# Patient Record
Sex: Female | Born: 1955 | Race: Black or African American | Hispanic: No | Smoking: Current every day smoker
Health system: Southern US, Community
[De-identification: ages and names within clinical notes are randomized; demographics above are authoritative.]

## PROBLEM LIST (undated history)

## (undated) DIAGNOSIS — I252 Old myocardial infarction: Secondary | ICD-10-CM

## (undated) DIAGNOSIS — F32A Depression, unspecified: Secondary | ICD-10-CM

## (undated) DIAGNOSIS — I255 Ischemic cardiomyopathy: Secondary | ICD-10-CM

## (undated) DIAGNOSIS — F1491 Cocaine use, unspecified, in remission: Secondary | ICD-10-CM

## (undated) DIAGNOSIS — B009 Herpesviral infection, unspecified: Secondary | ICD-10-CM

## (undated) DIAGNOSIS — F329 Major depressive disorder, single episode, unspecified: Secondary | ICD-10-CM

## (undated) DIAGNOSIS — Z87898 Personal history of other specified conditions: Secondary | ICD-10-CM

## (undated) DIAGNOSIS — Z9861 Coronary angioplasty status: Secondary | ICD-10-CM

## (undated) DIAGNOSIS — F1010 Alcohol abuse, uncomplicated: Secondary | ICD-10-CM

## (undated) DIAGNOSIS — I2510 Atherosclerotic heart disease of native coronary artery without angina pectoris: Secondary | ICD-10-CM

## (undated) DIAGNOSIS — I10 Essential (primary) hypertension: Secondary | ICD-10-CM

## (undated) DIAGNOSIS — K219 Gastro-esophageal reflux disease without esophagitis: Secondary | ICD-10-CM

## (undated) DIAGNOSIS — E785 Hyperlipidemia, unspecified: Secondary | ICD-10-CM

## (undated) DIAGNOSIS — J189 Pneumonia, unspecified organism: Secondary | ICD-10-CM

## (undated) HISTORY — DX: Alcohol abuse, uncomplicated: F10.10

## (undated) HISTORY — DX: Herpesviral infection, unspecified: B00.9

## (undated) HISTORY — DX: Essential (primary) hypertension: I10

## (undated) HISTORY — DX: Personal history of other specified conditions: Z87.898

## (undated) HISTORY — DX: Cocaine use, unspecified, in remission: F14.91

---

## 1981-04-16 HISTORY — PX: TUBAL LIGATION: SHX77

## 2010-03-14 ENCOUNTER — Emergency Department (HOSPITAL_COMMUNITY)
Admission: EM | Admit: 2010-03-14 | Discharge: 2010-03-14 | Payer: Self-pay | Source: Home / Self Care | Admitting: Emergency Medicine

## 2010-10-26 ENCOUNTER — Encounter: Payer: Self-pay | Admitting: *Deleted

## 2010-10-26 ENCOUNTER — Emergency Department (HOSPITAL_COMMUNITY)
Admission: EM | Admit: 2010-10-26 | Discharge: 2010-10-26 | Disposition: A | Discharge status: Home / Self Care | Payer: Self-pay | Attending: Emergency Medicine | Admitting: Emergency Medicine

## 2010-10-26 DIAGNOSIS — F17200 Nicotine dependence, unspecified, uncomplicated: Secondary | ICD-10-CM | POA: Insufficient documentation

## 2010-10-26 DIAGNOSIS — N949 Unspecified condition associated with female genital organs and menstrual cycle: Secondary | ICD-10-CM

## 2010-10-26 DIAGNOSIS — N898 Other specified noninflammatory disorders of vagina: Secondary | ICD-10-CM | POA: Insufficient documentation

## 2010-10-26 DIAGNOSIS — I10 Essential (primary) hypertension: Secondary | ICD-10-CM | POA: Insufficient documentation

## 2010-10-26 LAB — URINALYSIS, ROUTINE W REFLEX MICROSCOPIC
Glucose, UA: NEGATIVE mg/dL
Leukocytes, UA: NEGATIVE
Protein, ur: NEGATIVE mg/dL
Specific Gravity, Urine: 1.02 (ref 1.005–1.030)
pH: 5.5 (ref 5.0–8.0)

## 2010-10-26 LAB — BASIC METABOLIC PANEL
CO2: 27 mEq/L (ref 19–32)
Calcium: 9.5 mg/dL (ref 8.4–10.5)
Chloride: 102 mEq/L (ref 96–112)
Glucose, Bld: 85 mg/dL (ref 70–99)
Potassium: 3.8 mEq/L (ref 3.5–5.1)
Sodium: 139 mEq/L (ref 135–145)

## 2010-10-26 LAB — CBC
Hemoglobin: 13.9 g/dL (ref 12.0–15.0)
Platelets: 193 10*3/uL (ref 150–400)
RBC: 4.3 MIL/uL (ref 3.87–5.11)
WBC: 4.8 10*3/uL (ref 4.0–10.5)

## 2010-10-26 LAB — URINE MICROSCOPIC-ADD ON

## 2010-10-26 LAB — POCT PREGNANCY, URINE: Preg Test, Ur: NEGATIVE

## 2010-10-26 MED ORDER — SODIUM CHLORIDE 0.9 % IJ SOLN
INTRAMUSCULAR | Status: AC
  Administered 2010-10-26: 10:00:00
  Filled 2010-10-26: qty 10

## 2010-10-26 MED ORDER — HYDROCHLOROTHIAZIDE 25 MG PO TABS: 25.0000 mg | ORAL_TABLET | Freq: Every day | ORAL | Status: DC

## 2010-10-26 NOTE — ED Notes (Signed)
Vaginal bright red bleeding x 2 days.  Denies bleeding at this time.  States bleeding is abnormal.  Also, states had friend check bp x 2 days ago and was elevated.  Has hx of htn but does not take meds.

## 2010-10-26 NOTE — ED Notes (Signed)
An in and out was performed. Straight quick cath, clear clean specimen. Performed by Stephens Shire NT assisted by Waynetta Sandy NT.

## 2010-10-26 NOTE — ED Provider Notes (Signed)
History     Chief Complaint  Patient presents with   Hypertension   Vaginal Bleeding   HPI  Past Medical History  Diagnosis Date   Hypertension     Past Surgical History  Procedure Date   Tubal ligation     History reviewed. No pertinent family history.  History  Substance Use Topics   Smoking status: Current Everyday Smoker -- 0.5 packs/day    Types: Cigarettes   Smokeless tobacco: Not on file   Alcohol Use: Yes     occasional    OB History    Grav Para Term Preterm Abortions TAB SAB Ect Mult Living                  Review of Systems  Physical Exam  BP 181/82   Pulse 60   Temp(Src) 98.2 F (36.8 C) (Oral)   Resp 18   Ht 4\' 11"  (1.499 m)   Wt 105 lb (47.628 kg)   BMI 21.21 kg/m2   SpO2 99%  Physical Exam  ED Course  Procedures  MDM Pt seen by Dr. Adriana Simas; c/o BP problem because ran out of meds, reports high BP measured 2 days ago, unsure of exact numbers. Also c/o small amount vaginal bleeding for a few days which is unusual for pt. No abd pain or back pain.

## 2010-10-26 NOTE — ED Provider Notes (Signed)
History     Chief Complaint  Patient presents with  . Hypertension  . Vaginal Bleeding   Patient is a 55 y.o. female presenting with hypertension and vaginal bleeding. The history is provided by the patient.  Hypertension This is a chronic problem. The problem occurs daily. The problem has been gradually worsening. Associated symptoms include headaches, nausea and vertigo. Pertinent negatives include no abdominal pain, arthralgias, chest pain, coughing or neck pain. She has tried nothing for the symptoms.  Vaginal Bleeding Associated symptoms include headaches, nausea and vertigo. Pertinent negatives include no abdominal pain, arthralgias, chest pain, coughing or neck pain.  Hypertension This is a chronic problem. The problem occurs daily. The problem has been gradually worsening. Associated symptoms include headaches. Pertinent negatives include no chest pain, no abdominal pain and no shortness of breath. She has tried nothing for the symptoms.  Vaginal Bleeding Associated symptoms include headaches. Pertinent negatives include no chest pain, no abdominal pain and no shortness of breath.    Past Medical History  Diagnosis Date  . Hypertension     Past Surgical History  Procedure Date  . Tubal ligation     History reviewed. No pertinent family history.  History  Substance Use Topics  . Smoking status: Current Everyday Smoker -- 0.5 packs/day    Types: Cigarettes  . Smokeless tobacco: Not on file  . Alcohol Use: Yes     occasional    OB History    Grav Para Term Preterm Abortions TAB SAB Ect Mult Living                  Review of Systems  Constitutional: Negative for activity change.       All ROS Neg except as noted in HPI  HENT: Negative for nosebleeds and neck pain.   Eyes: Negative for photophobia and discharge.  Respiratory: Negative for cough, shortness of breath and wheezing.   Cardiovascular: Negative for chest pain and palpitations.  Gastrointestinal:  Positive for nausea. Negative for abdominal pain and blood in stool.  Genitourinary: Positive for vaginal bleeding. Negative for dysuria, frequency and hematuria.  Musculoskeletal: Negative for back pain and arthralgias.  Skin: Negative.   Neurological: Positive for dizziness, vertigo and headaches. Negative for seizures and speech difficulty.  Hematological: Does not bruise/bleed easily.  Psychiatric/Behavioral: Negative for hallucinations and confusion.    Physical Exam  BP 191/114  Pulse 70  Temp(Src) 98.2 F (36.8 C) (Oral)  Resp 18  Ht 4\' 11"  (1.499 m)  Wt 105 lb (47.628 kg)  BMI 21.21 kg/m2  SpO2 100%  Physical Exam  Nursing note and vitals reviewed. Constitutional: She is oriented to person, place, and time. She appears well-developed and well-nourished.  Non-toxic appearance.  HENT:  Head: Normocephalic.  Right Ear: Tympanic membrane and external ear normal.  Left Ear: Tympanic membrane and external ear normal.  Eyes: EOM and lids are normal. Pupils are equal, round, and reactive to light.  Neck: Normal range of motion. Neck supple. Carotid bruit is not present.  Cardiovascular: Normal rate, regular rhythm, normal heart sounds, intact distal pulses and normal pulses.   Pulmonary/Chest: Breath sounds normal. No respiratory distress.  Abdominal: Soft. Bowel sounds are normal. There is no tenderness. There is no guarding.  Genitourinary:       No vaginal bleeding at this time  Musculoskeletal: Normal range of motion.  Lymphadenopathy:       Head (right side): No submandibular adenopathy present.       Head (left  side): No submandibular adenopathy present.    She has no cervical adenopathy.  Neurological: She is alert and oriented to person, place, and time. She has normal strength. No cranial nerve deficit or sensory deficit. Coordination normal.       Speech clear. Grip symmetrical.   Skin: Skin is warm and dry.  Psychiatric: She has a normal mood and affect. Her  speech is normal.    ED Course  Procedures  I have reviewed nursing notes, vital signs, and all appropriate lab and imaging results for this patient.      Kathie Dike, [place] 10/27/10 2136  Donnetta Hutching, MD 11/24/10 1149

## 2010-10-26 NOTE — ED Provider Notes (Signed)
History     Chief Complaint  Patient presents with  . Hypertension  . Vaginal Bleeding   HPI  Past Medical History  Diagnosis Date  . Hypertension     Past Surgical History  Procedure Date  . Tubal ligation     History reviewed. No pertinent family history.  History  Substance Use Topics  . Smoking status: Current Everyday Smoker -- 0.5 packs/day    Types: Cigarettes  . Smokeless tobacco: Not on file  . Alcohol Use: Yes     occasional    OB History    Grav Para Term Preterm Abortions TAB SAB Ect Mult Living                  Review of Systems  Physical Exam  BP 181/82  Pulse 60  Temp(Src) 98.2 F (36.8 C) (Oral)  Resp 18  Ht 4\' 11"  (1.499 m)  Wt 105 lb (47.628 kg)  BMI 21.21 kg/m2  SpO2 99%  Physical Exam  ED Course  Procedures  MDM See below     I performed a history and physical examination of Joanna Farmer and discussed her management with Dr. .  I agree with the history, physical, assessment, and plan of care, with the following exceptions: None  I was present for the following procedures: None Time Spent in Critical Care of the patient: None Time spent in discussions with the patient and family: 5 min  Sherman Donaldson Seen by me:  bp noted to be elevated;  Also c/o min vag bleeding.  Hemodynamically elevated;  No neuro def;  No acute abd;  D/c info per PA  Donnetta Hutching, MD 11/24/10 1425

## 2011-07-10 ENCOUNTER — Encounter (HOSPITAL_COMMUNITY): Payer: Self-pay | Admitting: *Deleted

## 2011-07-10 ENCOUNTER — Emergency Department (HOSPITAL_COMMUNITY)
Admission: EM | Admit: 2011-07-10 | Discharge: 2011-07-10 | Disposition: A | Discharge status: Home / Self Care | Payer: Self-pay | Attending: Emergency Medicine | Admitting: Emergency Medicine

## 2011-07-10 DIAGNOSIS — R51 Headache: Secondary | ICD-10-CM | POA: Insufficient documentation

## 2011-07-10 DIAGNOSIS — M545 Low back pain, unspecified: Secondary | ICD-10-CM | POA: Insufficient documentation

## 2011-07-10 DIAGNOSIS — G8929 Other chronic pain: Secondary | ICD-10-CM | POA: Insufficient documentation

## 2011-07-10 DIAGNOSIS — I10 Essential (primary) hypertension: Secondary | ICD-10-CM | POA: Insufficient documentation

## 2011-07-10 DIAGNOSIS — F17200 Nicotine dependence, unspecified, uncomplicated: Secondary | ICD-10-CM | POA: Insufficient documentation

## 2011-07-10 MED ORDER — HYDROCODONE-ACETAMINOPHEN 5-325 MG PO TABS
2.0000 | ORAL_TABLET | Freq: Once | ORAL | Status: AC
  Administered 2011-07-10: 2 via ORAL
  Filled 2011-07-10: qty 2

## 2011-07-10 MED ORDER — HYDROCODONE-ACETAMINOPHEN 5-325 MG PO TABS: 1.0000 | ORAL_TABLET | ORAL | Status: AC | PRN

## 2011-07-10 MED ORDER — ONDANSETRON HCL 4 MG PO TABS
4.0000 mg | ORAL_TABLET | Freq: Once | ORAL | Status: AC
  Administered 2011-07-10: 4 mg via ORAL
  Filled 2011-07-10: qty 1

## 2011-07-10 MED ORDER — HYDROCHLOROTHIAZIDE 12.5 MG PO TABS: 25.0000 mg | ORAL_TABLET | Freq: Every day | ORAL | Status: DC

## 2011-07-10 NOTE — ED Provider Notes (Signed)
History     CSN: [id number]  Arrival date & time 07/10/11  1116   None     Chief Complaint  Patient presents with  . Back Pain  . Headache    (Consider location/radiation/quality/duration/timing/severity/associated sxs/prior treatment) Patient is a 56 y.o. female presenting with back pain and headaches. The history is provided by the patient.  Back Pain  This is a chronic problem. The problem occurs daily. The problem has been gradually worsening. The pain is associated with no known injury. The pain is present in the lumbar spine. The quality of the pain is described as aching. The pain radiates to the right thigh. The pain is severe. The symptoms are aggravated by certain positions. The pain is the same all the time. Associated symptoms include headaches. Pertinent negatives include no chest pain, no numbness, no abdominal pain, no bowel incontinence, no perianal numbness, no bladder incontinence and no dysuria. She has tried nothing for the symptoms.  Headache  Pertinent negatives include no palpitations and no shortness of breath.    Past Medical History  Diagnosis Date  . Hypertension     Past Surgical History  Procedure Date  . Tubal ligation     No family history on file.  History  Substance Use Topics  . Smoking status: Current Everyday Smoker -- 0.5 packs/day    Types: Cigarettes  . Smokeless tobacco: Not on file  . Alcohol Use: Yes     occasional    OB History    Grav Para Term Preterm Abortions TAB SAB Ect Mult Living                  Review of Systems  Constitutional: Negative for activity change.       All ROS Neg except as noted in HPI  HENT: Negative for nosebleeds and neck pain.   Eyes: Negative for photophobia and discharge.  Respiratory: Negative for cough, shortness of breath and wheezing.   Cardiovascular: Negative for chest pain and palpitations.  Gastrointestinal: Negative for abdominal pain, blood in stool and bowel incontinence.    Genitourinary: Negative for bladder incontinence, dysuria, frequency and hematuria.  Musculoskeletal: Positive for back pain. Negative for arthralgias.  Skin: Negative.   Neurological: Positive for headaches. Negative for dizziness, seizures, speech difficulty and numbness.  Psychiatric/Behavioral: Negative for hallucinations and confusion.    Allergies  Review of patient's allergies indicates no known allergies.  Home Medications  No current outpatient prescriptions on file.  BP 189/105  Pulse 98  Temp(Src) 98.2 F (36.8 C) (Oral)  Resp 16  Ht 4\' 11"  (1.499 m)  Wt 102 lb (46.267 kg)  BMI 20.60 kg/m2  SpO2 98%  Physical Exam  Nursing note and vitals reviewed. Constitutional: She is oriented to person, place, and time. She appears well-developed and well-nourished.  Non-toxic appearance.  HENT:  Head: Normocephalic.  Right Ear: Tympanic membrane and external ear normal.  Left Ear: Tympanic membrane and external ear normal.  Eyes: EOM and lids are normal. Pupils are equal, round, and reactive to light.  Neck: Normal range of motion. Neck supple. Carotid bruit is not present.  Cardiovascular: Normal rate, regular rhythm, normal heart sounds, intact distal pulses and normal pulses.   Pulmonary/Chest: Breath sounds normal. No respiratory distress.  Abdominal: Soft. Bowel sounds are normal. There is no tenderness. There is no guarding.  Musculoskeletal: Normal range of motion.       There is pain to palpation from the mid back to the lower lumbar  area. A right area of pain is greater than left. There is pain also to attempted range of motion of the lower back.  Lymphadenopathy:       Head (right side): No submandibular adenopathy present.       Head (left side): No submandibular adenopathy present.    She has no cervical adenopathy.  Neurological: She is alert and oriented to person, place, and time. She has normal strength. No cranial nerve deficit or sensory deficit. She  exhibits normal muscle tone. Coordination normal.  Skin: Skin is warm and dry.  Psychiatric: She has a normal mood and affect. Her speech is normal.    ED Course  Procedures (including critical care time)  Labs Reviewed - No data to display No results found.   No diagnosis found.    MDM  I have reviewed nursing notes, vital signs, and all appropriate lab and imaging results for this patient. Patient has a long-term history of lower back pain. Over the last 2 weeks the pain has gotten worse. Patient also expressed complaining of a headache that is" off and on during the day. The patient has a history of hypertension but states she cannot afford her medication and has not taken this medication" and while". No gross neurologic changes on examination today. Patient treated with Norco 5 mg, one or 2 every 4 hours as needed for pain #15.      Kathie Dike, [place] 07/10/11 1300

## 2011-07-10 NOTE — ED Notes (Signed)
Patient c/o intermittent headache, lower back pain x 2 weeks and left leg pain x 2 weeks. Describes leg pain as a numbness. Denies injury to back. HTN noted, patient not taking BP meds.

## 2011-07-10 NOTE — ED Provider Notes (Signed)
Medical screening examination/treatment/procedure(s) were performed by non-physician practitioner and as supervising physician I was immediately available for consultation/collaboration. Angelys Yetman, MD, FACEP   Monta Maiorana L Kien Mirsky, MD 07/10/11 2011 

## 2011-07-10 NOTE — ED Notes (Signed)
Patient with no complaints at this time. Respirations even and unlabored. Skin warm/dry. Discharge instructions reviewed with patient at this time. Patient given opportunity to voice concerns/ask questions. Patient discharged at this time and left Emergency Department with steady gait.   

## 2011-07-10 NOTE — Discharge Instructions (Signed)
Chronic Back Pain When back pain lasts longer than 3 months, it is called chronic back pain.This pain can be frustrating, but the cause of the pain is rarely dangerous.People with chronic back pain often go through certain periods that are more intense (flare-ups). CAUSES Chronic back pain can be caused by wear and tear (degeneration) on different structures in your back. These structures may include bones, ligaments, or discs. This degeneration may result in more pressure being placed on the nerves that travel to your legs and feet. This can lead to pain traveling from the low back down the back of the legs. When pain lasts longer than 3 months, it is not unusual for people to experience anxiety or depression. Anxiety and depression can also contribute to low back pain. TREATMENT  Establish a regular exercise plan. This is critical to improving your functional level.   Have a self-management plan for when you flare-up. Flare-ups rarely require a medical visit. Regular exercise will help reduce the intensity and frequency of your flare-ups.   Manage how you feel about your back pain and the rest of your life. Anxiety, depression, and feeling that you cannot alter your back pain have been shown to make back pain more intense and debilitating.   Medicines should never be your only treatment. They should be used along with other treatments to help you return to a more active lifestyle.   Procedures such as injections or surgery may be helpful but are rarely necessary. You may be able to get the same results with physical therapy or chiropractic care.  HOME CARE INSTRUCTIONS  Avoid bending, heavy lifting, prolonged sitting, and activities which make the problem worse.   Continue normal activity as much as possible.   Take brief periods of rest throughout the day to reduce your pain during flare-ups.   Follow your back exercise rehabilitation program. This can help reduce symptoms and prevent  more pain.   Only take over-the-counter or prescription medicines as directed by your caregiver. Muscle relaxants are sometimes prescribed. Narcotic pain medicine is discouraged for long-term pain, since addiction is a possible outcome.   If you smoke, quit.   Eat healthy foods and maintain a recommended body weight.  SEEK IMMEDIATE MEDICAL CARE IF:   You have weakness or numbness in one of your legs or feet.   You have trouble controlling your bladder or bowels.   You develop nausea, vomiting, abdominal pain, shortness of breath, or fainting.  Document Released: 05/10/2004 Document Revised: 03/22/2011 Document Reviewed: 03/17/2011 [facility] Patient Information 2012 [place], [place].Headache Headaches are caused by many different problems. Most commonly, headache is caused by muscle tension from an injury, fatigue, or emotional upset. Excessive muscle contractions in the scalp and neck result in a headache that often feels like a tight band around the head. Tension headaches often have areas of tenderness over the scalp and the back of the neck. These headaches may last for hours, days, or longer, and some may contribute to migraines in those who have migraine problems. Migraines usually cause a throbbing headache, which is made worse by activity. Sometimes only one side of the head hurts. Nausea, vomiting, eye pain, and avoidance of food are common with migraines. Visual symptoms such as light sensitivity, blind spots, or flashing lights may also occur. Loud noises may worsen migraine headaches. Many factors may cause migraine headaches:  Emotional stress, lack of sleep, and menstrual periods.   Alcohol and some drugs (such as birth control pills).  Diet factors (fasting, caffeine, food preservatives, chocolate).   Environmental factors (weather changes, bright lights, odors, smoke).  Other causes of headaches include minor injuries to the head. Arthritis in the neck; problems with the jaw,  eyes, ears, or nose are also causes of headaches. Allergies, drugs, alcohol, and exposure to smoke can also cause moderate headaches. Rebound headaches can occur if someone uses pain medications for a long period of time and then stops. Less commonly, blood vessel problems in the neck and brain (including stroke) can cause various types of headache. Treatment of headaches includes medicines for pain and relaxation. Ice packs or heat applied to the back of the head and neck help some people. Massaging the shoulders, neck and scalp are often very useful. Relaxation techniques and stretching can help prevent these headaches. Avoid alcohol and cigarette smoking as these tend to make headaches worse. Please see your caregiver if your headache is not better in 2 days.  SEEK IMMEDIATE MEDICAL CARE IF:   You develop a high fever, chills, or repeated vomiting.   You faint or have difficulty with vision.   You develop unusual numbness or weakness of your arms or legs.   Relief of pain is inadequate with medication, or you develop severe pain.   You develop confusion, or neck stiffness.   You have a worsening of a headache or do not obtain relief.  Document Released: 04/02/2005 Document Revised: 03/22/2011 Document Reviewed: 09/26/2006 [facility] Patient Information 2012 [place], [place].Hypertension Hypertension is another name for high blood pressure. High blood pressure may mean that your heart needs to work harder to pump blood. Blood pressure consists of two numbers, which includes a higher number over a lower number (example: 110/72). HOME CARE   Make lifestyle changes as told by your doctor. This may include weight loss and exercise.   Take your blood pressure medicine every day.   Limit how much salt you use.   Stop smoking if you smoke.   Do not use drugs.   Talk to your doctor if you are using decongestants or birth control pills. These medicines might make blood pressure higher.    Females should not drink more than 1 alcoholic drink per day. Males should not drink more than 2 alcoholic drinks per day.   See your doctor as told.  GET HELP RIGHT AWAY IF:   You have a blood pressure reading with a top number of 180 or higher.   You get a very bad headache.   You get blurred or changing vision.   You feel confused.   You feel weak, numb, or faint.   You get chest or belly (abdominal) pain.   You throw up (vomit).   You cannot breathe very well.  MAKE SURE YOU:   Understand these instructions.   Will watch your condition.   Will get help right away if you are not doing well or get worse.  Document Released: 09/19/2007 Document Revised: 03/22/2011 Document Reviewed: 09/19/2007 [facility] Patient Information 2012 [place], [place].

## 2011-07-10 NOTE — ED Notes (Signed)
Pt presents with lower right back pain that radiates into leg x 2 weeks. Also c/o headache "off and on for days". Denies fall/injury, vision changes and weakness. Pt states has hypertension and cannot afford medication.

## 2013-04-16 HISTORY — PX: CORONARY ANGIOPLASTY WITH STENT PLACEMENT: SHX49

## 2013-12-30 ENCOUNTER — Emergency Department (HOSPITAL_COMMUNITY)
Admission: EM | Admit: 2013-12-30 | Discharge: 2013-12-30 | Disposition: A | Discharge status: Other Acute Inpatient Hospital | Payer: Self-pay | Attending: Emergency Medicine | Admitting: Emergency Medicine

## 2013-12-30 ENCOUNTER — Encounter (HOSPITAL_COMMUNITY): Payer: Self-pay | Admitting: Emergency Medicine

## 2013-12-30 ENCOUNTER — Emergency Department (HOSPITAL_COMMUNITY): Payer: Self-pay

## 2013-12-30 DIAGNOSIS — Z8659 Personal history of other mental and behavioral disorders: Secondary | ICD-10-CM | POA: Insufficient documentation

## 2013-12-30 DIAGNOSIS — Z008 Encounter for other general examination: Secondary | ICD-10-CM | POA: Insufficient documentation

## 2013-12-30 DIAGNOSIS — F1010 Alcohol abuse, uncomplicated: Secondary | ICD-10-CM | POA: Insufficient documentation

## 2013-12-30 DIAGNOSIS — I10 Essential (primary) hypertension: Secondary | ICD-10-CM | POA: Insufficient documentation

## 2013-12-30 DIAGNOSIS — F17200 Nicotine dependence, unspecified, uncomplicated: Secondary | ICD-10-CM | POA: Insufficient documentation

## 2013-12-30 DIAGNOSIS — F1410 Cocaine abuse, uncomplicated: Secondary | ICD-10-CM | POA: Insufficient documentation

## 2013-12-30 DIAGNOSIS — F39 Unspecified mood [affective] disorder: Secondary | ICD-10-CM | POA: Insufficient documentation

## 2013-12-30 HISTORY — DX: Depression, unspecified: F32.A

## 2013-12-30 HISTORY — DX: Major depressive disorder, single episode, unspecified: F32.9

## 2013-12-30 LAB — CBC WITH DIFFERENTIAL/PLATELET
BASOS ABS: 0 10*3/uL (ref 0.0–0.1)
Basophils Relative: 0 % (ref 0–1)
Eosinophils Absolute: 0 10*3/uL (ref 0.0–0.7)
Eosinophils Relative: 1 % (ref 0–5)
HCT: 38.5 % (ref 36.0–46.0)
Hemoglobin: 12.9 g/dL (ref 12.0–15.0)
LYMPHS ABS: 2 10*3/uL (ref 0.7–4.0)
LYMPHS PCT: 45 % (ref 12–46)
MCH: 31.5 pg (ref 26.0–34.0)
MCHC: 33.5 g/dL (ref 30.0–36.0)
MCV: 93.9 fL (ref 78.0–100.0)
Monocytes Absolute: 0.3 10*3/uL (ref 0.1–1.0)
Monocytes Relative: 8 % (ref 3–12)
NEUTROS ABS: 2.1 10*3/uL (ref 1.7–7.7)
Neutrophils Relative %: 46 % (ref 43–77)
PLATELETS: 218 10*3/uL (ref 150–400)
RBC: 4.1 MIL/uL (ref 3.87–5.11)
RDW: 13.9 % (ref 11.5–15.5)
WBC: 4.4 10*3/uL (ref 4.0–10.5)

## 2013-12-30 LAB — URINE MICROSCOPIC-ADD ON

## 2013-12-30 LAB — BASIC METABOLIC PANEL
ANION GAP: 16 — AB (ref 5–15)
BUN: 16 mg/dL (ref 6–23)
CHLORIDE: 106 meq/L (ref 96–112)
CO2: 24 meq/L (ref 19–32)
Calcium: 9.3 mg/dL (ref 8.4–10.5)
Creatinine, Ser: 0.98 mg/dL (ref 0.50–1.10)
GFR calc Af Amer: 72 mL/min — ABNORMAL LOW (ref >90)
GFR calc non Af Amer: 62 mL/min — ABNORMAL LOW (ref >90)
Glucose, Bld: 150 mg/dL — ABNORMAL HIGH (ref 70–99)
POTASSIUM: 3.6 meq/L — AB (ref 3.7–5.3)
SODIUM: 146 meq/L (ref 137–147)

## 2013-12-30 LAB — RAPID URINE DRUG SCREEN, HOSP PERFORMED
AMPHETAMINES: NOT DETECTED
BARBITURATES: NOT DETECTED
BENZODIAZEPINES: NOT DETECTED
Cocaine: POSITIVE — AB
Opiates: NOT DETECTED
Tetrahydrocannabinol: NOT DETECTED

## 2013-12-30 LAB — URINALYSIS, ROUTINE W REFLEX MICROSCOPIC
BILIRUBIN URINE: NEGATIVE
GLUCOSE, UA: NEGATIVE mg/dL
Hgb urine dipstick: NEGATIVE
KETONES UR: NEGATIVE mg/dL
NITRITE: NEGATIVE
PH: 6 (ref 5.0–8.0)
PROTEIN: NEGATIVE mg/dL
Specific Gravity, Urine: <1.005 — ABNORMAL LOW (ref 1.005–1.030)
Urobilinogen, UA: 0.2 mg/dL (ref 0.0–1.0)

## 2013-12-30 LAB — ETHANOL: ALCOHOL ETHYL (B): 147 mg/dL — AB (ref 0–11)

## 2013-12-30 LAB — TROPONIN I

## 2013-12-30 MED ORDER — CLONIDINE HCL 0.1 MG PO TABS
0.1000 mg | ORAL_TABLET | Freq: Once | ORAL | Status: AC
  Administered 2013-12-30: 0.1 mg via ORAL
  Filled 2013-12-30: qty 1

## 2013-12-30 NOTE — ED Notes (Signed)
TTS in progress 

## 2013-12-30 NOTE — ED Notes (Signed)
[facility] notified of need to transport pt.

## 2013-12-30 NOTE — BH Assessment (Signed)
Tele Assessment Note   Joanna Farmer is a 58 y.o. female who voluntarily presents to [facility] for alcohol/cocaine/thc detox.  Pt denies SI/HI/AVH, however she admits past SI attempt by cutting her wrists(10 yrs ago).  Pt reports she is consuming 15+ 16-24oz beers, daily,  Her last drink(1-16oz) was today.  Pt uses $40 worth of cocaine at least 2x's a week and last use was on 12/27/13 and she occasionally uses 1 marijuana joint. She last used marijuana was 12/27/13.  Pt denies any seizure/blackout activity, no legal issues.       Axis I: Alcohol Use D/O, Severe; Cocaine Use D/O; Cannabis Use D/O  Axis II: Deferred Axis III:  Past Medical History  Diagnosis Date  . Hypertension   . Depression    Axis IV: other psychosocial or environmental problems, problems related to social environment and problems with primary support group Axis V: 51-60 moderate symptoms  Past Medical History:  Past Medical History  Diagnosis Date  . Hypertension   . Depression     Past Surgical History  Procedure Laterality Date  . Tubal ligation      Family History: No family history on file.  Social History:  reports that she has been smoking Cigarettes.  She has been smoking about 0.50 packs per day. She does not have any smokeless tobacco history on file. She reports that she drinks alcohol. She reports that she uses illicit drugs (Marijuana and Cocaine).  Additional Social History:  Alcohol / Drug Use Pain Medications: None  Prescriptions: None  Over the Counter: None  History of alcohol / drug use?: Yes Longest period of sobriety (when/how long): No previous detox  Negative Consequences of Use: Work / School;Personal relationships;Financial Withdrawal Symptoms: Other (Comment) (No current w/d sxs ) Substance #1 Name of Substance 1: Alcohol  1 - Age of First Use: Teens  1 - Amount (size/oz): 15+ 16-24oz Beers  1 - Frequency: Daily  1 - Duration: On-going  1 - Last Use / Amount: 12/30/13 Substance  #2 Name of Substance 2: Cocaine  2 - Age of First Use: 20's  2 - Amount (size/oz): $40 2 - Frequency: 2x's Wkly  2 - Duration: On-going  2 - Last Use / Amount: 12/27/13 Substance #3 Name of Substance 3: THC  3 - Age of First Use: Teens  3 - Amount (size/oz): 1 Joint  3 - Frequency: Occasional  3 - Duration: On-going  3 - Last Use / Amount: 12/27/13  CIWA: CIWA-Ar BP: 154/95 mmHg Pulse Rate: 85 Nausea and Vomiting: no nausea and no vomiting Tactile Disturbances: none Tremor: no tremor Auditory Disturbances: not present Paroxysmal Sweats: no sweat visible Visual Disturbances: not present Anxiety: no anxiety, at ease Headache, Fullness in Head: none present Agitation: normal activity Orientation and Clouding of Sensorium: oriented and can do serial additions CIWA-Ar Total: 0 COWS:    PATIENT STRENGTHS: (choose at least two) Communication skills  Allergies: No Known Allergies  Home Medications:  (Not in a hospital admission)  OB/GYN Status:  No LMP recorded. Patient is postmenopausal.  General Assessment Data Location of Assessment: [facility] Is this a Tele or Face-to-Face Assessment?: Tele Assessment Is this an Initial Assessment or a Re-assessment for this encounter?: Initial Assessment Living Arrangements: Alone Can pt return to current living arrangement?: Yes Admission Status: Voluntary Is patient capable of signing voluntary admission?: Yes Transfer from: Home Referral Source: Self/Family/Friend  Medical Screening Exam ([place]) Medical Exam completed: No Reason for MSE not completed:  Other: (None )  [place] Living Arrangements: Alone Name of Psychiatrist: None  Name of Therapist: None   Education Status Is patient currently in school?: No Current Grade: None  Highest grade of school patient has completed: None  Name of school: None  Contact person: None   Risk to self with the past 6 months Suicidal Ideation: No Suicidal  Intent: No Is patient at risk for suicide?: No Suicidal Plan?: No Access to Means: No What has been your use of drugs/alcohol within the last 12 months?: Abusing: alcohol, cocaine, thc  Previous Attempts/Gestures: Yes How many times?: 1 Other Self Harm Risks: None  Triggers for Past Attempts: None known Intentional Self Injurious Behavior: None Family Suicide History: No Recent stressful life event(s): Other (Comment) (Chronic SA ) Persecutory voices/beliefs?: No Depression: Yes Depression Symptoms: Loss of interest in usual pleasures;Feeling worthless/self pity Substance abuse history and/or treatment for substance abuse?: Yes Suicide prevention information given to non-admitted patients: Not applicable  Risk to Others within the past 6 months Homicidal Ideation: No Thoughts of Harm to Others: No Current Homicidal Intent: No Current Homicidal Plan: No Access to Homicidal Means: No Identified Victim: None  History of harm to others?: No Assessment of Violence: None Noted Violent Behavior Description: None  Does patient have access to weapons?: No Criminal Charges Pending?: No Does patient have a court date: No  Psychosis Hallucinations: None noted Delusions: None noted  Mental Status Report Appear/Hygiene: Disheveled Eye Contact: Good Motor Activity: Unremarkable Speech: Logical/coherent;Soft Level of Consciousness: Alert Mood: Depressed Affect: Depressed Anxiety Level: None Thought Processes: Coherent;Relevant Judgement: Unimpaired Orientation: Person;Place;Time;Situation Obsessive Compulsive Thoughts/Behaviors: None  Cognitive Functioning Concentration: Normal Memory: Recent Intact;Remote Intact IQ: Average Insight: Fair Impulse Control: Fair Appetite: Good Weight Loss: 0 Weight Gain: 0 Sleep: No Change Total Hours of Sleep: 6 Vegetative Symptoms: None  ADLScreening [facility] Assessment Services) Patient's cognitive ability adequate to safely complete  daily activities?: Yes Patient able to express need for assistance with ADLs?: Yes Independently performs ADLs?: Yes (appropriate for developmental age)  Prior Inpatient Therapy Prior Inpatient Therapy: No Prior Therapy Dates: None  Prior Therapy Facilty/Provider(s): None  Reason for Treatment: None  Prior Outpatient Therapy Prior Outpatient Therapy: No Prior Therapy Dates: None  Prior Therapy Facilty/Provider(s): None  Reason for Treatment: None   ADL Screening (condition at time of admission) Patient's cognitive ability adequate to safely complete daily activities?: Yes Is the patient deaf or have difficulty hearing?: No Does the patient have difficulty seeing, even when wearing glasses/contacts?: No Does the patient have difficulty concentrating, remembering, or making decisions?: No Patient able to express need for assistance with ADLs?: Yes Does the patient have difficulty dressing or bathing?: No Independently performs ADLs?: Yes (appropriate for developmental age) Does the patient have difficulty walking or climbing stairs?: No Weakness of Legs: None Weakness of Arms/Hands: None  Home Assistive Devices/Equipment Home Assistive Devices/Equipment: None  Therapy Consults (therapy consults require a physician order) PT Evaluation Needed: No OT Evalulation Needed: No SLP Evaluation Needed: No Abuse/Neglect Assessment (Assessment to be complete while patient is alone) Physical Abuse: Denies Verbal Abuse: Denies Sexual Abuse: Denies Exploitation of patient/patient's resources: Denies Self-Neglect: Denies Values / Beliefs Cultural Requests During Hospitalization: None Spiritual Requests During Hospitalization: None Consults Spiritual Care Consult Needed: No Social Work Consult Needed: No Regulatory affairs officer (For Healthcare) Does patient have an advance directive?: No Would patient like information on creating an advanced directive?: No - patient declined  information Nutrition Screen- [facility] Patient's  home diet: Regular  Additional Information 1:1 In Past 12 Months?: No CIRT Risk: No Elopement Risk: No Does patient have medical clearance?: Yes     Disposition:  Disposition Initial Assessment Completed for this Encounter: Yes Disposition of Patient: Inpatient treatment program Waylan Boga, NP , accepted [id number]) Type of inpatient treatment program: Adult  G[place]a Rm 12/30/2013 9:13 PM

## 2013-12-30 NOTE — ED Provider Notes (Signed)
CSN: [id number]     Arrival date & time 12/30/13  1304 History   First MD Initiated Contact with Patient 12/30/13 1339     Chief Complaint  Patient presents with  . detox      (Consider location/radiation/quality/duration/timing/severity/associated sxs/prior Treatment) Patient is a 58 y.o. female presenting with altered mental status. The history is provided by the patient (the pt wants detox from etoh and cocaine).  Altered Mental Status Presenting symptoms: no behavior changes   Severity:  Moderate Most recent episode:  More than 2 days ago Episode history:  Multiple Timing:  Constant Progression:  Worsening Chronicity:  Chronic Context: alcohol use   Associated symptoms: no abdominal pain, no hallucinations, no headaches, no rash and no seizures     Past Medical History  Diagnosis Date  . Hypertension   . Depression    Past Surgical History  Procedure Laterality Date  . Tubal ligation     No family history on file. History  Substance Use Topics  . Smoking status: Current Every Day Smoker -- 0.50 packs/day    Types: Cigarettes  . Smokeless tobacco: Not on file  . Alcohol Use: Yes     Comment: everyday   OB History   Grav Para Term Preterm Abortions TAB SAB Ect Mult Living                 Review of Systems  Constitutional: Negative for appetite change and fatigue.  HENT: Negative for congestion, ear discharge and sinus pressure.   Eyes: Negative for discharge.  Respiratory: Negative for cough.   Cardiovascular: Negative for chest pain.  Gastrointestinal: Negative for abdominal pain and diarrhea.  Genitourinary: Negative for frequency and hematuria.  Musculoskeletal: Negative for back pain.  Skin: Negative for rash.  Neurological: Negative for seizures and headaches.  Psychiatric/Behavioral: Positive for dysphoric mood. Negative for hallucinations.      Allergies  Review of patient's allergies indicates no known allergies.  Home Medications   Prior  to Admission medications   Not on File   BP 154/95  Pulse 85  Temp(Src) 97.9 F (36.6 C) (Oral)  Resp 20  Ht 4\' 11"  (1.499 m)  Wt 105 lb (47.628 kg)  BMI 21.20 kg/m2  SpO2 98% Physical Exam  Constitutional: She is oriented to person, place, and time. She appears well-developed.  HENT:  Head: Normocephalic.  Eyes: Conjunctivae and EOM are normal. No scleral icterus.  Neck: Neck supple. No thyromegaly present.  Cardiovascular: Normal rate and regular rhythm.  Exam reveals no gallop and no friction rub.   No murmur heard. Pulmonary/Chest: No stridor. She has no wheezes. She has no rales. She exhibits no tenderness.  Abdominal: She exhibits no distension. There is no tenderness. There is no rebound.  Musculoskeletal: Normal range of motion. She exhibits no edema.  Lymphadenopathy:    She has no cervical adenopathy.  Neurological: She is oriented to person, place, and time. She exhibits normal muscle tone. Coordination normal.  Skin: No rash noted. No erythema.  Psychiatric:  The pt is depressed not suicidal or homicidal    ED Course  Procedures (including critical care time) Labs Review Labs Reviewed  URINALYSIS, ROUTINE W REFLEX MICROSCOPIC - Abnormal; Notable for the following:    Specific Gravity, Urine <1.005 (*)    Leukocytes, UA SMALL (*)    All other components within normal limits  URINE RAPID DRUG SCREEN (HOSP PERFORMED) - Abnormal; Notable for the following:    Cocaine POSITIVE (*)  All other components within normal limits  BASIC METABOLIC PANEL - Abnormal; Notable for the following:    Potassium 3.6 (*)    Glucose, Bld 150 (*)    GFR calc non Af Amer 62 (*)    GFR calc Af Amer 72 (*)    Anion gap 16 (*)    All other components within normal limits  ETHANOL - Abnormal; Notable for the following:    Alcohol, Ethyl (B) 147 (*)    All other components within normal limits  URINE MICROSCOPIC-ADD ON - Abnormal; Notable for the following:    Squamous Epithelial  / LPF MANY (*)    Bacteria, UA MANY (*)    All other components within normal limits  CBC WITH DIFFERENTIAL  TROPONIN I    Imaging Review Dg Chest 2 View  12/30/2013   CLINICAL DATA:  Chest pain  EXAM: CHEST  2 VIEW  COMPARISON:  None.  FINDINGS: The cardiac shadow is mildly enlarged. No significant vascular congestion is seen. No focal infiltrate or sizable effusion is noted. No bony abnormality is noted.  IMPRESSION: No active cardiopulmonary disease.   Electronically Signed   By: Inez Catalina M.D.   On: 12/30/2013 14:36     EKG Interpretation None      MDM   Final diagnoses:  None        Maudry Diego, MD 12/30/13 2105

## 2013-12-30 NOTE — ED Notes (Addendum)
Pt states she drank 1 can of beer this morning. Pt alert and oriented. NAD noted. She also c/o of pain in her breasts and legs.

## 2013-12-30 NOTE — ED Notes (Addendum)
Patient states she wants to detox from alcohol and crack/cocaine use.  Patient states last alcohol consumption was yesterday; last cocaine/ marijuana use 2 days go.

## 2013-12-30 NOTE — ED Notes (Signed)
Spoke w/ pt's daughter per pt request to provide pt's daughter with plan of care for pt - pt's daughter requesting her contact information be provided to the next provider.   Daughter - Ethlyn Daniels [phone]  Please call daughter if needed, pt's daughter lives in [place] and unable to come see pt at this time.

## 2013-12-30 NOTE — ED Notes (Signed)
Pt denies being suicidal or homicidal. EDP aware and states pt does not need a sitter

## 2013-12-31 ENCOUNTER — Inpatient Hospital Stay (HOSPITAL_COMMUNITY)
Admission: EM | Admit: 2013-12-31 | Discharge: 2014-01-06 | DRG: 897 | Disposition: A | Discharge status: Home / Self Care | Payer: 59 | Source: Intra-hospital | Attending: Psychiatry | Admitting: Psychiatry

## 2013-12-31 ENCOUNTER — Encounter (HOSPITAL_COMMUNITY): Payer: Self-pay

## 2013-12-31 DIAGNOSIS — F17200 Nicotine dependence, unspecified, uncomplicated: Secondary | ICD-10-CM | POA: Diagnosis present

## 2013-12-31 DIAGNOSIS — F411 Generalized anxiety disorder: Secondary | ICD-10-CM | POA: Diagnosis present

## 2013-12-31 DIAGNOSIS — F1220 Cannabis dependence, uncomplicated: Secondary | ICD-10-CM | POA: Diagnosis present

## 2013-12-31 DIAGNOSIS — I10 Essential (primary) hypertension: Secondary | ICD-10-CM | POA: Diagnosis present

## 2013-12-31 DIAGNOSIS — F329 Major depressive disorder, single episode, unspecified: Secondary | ICD-10-CM | POA: Diagnosis present

## 2013-12-31 DIAGNOSIS — F3289 Other specified depressive episodes: Secondary | ICD-10-CM | POA: Diagnosis present

## 2013-12-31 DIAGNOSIS — F1994 Other psychoactive substance use, unspecified with psychoactive substance-induced mood disorder: Secondary | ICD-10-CM | POA: Diagnosis present

## 2013-12-31 DIAGNOSIS — I951 Orthostatic hypotension: Secondary | ICD-10-CM | POA: Diagnosis present

## 2013-12-31 DIAGNOSIS — F1020 Alcohol dependence, uncomplicated: Secondary | ICD-10-CM | POA: Diagnosis present

## 2013-12-31 DIAGNOSIS — F1420 Cocaine dependence, uncomplicated: Secondary | ICD-10-CM | POA: Diagnosis present

## 2013-12-31 DIAGNOSIS — F1410 Cocaine abuse, uncomplicated: Secondary | ICD-10-CM

## 2013-12-31 DIAGNOSIS — F10230 Alcohol dependence with withdrawal, uncomplicated: Secondary | ICD-10-CM

## 2013-12-31 DIAGNOSIS — R259 Unspecified abnormal involuntary movements: Secondary | ICD-10-CM | POA: Diagnosis present

## 2013-12-31 MED ORDER — ACETAMINOPHEN 325 MG PO TABS: 650.0000 mg | ORAL_TABLET | Freq: Four times a day (QID) | ORAL | Status: DC | PRN

## 2013-12-31 MED ORDER — CHLORDIAZEPOXIDE HCL 25 MG PO CAPS
25.0000 mg | ORAL_CAPSULE | Freq: Three times a day (TID) | ORAL | Status: AC
  Administered 2014-01-01 – 2014-01-02 (×2): 25 mg via ORAL
  Filled 2013-12-31 (×2): qty 1

## 2013-12-31 MED ORDER — CLONIDINE HCL 0.1 MG PO TABS
0.1000 mg | ORAL_TABLET | ORAL | Status: DC | PRN
  Administered 2013-12-31: 0.1 mg via ORAL
  Filled 2013-12-31: qty 1

## 2013-12-31 MED ORDER — CHLORDIAZEPOXIDE HCL 25 MG PO CAPS: 25.0000 mg | ORAL_CAPSULE | Freq: Four times a day (QID) | ORAL | Status: AC | PRN

## 2013-12-31 MED ORDER — VITAMIN B-1 100 MG PO TABS
100.0000 mg | ORAL_TABLET | Freq: Every day | ORAL | Status: DC
  Administered 2014-01-01 – 2014-01-06 (×5): 100 mg via ORAL
  Filled 2013-12-31 (×8): qty 1

## 2013-12-31 MED ORDER — CHLORDIAZEPOXIDE HCL 25 MG PO CAPS
25.0000 mg | ORAL_CAPSULE | ORAL | Status: AC
  Administered 2014-01-02: 25 mg via ORAL
  Filled 2013-12-31: qty 1

## 2013-12-31 MED ORDER — LOPERAMIDE HCL 2 MG PO CAPS: 2.0000 mg | ORAL_CAPSULE | ORAL | Status: AC | PRN

## 2013-12-31 MED ORDER — ONDANSETRON 4 MG PO TBDP: 4.0000 mg | ORAL_TABLET | Freq: Four times a day (QID) | ORAL | Status: AC | PRN

## 2013-12-31 MED ORDER — TRAZODONE HCL 100 MG PO TABS
100.0000 mg | ORAL_TABLET | Freq: Every day | ORAL | Status: DC
  Administered 2013-12-31 – 2014-01-02 (×4): 100 mg via ORAL
  Filled 2013-12-31 (×6): qty 1

## 2013-12-31 MED ORDER — CHLORDIAZEPOXIDE HCL 25 MG PO CAPS
25.0000 mg | ORAL_CAPSULE | Freq: Four times a day (QID) | ORAL | Status: AC
  Administered 2013-12-31 – 2014-01-01 (×6): 25 mg via ORAL
  Filled 2013-12-31 (×6): qty 1

## 2013-12-31 MED ORDER — ALUM & MAG HYDROXIDE-SIMETH 200-200-20 MG/5ML PO SUSP
30.0000 mL | ORAL | Status: DC | PRN
  Administered 2014-01-06: 30 mL via ORAL

## 2013-12-31 MED ORDER — CHLORDIAZEPOXIDE HCL 25 MG PO CAPS
25.0000 mg | ORAL_CAPSULE | Freq: Every day | ORAL | Status: AC
Start: 2014-01-04 — End: 2014-01-04
  Administered 2014-01-04: 25 mg via ORAL
  Filled 2013-12-31: qty 1

## 2013-12-31 MED ORDER — MAGNESIUM HYDROXIDE 400 MG/5ML PO SUSP: 30.0000 mL | Freq: Every day | ORAL | Status: DC | PRN

## 2013-12-31 MED ORDER — ADULT MULTIVITAMIN W/MINERALS CH
1.0000 | ORAL_TABLET | Freq: Every day | ORAL | Status: DC
  Administered 2013-12-31 – 2014-01-06 (×6): 1 via ORAL
  Filled 2013-12-31 (×9): qty 1

## 2013-12-31 MED ORDER — THIAMINE HCL 100 MG/ML IJ SOLN: 100.0000 mg | Freq: Once | INTRAMUSCULAR | Status: DC

## 2013-12-31 MED ORDER — HYDROXYZINE HCL 25 MG PO TABS: 25.0000 mg | ORAL_TABLET | Freq: Four times a day (QID) | ORAL | Status: AC | PRN

## 2013-12-31 NOTE — Tx Team (Signed)
Initial Interdisciplinary Treatment Plan   PATIENT STRESSORS: Financial difficulties Health problems Substance abuse   PROBLEM LIST: Problem List/Patient Goals Date to be addressed Date deferred Reason deferred Estimated date of resolution  ETOH detox 12/31/13                                                      DISCHARGE CRITERIA:  Medical problems require only outpatient monitoring Motivation to continue treatment in a less acute level of care Verbal commitment to aftercare and medication compliance Withdrawal symptoms are absent or subacute and managed without 24-hour nursing intervention  PRELIMINARY DISCHARGE PLAN: Attend 12-step recovery group  PATIENT/FAMIILY INVOLVEMENT: This treatment plan has been presented to and reviewed with the patient, Joanna Farmer.  The patient and family have been given the opportunity to ask questions and make suggestions.  Joanna Farmer A 12/31/2013, 3:25 AM

## 2013-12-31 NOTE — [facility] Group Notes (Signed)
[place] LCSW Group Therapy 12/31/2013  1:15 pm   Type of Therapy: Group Therapy Participation Level: Active  Participation Quality: Attentive, Sharing and Supportive  Affect: Depressed and Flat  Cognitive: Alert and Oriented  Insight: Developing/Improving and Engaged  Engagement in Therapy: Developing/Improving and Engaged  Modes of Intervention: Clarification, Confrontation, Discussion, Education, Exploration, Limit-setting, Orientation, Problem-solving, Rapport Building, Art therapist, Socialization and Support  Summary of Progress/Problems: The topic for group was balance in life. Today's group focused on defining balance in one's own words, identifying things that can knock one off balance, and exploring healthy ways to maintain balance in life. Group members were asked to provide an example of a time when they felt off balance, describe how they handled that situation,and process healthier ways to regain balance in the future. Group members were asked to share the most important tool for maintaining balance that they learned while at [facility] and how they plan to apply this method after discharge. Patient actively listened during group but stated that she did not want to share on topic today.   Tilden Fossa, MSW, [place] [facility] [phone]

## 2013-12-31 NOTE — H&P (Signed)
Psychiatric Admission Assessment Adult  Patient Identification:  Joanna Farmer Date of Evaluation:  12/31/2013 Chief Complaint:  Alcohol Use Disorder  History of Present Illness: Joanna Farmer is 58 years old, African-American female, she reports, "My niece took me to the [facility] yesterday. I was drinking a lot of alcohol and smoking up some cocaine. Then, I got tired of doing this, got frustrated, then said to myself, I have got to straighten up now. I have been drinking a lot of alcohol and smoking crack for months now. When I drink and smoke me some crack, I feel happy, the effects make me feel good. I don't think that I'm an addict. I'm not really depressed. My life is pretty good. I just moved from [place] to [place] to get help with my excessive alcohol use. I'm interested in a long term rehab hospital".  Elements:  Location:  Alcohol/cocaine dependencce. Quality:  Guilt, frustration and feeling of helplessness with my substance use.. Severity:  Severe, I drink & smoke too much alcohol/crack. Timing:  "My use worsened over the last few months.. Duration:  Chronic alcoholism. Context:  "Been drinking a lot, smoking crack, got tired of it, seek help".  Associated Signs/Synptoms:  Depression Symptoms:  Denies any signs or symptoms of depression.  (Hypo) Manic Symptoms:  Denies  Anxiety Symptoms:  Excessive Worry,  Psychotic Symptoms:  Denies  PTSD Symptoms: NA  Total Time spent with patient: 1 hour  Psychiatric Specialty Exam: Physical Exam  Constitutional: She is oriented to person, place, and time. She appears well-developed and well-nourished.  HENT:  Head: Normocephalic.  Eyes: Pupils are equal, round, and reactive to light.  Cardiovascular: Normal rate.   Respiratory: Effort normal.  GI: Soft.  Genitourinary:  Denies any issues in this areas   Musculoskeletal: Normal range of motion.  Neurological: She is alert and oriented to person, place, and time.     Review of Systems  Constitutional: Negative.   HENT: Negative.   Eyes: Negative.   Respiratory: Negative.   Cardiovascular: Negative.   Gastrointestinal: Negative.   Genitourinary: Negative.   Musculoskeletal: Negative.   Skin: Negative.   Neurological: Negative.   Endo/Heme/Allergies: Negative.   Psychiatric/Behavioral: Positive for substance abuse (Alcohol/cocaine dependence). Negative for depression, suicidal ideas, hallucinations and memory loss. The patient has insomnia. The patient is not nervous/anxious.     Blood pressure 135/74, pulse 72, temperature 98.7 F (37.1 C), temperature source Oral, resp. rate 16, height _0  (1.499 m), weight 46.834 kg (103 lb 4 oz).Body mass index is 20.84 kg/(m^2).  General Appearance: Casual  Eye Contact::  Good  Speech:  Clear and Coherent  Volume:  Normal  Mood:  Denies symptoms of depression/anxiety  Affect:  Appropriate  Thought Process:  Coherent, Goal Directed and Intact  Orientation:  Full (Time, Place, and Person)  Thought Content:  Denies any psychotic symptoms  Suicidal Thoughts:  No  Homicidal Thoughts:  No  Memory:  Immediate;   Good Recent;   Good Remote;   Good  Judgement:  Fair  Insight:  Present  Psychomotor Activity:  Normal  Concentration:  Good  Recall:  Good  Fund of Knowledge:Fair  Language: Fair  Akathisia:  No  Handed:  Right  AIMS (if indicated):     Assets:  Communication Skills Desire for Improvement  Sleep:  Number of Hours: 3.25   Musculoskeletal: Strength & Muscle Tone: within normal limits Gait & Station: normal Patient leans: N/A  Past Psychiatric History: Diagnosis: Alcohol  dependence, Cannabis dependence  Hospitalizations: [facility] adult unit  Outpatient Care: None reported  Substance Abuse Care: None reported  Self-Mutilation: Denies  Suicidal Attempts: Denies  Violent Behaviors: Denies   Past Medical History:   Past Medical History  Diagnosis Date  . Hypertension   . Depression     Cardiac History:  HTN  Allergies:  No Known Allergies  PTA Medications: Prescriptions prior to admission  Medication Sig Dispense Refill  . ibuprofen (ADVIL,MOTRIN) 200 MG tablet Take 600 mg by mouth every 6 (six) hours as needed for moderate pain.       Previous Psychotropic Medications: Medication/Dose  See medication lists               Substance Abuse History in the last 12 months:  Yes.    Consequences of Substance Abuse: Medical Consequences:  Liver damage, Possible death by overdose Legal Consequences:  Arrests, jail time, Loss of driving privilege. Family Consequences:  Family discord, divorce and or separation.  Social History:  reports that she has been smoking Cigarettes.  She has been smoking about 0.50 packs per day. She does not have any smokeless tobacco history on file. She reports that she drinks alcohol. She reports that she uses illicit drugs (Marijuana and Cocaine). Additional Social History: Current Place of Residence: [place], [place]    Place of Birth: [place], [place]  Family Members: "My daughter"  Marital Status:  Separated  Children: 2  Sons: 1  Daughters: 1  Relationships: Separated  Education:  No high school diploma  Educational Problems/Performance: Did complete high school  Religious Beliefs/Practices: NA  History of Abuse (Emotional/Phsycial/Sexual): Denies  Occupational Experiences: Medical laboratory scientific officer History:  None.  Legal History: Denies any pending legal charges  Hobbies/Interests: NA  Family History:  History reviewed. No pertinent family history.  Results for orders placed during the hospital encounter of 12/30/13 (from the past 72 hour(s))  URINE RAPID DRUG SCREEN (HOSP PERFORMED)     Status: Abnormal   Collection Time    12/30/13  1:26 PM      Result Value Ref Range   Opiates NONE DETECTED  NONE DETECTED   Cocaine POSITIVE (*) NONE DETECTED   Benzodiazepines NONE DETECTED  NONE DETECTED   Amphetamines  NONE DETECTED  NONE DETECTED   Tetrahydrocannabinol NONE DETECTED  NONE DETECTED   Barbiturates NONE DETECTED  NONE DETECTED   Comment:            DRUG SCREEN FOR MEDICAL PURPOSES     ONLY.  IF CONFIRMATION IS NEEDED     FOR ANY PURPOSE, NOTIFY LAB     WITHIN 5 DAYS.                LOWEST DETECTABLE LIMITS     FOR URINE DRUG SCREEN     Drug Class       Cutoff (ng/mL)     Amphetamine      1000     Barbiturate      200     Benzodiazepine   262     Tricyclics       035     Opiates          300     Cocaine          300     THC              50  URINALYSIS, ROUTINE W REFLEX MICROSCOPIC     Status: Abnormal   Collection Time  12/30/13  1:38 PM      Result Value Ref Range   Color, Urine YELLOW  YELLOW   APPearance CLEAR  CLEAR   Specific Gravity, Urine <1.005 (*) 1.005 - 1.030   pH 6.0  5.0 - 8.0   Glucose, UA NEGATIVE  NEGATIVE mg/dL   Hgb urine dipstick NEGATIVE  NEGATIVE   Bilirubin Urine NEGATIVE  NEGATIVE   Ketones, ur NEGATIVE  NEGATIVE mg/dL   Protein, ur NEGATIVE  NEGATIVE mg/dL   Urobilinogen, UA 0.2  0.0 - 1.0 mg/dL   Nitrite NEGATIVE  NEGATIVE   Leukocytes, UA SMALL (*) NEGATIVE  CBC WITH DIFFERENTIAL     Status: None   Collection Time    12/30/13  1:38 PM      Result Value Ref Range   WBC 4.4  4.0 - 10.5 K/uL   RBC 4.10  3.87 - 5.11 MIL/uL   Hemoglobin 12.9  12.0 - 15.0 g/dL   HCT 38.5  36.0 - 46.0 %   MCV 93.9  78.0 - 100.0 fL   MCH 31.5  26.0 - 34.0 pg   MCHC 33.5  30.0 - 36.0 g/dL   RDW 13.9  11.5 - 15.5 %   Platelets 218  150 - 400 K/uL   Neutrophils Relative % 46  43 - 77 %   Neutro Abs 2.1  1.7 - 7.7 K/uL   Lymphocytes Relative 45  12 - 46 %   Lymphs Abs 2.0  0.7 - 4.0 K/uL   Monocytes Relative 8  3 - 12 %   Monocytes Absolute 0.3  0.1 - 1.0 K/uL   Eosinophils Relative 1  0 - 5 %   Eosinophils Absolute 0.0  0.0 - 0.7 K/uL   Basophils Relative 0  0 - 1 %   Basophils Absolute 0.0  0.0 - 0.1 K/uL  BASIC METABOLIC PANEL     Status: Abnormal    Collection Time    12/30/13  1:38 PM      Result Value Ref Range   Sodium 146  137 - 147 mEq/L   Potassium 3.6 (*) 3.7 - 5.3 mEq/L   Chloride 106  96 - 112 mEq/L   CO2 24  19 - 32 mEq/L   Glucose, Bld 150 (*) 70 - 99 mg/dL   BUN 16  6 - 23 mg/dL   Creatinine, Ser 0.98  0.50 - 1.10 mg/dL   Calcium 9.3  8.4 - 10.5 mg/dL   GFR calc non Af Amer 62 (*) >90 mL/min   GFR calc Af Amer 72 (*) >90 mL/min   Comment: (NOTE)     The eGFR has been calculated using the CKD EPI equation.     This calculation has not been validated in all clinical situations.     eGFR's persistently <90 mL/min signify possible Chronic Kidney     Disease.   Anion gap 16 (*) 5 - 15  ETHANOL     Status: Abnormal   Collection Time    12/30/13  1:38 PM      Result Value Ref Range   Alcohol, Ethyl (B) 147 (*) 0 - 11 mg/dL   Comment:            LOWEST DETECTABLE LIMIT FOR     SERUM ALCOHOL IS 11 mg/dL     FOR MEDICAL PURPOSES ONLY  TROPONIN I     Status: None   Collection Time    12/30/13  1:38 PM  Result Value Ref Range   Troponin I <0.30  <0.30 ng/mL   Comment:            Due to the release kinetics of cTnI,     a negative result within the first hours     of the onset of symptoms does not rule out     myocardial infarction with certainty.     If myocardial infarction is still suspected,     repeat the test at appropriate intervals.  URINE MICROSCOPIC-ADD ON     Status: Abnormal   Collection Time    12/30/13  1:38 PM      Result Value Ref Range   Squamous Epithelial / LPF MANY (*) RARE   WBC, UA 21-50  <3 WBC/hpf   RBC / HPF 3-6  <3 RBC/hpf   Bacteria, UA MANY (*) RARE   Psychological Evaluations:  Assessment:   DSM5: Schizophrenia Disorders:  NA Obsessive-Compulsive Disorders:  NA Trauma-Stressor Disorders:  NA Substance/Addictive Disorders:  Alcohol Related Disorder - Severe (303.90) Depressive Disorders:  NA  AXIS I:  Alcohol dependence, Cocaine dependence AXIS II:  Deferred AXIS III:    Past Medical History  Diagnosis Date  . Hypertension   . Depression    AXIS IV:  other psychosocial or environmental problems and Polysubstance dependence AXIS V:  1-10 persistent dangerousness to self and others present  Treatment Plan/Recommendations: 1. Admit for crisis management and stabilization, estimated length of stay 3-5 days.  2. Medication management to reduce current symptoms to base line and improve the patient's overall level of functioning; continue with current treatment plan. 3. Treat health problems as indicated.  4. Develop treatment plan to decrease risk of relapse upon discharge and the need for readmission.  5. Psycho-social education regarding relapse prevention and self care.  6. Health care follow up as needed for medical problems.  7. Review, reconcile, and reinstate any pertinent home medications for other health issues where appropriate. 8. Call for consults with hospitalist for any additional specialty patient care services as needed.  Treatment Plan Summary: Daily contact with patient to assess and evaluate symptoms and progress in treatment Medication management  Current Medications:  Current Facility-Administered Medications  Medication Dose Route Frequency Provider Last Rate Last Dose  . acetaminophen (TYLENOL) tablet 650 mg  650 mg Oral Q6H PRN Waylan Boga, NP      . alum & mag hydroxide-simeth (MAALOX/MYLANTA) 200-200-20 MG/5ML suspension 30 mL  30 mL Oral Q4H PRN Waylan Boga, NP      . chlordiazePOXIDE (LIBRIUM) capsule 25 mg  25 mg Oral Q6H PRN Waylan Boga, NP      . chlordiazePOXIDE (LIBRIUM) capsule 25 mg  25 mg Oral QID Waylan Boga, NP   25 mg at 12/31/13 0841   Followed by  . [START ON 01/01/2014] chlordiazePOXIDE (LIBRIUM) capsule 25 mg  25 mg Oral TID Waylan Boga, NP       Followed by  . [START ON 01/02/2014] chlordiazePOXIDE (LIBRIUM) capsule 25 mg  25 mg Oral BH-qamhs Waylan Boga, NP       Followed by  . [START ON 01/04/2014]  chlordiazePOXIDE (LIBRIUM) capsule 25 mg  25 mg Oral Daily Waylan Boga, NP      . cloNIDine (CATAPRES) tablet 0.1 mg  0.1 mg Oral PRN Waylan Boga, NP   0.1 mg at 12/31/13 0212  . hydrOXYzine (ATARAX/VISTARIL) tablet 25 mg  25 mg Oral Q6H PRN Waylan Boga, NP      . loperamide (IMODIUM)  capsule 2-4 mg  2-4 mg Oral PRN Waylan Boga, NP      . magnesium hydroxide (MILK OF MAGNESIA) suspension 30 mL  30 mL Oral Daily PRN Waylan Boga, NP      . multivitamin with minerals tablet 1 tablet  1 tablet Oral Daily Waylan Boga, NP   1 tablet at 12/31/13 0837  . ondansetron (ZOFRAN-ODT) disintegrating tablet 4 mg  4 mg Oral Q6H PRN Waylan Boga, NP      . thiamine (B-1) injection 100 mg  100 mg Intramuscular Once Waylan Boga, NP      . Derrill Memo ON 01/01/2014] thiamine (VITAMIN B-1) tablet 100 mg  100 mg Oral Daily Waylan Boga, NP      . traZODone (DESYREL) tablet 100 mg  100 mg Oral QHS Waylan Boga, NP   100 mg at 12/31/13 0212    Observation Level/Precautions:  15 minute checks  Laboratory:  Per ED  Psychotherapy: Group counseling sessions, AA/NA meetings  Medications:  See medication lists,  Consultations:  As needed  Discharge Concerns:  Maintaining sobriety  Estimated LOS: 2-4 days  Other:     I certify that inpatient services furnished can reasonably be expected to improve the patient's condition.   Encarnacion Slates, [place] 9/17/201510:53 AM I personally assessed the patient, reviewed the physical exam and labs and formulated the treatment plan Geralyn Flash A. Sabra Heck, M.D.
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
## 2013-12-31 NOTE — Progress Notes (Signed)
D: Pt is a 58 yr old female vol admitted for ETOH detox. Pt presents with mild tremors this evening. Pt verbalizes no physical complaints at this time. Pt denies any SI/HI/AVH. Pt reports that she drinks at least 12 beers daily. She smokes about a "$20 rock" of cocaine on a typical day that she has the funds. Pt is denying any psychosocial symptoms. She reports being here for strictly detox. Pt was calm and cooperative on admission. Pt was orientated to the unit's policies and procedures.

## 2013-12-31 NOTE — BHH Suicide Risk Assessment (Signed)
Suicide Risk Assessment  Admission Assessment     Nursing information obtained from:  Patient Demographic factors:  Low socioeconomic status;Unemployed Current Mental Status:  NA Loss Factors:  Decline in physical health;Financial problems / change in socioeconomic status Historical Factors:  NA Risk Reduction Factors:  Sense of responsibility to family;Living with another person, especially a relative Total Time spent with patient: 45 minutes  CLINICAL FACTORS:   Alcohol/Substance Abuse/Dependencies  Psychiatric Specialty Exam:     Blood pressure 133/70, pulse 71, temperature 98.7 F (37.1 C), temperature source Oral, resp. rate 16, height 4\' 11"  (1.499 m), weight 46.834 kg (103 lb 4 oz).Body mass index is 20.84 kg/(m^2).  General Appearance: Fairly Groomed  Engineer, water::  Fair  Speech:  Clear and Coherent  Volume:  Normal  Mood:  Anxious and worried  Affect:  anxious, worried  Thought Process:  Coherent and Goal Directed  Orientation:  Full (Time, Place, and Person)  Thought Content:  symptoms events worries concerns  Suicidal Thoughts:  No  Homicidal Thoughts:  No  Memory:  Immediate;   Fair Recent;   Fair Remote;   Fair  Judgement:  Fair  Insight:  Present  Psychomotor Activity:  Restlessness  Concentration:  Fair  Recall:  [employer] of Knowledge:NA  Language: Fair  Akathisia:  No  Handed:    AIMS (if indicated):     Assets:  Desire for Improvement  Sleep:  Number of Hours: 3.25   Musculoskeletal: Strength & Muscle Tone: within normal limits Gait & Station: normal Patient leans: N/A  COGNITIVE FEATURES THAT CONTRIBUTE TO RISK:  Closed-mindedness Polarized thinking Thought constriction (tunnel vision)    SUICIDE RISK:   Mild:  Suicidal ideation of limited frequency, intensity, duration, and specificity.  There are no identifiable plans, no associated intent, mild dysphoria and related symptoms, good self-control (both objective and subjective assessment),  few other risk factors, and identifiable protective factors, including available and accessible social support.  PLAN OF CARE: Supportive approach/copign skills/relapse prevention                               Librium detox protocol                               Reassess and address the co morbidities  I certify that inpatient services furnished can reasonably be expected to improve the patient's condition.  Daevon Holdren A 12/31/2013, 4:45 PM

## 2013-12-31 NOTE — Progress Notes (Signed)
D: Pt presents anxious in affect and pleasant in mood. Pt is currently denying any withdrawal symptoms. Pt was active within the milieu. Pt is currently denying any SI/HI/AVH.  A: Writer administered scheduled medications to pt, per MD orders. Continued support and availability as needed was extended to this pt. Staff continue to monitor pt with q90min checks.  R: No adverse drug reactions noted. Pt receptive to treatment. Pt remains safe at this time.

## 2013-12-31 NOTE — Progress Notes (Signed)
Pt attended karaoke group this evening.  

## 2013-12-31 NOTE — Progress Notes (Signed)
Patient ID: Joanna Farmer, female   DOB: 20-Apr-1955, 58 y.o.   MRN: [id number]  D: Patient pleasant on approach this am. Reports she doesn't feel too bad right at this moment. Patient on librium protocol. Minimal withdrawal symptoms to report. Contracts for safety on the unit. BP elevated on admission but 134/74 a couple hours ago.  A: Staff will monitor on q 15 minute checks, follow treatment plan, and give meds as ordered. BP will be checked several times today. R: Went back to bed due to very little sleep since admission this early morning.

## 2013-12-31 NOTE — BHH Suicide Risk Assessment (Signed)
[facility] INPATIENT:  Family/Significant Other Suicide Prevention Education  Suicide Prevention Education:  Education Completed; Joanna Farmer, Daughter, [phone]; has been identified by the patient as the family member/significant other with whom the patient will be residing, and identified as the person(s) who will aid the patient in the event of a mental health crisis (suicidal ideations/suicide attempt).  With written consent from the patient, the family member/significant other has been provided the following suicide prevention education, prior to the and/or following the discharge of the patient.  The suicide prevention education provided includes the following:  Suicide risk factors  Suicide prevention and interventions  National Suicide Hotline telephone number  [facility] assessment telephone number  [facility] Emergency Assistance [place] and/or Residential Mobile Crisis Unit telephone number  Request made of family/significant other to:  Remove weapons (e.g., guns, rifles, knives), all items previously/currently identified as safety concern.   Daughter advised patient does not have access to weapons.    Remove drugs/medications (over-the-counter, prescriptions, illicit drugs), all items previously/currently identified as a safety concern.  The family member/significant other verbalizes understanding of the suicide prevention education information provided.  The family member/significant other agrees to remove the items of safety concern listed above.  Joanna Farmer 12/31/2013, 12:22 PM

## 2013-12-31 NOTE — BHH Group Notes (Signed)
[place] [facility] Group Notes:  (Nursing/MHT/Case Management/Adjunct)  Date:  12/31/2013  Time:  0930  Type of Therapy:  Nurse Education  Participation Level:  Active  Participation Quality:  Appropriate  Affect:  Appropriate  Cognitive:  Appropriate  Insight:  Appropriate  Engagement in Group:  Engaged  Modes of Intervention:  Clarification and Support  Summary of Progress/Problems: Morning wellness  Joanna Farmer 12/31/2013, 3:18 PM

## 2014-01-01 LAB — COMPREHENSIVE METABOLIC PANEL
ALT: 12 U/L (ref 0–35)
AST: 20 U/L (ref 0–37)
Albumin: 3.3 g/dL — ABNORMAL LOW (ref 3.5–5.2)
Alkaline Phosphatase: 67 U/L (ref 39–117)
Anion gap: 12 (ref 5–15)
BUN: 23 mg/dL (ref 6–23)
CO2: 29 mEq/L (ref 19–32)
CREATININE: 1.23 mg/dL — AB (ref 0.50–1.10)
Calcium: 9.8 mg/dL (ref 8.4–10.5)
Chloride: 101 mEq/L (ref 96–112)
GFR calc Af Amer: 55 mL/min — ABNORMAL LOW (ref >90)
GFR, EST NON AFRICAN AMERICAN: 47 mL/min — AB (ref >90)
Glucose, Bld: 86 mg/dL (ref 70–99)
Potassium: 4 mEq/L (ref 3.7–5.3)
SODIUM: 142 meq/L (ref 137–147)
TOTAL PROTEIN: 7.9 g/dL (ref 6.0–8.3)
Total Bilirubin: 0.2 mg/dL — ABNORMAL LOW (ref 0.3–1.2)

## 2014-01-01 MED ORDER — CLONIDINE HCL 0.1 MG PO TABS
0.1000 mg | ORAL_TABLET | Freq: Two times a day (BID) | ORAL | Status: DC
  Administered 2014-01-01 – 2014-01-06 (×10): 0.1 mg via ORAL
  Filled 2014-01-01 (×12): qty 1
  Filled 2014-01-01: qty 28
  Filled 2014-01-01 (×2): qty 1
  Filled 2014-01-01: qty 28
  Filled 2014-01-01: qty 1

## 2014-01-01 MED ORDER — CLONIDINE HCL 0.1 MG PO TABS
0.1000 mg | ORAL_TABLET | Freq: Every day | ORAL | Status: DC
  Administered 2014-01-01: 0.1 mg via ORAL
  Filled 2014-01-01 (×3): qty 1

## 2014-01-01 NOTE — Progress Notes (Signed)
D) Pt states that she is feeling a little better today. Rates her depression at a 1 and her hopelessness and anxiety both at a 0. Pt states that she wants to be happy and not to drink anymore. States, "I do will do whatever it takes to be happy". Has attended the groups and is interacting with her peers appropriately. A) Given support, reassurance and praise. Encouragement given and provided with a 1:1. R) Denies SI and HI.

## 2014-01-01 NOTE — [facility] Group Notes (Signed)
Adult Psychoeducational Group Note  Date:  01/01/2014 Time:  9:51 PM  Group Topic/Focus:  AA Meeting  Participation Level:  Did Not Attend  Participation Quality:  None  Affect:  None  Cognitive:  None  Insight: None  Engagement in Group:  None  Modes of Intervention:  Discussion and Education  Additional Comments:  K[place]adi did not attend group.  Victorino Sparrow A 01/01/2014, 9:51 PM

## 2014-01-01 NOTE — Tx Team (Signed)
Interdisciplinary Treatment Plan Update   Date Reviewed:  01/01/2014  Time Reviewed:  8:44 AM  Progress in Treatment:   Attending groups: Yes Participating in groups: Yes Taking medication as prescribed: Yes  Tolerating medication: Yes Family/Significant other contact made: Yes, collateral contact with daughter. Patient understands diagnosis: Yes, patient understand diagnosis and able to state goals for treatment  Discussing patient identified problems/goals with staff: Yes Medical problems stabilized or resolved: Yes Denies suicidal/homicidal ideation: Yes Patient has not harmed self or others: Yes  For review of initial/current patient goals, please see plan of care.  Estimated Length of Stay:  3-4 days  Reasons for Continued Hospitalization:  Anxiety Depression Medication stabilization   New Problems/Goals identified:    Discharge Plan or Barriers:   Referral made to [facility] for residential treatment  Additional Comments:  Joanna Farmer is a 58 y.o. female who voluntarily presents to [facility] for alcohol/cocaine/thc detox. Pt denies SI/HI/AVH, however she admits past SI attempt by cutting her wrists(10 yrs ago). Pt reports she is consuming 15+ 16-24oz beers, daily, Her last drink(1-16oz) was today. Pt uses $40 worth of cocaine at least 2x's a week and last use was on 12/27/13 and she occasionally uses 1 marijuana joint. She last used marijuana was 12/27/13. Pt denies any seizure/blackout activity, no legal issues.    Patient and CSW reviewed patient's identified goals and treatment plan.  Patient verbalized understanding and agreed to treatment plan.   Attendees:  Patient:  01/01/2014 8:44 AM   Signature:  Gabriel Earing, MD 01/01/2014 8:44 AM  Signature: Carlton Adam, MD 01/01/2014 8:44 AM  Signature:  Talbert Cage, RN 01/01/2014 8:44 AM  Signature: Drake Leach RN 01/01/2014 8:44 AM  Signature:   01/01/2014 8:44 AM  Signature:  Joette Catching, LCSW 01/01/2014 8:44 AM  Signature:  Erasmo Downer  Drinkard, LCSW-A 01/01/2014 8:44 AM  Signature:  Lucinda Dell, Care Coordinator [facility] 01/01/2014 8:44 AM  Signature:     01/01/2014 8:44 AM  Signature:    01/01/2014  8:44 AM  Signature:   Lars Pinks, RN [facility] 01/01/2014  8:44 AM  Signature:  Vallejo Worker LCSW 01/01/2014  8:44 AM    Scribe for Treatment Team:   Joette Catching,  01/01/2014 8:44 AM

## 2014-01-01 NOTE — Progress Notes (Signed)
D. Pt has been visible in milieu, however minimal interaction or participation in various group activities. Pt appears depressed and withdrawn in the milieu this evening. Pt reports that she feels the medications we are giving her are making her tired. BP remains elevated this evening and did receive BP meds as ordered. Pt has denied SI/HI. A. Support and encouragement provided. R. Safety maintained, will continue to monitor.

## 2014-01-01 NOTE — Progress Notes (Signed)
[facility] MD Progress Note  01/01/2014 5:43 PM Joanna Farmer  MRN:  [id number] Subjective:  Joanna Farmer continues to detox. Her BP is still fluctuating in the high end. She states she really needs to go to rehab. She is committed but states she does not know if she is going to be able to make it otherwise.  Diagnosis:   DSM5: Substance/Addictive Disorders:  Alcohol Related Disorder - Severe (303.90) Total Time spent with patient: 30 minutes  Axis I: Substance Induced Mood Disorder  ADL's:  Intact  Sleep: Fair  Appetite:  Fair  Psychiatric Specialty Exam: Physical Exam  Review of Systems  Constitutional: Positive for malaise/fatigue.  HENT: Negative.   Eyes: Negative.   Respiratory: Negative.   Cardiovascular: Negative.   Gastrointestinal: Negative.   Genitourinary: Negative.   Musculoskeletal: Negative.   Skin: Negative.   Neurological: Positive for weakness.  Endo/Heme/Allergies: Negative.   Psychiatric/Behavioral: Positive for substance abuse. The patient is nervous/anxious.     Blood pressure 161/86, pulse 84, temperature 98.1 F (36.7 C), temperature source Oral, resp. rate 16, height 4\' 11"  (1.499 m), weight 46.834 kg (103 lb 4 oz).Body mass index is 20.84 kg/(m^2).  General Appearance: Fairly Groomed  Engineer, water::  Fair  Speech:  Clear and Coherent  Volume:  Decreased  Mood:  sad anxious worried  Affect:  Tearful  Thought Process:  Coherent and Goal Directed  Orientation:  Full (Time, Place, and Person)  Thought Content:  worries and concerns about her ability to abstain without further residential treatment  Suicidal Thoughts:  No  Homicidal Thoughts:  No  Memory:  Immediate;   Fair Recent;   Fair Remote;   Fair  Judgement:  Fair  Insight:  Present  Psychomotor Activity:  Restlessness  Concentration:  Fair  Recall:  [employer] of Knowledge:NA  Language: Fair  Akathisia:  No  Handed:    AIMS (if indicated):     Assets:  Desire for Improvement  Sleep:  Number of  Hours: 6.5   Musculoskeletal: Strength & Muscle Tone: within normal limits Gait & Station: normal Patient leans: N/A  Current Medications: Current Facility-Administered Medications  Medication Dose Route Frequency Provider Last Rate Last Dose  . acetaminophen (TYLENOL) tablet 650 mg  650 mg Oral Q6H PRN Waylan Boga, NP      . alum & mag hydroxide-simeth (MAALOX/MYLANTA) 200-200-20 MG/5ML suspension 30 mL  30 mL Oral Q4H PRN Waylan Boga, NP      . chlordiazePOXIDE (LIBRIUM) capsule 25 mg  25 mg Oral Q6H PRN Waylan Boga, NP      . chlordiazePOXIDE (LIBRIUM) capsule 25 mg  25 mg Oral TID Waylan Boga, NP   25 mg at 01/01/14 1658   Followed by  . [START ON 01/02/2014] chlordiazePOXIDE (LIBRIUM) capsule 25 mg  25 mg Oral BH-qamhs Waylan Boga, NP       Followed by  . [START ON 01/04/2014] chlordiazePOXIDE (LIBRIUM) capsule 25 mg  25 mg Oral Daily Waylan Boga, NP      . cloNIDine (CATAPRES) tablet 0.1 mg  0.1 mg Oral Daily Encarnacion Slates, NP   0.1 mg at 01/01/14 1213  . hydrOXYzine (ATARAX/VISTARIL) tablet 25 mg  25 mg Oral Q6H PRN Waylan Boga, NP      . loperamide (IMODIUM) capsule 2-4 mg  2-4 mg Oral PRN Waylan Boga, NP      . magnesium hydroxide (MILK OF MAGNESIA) suspension 30 mL  30 mL Oral Daily PRN Waylan Boga, NP      .  multivitamin with minerals tablet 1 tablet  1 tablet Oral Daily Waylan Boga, NP   1 tablet at 01/01/14 0824  . ondansetron (ZOFRAN-ODT) disintegrating tablet 4 mg  4 mg Oral Q6H PRN Waylan Boga, NP      . thiamine (B-1) injection 100 mg  100 mg Intramuscular Once Waylan Boga, NP      . thiamine (VITAMIN B-1) tablet 100 mg  100 mg Oral Daily Waylan Boga, NP   100 mg at 01/01/14 0824  . traZODone (DESYREL) tablet 100 mg  100 mg Oral QHS Waylan Boga, NP   100 mg at 12/31/13 2149    Lab Results: No results found for this or any previous visit (from the past 48 hour(s)).  Physical Findings: AIMS: Facial and Oral Movements Muscles of Facial Expression: None,  normal Lips and Perioral Area: None, normal Jaw: None, normal Tongue: None, normal,Extremity Movements Upper (arms, wrists, hands, fingers): None, normal Lower (legs, knees, ankles, toes): None, normal, Trunk Movements Neck, shoulders, hips: None, normal, Overall Severity Severity of abnormal movements (highest score from questions above): None, normal Incapacitation due to abnormal movements: None, normal Patient's awareness of abnormal movements (rate only patient's report): No Awareness, Dental Status Current problems with teeth and/or dentures?: No Does patient usually wear dentures?: No  CIWA:  CIWA-Ar Total: 2 COWS:     Treatment Plan Summary: Daily contact with patient to assess and evaluate symptoms and progress in treatment Medication management  Plan: Supportive approach/coping skills/relapse prevention           Continue the Detox           Address the high BP           Get a CMET  Medical Decision Making Problem Points:  Review of psycho-social stressors (1) Data Points:  Review of medication regiment & side effects (2) Review of new medications or change in dosage (2)  I certify that inpatient services furnished can reasonably be expected to improve the patient's condition.   Joanna Farmer A 01/01/2014, 5:43 PM

## 2014-01-01 NOTE — BHH Group Notes (Signed)
[facility] LCSW Aftercare Discharge Planning Group Note   01/01/2014 8:43 AM    Participation Quality:  Appropraite  Mood/Affect:  Appropriate  Depression Rating:  0  Anxiety Rating:  0  Thoughts of Suicide:  No  Will you contract for safety?   NA  Current AVH:  No  Plan for Discharge/Comments:  Patient attended discharge planning group and actively participated in group.  She advised of relocating to [facility] recently and living with her brother.  She is requesting residential treatment and agreeable to referral being made to [facility].  CSW provided all participants with daily workbook.   Transportation Means: Patient has transportation.   Supports:  Patient has a support system.   Brenee Gajda, Eulas Post

## 2014-01-01 NOTE — BHH Group Notes (Addendum)
[place] LCSW Group Therapy  Feelings Around Relapse 1:15 -2:30        01/01/2014   Type of Therapy:  Group Therapy  Participation Level: Patient did not attend group due to not feeling well.   Concha Pyo 01/01/2014

## 2014-01-02 DIAGNOSIS — F1020 Alcohol dependence, uncomplicated: Principal | ICD-10-CM

## 2014-01-02 DIAGNOSIS — F10988 Alcohol use, unspecified with other alcohol-induced disorder: Secondary | ICD-10-CM

## 2014-01-02 NOTE — BHH Group Notes (Signed)
[facility] Group Notes:healthy Coping Skills  Date:  01/02/2014  Time:  2:48 PM  Type of Therapy:  Nurse Education  Participation Level:  Did Not Attend  Participation Quality:  Inattentive  Affect:  Flat  Cognitive:  Lacking  Insight:  None  Engagement in Group:  None  Modes of Intervention:  Discussion  Summary of Progress/Problems:pt did not attend group  Marcello Moores [facility] 01/02/2014, 2:48 PM

## 2014-01-02 NOTE — BHH Group Notes (Signed)
[place] Group Notes:  Goals group  Date:  01/02/2014  Time:  9:56 AM  Type of Therapy:  Nurse Education  Participation Level:  Did Not Attend  Participation Quality:  Inattentive  Affect:  Flat  Cognitive:  Lacking  Insight:  None  Engagement in Group:  None  Modes of Intervention:  Discussion  Summary of Progress/Problems:Pt did not attend  Marcello Moores [facility] 01/02/2014, 9:56 AM

## 2014-01-02 NOTE — Progress Notes (Signed)
D) Pt. Has been in her bed all morning and was able to get up for lunch. Affect is flat and mood depressed. Pt is unsteady on her feel and presently is in a wheelchair to prevent her from falling. Pt is pleasant on approach, but feels extremely tired and is unsteady. Pt placed in a wheelchair to be able to get around the unit. Pt rates her depression, hopelessness and anxiety all at a 0. Denies SI and HI. A) Given support, reassurance and praise. Encouragement given to rest today if she feels she needs to. Provided with a brief 1:1. Pt's noon Librium held due to Pt's unsteadiness and sleepiness R) Pt continues tired and has asked to rest for the day.

## 2014-01-02 NOTE — BHH Group Notes (Signed)
[place] Group Notes:  (Clinical Social Work)  01/02/2014     10-11AM  Summary of Progress/Problems:   The main focus of today's process group was to learn how to use a decisional balance exercise to move forward in the Stages of Change, which were described and discussed.  Motivational Interviewing and a worksheet were utilized to help patients explore in depth the perceived benefits and costs of a self-sabotaging behavior, as well as the  benefits and costs of replacing that with a healthy coping mechanism.   The patient expressed when she first arrived in group about 15 minutes after the beginning that she was only present to get ice in her cup.  CSW asked her to sit through group, as ice could not be retrieved until after group.  She did so, and had a pleasant but disengaged smile on her face throughout the remainder of group.  She was completely silent, but did seem to track the discussion with her eyes.  At the end of group, she immediately asked for ice.  Type of Therapy:  Group Therapy - Process   Participation Level:  Minimal  Participation Quality:  Attentive  Affect:  Not Congruent  Cognitive:  Alert  Insight:  Limited  Engagement in Therapy:  Limited  Modes of Intervention:  Education, Motivational Interviewing  Selmer Dominion, LCSW 01/02/2014, 1:08 PM

## 2014-01-02 NOTE — Progress Notes (Signed)
Did not attended group 

## 2014-01-02 NOTE — Progress Notes (Signed)
Writer observed patient lying in bed resting. Writer inquired as to how her day had been and she reports that her day has been okay but c/o weakness in her legs. She reports that she has attended at least 2 groups earlier today but did not feel up to going to AA this evening. Writer gave her a pitcher of gatorade and encouraged her to drink plenty of fluids other than the pepsi she has in her room. She was receptive to advise given. She reports that she plans to follow up with a treatment facility in [facility] but can't remember the name of it. Support and encouragement given, safety maintained on unit with 15 min checks, will continue to monitor.

## 2014-01-02 NOTE — Progress Notes (Signed)
[facility] MD Progress Note  01/02/2014 12:41 PM Joanna Farmer  MRN:  [id number]  Subjective:  I feel sometimes maybe dizzy.  I still have tremors and shakes.  Objective Patient seen chart reviewed.  Patient continues to detox and she still have tremors and shakes.  She feels tired and she has difficulty walking because she gets dizzy.  Her partner she is still fluctuating .  She is taking clonidine and Librium.  She's trying to go to groups however sometime she has difficulty participating .  She denies any chest pain, shortness of breath or any sweating.  She denies any hallucinations or any paranoia.  She denies any suicidal thoughts or homicidal thoughts.  She likes to go for long-term rehabilitation however sometime she has anxiety about it.  Diagnosis:   DSM5: Substance/Addictive Disorders:  Alcohol Related Disorder - Severe (303.90) Total Time spent with patient: 20 minutes  Axis I: Substance Induced Mood Disorder  ADL's:  Intact  Sleep: Fair  Appetite:  Fair  Psychiatric Specialty Exam: Physical Exam  Review of Systems  Constitutional: Positive for malaise/fatigue.  HENT: Negative.   Eyes: Negative.   Respiratory: Negative.   Cardiovascular: Negative.   Gastrointestinal: Negative.   Genitourinary: Negative.   Musculoskeletal: Negative.   Skin: Negative.   Neurological: Positive for weakness.  Endo/Heme/Allergies: Negative.   Psychiatric/Behavioral: Positive for substance abuse. The patient is nervous/anxious.     Blood pressure 157/77, pulse 97, temperature 97.7 F (36.5 C), temperature source Oral, resp. rate 16, height 4\' 11"  (1.499 m), weight 103 lb 4 oz (46.834 kg).Body mass index is 20.84 kg/(m^2).  General Appearance: Fairly Groomed, appears tired   Engineer, water::  Fair  Speech:  Clear and Coherent  Volume:  Decreased  Mood:  sad anxious worried  Affect:  Tearful  Thought Process:  Coherent and Goal Directed  Orientation:  Full (Time, Place, and Person)   Thought Content:  Rumination  Suicidal Thoughts:  No  Homicidal Thoughts:  No  Memory:  Immediate;   Fair Recent;   Fair Remote;   Fair  Judgement:  Fair  Insight:  Present  Psychomotor Activity:  Restlessness  Concentration:  Fair  Recall:  [employer] of Knowledge:NA  Language: Fair  Akathisia:  No  Handed:    AIMS (if indicated):     Assets:  Desire for Improvement  Sleep:  Number of Hours: 6.75   Musculoskeletal: Strength & Muscle Tone: within normal limits Gait & Station: normal Patient leans: N/A  Current Medications: Current Facility-Administered Medications  Medication Dose Route Frequency Provider Last Rate Last Dose  . acetaminophen (TYLENOL) tablet 650 mg  650 mg Oral Q6H PRN Waylan Boga, NP      . alum & mag hydroxide-simeth (MAALOX/MYLANTA) 200-200-20 MG/5ML suspension 30 mL  30 mL Oral Q4H PRN Waylan Boga, NP      . chlordiazePOXIDE (LIBRIUM) capsule 25 mg  25 mg Oral Q6H PRN Waylan Boga, NP      . chlordiazePOXIDE (LIBRIUM) capsule 25 mg  25 mg Oral TID Waylan Boga, NP   25 mg at 01/02/14 0753   Followed by  . chlordiazePOXIDE (LIBRIUM) capsule 25 mg  25 mg Oral BH-qamhs Waylan Boga, NP       Followed by  . [START ON 01/04/2014] chlordiazePOXIDE (LIBRIUM) capsule 25 mg  25 mg Oral Daily Waylan Boga, NP      . cloNIDine (CATAPRES) tablet 0.1 mg  0.1 mg Oral BID Nicholaus Bloom, MD   0.1  mg at 01/02/14 0753  . hydrOXYzine (ATARAX/VISTARIL) tablet 25 mg  25 mg Oral Q6H PRN Waylan Boga, NP      . loperamide (IMODIUM) capsule 2-4 mg  2-4 mg Oral PRN Waylan Boga, NP      . magnesium hydroxide (MILK OF MAGNESIA) suspension 30 mL  30 mL Oral Daily PRN Waylan Boga, NP      . multivitamin with minerals tablet 1 tablet  1 tablet Oral Daily Waylan Boga, NP   1 tablet at 01/02/14 3532  . ondansetron (ZOFRAN-ODT) disintegrating tablet 4 mg  4 mg Oral Q6H PRN Waylan Boga, NP      . thiamine (B-1) injection 100 mg  100 mg Intramuscular Once Waylan Boga, NP      .  thiamine (VITAMIN B-1) tablet 100 mg  100 mg Oral Daily Waylan Boga, NP   100 mg at 01/02/14 0753  . traZODone (DESYREL) tablet 100 mg  100 mg Oral QHS Waylan Boga, NP   100 mg at 01/01/14 2134    Lab Results:  Results for orders placed during the hospital encounter of 12/31/13 (from the past 48 hour(s))  COMPREHENSIVE METABOLIC PANEL     Status: Abnormal   Collection Time    01/01/14  7:30 PM      Result Value Ref Range   Sodium 142  137 - 147 mEq/L   Potassium 4.0  3.7 - 5.3 mEq/L   Chloride 101  96 - 112 mEq/L   CO2 29  19 - 32 mEq/L   Glucose, Bld 86  70 - 99 mg/dL   BUN 23  6 - 23 mg/dL   Creatinine, Ser 1.23 (*) 0.50 - 1.10 mg/dL   Calcium 9.8  8.4 - 10.5 mg/dL   Total Protein 7.9  6.0 - 8.3 g/dL   Albumin 3.3 (*) 3.5 - 5.2 g/dL   AST 20  0 - 37 U/L   ALT 12  0 - 35 U/L   Alkaline Phosphatase 67  39 - 117 U/L   Total Bilirubin 0.2 (*) 0.3 - 1.2 mg/dL   GFR calc non Af Amer 47 (*) >90 mL/min   GFR calc Af Amer 55 (*) >90 mL/min   Comment: (NOTE)     The eGFR has been calculated using the CKD EPI equation.     This calculation has not been validated in all clinical situations.     eGFR's persistently <90 mL/min signify possible Chronic Kidney     Disease.   Anion gap 12  5 - 15   Comment: Performed at [facility]    Physical Findings: AIMS: Facial and Oral Movements Muscles of Facial Expression: None, normal Lips and Perioral Area: None, normal Jaw: None, normal Tongue: None, normal,Extremity Movements Upper (arms, wrists, hands, fingers): None, normal Lower (legs, knees, ankles, toes): None, normal, Trunk Movements Neck, shoulders, hips: None, normal, Overall Severity Severity of abnormal movements (highest score from questions above): None, normal Incapacitation due to abnormal movements: None, normal Patient's awareness of abnormal movements (rate only patient's report): No Awareness, Dental Status Current problems with teeth and/or  dentures?: No Does patient usually wear dentures?: No  CIWA:  CIWA-Ar Total: 2 COWS:     Treatment Plan Summary: Daily contact with patient to assess and evaluate symptoms and progress in treatment Medication management  Plan:  Patient is complaining of dizziness.  Her blood pressure remains fluctuating.  We will provide wheelchair to prevent any fall and continue to watch her vitals  closely.  Continue Supportive therapy and coping skills/relapse prevention.  Continue detox treatment and to address stability of her blood pressure.  Medical Decision Making Problem Points:  Review of psycho-social stressors (1) Data Points:  Review of medication regiment & side effects (2) Review of new medications or change in dosage (2)  I certify that inpatient services furnished can reasonably be expected to improve the patient's condition.   Jennipher Weatherholtz T. 01/02/2014, 12:41 PM

## 2014-01-03 NOTE — Progress Notes (Signed)
Psychoeducational Group Note  Date:  01/03/2014 Time:  1015  Group Topic/Focus:  The focus of this group is directed at helping patients identify 3 pillars of strength  In their lives as well as what they have learned in their lives thus far.     Participation Level: Did Not Attend  Participation Quality:  Not Applicable  Affect:  Not Applicable  Cognitive:  Not Applicable  Insight:  Not Applicable  Engagement in Group: Not Applicable  Additional Comments:    Lauralyn Primes 01/03/2014, 10:55 AM

## 2014-01-03 NOTE — Progress Notes (Signed)
Writer has observed the patient up at the nursing station using the telephone. After her call writer spoke with her 1:1 and she reports that she has spent most of her day sleeping. Writer informed her that her sleep aid was discontinued due to excessive sleeping and grogginess. Patient was fine with this and feels that she wont have any trouble sleeping tonight. Writer refilled her pitcher with gatorade and encouraged her to drink plenty of fluids. She still has trouble with weakness in her legs and has been using the aid of a wheelchair. Patient sat in the dayroom briefly and watched tv but returned to her room to rest. Support and encouragement given, safety maintained with 15 min checks in place.

## 2014-01-03 NOTE — Plan of Care (Signed)
Problem: Alteration in mood & ability to function due to Goal: STG-Patient will attend groups Outcome: Not Progressing Patient did not attend AA group this evening, reported that she did not feel up to it.

## 2014-01-03 NOTE — BHH Group Notes (Signed)
[facility] Group Notes: (Clinical Social Work)   01/03/2014      Type of Therapy:  Group Therapy   Participation Level:  Did Not Attend - refused to attend group   Selmer Dominion, LCSW 01/03/2014, 1:00 PM

## 2014-01-03 NOTE — Progress Notes (Signed)
Psychoeducational Group Note  Date:  01/03/2014 Time:  1315  Group Topic/Focus:  Making Healthy Choices:   The focus of this group is to help patients identify negative/unhealthy choices they were using prior to admission and identify positive/healthier coping strategies to replace them upon discharge.  Participation Level:  Did not attend  Paulino Rily 01/03/2014

## 2014-01-03 NOTE — Progress Notes (Signed)
[facility] MD Progress Note  01/03/2014 10:34 AM Joanna Farmer  MRN:  [id number]  Subjective:  I missed groups because I'm feeling really groggy.    Objective Patient seen chart reviewed.  She remains very sedated and sleepy in the morning.  She is using the wheelchair because of dizziness.  Her Librium were hold this morning.  She feels tired.  She denies any chest pain, shortness of breath or any sweating sometimes she feel dizziness.  She denies any hallucination or any paranoia.  Overall she is feeling better from her depression.  She missed groups because of excessive sedation.  She continues to have mixed emotions about her recovery and long-term rehabilitation.  Diagnosis:   DSM5: Substance/Addictive Disorders:  Alcohol Related Disorder - Severe (303.90) Total Time spent with patient: 20 minutes  Axis I: Substance Induced Mood Disorder  ADL's:  Intact  Sleep: Fair  Appetite:  Fair  Psychiatric Specialty Exam: Physical Exam  Review of Systems  Constitutional: Positive for malaise/fatigue.  HENT: Negative.   Eyes: Negative.   Respiratory: Negative.   Cardiovascular: Negative.   Gastrointestinal: Negative.   Genitourinary: Negative.   Musculoskeletal: Negative.   Skin: Negative.   Neurological: Positive for weakness.  Endo/Heme/Allergies: Negative.   Psychiatric/Behavioral: Positive for substance abuse. The patient is nervous/anxious.     Blood pressure 159/95, pulse 88, temperature 98 F (36.7 C), temperature source Oral, resp. rate 20, height _0  (1.499 m), weight 103 lb 4 oz (46.834 kg).Body mass index is 20.84 kg/(m^2).  General Appearance: Fairly Groomed, appears tired   Engineer, water::  Fair  Speech:  Clear and Coherent  Volume:  Decreased  Mood:  sad anxious worried  Affect:  Tearful  Thought Process:  Coherent and Goal Directed  Orientation:  Full (Time, Place, and Person)  Thought Content:  Rumination  Suicidal Thoughts:  No  Homicidal Thoughts:  No   Memory:  Immediate;   Fair Recent;   Fair Remote;   Fair  Judgement:  Fair  Insight:  Present  Psychomotor Activity:  Decreased  Concentration:  Fair  Recall:  [employer] of Knowledge:NA  Language: Fair  Akathisia:  No  Handed:    AIMS (if indicated):     Assets:  Desire for Improvement  Sleep:  Number of Hours: 6.75   Musculoskeletal: Strength & Muscle Tone: within normal limits Gait & Station: normal Patient leans: N/A  Current Medications: Current Facility-Administered Medications  Medication Dose Route Frequency Provider Last Rate Last Dose  . acetaminophen (TYLENOL) tablet 650 mg  650 mg Oral Q6H PRN Waylan Boga, NP      . alum & mag hydroxide-simeth (MAALOX/MYLANTA) 200-200-20 MG/5ML suspension 30 mL  30 mL Oral Q4H PRN Waylan Boga, NP      . chlordiazePOXIDE (LIBRIUM) capsule 25 mg  25 mg Oral BH-qamhs Waylan Boga, NP   25 mg at 01/02/14 2132   Followed by  . [START ON 01/04/2014] chlordiazePOXIDE (LIBRIUM) capsule 25 mg  25 mg Oral Daily Waylan Boga, NP      . cloNIDine (CATAPRES) tablet 0.1 mg  0.1 mg Oral BID Nicholaus Bloom, MD   0.1 mg at 01/03/14 4782  . magnesium hydroxide (MILK OF MAGNESIA) suspension 30 mL  30 mL Oral Daily PRN Waylan Boga, NP      . multivitamin with minerals tablet 1 tablet  1 tablet Oral Daily Waylan Boga, NP   1 tablet at 01/02/14 9562  . thiamine (B-1) injection 100 mg  100  mg Intramuscular Once Waylan Boga, NP      . thiamine (VITAMIN B-1) tablet 100 mg  100 mg Oral Daily Waylan Boga, NP   100 mg at 01/02/14 0753  . traZODone (DESYREL) tablet 100 mg  100 mg Oral QHS Waylan Boga, NP   100 mg at 01/02/14 2131    Lab Results:  Results for orders placed during the hospital encounter of 12/31/13 (from the past 48 hour(s))  COMPREHENSIVE METABOLIC PANEL     Status: Abnormal   Collection Time    01/01/14  7:30 PM      Result Value Ref Range   Sodium 142  137 - 147 mEq/L   Potassium 4.0  3.7 - 5.3 mEq/L   Chloride 101  96 - 112 mEq/L    CO2 29  19 - 32 mEq/L   Glucose, Bld 86  70 - 99 mg/dL   BUN 23  6 - 23 mg/dL   Creatinine, Ser 1.23 (*) 0.50 - 1.10 mg/dL   Calcium 9.8  8.4 - 10.5 mg/dL   Total Protein 7.9  6.0 - 8.3 g/dL   Albumin 3.3 (*) 3.5 - 5.2 g/dL   AST 20  0 - 37 U/L   ALT 12  0 - 35 U/L   Alkaline Phosphatase 67  39 - 117 U/L   Total Bilirubin 0.2 (*) 0.3 - 1.2 mg/dL   GFR calc non Af Amer 47 (*) >90 mL/min   GFR calc Af Amer 55 (*) >90 mL/min   Comment: (NOTE)     The eGFR has been calculated using the CKD EPI equation.     This calculation has not been validated in all clinical situations.     eGFR's persistently <90 mL/min signify possible Chronic Kidney     Disease.   Anion gap 12  5 - 15   Comment: Performed at [facility]    Physical Findings: AIMS: Facial and Oral Movements Muscles of Facial Expression: None, normal Lips and Perioral Area: None, normal Jaw: None, normal Tongue: None, normal,Extremity Movements Upper (arms, wrists, hands, fingers): None, normal Lower (legs, knees, ankles, toes): None, normal, Trunk Movements Neck, shoulders, hips: None, normal, Overall Severity Severity of abnormal movements (highest score from questions above): None, normal Incapacitation due to abnormal movements: None, normal Patient's awareness of abnormal movements (rate only patient's report): No Awareness, Dental Status Current problems with teeth and/or dentures?: No Does patient usually wear dentures?: No  CIWA:  CIWA-Ar Total: 2 COWS:     Treatment Plan Summary: Daily contact with patient to assess and evaluate symptoms and progress in treatment Medication management  Plan:  Discontinue trazodone because of excessive sedation in the morning.  Her blood pressure remains fluctuating.  We will continue to monitor her blood pressure regularly since her blood pressure fluctuates.  Encouraged to participate in groups.  Continue supportive therapy and detox treatment.  Medical  Decision Making Problem Points:  Review of psycho-social stressors (1) Data Points:  Review of medication regiment & side effects (2) Review of new medications or change in dosage (2)  I certify that inpatient services furnished can reasonably be expected to improve the patient's condition.   ARFEEN,SYED T. 01/03/2014, 10:34 AM

## 2014-01-03 NOTE — Progress Notes (Signed)
D) Pt has been in her room asleep most of the day. Took 100 mg of Trazodone last night and has been unsteady and unable to wake up today. Breathing is even and unlabored. Does arouse but will start to fall asleep while talking.  Denies SI and HI. Unable to do her paperwork today but presently denies everything. States "I am just too tired and sleepy to do anything" A) Given support, and allowed to sleep off the medications.

## 2014-01-03 NOTE — Progress Notes (Signed)
Did not attended group 

## 2014-01-04 DIAGNOSIS — F10939 Alcohol use, unspecified with withdrawal, unspecified: Secondary | ICD-10-CM

## 2014-01-04 DIAGNOSIS — F1910 Other psychoactive substance abuse, uncomplicated: Secondary | ICD-10-CM

## 2014-01-04 DIAGNOSIS — F1410 Cocaine abuse, uncomplicated: Secondary | ICD-10-CM

## 2014-01-04 DIAGNOSIS — I10 Essential (primary) hypertension: Secondary | ICD-10-CM | POA: Diagnosis present

## 2014-01-04 DIAGNOSIS — F10239 Alcohol dependence with withdrawal, unspecified: Secondary | ICD-10-CM

## 2014-01-04 DIAGNOSIS — F1994 Other psychoactive substance use, unspecified with psychoactive substance-induced mood disorder: Secondary | ICD-10-CM

## 2014-01-04 DIAGNOSIS — I951 Orthostatic hypotension: Secondary | ICD-10-CM

## 2014-01-04 MED ORDER — LISINOPRIL 5 MG PO TABS
5.0000 mg | ORAL_TABLET | Freq: Every day | ORAL | Status: DC
  Administered 2014-01-04 – 2014-01-06 (×3): 5 mg via ORAL
  Filled 2014-01-04 (×4): qty 1
  Filled 2014-01-04: qty 14
  Filled 2014-01-04: qty 1

## 2014-01-04 MED ORDER — HYDRALAZINE HCL 25 MG PO TABS
25.0000 mg | ORAL_TABLET | Freq: Three times a day (TID) | ORAL | Status: DC
  Administered 2014-01-04: 25 mg via ORAL
  Filled 2014-01-04 (×4): qty 1

## 2014-01-04 NOTE — Consult Note (Signed)
[facility] History and Physical  Joanna Farmer:[id number] DOB: 11-09-1955 DOA: 12/31/2013  Referring physician:  PCP: No PCP Per Patient   Chief Complaint: HTN  HPI: MARLISSA Farmer is a 58 y.o. female with basilar history of hypertension, alcohol and polysubstance abuse. Patient admitted to the behavioral [place] for alcohol withdrawal and management of polysubstance abuse. Patient has hypertension which is been suboptimally controlled, [facility] called for hypertension. The patient mentioned that she did not see a doctor for about 5-7 years, not been on any medication since then. She abuses cocaine and alcohol. She mentions also some dizziness when she stands up.  Review of Systems:  Constitutional: negative for anorexia, fevers and sweats Eyes: negative for irritation, redness and visual disturbance Ears, nose, mouth, throat, and face: negative for earaches, epistaxis, nasal congestion and sore throat Respiratory: negative for cough, dyspnea on exertion, sputum and wheezing Cardiovascular: negative for chest pain, dyspnea, lower extremity edema, orthopnea, palpitations and syncope Gastrointestinal: negative for abdominal pain, constipation, diarrhea, melena, nausea and vomiting Genitourinary:negative for dysuria, frequency and hematuria Hematologic/lymphatic: negative for bleeding, easy bruising and lymphadenopathy Musculoskeletal:negative for arthralgias, muscle weakness and stiff joints Neurological: Dizziness especially when she stands up. Endocrine: negative for diabetic symptoms including polydipsia, polyuria and weight loss Allergic/Immunologic: negative for anaphylaxis, hay fever and urticaria  Past Medical History  Diagnosis Date  . Hypertension   . Depression    Past Surgical History  Procedure Laterality Date  . Tubal ligation     Social History:   reports that she has been smoking Cigarettes.  She has been smoking about 0.50 packs per day.  She does not have any smokeless tobacco history on file. She reports that she drinks alcohol. She reports that she uses illicit drugs (Marijuana and Cocaine).  No Known Allergies  History reviewed. No pertinent family history.   Prior to Admission medications   Medication Sig Start Date End Date Taking? Authorizing Provider  ibuprofen (ADVIL,MOTRIN) 200 MG tablet Take 600 mg by mouth every 6 (six) hours as needed for moderate pain.   Yes Historical Provider, MD   Physical Exam: Filed Vitals:   01/04/14 1123  BP: 173/82  Pulse: 77  Temp:   Resp: 16   Constitutional: Oriented to person, place, and time. Well-developed and well-nourished. Cooperative.  Head: Normocephalic and atraumatic.  Nose: Nose normal.  Mouth/Throat: Uvula is midline, oropharynx is clear and moist and mucous membranes are normal.  Eyes: Conjunctivae and EOM are normal. Pupils are equal, round, and reactive to light.  Neck: Trachea normal and normal range of motion. Neck supple.  Cardiovascular: Normal rate, regular rhythm, S1 normal, S2 normal, normal heart sounds and intact distal pulses.   Pulmonary/Chest: Effort normal and breath sounds normal.  Abdominal: Soft. Bowel sounds are normal. There is no hepatosplenomegaly. There is no tenderness.  Musculoskeletal: Normal range of motion.  Neurological: Alert and oriented to person, place, and time. Has normal strength. No cranial nerve deficit or sensory deficit.  Skin: Skin is warm, dry and intact.  Psychiatric: Has a normal mood and affect. Speech is normal and behavior is normal.   Labs on Admission:  Basic Metabolic Panel:  Recent Labs Lab 12/30/13 1338 01/01/14 1930  NA 146 142  K 3.6* 4.0  CL 106 101  CO2 24 29  GLUCOSE 150* 86  BUN 16 23  CREATININE 0.98 1.23*  CALCIUM 9.3 9.8   Liver Function Tests:  Recent Labs Lab 01/01/14 1930  AST 20  ALT 12  ALKPHOS 67  BILITOT 0.2*  PROT 7.9  ALBUMIN 3.3*   No results found for this basename:  LIPASE, AMYLASE,  in the last 168 hours No results found for this basename: AMMONIA,  in the last 168 hours CBC:  Recent Labs Lab 12/30/13 1338  WBC 4.4  NEUTROABS 2.1  HGB 12.9  HCT 38.5  MCV 93.9  PLT 218   Cardiac Enzymes:  Recent Labs Lab 12/30/13 1338  TROPONINI <0.30    BNP (last 3 results) No results found for this basename: PROBNP,  in the last 8760 hours CBG: No results found for this basename: GLUCAP,  in the last 168 hours  Radiological Exams on Admission: No results found.  EKG: Independently reviewed.   Assessment/Plan Principal Problem:   Alcohol dependence Active Problems:   Cocaine abuse   HTN (hypertension)   Orthostatic hypotension    Hypertension -Patient hasn't been on any medications recently, alcohol withdrawal probably is causing some of the high blood pressure. -Avoid beta blockers because of history of cocaine abuse. -Patient is on clonidine and hydralazine, hydralazine has to be given 3 times, has less chance of compliance. -Add lisinopril at lower dose and continue to follow and adjust dosage. -Creatinine was in the high side at the time of admission, followup on BMP in a.m.  Orthostatic hypotension -Keep patient hydrated, avoid heavy sedation with psychotropic medications, trazodone discontinued. -I explained to her majority of blood pressure medications will cause some orthostatic hypotension.  Alcohol withdrawal and polysubstance abuse -Per psych improving, management per primary.  ? UTI -Urinalysis showed pyuria and bacteriuria she consistent with UTI, patient denies any symptoms. -Denies any fever or leukocytosis. Will monitor and defer starting antibiotics.  Code Status: Full code Family Communication: Plan Discussed with the patient Disposition Plan: Per primary  Time spent: 50 minutes  [facility] Pager [phone]

## 2014-01-04 NOTE — BHH Group Notes (Signed)
[facility] LCSW Group Therapy          Overcoming Obstacles       1:15 -2:30        01/04/2014       Type of Therapy:  Group Therapy  Participation Level:  Appropriate  Participation Quality:  Appropriate  Affect:  Appropriate, Alert  Cognitive:  Attentive Appropriate  Insight: Developing/Improving Engaged  Engagement in Therapy: Developing/Imprvoing Engaged  Modes of Intervention:  Discussion Exploration  Education Rapport BuildingProblem-Solving Support  Summary of Progress/Problems:  The main focus of today's group was overcoming obstacles. Patient shared she has to stay away from alcohol.  Patient able to identify appropriate coping skills including going around family members/friends who may be drinking.   Concha Pyo 01/04/2014

## 2014-01-04 NOTE — Plan of Care (Signed)
Problem: Diagnosis: Increased Risk For Suicide Attempt Goal: STG-Patient Will Attend All Groups On The Unit Outcome: Not Progressing Patient did not attend group this evening.     

## 2014-01-04 NOTE — Progress Notes (Signed)
D:  Per pt self inventory pt reports sleeping fair, appetite good, energy level normal, ability to pay attention good, rates depression at a 1 out of 10 and hopelessness at a 1 out of 10, anxiety at a 1 out of 10, set goal for self of "together ask that I can get out", pt was looking forward to the possibility of being discharged, however is ok with staying another day and is hoping for discharge tomorrow, pt denies SI/HI/AVH, pt's BP has been elevated today--MD notified.       A:  Emotional support provided, Encouraged pt to continue with treatment plan and attend all group activities, q15 min checks maintained for safety.  R:  Pt is cooperative, going to groups, pleasant with staff and other patient, no complaints at this time.

## 2014-01-04 NOTE — Progress Notes (Signed)
[facility] MD Progress Note  01/04/2014 3:57 PM Joanna Farmer  MRN:  [id number] Subjective:  Kala's BP is still high. He kidney function is markedly decreased. She has some vague physical complains. States her legs feel "heavy" she is walking very slowly and being careful not to fall. She will still like to go to [facility] as she states she is going to need more help once she gets out of here. She would be staying with her brother and he drinks " not like me," but he keeps it in the house. States the cocaine she was getting while staying in [place]. State she does not have the means of getting cocaine around here Diagnosis:   DSM5: Substance/Addictive Disorders:  Alcohol Related Disorder - Severe (303.90), Cocaine related disorder Depressive Disorders:  Major Depressive Disorder - Moderate (296.22) Total Time spent with patient: 30 minutes  Diagnosis Axis I: Substance Induced Mood Disorder  ADL's:  Intact  Sleep: Fair  Appetite:  Fair  Psychiatric Specialty Exam: Physical Exam  Review of Systems  Constitutional: Positive for malaise/fatigue.  HENT: Negative.   Eyes: Negative.   Respiratory: Negative.   Cardiovascular: Negative.   Gastrointestinal: Negative.   Genitourinary: Negative.   Musculoskeletal: Positive for myalgias.       Legs "heavy"  Skin: Negative.   Neurological: Positive for weakness.  Endo/Heme/Allergies: Negative.   Psychiatric/Behavioral: Positive for substance abuse. The patient is nervous/anxious.     Blood pressure 173/82, pulse 77, temperature 98.5 F (36.9 C), temperature source Oral, resp. rate 16, height 4\' 11"  (1.499 m), weight 46.834 kg (103 lb 4 oz).Body mass index is 20.84 kg/(m^2).  General Appearance: Fairly Groomed  Engineer, water::  Fair  Speech:  Clear and Coherent, Slow and not sponataneous  Volume:  Decreased  Mood:  Anxious and worried  Affect:  Restricted  Thought Process:  Coherent and Goal Directed  Orientation:  Full (Time, Place, and Person)   Thought Content:  symptoms worries concerns  Suicidal Thoughts:  No  Homicidal Thoughts:  No  Memory:  Immediate;   Fair Recent;   Fair Remote;   Fair  Judgement:  Fair  Insight:  Present  Psychomotor Activity:  Decreased  Concentration:  Poor  Recall:  [employer] of Knowledge:NA  Language: Fair  Akathisia:  No  Handed:    AIMS (if indicated):     Assets:  Desire for Improvement  Sleep:  Number of Hours: 6.25   Musculoskeletal: Strength & Muscle Tone: within normal limits Gait & Station: normal Patient leans: N/A  Current Medications: Current Facility-Administered Medications  Medication Dose Route Frequency Provider Last Rate Last Dose  . acetaminophen (TYLENOL) tablet 650 mg  650 mg Oral Q6H PRN Waylan Boga, NP      . alum & mag hydroxide-simeth (MAALOX/MYLANTA) 200-200-20 MG/5ML suspension 30 mL  30 mL Oral Q4H PRN Waylan Boga, NP      . cloNIDine (CATAPRES) tablet 0.1 mg  0.1 mg Oral BID Nicholaus Bloom, MD   0.1 mg at 01/04/14 0653  . lisinopril (PRINIVIL,ZESTRIL) tablet 5 mg  5 mg Oral Daily Verlee Monte, MD   5 mg at 01/04/14 1456  . magnesium hydroxide (MILK OF MAGNESIA) suspension 30 mL  30 mL Oral Daily PRN Waylan Boga, NP      . multivitamin with minerals tablet 1 tablet  1 tablet Oral Daily Waylan Boga, NP   1 tablet at 01/04/14 0826  . thiamine (B-1) injection 100 mg  100 mg Intramuscular Once  Waylan Boga, NP      . thiamine (VITAMIN B-1) tablet 100 mg  100 mg Oral Daily Waylan Boga, NP   100 mg at 01/04/14 1194    Lab Results: No results found for this or any previous visit (from the past 48 hour(s)).  Physical Findings: AIMS: Facial and Oral Movements Muscles of Facial Expression: None, normal Lips and Perioral Area: None, normal Jaw: None, normal Tongue: None, normal,Extremity Movements Upper (arms, wrists, hands, fingers): None, normal Lower (legs, knees, ankles, toes): None, normal, Trunk Movements Neck, shoulders, hips: None, normal, Overall  Severity Severity of abnormal movements (highest score from questions above): None, normal Incapacitation due to abnormal movements: None, normal Patient's awareness of abnormal movements (rate only patient's report): No Awareness, Dental Status Current problems with teeth and/or dentures?: No Does patient usually wear dentures?: No  CIWA:  CIWA-Ar Total: 2 COWS:     Treatment Plan Summary: Daily contact with patient to assess and evaluate symptoms and progress in treatment Medication management  Plan: Supportive approach/coping skills/relapse prevention           Will ask internal medicine to assess and recommend regarding her BP and her kidney function  Medical Decision Making Problem Points:  Review of psycho-social stressors (1) Data Points:  Review of medication regiment & side effects (2) Review of new medications or change in dosage (2)  I certify that inpatient services furnished can reasonably be expected to improve the patient's condition.   Kobee Medlen A 01/04/2014, 3:57 PM

## 2014-01-04 NOTE — BHH Group Notes (Signed)
[facility] LCSW Aftercare Discharge Planning Group Note   01/04/2014 8:36 AM    Participation Quality:  Appropraite  Mood/Affect:  Appropriate  Depression Rating:  1  Anxiety Rating:  1  Thoughts of Suicide:  No  Will you contract for safety?   NA  Current AVH:  No  Plan for Discharge/Comments:  Patient attended discharge planning group and actively participated in group.  She reports being better today and hopes to be able to get into [facility] at discharge.  CSW provided all participants with daily workbook.   Transportation Means: Patient has transportation.   Supports:  Patient has a support system.   Gregor Dershem, Eulas Post

## 2014-01-04 NOTE — Progress Notes (Signed)
Pt observed sitting in the dayroom watching TV.  Writer asked pt to speak privately, and when pt came out of the room, Probation officer noticed that pt was moving slowly and unsteady.  Pt reports she is doing better and is looking forward to discharge.  Pt denies withdrawal symptoms at this time.  She believes she is being discharged tomorrow.  She wants to go for rehab.  Pt denies SI/HI/AV.  Pt makes her needs known to staff.  Support and encouragement offered.  Safety maintained with q15 minute checks.

## 2014-01-04 NOTE — Plan of Care (Signed)
Problem: Diagnosis: Increased Risk For Suicide Attempt Goal: LTG-Patient Will Show Positive Response to Medication LTG (by discharge) : Patient will show positive response to medication and will participate in the development of the discharge plan.  Outcome: Not Progressing Patient has not shown positive response to sleep medication, it was discontinued b/c of  excessive drowsiness.

## 2014-01-05 LAB — COMPREHENSIVE METABOLIC PANEL
ALBUMIN: 3.5 g/dL (ref 3.5–5.2)
ALT: 17 U/L (ref 0–35)
ANION GAP: 11 (ref 5–15)
AST: 26 U/L (ref 0–37)
Alkaline Phosphatase: 60 U/L (ref 39–117)
BILIRUBIN TOTAL: 0.5 mg/dL (ref 0.3–1.2)
BUN: 23 mg/dL (ref 6–23)
CHLORIDE: 105 meq/L (ref 96–112)
CO2: 26 mEq/L (ref 19–32)
Calcium: 9.6 mg/dL (ref 8.4–10.5)
Creatinine, Ser: 1.02 mg/dL (ref 0.50–1.10)
GFR calc Af Amer: 69 mL/min — ABNORMAL LOW (ref >90)
GFR calc non Af Amer: 59 mL/min — ABNORMAL LOW (ref >90)
GLUCOSE: 82 mg/dL (ref 70–99)
Potassium: 4 mEq/L (ref 3.7–5.3)
Sodium: 142 mEq/L (ref 137–147)
TOTAL PROTEIN: 8.6 g/dL — AB (ref 6.0–8.3)

## 2014-01-05 NOTE — Progress Notes (Signed)
D:  Per pt self inventory pt reports sleeping good, appetite good, energy level normal, ability to pay attention good, rates depression at a 0 out of 10  and hopelessness at a 1 out of 10, anxiety at a 0 out of 10, pt pleasant during interaction, brightens on approach, denies SI/HI/AVH, goal for today is to "get out of here".     A:  Emotional support provided, Encouraged pt to continue with treatment plan and attend all group activities, q15 min checks maintained for safety.  R:  Pt is receptive, calm and cooperative with staff and other patients, going to groups.

## 2014-01-05 NOTE — BHH Group Notes (Signed)
[facility] LCSW Group Therapy      Feelings About Diagnosis 1:15 - 2:30 PM         01/05/2014    Type of Therapy:  Group Therapy  Participation Level:  Active  Participation Quality:  Appropriate  Affect:  Appropriate  Cognitive:  Alert and Appropriate  Insight:  Developing/Improving and Engaged  Engagement in Therapy:  Developing/Improving and Engaged  Modes of Intervention:  Discussion, Education, Exploration, Problem-Solving, Rapport Building, Support  Summary of Progress/Problems:  Patient actively participated in group. Patient discussed past and present diagnosis and the effects it has had on  life.  Patient talked about family and society being judgmental and the stigma associated with having a mental health diagnosis.  Patient shared alcoholism runs in her family.  She stated she is hopeful to get into a treatment program.  Concha Pyo 01/05/2014

## 2014-01-05 NOTE — Clinical Social Work Note (Signed)
CSW spoke with patient's daughter, Ethlyn Daniels.  Ms. Nicki Reaper was concerned that patient may not be going into a residential treatment program at discharge. She was informed we have made a referral to [facility] but they do not have beds available at this time.  She was informed if patient discharges before they have an opening, either she or the patient would need to call [facility] daily to see if there is an opening.  CSW advised daughter we are unable to keep patient's in the hospital if the only problem is placement.

## 2014-01-05 NOTE — Progress Notes (Addendum)
Blood pressure better controlled, BMP stable and WNL this AM. Pt currently on Clonidine 0.1 mg BID PO, Lisinopril 5 mg PO QD. Please continue current regimen and monitor BP. If BP persistently higher > 140/90, consider increasing dose of Lisinopril or even dose of Clonidine. Hydralazine was stopped as it is TID type of frequency and reduces the chance of non compliance. Will sign off and please call us with other questions.   Faye Ramsay, MD  [facility] Pager [phone] Cell [phone]  If 7PM-7AM, please contact night-coverage [email or web address] Password [email or web address]

## 2014-01-05 NOTE — Progress Notes (Signed)
[facility] MD Progress Note  01/05/2014 4:46 PM Joanna Farmer  MRN:  [id number] Subjective:  [name] continues to get better. Her BP is coming down her renal function seems to be improving. She is still awaiting a bed at [facility]. Admits that once she gets out and stays with her brother she will probably not be able to go there due to among other things lack of transportation Diagnosis:   DSM5: Substance/Addictive Disorders:  Alcohol Related Disorder - Severe (303.90) Depressive Disorders:  Major Depressive Disorder - Mild (296.21) Total Time spent with patient: 30 minutes  Axis I: Substance Induced Mood Disorder  ADL's:  Intact  Sleep: Fair  Appetite:  Fair   Psychiatric Specialty Exam: Physical Exam  Review of Systems  Constitutional: Negative.   HENT: Negative.   Eyes: Negative.   Respiratory: Negative.   Cardiovascular: Negative.   Gastrointestinal: Negative.   Genitourinary: Negative.   Musculoskeletal: Negative.   Skin: Negative.   Neurological: Positive for dizziness.  Endo/Heme/Allergies: Negative.   Psychiatric/Behavioral: Positive for substance abuse.    Blood pressure 148/68, pulse 70, temperature 98.2 F (36.8 C), temperature source Oral, resp. rate 16, height 4\' 11"  (1.499 m), weight 46.834 kg (103 lb 4 oz).Body mass index is 20.84 kg/(m^2).  General Appearance: Fairly Groomed  Engineer, water::  Fair  Speech:  Clear and Coherent, Slow and not spontaneous  Volume:  Decreased  Mood:  Anxious and Depressed  Affect:  Restricted  Thought Process:  Coherent and Goal Directed  Orientation:  Full (Time, Place, and Person)  Thought Content:  symptoms worries concerns  Suicidal Thoughts:  No  Homicidal Thoughts:  No  Memory:  Immediate;   Fair Recent;   Fair Remote;   Fair  Judgement:  Fair  Insight:  Present and Shallow  Psychomotor Activity:  Decreased  Concentration:  Fair  Recall:  [employer] of Knowledge:NA  Language: Fair  Akathisia:  No  Handed:    AIMS (if  indicated):     Assets:  Desire for Improvement Housing Social Support  Sleep:  Number of Hours: 6.25   Musculoskeletal: Strength & Muscle Tone: within normal limits Gait & Station: normal Patient leans: N/A  Current Medications: Current Facility-Administered Medications  Medication Dose Route Frequency Provider Last Rate Last Dose  . acetaminophen (TYLENOL) tablet 650 mg  650 mg Oral Q6H PRN Waylan Boga, NP      . alum & mag hydroxide-simeth (MAALOX/MYLANTA) 200-200-20 MG/5ML suspension 30 mL  30 mL Oral Q4H PRN Waylan Boga, NP      . cloNIDine (CATAPRES) tablet 0.1 mg  0.1 mg Oral BID Nicholaus Bloom, MD   0.1 mg at 01/05/14 0827  . lisinopril (PRINIVIL,ZESTRIL) tablet 5 mg  5 mg Oral Daily Verlee Monte, MD   5 mg at 01/05/14 0826  . magnesium hydroxide (MILK OF MAGNESIA) suspension 30 mL  30 mL Oral Daily PRN Waylan Boga, NP      . multivitamin with minerals tablet 1 tablet  1 tablet Oral Daily Waylan Boga, NP   1 tablet at 01/05/14 0826  . thiamine (B-1) injection 100 mg  100 mg Intramuscular Once Waylan Boga, NP      . thiamine (VITAMIN B-1) tablet 100 mg  100 mg Oral Daily Waylan Boga, NP   100 mg at 01/05/14 9476    Lab Results:  Results for orders placed during the hospital encounter of 12/31/13 (from the past 48 hour(s))  COMPREHENSIVE METABOLIC PANEL     Status:  Abnormal   Collection Time    01/05/14  6:29 AM      Result Value Ref Range   Sodium 142  137 - 147 mEq/L   Potassium 4.0  3.7 - 5.3 mEq/L   Chloride 105  96 - 112 mEq/L   CO2 26  19 - 32 mEq/L   Glucose, Bld 82  70 - 99 mg/dL   BUN 23  6 - 23 mg/dL   Creatinine, Ser 1.02  0.50 - 1.10 mg/dL   Calcium 9.6  8.4 - 10.5 mg/dL   Total Protein 8.6 (*) 6.0 - 8.3 g/dL   Albumin 3.5  3.5 - 5.2 g/dL   AST 26  0 - 37 U/L   ALT 17  0 - 35 U/L   Alkaline Phosphatase 60  39 - 117 U/L   Total Bilirubin 0.5  0.3 - 1.2 mg/dL   GFR calc non Af Amer 59 (*) >90 mL/min   GFR calc Af Amer 69 (*) >90 mL/min   Comment:  (NOTE)     The eGFR has been calculated using the CKD EPI equation.     This calculation has not been validated in all clinical situations.     eGFR's persistently <90 mL/min signify possible Chronic Kidney     Disease.   Anion gap 11  5 - 15   Comment: Performed at [facility]    Physical Findings: AIMS: Facial and Oral Movements Muscles of Facial Expression: None, normal Lips and Perioral Area: None, normal Jaw: None, normal Tongue: None, normal,Extremity Movements Upper (arms, wrists, hands, fingers): None, normal Lower (legs, knees, ankles, toes): None, normal, Trunk Movements Neck, shoulders, hips: None, normal, Overall Severity Severity of abnormal movements (highest score from questions above): None, normal Incapacitation due to abnormal movements: None, normal Patient's awareness of abnormal movements (rate only patient's report): No Awareness, Dental Status Current problems with teeth and/or dentures?: No Does patient usually wear dentures?: No  CIWA:  CIWA-Ar Total: 0 COWS:     Treatment Plan Summary: Daily contact with patient to assess and evaluate symptoms and progress in treatment Medication management  Plan: Supportive approach/coping skills/relapse prevention           Pursue actual medication regime to manage her hypertension           Facilitate admission to [facility]  Medical Decision Making Problem Points:  Review of psycho-social stressors (1) Data Points:  Review of medication regiment & side effects (2) Review of new medications or change in dosage (2)  I certify that inpatient services furnished can reasonably be expected to improve the patient's condition.   Opie Maclaughlin A 01/05/2014, 4:46 PM

## 2014-01-05 NOTE — Tx Team (Signed)
Interdisciplinary Treatment Plan Update   Date Reviewed:  01/05/2014  Time Reviewed:  8:31 AM  Progress in Treatment:   Attending groups: Yes Participating in groups: Yes Taking medication as prescribed: Yes  Tolerating medication: Yes Family/Significant other contact made: Yes, collateral contact with daughter. Patient understands diagnosis: Yes, patient understand diagnosis and hopeful to be admitted to [facility] Discussing patient identified problems/goals with staff: Yes Medical problems stabilized or resolved: Yes Denies suicidal/homicidal ideation: Yes Patient has not harmed self or others: Yes  For review of initial/current patient goals, please see plan of care.  Estimated Length of Stay: 1 day  Reasons for Continued Hospitalization:  Anxiety Depression Medication stabilization   New Problems/Goals identified:    Discharge Plan or Barriers:   Referral made to [facility] for residential treatment  Additional Comments:  Joanna Farmer is a 58 y.o. female who voluntarily presents to [facility] for alcohol/cocaine/thc detox. Pt denies SI/HI/AVH, however she admits past SI attempt by cutting her wrists(10 yrs ago). Pt reports she is consuming 15+ 16-24oz beers, daily, Her last drink(1-16oz) was today. Pt uses $40 worth of cocaine at least 2x's a week and last use was on 12/27/13 and she occasionally uses 1 marijuana joint. She last used marijuana was 12/27/13. Pt denies any seizure/blackout activity, no legal issues.    Patient and CSW reviewed patient's identified goals and treatment plan.  Patient verbalized understanding and agreed to treatment plan.   Attendees:  Patient:  01/05/2014 8:31 AM   Signature:  Gabriel Earing, MD 01/05/2014 8:31 AM  Signature: Carlton Adam, MD 01/05/2014 8:31 AM  Signature:  Satira Sark, RN 01/05/2014 8:31 AM  Signature: Janann August, RN 01/05/2014 8:31 AM  Signature:  Nickolas Madrid, RN 01/05/2014 8:31 AM  Signature:  Joette Catching, LCSW 01/05/2014 8:31 AM   Signature:  Erasmo Downer Drinkard, LCSW-A 01/05/2014 8:31 AM  Signature:  Lucinda Dell, Care Coordinator [facility] 01/05/2014 8:31 AM  Signature:     01/05/2014 8:31 AM  Signature:    01/05/2014  8:31 AM  Signature:   Lars Pinks, RN [facility] 01/05/2014  8:31 AM  Signature:  01/05/2014  8:31 AM    Scribe for Treatment Team:   Joette Catching,  01/05/2014 8:31 AM

## 2014-01-05 NOTE — Progress Notes (Signed)
Recreation Therapy Notes   Animal-Assisted Activity/Therapy (AAA/T) Program Checklist/Progress Notes Patient Eligibility Criteria Checklist & Daily Group note for Rec Tx Intervention  Date: 09.22.2015 Time: 2:45pm Location: [address]    AAA/T Program Assumption of Risk Form signed by Patient/ or Parent Legal Guardian yes  Patient is free of allergies or sever asthma yes  Patient reports no fear of animals yes  Patient reports no history of cruelty to animals yes   Patient understands his/her participation is voluntary yes  Behavioral Response: Did not attend.   Laureen Ochs Saveah Bahar, LRT/CTRS  Sihaam Chrobak L 01/05/2014 3:28 PM

## 2014-01-06 MED ORDER — ALUM & MAG HYDROXIDE-SIMETH 200-200-20 MG/5ML PO SUSP: 30.0000 mL | ORAL | Status: DC | PRN

## 2014-01-06 MED ORDER — MAGNESIUM HYDROXIDE 400 MG/5ML PO SUSP: 30.0000 mL | Freq: Every day | ORAL | Status: DC | PRN

## 2014-01-06 MED ORDER — ACETAMINOPHEN 325 MG PO TABS: 650.0000 mg | ORAL_TABLET | Freq: Four times a day (QID) | ORAL | Status: DC | PRN

## 2014-01-06 MED ORDER — CLONIDINE HCL 0.1 MG PO TABS: 0.1000 mg | ORAL_TABLET | Freq: Two times a day (BID) | ORAL | Status: DC

## 2014-01-06 MED ORDER — LISINOPRIL 5 MG PO TABS: 5.0000 mg | ORAL_TABLET | Freq: Every day | ORAL | Status: DC

## 2014-01-06 NOTE — Progress Notes (Signed)
Pt was discharged home today. She denied any S/I H/I or A/V hallucinations.  She was given f/u appointment, rx, sample medications, and hotline info booklet. She voiced understanding to all instructions provided.  She declined the need for smoking cessation materials. She did not have a nicotine patch.

## 2014-01-06 NOTE — Clinical Social Work Note (Signed)
CSW received a message from [place] at [facility] advising they do not have a female bed today.  Patient advised.  Patient stated she can return to her brother's home at discharge.

## 2014-01-06 NOTE — Progress Notes (Signed)
Pt complained of some chest discomfort this morning  She said it was in her upper chest and appeared to be more related to reflux versus cardiac   She received some maalox  Discussed heartburn and acid reflux with patient also discussed her high blood pressure and use of cocaine and how it effects the heart   Encouraged pt to talk with her doctor about the discomfort   pt also received her clonidine early due to her high blood pressure and her request for same  Pt seemed limited in her understanding but was expressing gratitude for all that was done for her

## 2014-01-06 NOTE — Progress Notes (Signed)
[facility] Adult Case Management Discharge Plan :  Will you be returning to the same living situation after discharge: Yes,  Patient is returning to brother's home. At discharge, do you have transportation home?:Yes,  Patient to arrange transportation home. Do you have the ability to pay for your medications:No.  Patient needs assistance with indigent medications   Release of information consent forms completed and in the chart;  Patient's signature needed at discharge.  Patient to Follow up at: Follow-up Information   Follow up with [facility]. (Please call [facility] [phone]) daily by 8AM to see if they have abed available.  If you do not call, your name will be removed from the wait list)    Contact information:   [address] [place], [place]   [address]  [phone]      Follow up with [facility] . (You are scheduled with [facility] on)    Contact information:   [address], [place]   [address]  [phone]      Patient denies SI/HI:   Patient no longer endorsing SI/HI or other thoughts of self harm.    Safety Planning and Suicide Prevention discussed: .Reviewed with all patients during discharge planning group   Jadea Shiffer, Eulas Post 01/06/2014, 12:33 PM

## 2014-01-06 NOTE — [facility] Suicide Risk Assessment (Addendum)
Suicide Risk Assessment  Discharge Assessment     Demographic Factors:  Unemployed  Total Time spent with patient: 30 minutes  Psychiatric Specialty Exam:     Blood pressure 153/83, pulse 75, temperature 98.2 F (36.8 C), temperature source Oral, resp. rate 16, height 4\' 11"  (1.499 m), weight 46.834 kg (103 lb 4 oz).Body mass index is 20.84 kg/(m^2).  General Appearance: Fairly Groomed  Engineer, water::  Fair  Speech:  Clear and Coherent  Volume:  Normal  Mood:  Euthymic  Affect:  Appropriate  Thought Process:  Coherent and Goal Directed  Orientation:  Full (Time, Place, and Person)  Thought Content:  plans as she moves on, relapse prevention plan  Suicidal Thoughts:  No  Homicidal Thoughts:  No  Memory:  Immediate;   Fair Recent;   Fair Remote;   Fair  Judgement:  Fair  Insight:  Present  Psychomotor Activity:  Normal  Concentration:  Fair  Recall:  [employer] of Knowledge:NA  Language: Fair  Akathisia:  No  Handed:    AIMS (if indicated):     Assets:  Desire for Improvement Housing Social Support  Sleep:  Number of Hours: 6.75    Musculoskeletal: Strength & Muscle Tone: within normal limits Gait & Station: normal Patient leans: N/A   Mental Status Per Nursing Assessment::   On Admission:  NA  Current Mental Status by Physician: In full contact with reality. There are no active S/S of withdrawal. There are no active SI plans or intent. She is going to go to [place] with her daughter while waiting for a residential treatment program bed to be available in that area   Loss Factors: Decline in physical health  Historical Factors: NA  Risk Reduction Factors:   Living with another person, especially a relative and Positive social support  Continued Clinical Symptoms:  Alcohol/Substance Abuse/Dependencies  Cognitive Features That Contribute To Risk:  Polarized thinking Thought constriction (tunnel vision)    Suicide Risk:  Minimal: No identifiable  suicidal ideation.  Patients presenting with no risk factors but with morbid ruminations; may be classified as minimal risk based on the severity of the depressive symptoms  Discharge Diagnoses:   AXIS I:  Alcohol Dependence, S/P alcohol withdrawal, Cocaine Abuse AXIS II:  No diagnosis AXIS III:   Past Medical History  Diagnosis Date  . Hypertension   . Depression    AXIS IV:  other psychosocial or environmental problems AXIS V:  61-70 mild symptoms  Plan Of Care/Follow-up recommendations:  Activity:  as tolerated Diet:  regular Follow up residential treatment center in [place] Is patient on multiple antipsychotic therapies at discharge:  No   Has Patient had three or more failed trials of antipsychotic monotherapy by history:  No  Recommended Plan for Multiple Antipsychotic Therapies: NA    Marianny Goris A 01/06/2014, 2:12 PM

## 2014-01-06 NOTE — Discharge Summary (Signed)
Physician Discharge Summary Note  Patient:  Joanna Farmer is an 58 y.o., female MRN:  [id number] DOB:  Jun 28, 1955 Patient phone:  [phone] (home)  Patient address:   [place] [address],  Total Time spent with patient: Greater than 30 minutes  Date of Admission:  12/31/2013  Date of Discharge: 089/23/15  Reason for Admission:  Alcohol detox  Discharge Diagnoses: Principal Problem:   Alcohol dependence Active Problems:   Cocaine abuse   HTN (hypertension)   Orthostatic hypotension   Psychiatric Specialty Exam: Physical Exam  Psychiatric: Her speech is normal and behavior is normal. Thought content normal. Her mood appears not anxious. Her affect is not angry, not blunt and not labile. Cognition and memory are normal. She does not exhibit a depressed mood.    Review of Systems  Constitutional: Negative.   HENT: Negative.   Eyes: Negative.   Respiratory: Negative.   Cardiovascular: Negative.   Gastrointestinal: Negative.   Genitourinary: Negative.   Musculoskeletal: Negative.   Skin: Negative.   Neurological: Negative.   Endo/Heme/Allergies: Negative.   Psychiatric/Behavioral: Positive for substance abuse (Alcoholism, chronic). Negative for depression, suicidal ideas, hallucinations and memory loss. The patient is not nervous/anxious and does not have insomnia.     Blood pressure 153/83, pulse 75, temperature 98.2 F (36.8 C), temperature source Oral, resp. rate 16, height $RemoveBe'4\' 11"'WAThexMQb$  (1.499 m), weight 46.834 kg (103 lb 4 oz).Body mass index is 20.84 kg/(m^2).   General Appearance: Fairly Groomed   Engineer, water:: Fair   Speech: Clear and Coherent   Volume: Normal   Mood: Euthymic   Affect: Appropriate   Thought Process: Coherent and Goal Directed   Orientation: Full (Time, Place, and Person)   Thought Content: plans as she moves on, relapse prevention plan   Suicidal Thoughts: No   Homicidal Thoughts: No   Memory: Immediate; Fair  Recent;  Fair  Remote; Fair   Judgement: Fair   Insight: Present   Psychomotor Activity: Normal   Concentration: Fair   Recall: [employer] of Knowledge:NA   Language: Fair   Akathisia: No   Handed:   AIMS (if indicated):   Assets: Desire for Improvement  Housing  Social Support   Sleep: Number of Hours: 6.75    Past Psychiatric History: Diagnosis: Alcohol dependence  Hospitalizations: [facility] adult unit  Outpatient Care: [facility] in [place], [place].  Substance Abuse Care: [facility] referral  Self-Mutilation: NA  Suicidal Attempts: NA  Violent Behaviors: NA   Musculoskeletal: Strength & Muscle Tone: within normal limits Gait & Station: normal Patient leans: N/A  DSM5: Schizophrenia Disorders:  NA Obsessive-Compulsive Disorders:  NA Trauma-Stressor Disorders:  NA Substance/Addictive Disorders:  Alcohol Related Disorder - Severe (303.90) Depressive Disorders:  NA  Axis Diagnosis:  AXIS I:  Alcohol dependence AXIS II:  Deferred AXIS III:   Past Medical History  Diagnosis Date  . Hypertension   . Depression    AXIS IV:  other psychosocial or environmental problems and Alcoholism, chronic AXIS V:  63  Level of Care:  [facility]  Hospital Course:  Joanna Farmer is 58 years old, African-American female, she reports, "My niece took me to the [facility] yesterday. I was drinking a lot of alcohol and smoking up some cocaine. Then, I got tired of doing this, got frustrated, then said to myself, I have got to straighten up now. I have been drinking a lot of alcohol and smoking crack for months now. When I drink and smoke  me some crack, I feel happy, the effects make me feel good. I don't think that I'm an addict. I'm not really depressed. My life is pretty good.  Joanna Farmer was admitted to the hospital with her toxicology tests results showing blood alcohol level of 147 and UDS test reports positive for Cocaine. She reported and admitted having been drinking and using a lot of alcohol & cocaine. Joanna Farmer  was in need of alcohol detoxification treatments. However, cocaine as yet does not have any established detoxification treatment protocols. She received medication management to combat any anxiety related withdrawal symptoms of cocaine.  Joanna Farmer's discharge plans included a referral to a long term residential treatment center for further substance abuse treatments after her discharge from this hospital. She enrolled and participated in the group counseling sessions and AA/NA meetings being offered and held on this unit. She learned coping skills.  Joanna Farmer's detoxification treatment was achieved using Librium detox protocols. She did deny feeling and or being depressed, anxious and or having any problems sleeping on admission. She maintained this statement through her discharge date. Joanna Farmer also presented with an uncontrolled high blood pressure issues. She received a primary care consult and was started and discharged on Clonidine 0.1 mg twice daily and Lisinopril 5 mg daily for high blood pressure. She tolerated her treatment regimen without any significant adverse effects and or reactions.  Joanna Farmer has completed detoxification treatments and her mood remains stable. She is currently being discharged with a referral to continue substance abuse treatment at the [facility] treatment center in [place], [place]. And for routine psychiatric care, she will be receiving this service at the [facility] in [place], [place]. Joanna Farmer is provided with all the pertinent information required to make these appointments without problems. Upon discharge, she adamantly denies any SIHI, AVH, delusional thoughts, paranoia and or withdrawal symptoms. She received a 14 days worth, supply samples of her [facility] discharge medications. [facility] with all personal belongings in no apparent distress. Transportation per brother.  Consults:  psychiatry and Primary care  Significant Diagnostic Studies:  labs: CBC with diff, CMP, UDS, toxicology tests, U/A,  reports reviewed, stable  Discharge Vitals:   Blood pressure 153/83, pulse 75, temperature 98.2 F (36.8 C), temperature source Oral, resp. rate 16, height $RemoveBe'4\' 11"'CkhjuUYoG$  (1.499 m), weight 46.834 kg (103 lb 4 oz). Body mass index is 20.84 kg/(m^2). Lab Results:   Results for orders placed during the hospital encounter of 12/31/13 (from the past 72 hour(s))  COMPREHENSIVE METABOLIC PANEL     Status: Abnormal   Collection Time    01/05/14  6:29 AM      Result Value Ref Range   Sodium 142  137 - 147 mEq/L   Potassium 4.0  3.7 - 5.3 mEq/L   Chloride 105  96 - 112 mEq/L   CO2 26  19 - 32 mEq/L   Glucose, Bld 82  70 - 99 mg/dL   BUN 23  6 - 23 mg/dL   Creatinine, Ser 1.02  0.50 - 1.10 mg/dL   Calcium 9.6  8.4 - 10.5 mg/dL   Total Protein 8.6 (*) 6.0 - 8.3 g/dL   Albumin 3.5  3.5 - 5.2 g/dL   AST 26  0 - 37 U/L   ALT 17  0 - 35 U/L   Alkaline Phosphatase 60  39 - 117 U/L   Total Bilirubin 0.5  0.3 - 1.2 mg/dL   GFR calc non Af Amer 59 (*) >90 mL/min   GFR calc  Af Amer 69 (*) >90 mL/min   Comment: (NOTE)     The eGFR has been calculated using the CKD EPI equation.     This calculation has not been validated in all clinical situations.     eGFR's persistently <90 mL/min signify possible Chronic Kidney     Disease.   Anion gap 11  5 - 15   Comment: Performed at [facility]    Physical Findings: AIMS: Facial and Oral Movements Muscles of Facial Expression: None, normal Lips and Perioral Area: None, normal Jaw: None, normal Tongue: None, normal,Extremity Movements Upper (arms, wrists, hands, fingers): None, normal Lower (legs, knees, ankles, toes): None, normal, Trunk Movements Neck, shoulders, hips: None, normal, Overall Severity Severity of abnormal movements (highest score from questions above): None, normal Incapacitation due to abnormal movements: None, normal Patient's awareness of abnormal movements (rate only patient's report): No Awareness, Dental  Status Current problems with teeth and/or dentures?: No Does patient usually wear dentures?: No  CIWA:  CIWA-Ar Total: 1 COWS:     Psychiatric Specialty Exam: See Psychiatric Specialty Exam and Suicide Risk Assessment completed by Attending Physician prior to discharge.  Discharge destination:  [facility]  Is patient on multiple antipsychotic therapies at discharge:  No   Has Patient had three or more failed trials of antipsychotic monotherapy by history:  No  Recommended Plan for Multiple Antipsychotic Therapies: NA    Medication List    STOP taking these medications       ibuprofen 200 MG tablet  Commonly known as:  ADVIL,MOTRIN      TAKE these medications     Indication   cloNIDine 0.1 MG tablet  Commonly known as:  CATAPRES  Take 1 tablet (0.1 mg total) by mouth 2 (two) times daily. For high blood pressure   Indication:  High Blood Pressure     lisinopril 5 MG tablet  Commonly known as:  PRINIVIL,ZESTRIL  Take 1 tablet (5 mg total) by mouth daily. For high blood pressure   Indication:  High Blood Pressure       Follow-up Information   Follow up with [facility]. (Please call [facility] [phone]) daily by 8AM to see if they have abed available.  If you do not call, your name will be removed from the wait list)    Contact information:   [address] [place], [place]   [address]  [phone]      Follow up with [facility] . (You are scheduled with [facility] on)    Contact information:   [place], [place]   [address]  [phone]     Follow-up recommendations:  Activity:  As tolerated Diet: As recommended by your primary care doctor. Keep all scheduled follow-up appointments as recommended.  Comments: Take all your medications as prescribed by your mental healthcare provider. Report any adverse effects and or reactions from your medicines to your outpatient provider promptly. Patient is instructed and cautioned to not engage in alcohol and or illegal drug use  while on prescription medicines. In the event of worsening symptoms, patient is instructed to call the crisis hotline, 911 and or go to the nearest ED for appropriate evaluation and treatment of symptoms. Follow-up with your primary care provider for your other medical issues, concerns and or health care needs.   Total Discharge Time:  Greater than 30 minutes.  Signed: Encarnacion Slates, PMHNP-BC 01/07/2014, 10:48 AM I personally assessed the patient and formulated the plan Geralyn Flash A. Sabra Heck, M.D.

## 2014-01-06 NOTE — BHH Group Notes (Signed)
[facility] LCSW Group Therapy  Emotional Regulation 1:15 - 2: 30 PM        01/06/2014     Type of Therapy:  Group Therapy  Participation Level:  Appropriate  Participation Quality:  Appropriate  Affect:  Appropriate  Cognitive:  Attentive Appropriate  Insight:  Developing/Improving Engaged  Engagement in Therapy:  Developing/Improving Engaged  Modes of Intervention:  Discussion Exploration Problem-Solving Supportive  Summary of Progress/Problems:  Group topic was emotional regulations.  Patient participated in the discussion and was able to identify an emotion that needed to regulated.  Patient talked about the need to control fear.  Patient stated she will be discharging to her brother's home and then going to live with family out of state. Patient was able to identify approprite coping skills.  Concha Pyo 01/06/2014

## 2014-01-06 NOTE — BHH Group Notes (Signed)
[facility] LCSW Aftercare Discharge Planning Group Note   01/06/2014 9:48 AM    Participation Quality:  Patient did not attend group - in bed.    Josselin Gaulin, Eulas Post

## 2014-01-06 NOTE — Progress Notes (Signed)
D   Pt has been in bed asleep since eveningshift started    She did not attend group and did not request any additional medications  A   Verbal support given   Q 15 min checks R   Pt safe at present

## 2014-01-06 NOTE — Progress Notes (Signed)
Adult Psychoeducational Group Note  Date:  01/06/2014 Time:  10:00 am  Group Topic/Focus:  Wellness Toolbox:   The focus of this group is to discuss various aspects of wellness, balancing those aspects and exploring ways to increase the ability to experience wellness.  Patients will create a wellness toolbox for use upon discharge.  Participation Level:  Did Not Attend   Joanna Farmer 01/06/2014, 12:10 PM

## 2014-01-06 NOTE — Clinical Social Work Note (Signed)
CSW spoke with patient's daughter to advise patient is being discharged from the hospital today.  Daughter advised of plans to purchase a bus ticket for patient to come to her home in [place].  Daughter advised of plans to have patient on a bus to her home by tomorrow.

## 2014-01-08 NOTE — Progress Notes (Signed)
Patient Discharge Instructions:  After Visit Summary (AVS):   Faxed to:  01/08/14 Discharge Summary Note:   Faxed to:  01/08/14 Psychiatric Admission Assessment Note:   Faxed to:  01/08/14 Suicide Risk Assessment - Discharge Assessment:   Faxed to:  01/08/14 Faxed/Sent to the Next Level Care provider:  01/08/14 Faxed to [facility] @ [phone] Faxed to [facility] @ [phone]  Patsey Berthold, 01/08/2014, 4:05 PM

## 2014-04-16 DIAGNOSIS — B009 Herpesviral infection, unspecified: Secondary | ICD-10-CM

## 2014-04-16 HISTORY — DX: Herpesviral infection, unspecified: B00.9

## 2014-11-25 ENCOUNTER — Encounter (HOSPITAL_COMMUNITY): Payer: Self-pay | Admitting: Emergency Medicine

## 2014-11-25 ENCOUNTER — Emergency Department (HOSPITAL_COMMUNITY)
Admission: EM | Admit: 2014-11-25 | Discharge: 2014-11-25 | Discharge status: Against Medical Advice | Payer: Medicaid Other | Attending: Emergency Medicine | Admitting: Emergency Medicine

## 2014-11-25 ENCOUNTER — Emergency Department (HOSPITAL_COMMUNITY): Payer: Medicaid Other

## 2014-11-25 DIAGNOSIS — I2510 Atherosclerotic heart disease of native coronary artery without angina pectoris: Secondary | ICD-10-CM | POA: Diagnosis not present

## 2014-11-25 DIAGNOSIS — R05 Cough: Secondary | ICD-10-CM | POA: Diagnosis not present

## 2014-11-25 DIAGNOSIS — Z8659 Personal history of other mental and behavioral disorders: Secondary | ICD-10-CM | POA: Insufficient documentation

## 2014-11-25 DIAGNOSIS — R2243 Localized swelling, mass and lump, lower limb, bilateral: Secondary | ICD-10-CM | POA: Insufficient documentation

## 2014-11-25 DIAGNOSIS — R42 Dizziness and giddiness: Secondary | ICD-10-CM | POA: Insufficient documentation

## 2014-11-25 DIAGNOSIS — R112 Nausea with vomiting, unspecified: Secondary | ICD-10-CM | POA: Insufficient documentation

## 2014-11-25 DIAGNOSIS — Z720 Tobacco use: Secondary | ICD-10-CM | POA: Insufficient documentation

## 2014-11-25 DIAGNOSIS — E780 Pure hypercholesterolemia: Secondary | ICD-10-CM | POA: Insufficient documentation

## 2014-11-25 DIAGNOSIS — R079 Chest pain, unspecified: Secondary | ICD-10-CM | POA: Diagnosis not present

## 2014-11-25 DIAGNOSIS — I10 Essential (primary) hypertension: Secondary | ICD-10-CM | POA: Diagnosis not present

## 2014-11-25 DIAGNOSIS — Z79899 Other long term (current) drug therapy: Secondary | ICD-10-CM | POA: Diagnosis not present

## 2014-11-25 DIAGNOSIS — Z7902 Long term (current) use of antithrombotics/antiplatelets: Secondary | ICD-10-CM | POA: Diagnosis not present

## 2014-11-25 DIAGNOSIS — R61 Generalized hyperhidrosis: Secondary | ICD-10-CM | POA: Insufficient documentation

## 2014-11-25 DIAGNOSIS — R0602 Shortness of breath: Secondary | ICD-10-CM | POA: Insufficient documentation

## 2014-11-25 LAB — COMPREHENSIVE METABOLIC PANEL
ALK PHOS: 69 U/L (ref 38–126)
ALT: 29 U/L (ref 14–54)
AST: 38 U/L (ref 15–41)
Albumin: 4.2 g/dL (ref 3.5–5.0)
Anion gap: 11 (ref 5–15)
BILIRUBIN TOTAL: 0.5 mg/dL (ref 0.3–1.2)
BUN: 8 mg/dL (ref 6–20)
CHLORIDE: 105 mmol/L (ref 101–111)
CO2: 23 mmol/L (ref 22–32)
Calcium: 9 mg/dL (ref 8.9–10.3)
Creatinine, Ser: 0.77 mg/dL (ref 0.44–1.00)
GFR calc non Af Amer: >60 mL/min (ref >60)
Glucose, Bld: 89 mg/dL (ref 65–99)
Potassium: 2.9 mmol/L — ABNORMAL LOW (ref 3.5–5.1)
SODIUM: 139 mmol/L (ref 135–145)
TOTAL PROTEIN: 8.6 g/dL — AB (ref 6.5–8.1)

## 2014-11-25 LAB — CBC
HCT: 38.2 % (ref 36.0–46.0)
HEMOGLOBIN: 12.9 g/dL (ref 12.0–15.0)
MCH: 30.2 pg (ref 26.0–34.0)
MCHC: 33.8 g/dL (ref 30.0–36.0)
MCV: 89.5 fL (ref 78.0–100.0)
PLATELETS: 224 10*3/uL (ref 150–400)
RBC: 4.27 MIL/uL (ref 3.87–5.11)
RDW: 16.1 % — ABNORMAL HIGH (ref 11.5–15.5)
WBC: 4.8 10*3/uL (ref 4.0–10.5)

## 2014-11-25 LAB — TROPONIN I: Troponin I: 0.03 ng/mL (ref <0.031)

## 2014-11-25 LAB — BRAIN NATRIURETIC PEPTIDE: B Natriuretic Peptide: 202 pg/mL — ABNORMAL HIGH (ref 0.0–100.0)

## 2014-11-25 LAB — ETHANOL: Alcohol, Ethyl (B): 155 mg/dL — ABNORMAL HIGH (ref <5)

## 2014-11-25 MED ORDER — CLOPIDOGREL BISULFATE 75 MG PO TABS: 75.0000 mg | ORAL_TABLET | Freq: Every day | ORAL | Status: DC

## 2014-11-25 MED ORDER — AMLODIPINE BESYLATE 10 MG PO TABS: 10.0000 mg | ORAL_TABLET | Freq: Every day | ORAL | Status: DC

## 2014-11-25 MED ORDER — ATORVASTATIN CALCIUM 20 MG PO TABS: 20.0000 mg | ORAL_TABLET | Freq: Every day | ORAL | Status: DC

## 2014-11-25 MED ORDER — HYDROCHLOROTHIAZIDE 12.5 MG PO TABS: 12.5000 mg | ORAL_TABLET | Freq: Every day | ORAL | Status: DC

## 2014-11-25 NOTE — ED Notes (Signed)
Patient complains of chest pain that started yesterday along with vomiting and shortness of breath. Also reports cough throughout the night and swelling to lower extremities bilaterally.

## 2014-11-25 NOTE — Discharge Instructions (Signed)
Chest Pain (Nonspecific) °It is often hard to give a specific diagnosis for the cause of chest pain. There is always a chance that your pain could be related to something serious, such as a heart attack or a blood clot in the lungs. You need to follow up with your health care provider for further evaluation. °CAUSES  °· Heartburn. °· Pneumonia or bronchitis. °· Anxiety or stress. °· Inflammation around your heart (pericarditis) or lung (pleuritis or pleurisy). °· A blood clot in the lung. °· A collapsed lung (pneumothorax). It can develop suddenly on its own (spontaneous pneumothorax) or from trauma to the chest. °· Shingles infection (herpes zoster virus). °The chest wall is composed of bones, muscles, and cartilage. Any of these can be the source of the pain. °· The bones can be bruised by injury. °· The muscles or cartilage can be strained by coughing or overwork. °· The cartilage can be affected by inflammation and become sore (costochondritis). °DIAGNOSIS  °Lab tests or other studies may be needed to find the cause of your pain. Your health care provider may have you take a test called an ambulatory electrocardiogram (ECG). An ECG records your heartbeat patterns over a 24-hour period. You may also have other tests, such as: °· Transthoracic echocardiogram (TTE). During echocardiography, sound waves are used to evaluate how blood flows through your heart. °· Transesophageal echocardiogram (TEE). °· Cardiac monitoring. This allows your health care provider to monitor your heart rate and rhythm in real time. °· Holter monitor. This is a portable device that records your heartbeat and can help diagnose heart arrhythmias. It allows your health care provider to track your heart activity for several days, if needed. °· Stress tests by exercise or by giving medicine that makes the heart beat faster. °TREATMENT  °· Treatment depends on what may be causing your chest pain. Treatment may include: °¨ Acid blockers for  heartburn. °¨ Anti-inflammatory medicine. °¨ Pain medicine for inflammatory conditions. °¨ Antibiotics if an infection is present. °· You may be advised to change lifestyle habits. This includes stopping smoking and avoiding alcohol, caffeine, and chocolate. °· You may be advised to keep your head raised (elevated) when sleeping. This reduces the chance of acid going backward from your stomach into your esophagus. °Most of the time, nonspecific chest pain will improve within 2-3 days with rest and mild pain medicine.  °HOME CARE INSTRUCTIONS  °· If antibiotics were prescribed, take them as directed. Finish them even if you start to feel better. °· For the next few days, avoid physical activities that bring on chest pain. Continue physical activities as directed. °· Do not use any tobacco products, including cigarettes, chewing tobacco, or electronic cigarettes. °· Avoid drinking alcohol. °· Only take medicine as directed by your health care provider. °· Follow your health care provider's suggestions for further testing if your chest pain does not go away. °· Keep any follow-up appointments you made. If you do not go to an appointment, you could develop lasting (chronic) problems with pain. If there is any problem keeping an appointment, call to reschedule. °SEEK MEDICAL CARE IF:  °· Your chest pain does not go away, even after treatment. °· You have a rash with blisters on your chest. °· You have a fever. °SEEK IMMEDIATE MEDICAL CARE IF:  °· You have increased chest pain or pain that spreads to your arm, neck, jaw, back, or abdomen. °· You have shortness of breath. °· You have an increasing cough, or you cough   up blood.  You have severe back or abdominal pain.  You feel nauseous or vomit.  You have severe weakness.  You faint.  You have chills. This is an emergency. Do not wait to see if the pain will go away. Get medical help at once. Call your local emergency services (911 in [place] Do not drive  yourself to the hospital. MAKE SURE YOU:   Understand these instructions.  Will watch your condition.  Will get help right away if you are not doing well or get worse. Document Released: 01/10/2005 Document Revised: 04/07/2013 Document Reviewed: 11/06/2007 [facility] Patient Information 2015 [place], [place]. This information is not intended to replace advice given to you by your health care provider. Make sure you discuss any questions you have with your health care provider.   Discharge Against Medical Advice I am signing this paper to show that I am leaving this hospital or health care center of my own free will. It is done against all medical advice. In doing so, I am releasing this hospital or health care center and the attending physicians from any and all claims that I may want to make. I understand that further care has been recommended. My condition may worsen. This could cause me further bodily injury, illness, or even death. I do know that the medical staff has fully explained to me the risk that I am taking in leaving against medical advice. Document Released: 04/02/2005 Document Revised: 06/25/2011 Document Reviewed: 09/17/2006 [facility] Patient Information 2015 [place], [place]. This information is not intended to replace advice given to you by your health care provider. Make sure you discuss any questions you have with your health care provider.

## 2014-11-25 NOTE — ED Notes (Addendum)
Pt reporting chest pain for past two days.  Pt also reporting some nausea.  Pt states that she hasn't taken her regular medications for past 3 days.  Pt does have some mild slurred speech, and unsteadiness upon standing.  Pt reports drinking "a little bit" today, would not state how much.

## 2014-11-25 NOTE — ED Notes (Signed)
Pt evaluated by physician.  Pt states that she wants to leave.  Physician discussed ramifications of leaving AMA.  Pt states that she wants to leave.

## 2014-11-25 NOTE — ED Provider Notes (Signed)
This chart was scribed for [place], DO by Hansel Feinstein, ED Scribe. This patient was seen in room APA02/APA02 and the patient's care was started at 11:13 PM.    TIME SEEN: 11:13 PM  CHIEF COMPLAINT:  Chief Complaint  Patient presents with  . Chest Pain    HPI: HPI Comments: Joanna Farmer is a 59 y.o. female who presents to the Emergency Department complaining of moderate centralized, intermittent sharp CP onset today. She states associated SOB, nausea, emesis (2 episodes), diaphoresis, dizziness, productive cough with white phlegm. She notes that pain is relieved by rest and worsened with exertion. Pt notes a Hx of 2 cardiac stents after a NSTEMI? In [place] one year ago. Pt states her current pain feels similar to her past episode. She is not followed by a PCP or Cardiology due to her recent move back to [place]. She states she has been off her medication for 3 days including Plavix. Pt reports that she drank one beer tonight. Last cocaine use 2 months ago. She denies pain radiation, fever.  Also reports bilateral lower extremity swelling.  ROS: See HPI Constitutional: no fever, positive for diaphoresis Eyes: no drainage  ENT: no runny nose   Cardiovascular: positive for sharp chest pain  Resp: positive for SOB, productive cough GI: positive for vomiting, nausea GU: no dysuria Integumentary: no rash  Allergy: no hives  Musculoskeletal: no leg swelling  Neurological: no slurred speech, positive for dizziness  ROS otherwise negative  PAST MEDICAL HISTORY/PAST SURGICAL HISTORY:  Past Medical History  Diagnosis Date  . Hypertension   . Depression   . Hypercholesteremia     MEDICATIONS:  Prior to Admission medications   Medication Sig Start Date End Date Taking? Authorizing Provider  amLODipine (NORVASC) 10 MG tablet Take 10 mg by mouth daily.   Yes Historical Provider, MD  atorvastatin (LIPITOR) 20 MG tablet Take 20 mg by mouth daily.   Yes Historical Provider, MD   clopidogrel (PLAVIX) 75 MG tablet Take 75 mg by mouth daily.   Yes Historical Provider, MD  hydrochlorothiazide (HYDRODIURIL) 12.5 MG tablet Take 12.5 mg by mouth daily.   Yes Historical Provider, MD    ALLERGIES:  No Known Allergies  SOCIAL HISTORY:  Social History  Substance Use Topics  . Smoking status: Current Every Day Smoker -- 0.50 packs/day    Types: Cigarettes  . Smokeless tobacco: Not on file  . Alcohol Use: Yes     Comment: everyday 12 pk daily atleast. " 20 rock" of cocaine smoke     FAMILY HISTORY: History reviewed. No pertinent family history.  EXAM: BP 131/72 mmHg  Pulse 72  Temp(Src) 98.5 F (36.9 C) (Oral)  Resp 20  Ht 4\' 11"  (1.499 m)  Wt 111 lb 9.6 oz (50.621 kg)  BMI 22.53 kg/m2  SpO2 99% CONSTITUTIONAL: Alert and oriented and responds appropriately to questions. Well-appearing; well-nourished, smells of alcohol but does not appear clinically intoxicated HEAD: Normocephalic EYES: Conjunctivae clear, PERRL ENT: normal nose; no rhinorrhea; moist mucous membranes; pharynx without lesions noted NECK: Supple, no meningismus, no LAD  CARD: RRR; S1 and S2 appreciated; no murmurs, no clicks, no rubs, no gallops RESP: Normal chest excursion without splinting or tachypnea; breath sounds clear and equal  bilaterally; no wheezes, no rhonchi, no rales, no hypoxia or respiratory distress, speaking full sentences CHEST:  Mildly tender to palpation the patient reports that this does not reproduce her pain, no crepitus or ecchymosis or deformity ABD/GI: Normal bowel  sounds; non-distended; soft, non-tender, no rebound, no guarding, no peritoneal signs BACK:  The back appears normal and is non-tender to palpation, there is no CVA tenderness EXT: Normal ROM in all joints; non-tender to palpation; no edema; normal capillary refill; no cyanosis, no calf tenderness or swelling    SKIN: Normal color for age and race; warm NEURO: Moves all extremities equally, sensation to  light touch intact diffusely, cranial nerves II through XII intact, normal gait PSYCH: The patient's mood and manner are appropriate. Grooming and personal hygiene are appropriate.  MEDICAL DECISION MAKING: Patient here with chest pain that she states feels similar to her prior anginal pain. Has a history of CAD and has been off of her medication for several days. Also has a history of cocaine abuse but reports she has not used cocaine in 2 months. EKG shows left bundle branch block. Previous EKG showed nonspecific intraventricular conduction delay. Her first troponin is negative - pain has been intermittent for 2 days. Chest x-ray is clear. I have recommended admission for chest pain evaluation but patient refuses.  Discussed with patient that leaving [place] could result in worsening symptoms, severe disability or death. Patient is oriented 3 and appears to have the capacity to make this decision. Discussed also with her niece at bedside who I urged the patient to stay but patient continues to refuse. Will provide patient with local primary care physician follow-up information as well as cardiology follow-up information. Will give her a month's supply of her regular medications. Have discussed with her at length return precautions. She verbalized understanding. We'll have patient sign out [place].  She is able to verbalize risks back to me.  ED PROGRESS: Patient's potassium today is 2.9. Patient did not receive her prescription for potassium prior to leaving the emergency department.      EKG Interpretation  Date/Time:  Thursday November 25 2014 21:37:47 EDT Ventricular Rate:  84 PR Interval:  134 QRS Duration: 162 QT Interval:  450 QTC Calculation: 531 R Axis:   88 Text Interpretation:  Normal sinus rhythm Left bundle branch block Abnormal ECG Confirmed by Viet Kemmerer,  DO, Jaquila Santelli ([id number]) on 11/25/2014 11:30:16 PM        I personally performed the services described in  this documentation, which was scribed in my presence. The recorded information has been reviewed and is accurate.   [place], DO 11/26/14 [phone]

## 2014-12-17 ENCOUNTER — Encounter (INDEPENDENT_AMBULATORY_CARE_PROVIDER_SITE_OTHER): Payer: Self-pay | Admitting: *Deleted

## 2014-12-21 ENCOUNTER — Other Ambulatory Visit (HOSPITAL_COMMUNITY): Payer: Self-pay | Admitting: Internal Medicine

## 2014-12-21 DIAGNOSIS — Z1231 Encounter for screening mammogram for malignant neoplasm of breast: Secondary | ICD-10-CM

## 2014-12-30 ENCOUNTER — Ambulatory Visit (HOSPITAL_COMMUNITY)
Admission: RE | Admit: 2014-12-30 | Discharge: 2014-12-30 | Disposition: A | Discharge status: Home / Self Care | Payer: Medicaid Other | Source: Ambulatory Visit | Attending: Internal Medicine | Admitting: Internal Medicine

## 2014-12-30 ENCOUNTER — Other Ambulatory Visit (INDEPENDENT_AMBULATORY_CARE_PROVIDER_SITE_OTHER): Payer: Self-pay | Admitting: *Deleted

## 2014-12-30 ENCOUNTER — Encounter (INDEPENDENT_AMBULATORY_CARE_PROVIDER_SITE_OTHER): Payer: Self-pay | Admitting: *Deleted

## 2014-12-30 DIAGNOSIS — Z1231 Encounter for screening mammogram for malignant neoplasm of breast: Secondary | ICD-10-CM | POA: Insufficient documentation

## 2014-12-30 DIAGNOSIS — Z1211 Encounter for screening for malignant neoplasm of colon: Secondary | ICD-10-CM

## 2015-01-04 ENCOUNTER — Encounter: Payer: Self-pay | Admitting: *Deleted

## 2015-01-11 ENCOUNTER — Ambulatory Visit (INDEPENDENT_AMBULATORY_CARE_PROVIDER_SITE_OTHER): Payer: Medicaid Other | Admitting: Adult Health

## 2015-01-11 ENCOUNTER — Other Ambulatory Visit (HOSPITAL_COMMUNITY)
Admission: RE | Admit: 2015-01-11 | Discharge: 2015-01-11 | Disposition: A | Discharge status: Home / Self Care | Payer: Medicaid Other | Source: Ambulatory Visit | Attending: Adult Health | Admitting: Adult Health

## 2015-01-11 ENCOUNTER — Encounter: Payer: Self-pay | Admitting: Adult Health

## 2015-01-11 VITALS — BP 140/80 | HR 88 | Ht <= 58 in | Wt 107.5 lb

## 2015-01-11 DIAGNOSIS — Z1211 Encounter for screening for malignant neoplasm of colon: Secondary | ICD-10-CM

## 2015-01-11 DIAGNOSIS — Z0000 Encounter for general adult medical examination without abnormal findings: Secondary | ICD-10-CM

## 2015-01-11 DIAGNOSIS — Z01419 Encounter for gynecological examination (general) (routine) without abnormal findings: Secondary | ICD-10-CM

## 2015-01-11 DIAGNOSIS — Z113 Encounter for screening for infections with a predominantly sexual mode of transmission: Secondary | ICD-10-CM

## 2015-01-11 DIAGNOSIS — Z1151 Encounter for screening for human papillomavirus (HPV): Secondary | ICD-10-CM | POA: Insufficient documentation

## 2015-01-11 DIAGNOSIS — K219 Gastro-esophageal reflux disease without esophagitis: Secondary | ICD-10-CM

## 2015-01-11 DIAGNOSIS — IMO0001 Reserved for inherently not codable concepts without codable children: Secondary | ICD-10-CM

## 2015-01-11 LAB — HEMOCCULT GUIAC POC 1CARD (OFFICE): Fecal Occult Blood, POC: NEGATIVE

## 2015-01-11 NOTE — Progress Notes (Signed)
Patient ID: Joanna Farmer, female   DOB: 22-Nov-1955, 59 y.o.   MRN: [id number] History of Present Illness: Joanna Farmer is a 59 year old black female,separated, new to this practice, in for well woman gyn exam and pap.Joanna Farmer has been in rehab for cocaine and alcohol abuse in past, in [place] and had heart attack there and had 2 stents placed.She requests STD testing. PCP is Dr Legrand Rams.  Current Medications, Allergies, Past Medical History, Past Surgical History, Family History and Social History were reviewed in [employer] record.     Review of Systems: Patient denies any headaches, hearing loss, fatigue, blurred vision, shortness of breath, chest pain, abdominal pain, problems with bowel movements, urination, or intercourse. No joint pain or mood swings.She does complain of being gassy lately and has trouble with getting food down at times, ?reflux and has lost about 7 lbs.She says she sees Dr Laural Golden for colonoscopy 11/10, told to tell him about relux.Also has vaginal itch at times.    Physical Exam:BP 140/80 mmHg  Pulse 88  Ht 4' 9.5" (1.461 m)  Wt 107 lb 8 oz (48.762 kg)  BMI 22.84 kg/m2 General:  Well developed, well nourished, no acute distress Skin:  Warm and dry Neck:  Midline trachea, normal thyroid, good ROM, no lymphadenopathy Lungs; Clear to auscultation bilaterally Breast:  No dominant palpable mass, retraction, or nipple discharge Cardiovascular: Regular rate and rhythm Abdomen:  Soft, non tender,except some mild tenderness mid epi gastric area, no hepatosplenomegaly Pelvic:  External genitalia is normal in appearance, no lesions.  The vagina is normal in appearance. Urethra has no lesions or masses. The cervix is smooth, pap with HPV and GC/CHL performed.  Uterus is felt to be normal size, shape, and contour.  No adnexal masses or tenderness noted.Bladder is non tender, no masses felt. Rectal: Good sphincter tone, no polyps, or hemorrhoids felt.  Hemoccult  negative. Extremities/musculoskeletal:  No swelling or varicosities noted, no clubbing or cyanosis Psych:  No mood changes, alert and cooperative,seems happy   Impression: Well woman gyn exam with pap STD screening Gas Reflux    Plan: Physical in 1 year Mammogram yearly Labs with PCP Colonoscopy with Dr Laural Golden Check HIV,RPR,HSV2 and hept B&C

## 2015-01-11 NOTE — Patient Instructions (Signed)
Physical in 1 year Mammogram yearly Colonoscopy with Dr Laural Golden Labs with pCP

## 2015-01-12 ENCOUNTER — Encounter: Payer: Self-pay | Admitting: Cardiology

## 2015-01-12 ENCOUNTER — Ambulatory Visit (INDEPENDENT_AMBULATORY_CARE_PROVIDER_SITE_OTHER): Payer: Medicaid Other | Admitting: Cardiology

## 2015-01-12 VITALS — BP 120/70 | HR 83 | Ht 59.0 in | Wt 109.4 lb

## 2015-01-12 DIAGNOSIS — I10 Essential (primary) hypertension: Secondary | ICD-10-CM

## 2015-01-12 DIAGNOSIS — Z720 Tobacco use: Secondary | ICD-10-CM | POA: Diagnosis not present

## 2015-01-12 DIAGNOSIS — F1910 Other psychoactive substance abuse, uncomplicated: Secondary | ICD-10-CM

## 2015-01-12 DIAGNOSIS — I2510 Atherosclerotic heart disease of native coronary artery without angina pectoris: Secondary | ICD-10-CM | POA: Diagnosis not present

## 2015-01-12 DIAGNOSIS — R1310 Dysphagia, unspecified: Secondary | ICD-10-CM

## 2015-01-12 LAB — HIV ANTIBODY (ROUTINE TESTING W REFLEX): HIV Screen 4th Generation wRfx: NONREACTIVE

## 2015-01-12 LAB — HEPATITIS C ANTIBODY

## 2015-01-12 LAB — HSV 2 ANTIBODY, IGG: HSV 2 Glycoprotein G Ab, IgG: 7.29 index — ABNORMAL HIGH (ref 0.00–0.90)

## 2015-01-12 LAB — RPR: RPR Ser Ql: NONREACTIVE

## 2015-01-12 LAB — HEPATITIS B SURFACE ANTIBODY, QUANTITATIVE: Hepatitis B Surf Ab Quant: <3.1 m[IU]/mL — ABNORMAL LOW (ref >9.9)

## 2015-01-12 NOTE — Patient Instructions (Signed)
Your physician wants you to follow-up in: 6 months with Dr. Domenic Polite .You will receive a reminder letter in the mail two months in advance. If you don't receive a letter, please call our office to schedule the follow-up appointment.  Your physician recommends that you continue on your current medications as directed. Please refer to the Current Medication list given to you today.  Your physician has requested that you have an echocardiogram. Echocardiography is a painless test that uses sound waves to create images of your heart. It provides your doctor with information about the size and shape of your heart and how well your heart's chambers and valves are working. This procedure takes approximately one hour. There are no restrictions for this procedure.  Thanks for choosing [facility]!!!

## 2015-01-12 NOTE — Progress Notes (Signed)
Cardiology Office Note  Date: 01/12/2015   ID: Joanna Farmer, DOB 1956-04-12, MRN [id number]  PCP: Rosita Fire, MD  Consulting Cardiologist: Rozann Lesches, MD   Chief Complaint  Patient presents with  . Coronary Artery Disease    History of Present Illness: Joanna Farmer is a 59 y.o. female referred for cardiology consultation by Dr. Legrand Rams. I have no cardiac records to review. [name] tells me that she was hospitalized at a facility in [place] last year for drug rehabilitation, and during that time underwent a cardiac evaluation that determined she had had previous "heart attacks," and also resulted in placement of 2 stents. She does not recall any other details.  She has since moved to [place], lives with her niece. She states that she is taking her medications regularly, and establishing regular medical follow-up. She walks for exercise, does not report any active angina symptoms or nitroglycerin use.  Recent ECG from August 2016 showed sinus rhythm with left bundle branch block. Most recent lab work obtained by Dr. Legrand Rams is outlined below.  She has a long history of tobacco abuse, has not been able to quit. We did discuss smoking cessation today.   Past Medical History  Diagnosis Date  . Essential hypertension   . Depression   . Hyperlipidemia   . Reflux   . Alcohol abuse   . History of cocaine use   . CAD (coronary artery disease)     Reportedly 2 stents placed [place], [name]    Past Surgical History  Procedure Laterality Date  . Tubal ligation      Current Outpatient Prescriptions  Medication Sig Dispense Refill  . amLODipine (NORVASC) 10 MG tablet Take 1 tablet (10 mg total) by mouth daily. 30 tablet 0  . aspirin 81 MG tablet Take 81 mg by mouth daily.    [name] buPROPion (WELLBUTRIN XL) 150 MG 24 hr tablet Take 150 mg by mouth 2 (two) times daily.    . clopidogrel (PLAVIX) 75 MG tablet Take 1 tablet (75 mg total) by mouth daily. 30 tablet 0   . hydrochlorothiazide (HYDRODIURIL) 12.5 MG tablet Take 1 tablet (12.5 mg total) by mouth daily. 30 tablet 0   No current facility-administered medications for this visit.    Allergies:  Review of patient's allergies indicates no known allergies.   Social History: The patient  reports that she has been smoking Cigarettes.  She has been smoking about 0.00 packs per day for the past 32 years. She has never used smokeless tobacco. She reports that she drinks alcohol. She reports that she does not use illicit drugs.   Family History: The patient's family history includes Breast cancer in her sister; Hypertension in her brother.   ROS:  Please see the history of present illness. Otherwise, complete review of systems is positive for recent weight loss, reflux and dysphagia..  All other systems are reviewed and negative.   Physical Exam: VS:  BP 120/70 mmHg  Pulse 83  Ht 4\' 11"  (1.499 m)  Wt 109 lb 6.4 oz (49.624 kg)  BMI 22.08 kg/m2  SpO2 98%, BMI Body mass index is 22.08 kg/(m^2).  Wt Readings from Last 3 Encounters:  01/12/15 109 lb 6.4 oz (49.624 kg)  01/11/15 107 lb 8 oz (48.762 kg)  11/25/14 111 lb 9.6 oz (50.621 kg)     General: Patient appears comfortable at rest. HEENT: Conjunctiva and lids normal, oropharynx clear with poor dentition. Neck: Supple, no elevated JVP or carotid  bruits, no thyromegaly. Lungs: Clear to auscultation, nonlabored breathing at rest. Cardiac: Regular rate and rhythm, no S3, paradoxically split S2, no significant systolic murmur, no pericardial rub. Abdomen: Soft, nontender, bowel sounds present, no guarding or rebound. Extremities: No pitting edema, distal pulses 2+. Skin: Warm and dry. Musculoskeletal: No kyphosis. Neuropsychiatric: Alert and oriented x3, affect grossly appropriate.   ECG: ECG is ordered today.  Recent Labwork: 11/25/2014: ALT 29; AST 38; B Natriuretic Peptide 202.0*; BUN 8; Creatinine, Ser 0.77; Hemoglobin 12.9; Platelets 224;  Potassium 2.9*; Sodium 139  September 2016: Cholesterol 170, HDL 113, triglycerides 66, LDL 44, BUN 9, creatinine 0.7, potassium 4.8, AST 26, ALT 19, hemoglobin 13.6, platelets 267   ASSESSMENT AND PLAN:  1. Reported history of CAD status post placement of 2 stents at a facility in [place] last year, details are currently not available regarding the specifics. Patient reports that she was told that she had had previous "heart attacks" at that time. ECG shows sinus rhythm with left bundle branch block. We are going to try and ascertain the name of the facility in [place] where she had her cardiac evaluation done, and request records for review. Echocardiogram will also be obtained to assess LVEF. For now we will continue DAPT, it is possible that she had DES placed and the timing is unknown.  2. Possible history of hyperlipidemia. Recent lipid panel however showed LDL of 44, on no specific medical therapy.  3. Essential hypertension, on Norvasc. Blood pressure is normal today.  4. Tobacco abuse. Smoking cessation discussed.  5. History of polysubstance abuse including marijuana, cocaine, and alcohol based on chart review.  6. Symptoms of reflux, dysphagia, and weight loss. I suggested to her that she speak with Dr. Legrand Rams about referral for gastroenterology evaluation.  Current medicines were reviewed at length with the patient today.   Orders Placed This Encounter  Procedures  . Echocardiogram    Disposition: FU with me in 6 months.   Signed, Satira Sark, MD, [facility] 01/12/2015 9:52 AM    [facility] [address], [place], [place] [address] Phone: [phone]; Fax: [phone]

## 2015-01-13 LAB — CYTOLOGY - PAP

## 2015-01-15 DIAGNOSIS — J189 Pneumonia, unspecified organism: Secondary | ICD-10-CM

## 2015-01-15 HISTORY — DX: Pneumonia, unspecified organism: J18.9

## 2015-01-17 ENCOUNTER — Telehealth: Payer: Self-pay | Admitting: Adult Health

## 2015-01-17 NOTE — Telephone Encounter (Signed)
No voice mail.

## 2015-01-18 ENCOUNTER — Telehealth: Payer: Self-pay

## 2015-01-18 ENCOUNTER — Ambulatory Visit (HOSPITAL_COMMUNITY)
Admission: RE | Admit: 2015-01-18 | Discharge: 2015-01-18 | Disposition: A | Discharge status: Home / Self Care | Payer: Medicaid Other | Source: Ambulatory Visit | Attending: Cardiology | Admitting: Cardiology

## 2015-01-18 DIAGNOSIS — F17200 Nicotine dependence, unspecified, uncomplicated: Secondary | ICD-10-CM | POA: Insufficient documentation

## 2015-01-18 DIAGNOSIS — I10 Essential (primary) hypertension: Secondary | ICD-10-CM | POA: Insufficient documentation

## 2015-01-18 DIAGNOSIS — I2510 Atherosclerotic heart disease of native coronary artery without angina pectoris: Secondary | ICD-10-CM | POA: Insufficient documentation

## 2015-01-18 DIAGNOSIS — I255 Ischemic cardiomyopathy: Secondary | ICD-10-CM

## 2015-01-18 MED ORDER — CARVEDILOL 3.125 MG PO TABS: 3.1250 mg | ORAL_TABLET | Freq: Two times a day (BID) | ORAL | Status: DC

## 2015-01-18 MED ORDER — LOSARTAN POTASSIUM 50 MG PO TABS: 50.0000 mg | ORAL_TABLET | Freq: Every day | ORAL | Status: DC

## 2015-01-18 NOTE — Telephone Encounter (Signed)
-----   Message from Joanna Sark, MD sent at 01/18/2015 12:47 PM EDT ----- Reviewed report. Findings are suggestive of an ischemic cardiomyopathy. Have any records been received from her previous cardiac evaluation in [place] (please see my recent office note)? If not, we need to work on getting these records obtained. She was clinically stable at her office visit. I would recommend medication adjustments for more optimal management of her cardiac status. Please initiate Coreg 3.125 mg twice daily, stop Norvasc, and start Cozaar beginning at 50 mg daily. She needs to have a follow-up BMET in 2 weeks of these changes. Schedule an office follow-up visit in 6 weeks instead of 6 months.

## 2015-01-18 NOTE — Telephone Encounter (Signed)
I spoke with patient. She still doesn't recall which hospital she was in [place].Re-faxed request to [facility].I e-scribed her meds to [facility] in .I have mailed her a letter with her instructions, lab slip and f/u apt is on 11/161/16 at 1:20 pm

## 2015-01-19 ENCOUNTER — Telehealth: Payer: Self-pay | Admitting: Adult Health

## 2015-01-19 ENCOUNTER — Encounter: Payer: Self-pay | Admitting: Adult Health

## 2015-01-19 ENCOUNTER — Telehealth: Payer: Self-pay | Admitting: *Deleted

## 2015-01-19 ENCOUNTER — Telehealth: Payer: Self-pay

## 2015-01-19 NOTE — Telephone Encounter (Signed)
No answer @ 2:08 pm. JSY

## 2015-01-19 NOTE — Telephone Encounter (Signed)
No answer, will send copy in mail

## 2015-01-19 NOTE — Telephone Encounter (Signed)
We attempted to contact the hospital that the PT stated that she had some cardiac procedures, and the hospital states they have no records of the [name] being seen there. Joanna Farmer in [place])

## 2015-01-20 ENCOUNTER — Telehealth (INDEPENDENT_AMBULATORY_CARE_PROVIDER_SITE_OTHER): Payer: Self-pay | Admitting: *Deleted

## 2015-01-20 DIAGNOSIS — Z1211 Encounter for screening for malignant neoplasm of colon: Secondary | ICD-10-CM

## 2015-01-20 NOTE — Telephone Encounter (Signed)
Spoke with pt letting her know her pap was normal. Pt voiced understanding. JSY 

## 2015-01-20 NOTE — Telephone Encounter (Signed)
Patient needs trilyte 

## 2015-01-21 MED ORDER — PEG 3350-KCL-NA BICARB-NACL 420 G PO SOLR: 4000.0000 mL | Freq: Once | ORAL | Status: DC

## 2015-01-26 ENCOUNTER — Telehealth (INDEPENDENT_AMBULATORY_CARE_PROVIDER_SITE_OTHER): Payer: Self-pay | Admitting: *Deleted

## 2015-01-26 NOTE — Telephone Encounter (Signed)
Referring MD/PCP: fanta   Procedure: tcs  Reason/Indication:  screening  Has patient had this procedure before?  no  If so, when, by whom and where?    Is there a family history of colon cancer?  no  Who?  What age when diagnosed?    Is patient diabetic?   no      Does patient have prosthetic heart valve?  no  Do you have a pacemaker?  no  Has patient ever had endocarditis? no  Has patient had joint replacement within last 12 months?  no  Does patient tend to be constipated or take laxatives? no  Does patient have a history of alcohol/drug use? no  Is patient on Coumadin, Plavix and/or Aspirin? yes  Medications: asa 81 mg daily, plavix 75 mg daily, atorvastatin 20 mg daily, amlodipine 10 mg daily  Allergies: nkda  Medication Adjustment: asa 2 days, plavix 5 days  Procedure date & time: 02/24/15 at 930

## 2015-01-27 NOTE — Telephone Encounter (Signed)
agree

## 2015-02-01 ENCOUNTER — Emergency Department (HOSPITAL_COMMUNITY): Payer: Medicaid Other

## 2015-02-01 ENCOUNTER — Encounter (HOSPITAL_COMMUNITY): Payer: Self-pay | Admitting: Cardiology

## 2015-02-01 ENCOUNTER — Observation Stay (HOSPITAL_COMMUNITY)
Admission: EM | Admit: 2015-02-01 | Discharge: 2015-02-03 | Disposition: A | Discharge status: Home / Self Care | Payer: Medicaid Other | Attending: Internal Medicine | Admitting: Internal Medicine

## 2015-02-01 DIAGNOSIS — Z79899 Other long term (current) drug therapy: Secondary | ICD-10-CM | POA: Diagnosis not present

## 2015-02-01 DIAGNOSIS — Z7902 Long term (current) use of antithrombotics/antiplatelets: Secondary | ICD-10-CM | POA: Diagnosis not present

## 2015-02-01 DIAGNOSIS — Z720 Tobacco use: Secondary | ICD-10-CM | POA: Diagnosis not present

## 2015-02-01 DIAGNOSIS — F1010 Alcohol abuse, uncomplicated: Secondary | ICD-10-CM | POA: Diagnosis present

## 2015-02-01 DIAGNOSIS — F329 Major depressive disorder, single episode, unspecified: Secondary | ICD-10-CM | POA: Diagnosis not present

## 2015-02-01 DIAGNOSIS — Z7982 Long term (current) use of aspirin: Secondary | ICD-10-CM | POA: Insufficient documentation

## 2015-02-01 DIAGNOSIS — J189 Pneumonia, unspecified organism: Secondary | ICD-10-CM | POA: Diagnosis not present

## 2015-02-01 DIAGNOSIS — K219 Gastro-esophageal reflux disease without esophagitis: Secondary | ICD-10-CM | POA: Insufficient documentation

## 2015-02-01 DIAGNOSIS — F1410 Cocaine abuse, uncomplicated: Secondary | ICD-10-CM | POA: Diagnosis present

## 2015-02-01 DIAGNOSIS — E785 Hyperlipidemia, unspecified: Secondary | ICD-10-CM | POA: Diagnosis not present

## 2015-02-01 DIAGNOSIS — R079 Chest pain, unspecified: Principal | ICD-10-CM | POA: Diagnosis present

## 2015-02-01 DIAGNOSIS — F10120 Alcohol abuse with intoxication, uncomplicated: Secondary | ICD-10-CM | POA: Insufficient documentation

## 2015-02-01 DIAGNOSIS — Z8619 Personal history of other infectious and parasitic diseases: Secondary | ICD-10-CM | POA: Insufficient documentation

## 2015-02-01 DIAGNOSIS — I2510 Atherosclerotic heart disease of native coronary artery without angina pectoris: Secondary | ICD-10-CM | POA: Diagnosis not present

## 2015-02-01 DIAGNOSIS — I10 Essential (primary) hypertension: Secondary | ICD-10-CM | POA: Diagnosis not present

## 2015-02-01 DIAGNOSIS — F10920 Alcohol use, unspecified with intoxication, uncomplicated: Secondary | ICD-10-CM

## 2015-02-01 LAB — COMPREHENSIVE METABOLIC PANEL
ALBUMIN: 3.7 g/dL (ref 3.5–5.0)
ALT: 16 U/L (ref 14–54)
AST: 30 U/L (ref 15–41)
Alkaline Phosphatase: 63 U/L (ref 38–126)
Anion gap: 13 (ref 5–15)
BUN: 15 mg/dL (ref 6–20)
CHLORIDE: 103 mmol/L (ref 101–111)
CO2: 21 mmol/L — AB (ref 22–32)
CREATININE: 1.09 mg/dL — AB (ref 0.44–1.00)
Calcium: 8.7 mg/dL — ABNORMAL LOW (ref 8.9–10.3)
GFR calc Af Amer: >60 mL/min (ref >60)
GFR calc non Af Amer: 54 mL/min — ABNORMAL LOW (ref >60)
GLUCOSE: 110 mg/dL — AB (ref 65–99)
POTASSIUM: 2.8 mmol/L — AB (ref 3.5–5.1)
SODIUM: 137 mmol/L (ref 135–145)
Total Bilirubin: 0.5 mg/dL (ref 0.3–1.2)
Total Protein: 7.8 g/dL (ref 6.5–8.1)

## 2015-02-01 LAB — PROTIME-INR
INR: 1.02 (ref 0.00–1.49)
Prothrombin Time: 13.6 seconds (ref 11.6–15.2)

## 2015-02-01 LAB — ETHANOL: Alcohol, Ethyl (B): 198 mg/dL — ABNORMAL HIGH (ref <5)

## 2015-02-01 LAB — CBC
HCT: 36.2 % (ref 36.0–46.0)
Hemoglobin: 12.5 g/dL (ref 12.0–15.0)
MCH: 31.6 pg (ref 26.0–34.0)
MCHC: 34.5 g/dL (ref 30.0–36.0)
MCV: 91.6 fL (ref 78.0–100.0)
PLATELETS: 220 10*3/uL (ref 150–400)
RBC: 3.95 MIL/uL (ref 3.87–5.11)
RDW: 16 % — ABNORMAL HIGH (ref 11.5–15.5)
WBC: 5.1 10*3/uL (ref 4.0–10.5)

## 2015-02-01 LAB — RAPID URINE DRUG SCREEN, HOSP PERFORMED
AMPHETAMINES: NOT DETECTED
Barbiturates: NOT DETECTED
Benzodiazepines: NOT DETECTED
COCAINE: NOT DETECTED
OPIATES: NOT DETECTED
TETRAHYDROCANNABINOL: NOT DETECTED

## 2015-02-01 LAB — TROPONIN I: TROPONIN I: 0.04 ng/mL — AB (ref <0.031)

## 2015-02-01 LAB — APTT: APTT: 33 s (ref 24–37)

## 2015-02-01 LAB — I-STAT TROPONIN, ED: Troponin i, poc: 0.02 ng/mL (ref 0.00–0.08)

## 2015-02-01 LAB — MAGNESIUM: Magnesium: 1.7 mg/dL (ref 1.7–2.4)

## 2015-02-01 MED ORDER — MORPHINE SULFATE (PF) 2 MG/ML IV SOLN: 2.0000 mg | INTRAVENOUS | Status: DC | PRN

## 2015-02-01 MED ORDER — ADULT MULTIVITAMIN W/MINERALS CH
1.0000 | ORAL_TABLET | Freq: Every day | ORAL | Status: DC
  Administered 2015-02-01 – 2015-02-03 (×3): 1 via ORAL
  Filled 2015-02-01 (×3): qty 1

## 2015-02-01 MED ORDER — AZITHROMYCIN 250 MG PO TABS
500.0000 mg | ORAL_TABLET | Freq: Once | ORAL | Status: AC
  Administered 2015-02-01: 500 mg via ORAL
  Filled 2015-02-01: qty 2

## 2015-02-01 MED ORDER — DEXTROSE 5 % IV SOLN
1.0000 g | Freq: Once | INTRAVENOUS | Status: AC
  Administered 2015-02-01: 1 g via INTRAVENOUS
  Filled 2015-02-01: qty 10

## 2015-02-01 MED ORDER — HEPARIN SODIUM (PORCINE) 5000 UNIT/ML IJ SOLN
5000.0000 [IU] | Freq: Three times a day (TID) | INTRAMUSCULAR | Status: DC
  Administered 2015-02-01 – 2015-02-03 (×5): 5000 [IU] via SUBCUTANEOUS
  Filled 2015-02-01 (×4): qty 1

## 2015-02-01 MED ORDER — CARVEDILOL 3.125 MG PO TABS
3.1250 mg | ORAL_TABLET | Freq: Two times a day (BID) | ORAL | Status: DC
  Administered 2015-02-01 – 2015-02-02 (×2): 3.125 mg via ORAL
  Filled 2015-02-01 (×2): qty 1

## 2015-02-01 MED ORDER — VITAMIN B-1 100 MG PO TABS
100.0000 mg | ORAL_TABLET | Freq: Every day | ORAL | Status: DC
  Administered 2015-02-01 – 2015-02-03 (×3): 100 mg via ORAL
  Filled 2015-02-01 (×3): qty 1

## 2015-02-01 MED ORDER — NITROGLYCERIN 2 % TD OINT
1.0000 [in_us] | TOPICAL_OINTMENT | Freq: Once | TRANSDERMAL | Status: AC
Start: 2015-02-01 — End: 2015-02-01
  Administered 2015-02-01: 1 [in_us] via TOPICAL
  Filled 2015-02-01: qty 1

## 2015-02-01 MED ORDER — SODIUM CHLORIDE 0.9 % IV SOLN
1000.0000 mL | INTRAVENOUS | Status: DC
  Administered 2015-02-01: 1000 mL via INTRAVENOUS

## 2015-02-01 MED ORDER — THIAMINE HCL 100 MG/ML IJ SOLN
100.0000 mg | Freq: Every day | INTRAMUSCULAR | Status: DC
  Filled 2015-02-01: qty 2

## 2015-02-01 MED ORDER — LORAZEPAM 1 MG PO TABS: 1.0000 mg | ORAL_TABLET | Freq: Four times a day (QID) | ORAL | Status: DC | PRN

## 2015-02-01 MED ORDER — FOLIC ACID 1 MG PO TABS
1.0000 mg | ORAL_TABLET | Freq: Every day | ORAL | Status: DC
  Administered 2015-02-01 – 2015-02-03 (×3): 1 mg via ORAL
  Filled 2015-02-01 (×3): qty 1

## 2015-02-01 MED ORDER — ASPIRIN 81 MG PO CHEW
81.0000 mg | CHEWABLE_TABLET | Freq: Every day | ORAL | Status: DC
  Administered 2015-02-02 – 2015-02-03 (×2): 81 mg via ORAL
  Filled 2015-02-01 (×3): qty 1

## 2015-02-01 MED ORDER — NITROGLYCERIN 2 % TD OINT
1.0000 [in_us] | TOPICAL_OINTMENT | Freq: Four times a day (QID) | TRANSDERMAL | Status: DC
  Administered 2015-02-01 – 2015-02-02 (×2): 1 [in_us] via TOPICAL
  Filled 2015-02-01 (×2): qty 1

## 2015-02-01 MED ORDER — SODIUM CHLORIDE 0.9 % IJ SOLN
3.0000 mL | Freq: Two times a day (BID) | INTRAMUSCULAR | Status: DC
  Administered 2015-02-02 (×2): 3 mL via INTRAVENOUS

## 2015-02-01 MED ORDER — POTASSIUM CHLORIDE 10 MEQ/100ML IV SOLN
10.0000 meq | INTRAVENOUS | Status: AC
  Administered 2015-02-01 – 2015-02-02 (×4): 10 meq via INTRAVENOUS
  Filled 2015-02-01 (×2): qty 100

## 2015-02-01 MED ORDER — POTASSIUM CHLORIDE CRYS ER 20 MEQ PO TBCR
40.0000 meq | EXTENDED_RELEASE_TABLET | Freq: Once | ORAL | Status: AC
  Administered 2015-02-01: 40 meq via ORAL
  Filled 2015-02-01: qty 2

## 2015-02-01 MED ORDER — LOSARTAN POTASSIUM 50 MG PO TABS
50.0000 mg | ORAL_TABLET | Freq: Every day | ORAL | Status: DC
  Administered 2015-02-02 – 2015-02-03 (×2): 50 mg via ORAL
  Filled 2015-02-01 (×2): qty 1

## 2015-02-01 MED ORDER — LORAZEPAM 2 MG/ML IJ SOLN: 1.0000 mg | Freq: Four times a day (QID) | INTRAMUSCULAR | Status: DC | PRN

## 2015-02-01 MED ORDER — ASPIRIN 81 MG PO CHEW
324.0000 mg | CHEWABLE_TABLET | Freq: Once | ORAL | Status: AC
  Administered 2015-02-01: 324 mg via ORAL
  Filled 2015-02-01: qty 4

## 2015-02-01 MED ORDER — LEVOFLOXACIN IN D5W 750 MG/150ML IV SOLN
750.0000 mg | INTRAVENOUS | Status: DC
  Administered 2015-02-02: 750 mg via INTRAVENOUS
  Filled 2015-02-01: qty 150

## 2015-02-01 MED ORDER — POTASSIUM CHLORIDE CRYS ER 20 MEQ PO TBCR
40.0000 meq | EXTENDED_RELEASE_TABLET | Freq: Every day | ORAL | Status: DC
  Administered 2015-02-01 – 2015-02-03 (×3): 40 meq via ORAL
  Filled 2015-02-01 (×3): qty 2

## 2015-02-01 MED ORDER — METOPROLOL TARTRATE 25 MG PO TABS
25.0000 mg | ORAL_TABLET | Freq: Two times a day (BID) | ORAL | Status: DC
  Administered 2015-02-01 – 2015-02-02 (×2): 25 mg via ORAL
  Filled 2015-02-01 (×2): qty 1

## 2015-02-01 MED ORDER — NITROGLYCERIN 0.4 MG SL SUBL
0.4000 mg | SUBLINGUAL_TABLET | SUBLINGUAL | Status: AC | PRN
  Administered 2015-02-01 (×3): 0.4 mg via SUBLINGUAL
  Filled 2015-02-01 (×2): qty 1

## 2015-02-01 MED ORDER — CLOPIDOGREL BISULFATE 75 MG PO TABS
75.0000 mg | ORAL_TABLET | Freq: Every day | ORAL | Status: DC
Start: 2015-02-02 — End: 2015-02-03
  Administered 2015-02-02 – 2015-02-03 (×2): 75 mg via ORAL
  Filled 2015-02-01 (×2): qty 1

## 2015-02-01 MED ORDER — ATORVASTATIN CALCIUM 40 MG PO TABS
40.0000 mg | ORAL_TABLET | Freq: Every day | ORAL | Status: DC
  Administered 2015-02-01 – 2015-02-02 (×2): 40 mg via ORAL
  Filled 2015-02-01 (×2): qty 1

## 2015-02-01 NOTE — ED Notes (Addendum)
Chest pain times 2-3 days.   Weakness, sob and feet swollen.

## 2015-02-01 NOTE — ED Notes (Signed)
Called to give report, RN unavailable to take report at this time.

## 2015-02-01 NOTE — H&P (Signed)
[facility] History and Physical  Joanna Farmer IFO:[id number] DOB: Sep 25, 1955    PCP:   Rosita Fire, MD   Chief Complaint: chest tightness with activities.   HPI: Joanna Farmer is an 59 y.o. female with hx of polysubstance abuse (cig, THC, crack cocaine, alcohol), hx of known CAD s/p 2 stent placement in [place], hx of HTN, HLD, GERD, presented to the ER as she noted for several months, having chest tightness and SOB with activities.  She denied nausea, vomiting or diaphoresis.  She has nonproductive coughs for the past few days, but having no CP or SOB at rest.  Evaluation in the ER included a CXR with infiltrate suggestive of PNA, EKG showed LBBB, and troponin was negative. Her K was low at 2.8, but she has normal WBC, Hb, renal and liver Fx tests.  Hospitalist was asked to admit her for [place] and CAP.   Rewiew of Systems:  Constitutional: Negative for malaise, fever and chills. No significant weight loss or weight gain Eyes: Negative for eye pain, redness and discharge, diplopia, visual changes, or flashes of light. ENMT: Negative for ear pain, hoarseness, nasal congestion, sinus pressure and sore throat. No headaches; tinnitus, drooling, or problem swallowing. Cardiovascular: Negative for palpitations, diaphoresis, dyspnea and peripheral edema. ; No orthopnea, PND Respiratory: Negative for cough, hemoptysis, wheezing and stridor. No pleuritic chestpain. Gastrointestinal: Negative for nausea, vomiting, diarrhea, constipation, abdominal pain, melena, blood in stool, hematemesis, jaundice and rectal bleeding.    Genitourinary: Negative for frequency, dysuria, incontinence,flank pain and hematuria; Musculoskeletal: Negative for back pain and neck pain. Negative for swelling and trauma.;  Skin: . Negative for pruritus, rash, abrasions, bruising and skin lesion.; ulcerations Neuro: Negative for headache, lightheadedness and neck stiffness. Negative for weakness, altered level of  consciousness , altered mental status, extremity weakness, burning feet, involuntary movement, seizure and syncope.  Psych: negative for anxiety, depression, insomnia, tearfulness, panic attacks, hallucinations, paranoia, suicidal or homicidal ideation    Past Medical History  Diagnosis Date  . Essential hypertension   . Depression   . Hyperlipidemia   . Reflux   . Alcohol abuse   . History of cocaine use   . CAD (coronary artery disease)     Reportedly 2 stents placed [place], [name]  . Herpes simplex virus (HSV) infection 2016    +antibodies on blood    Past Surgical History  Procedure Laterality Date  . Tubal ligation    . Stents      Medications:  HOME MEDS: Prior to Admission medications   Medication Sig Start Date End Date Taking? Authorizing Provider  aspirin 81 MG tablet Take 81 mg by mouth daily.   Yes Historical Provider, MD  carvedilol (COREG) 3.125 MG tablet Take 1 tablet (3.125 mg total) by mouth 2 (two) times daily. 01/18/15  Yes Satira Sark, MD  clopidogrel (PLAVIX) 75 MG tablet Take 1 tablet (75 mg total) by mouth daily. 11/25/14  Yes Kristen N Ward, DO  hydrochlorothiazide (HYDRODIURIL) 12.5 MG tablet Take 1 tablet (12.5 mg total) by mouth daily. 11/25/14  Yes Kristen N Ward, DO  losartan (COZAAR) 50 MG tablet Take 1 tablet (50 mg total) by mouth daily. 01/18/15  Yes Satira Sark, MD  polyethylene glycol-electrolytes (NULYTELY/GOLYTELY) 420 G solution Take 4,000 mLs by mouth once. 01/21/15   Butch Penny, NP     Allergies:  No Known Allergies  Social History:   reports that she has been smoking Cigarettes.  She has been  smoking about 0.00 packs per day for the past 32 years. She has never used smokeless tobacco. She reports that she drinks alcohol. She reports that she does not use illicit drugs.  Family History: Family History  Problem Relation Age of Onset  . Breast cancer Sister   . Hypertension Brother      Physical Exam: Filed  Vitals:   02/01/15 1820 02/01/15 1830 02/01/15 1836 02/01/15 1902  BP: 122/63 117/59 119/64   Pulse: 91 91 93   Temp:    99 F (37.2 C)  TempSrc:    Oral  Resp: 21 19 20    Height:      Weight:      SpO2: 97% 94% 98%    Blood pressure 119/64, pulse 93, temperature 99 F (37.2 C), temperature source Oral, resp. rate 20, height 4\' 11"  (1.499 m), weight 49.442 kg (109 lb), SpO2 98 %.  GEN:  Pleasant  patient lying in the stretcher in no acute distress; cooperative with exam. PSYCH:  alert and oriented x4; does not appear anxious or depressed; affect is appropriate. HEENT: Mucous membranes pink and anicteric; PERRLA; EOM intact; no cervical lymphadenopathy nor thyromegaly or carotid bruit; no JVD; There were no stridor. Neck is very supple. Breasts:: Not examined CHEST WALL: No tenderness CHEST: Normal respiration, scattered rhonchi, no rales.  HEART: Regular rate and rhythm.  There are no murmur, rub, or gallops.   BACK: No kyphosis or scoliosis; no CVA tenderness ABDOMEN: soft and non-tender; no masses, no organomegaly, normal abdominal bowel sounds; no pannus; no intertriginous candida. There is no rebound and no distention. Rectal Exam: Not done EXTREMITIES: No bone or joint deformity; age-appropriate arthropathy of the hands and knees; no edema; no ulcerations.  There is no calf tenderness. Genitalia: not examined PULSES: 2+ and symmetric SKIN: Normal hydration no rash or ulceration CNS: Cranial nerves 2-12 grossly intact no focal lateralizing neurologic deficit.  Speech is fluent; uvula elevated with phonation, facial symmetry and tongue midline. DTR are normal bilaterally, cerebella exam is intact, barbinski is negative and strengths are equaled bilaterally.  No sensory loss.   Labs on Admission:  Basic Metabolic Panel:  Recent Labs Lab 02/01/15 1730  NA 137  K 2.8*  CL 103  CO2 21*  GLUCOSE 110*  BUN 15  CREATININE 1.09*  CALCIUM 8.7*  MG 1.7   Liver Function  Tests:  Recent Labs Lab 02/01/15 1730  AST 30  ALT 16  ALKPHOS 63  BILITOT 0.5  PROT 7.8  ALBUMIN 3.7   CBC:  Recent Labs Lab 02/01/15 1730  WBC 5.1  HGB 12.5  HCT 36.2  MCV 91.6  PLT 220    Radiological Exams on Admission: Dg Chest Portable 1 View  02/01/2015  CLINICAL DATA:  Central chest pain for 2-3 days EXAM: PORTABLE CHEST - 1 VIEW COMPARISON:  11/25/2014 FINDINGS: Cardiac shadow remains enlarged. Right basilar infiltrate is seen. No sizable effusion is noted. No bony abnormality is noted. IMPRESSION: Right basilar infiltrate. Electronically Signed   By: Inez Catalina M.D.   On: 02/01/2015 17:55    EKG: Independently reviewed. LBBB.   Assessment/Plan Present on Admission:  . CAP (community acquired pneumonia) . Chest pain on exertion . HTN (hypertension) . Cocaine abuse . Alcohol abuse . Chest pain   PLAN:  CAP: seen by Xray, though not certain given normal WBC, no fever, and minimal coughs.  Will give IV Levoquin.  Tobacco cessation is a must.  Will admit to Dr Legrand Rams  service as per prior arrangement.   [place]:  I think she may have unstable angina.  Will add NTP and Lopressor, start statin with Lipitor.  Will hold off on heparin or Lovenox at this time.  Obtain ECHO and cardiology consultation for nuclear stress test vs cath (she has LBBB).  Continue with ASA and Plavix.   Alcohol abuse:  Will place on CIWA protocol.  HTN:  BP is controlled.  Will add low dose Lopressor.  Low K:  Repleting.   Other plans as per orders.  Code Status: FULL CODE>    Orvan Falconer, MD. [facility] Pager [phone] 7pm to 7am.  02/01/2015, 8:15 PM

## 2015-02-01 NOTE — ED Notes (Signed)
Report given to [facility] on 300

## 2015-02-01 NOTE — ED Provider Notes (Signed)
CSN: [id number]     Arrival date & time 02/01/15  1657 History   First MD Initiated Contact with Patient 02/01/15 1701     Chief Complaint  Patient presents with  . Chest Pain   HPI Patient presents to the emergency room with complaints of chest pain that started about 2-3 days ago. Patient states the pain seems to come on with exertion. She also feels short of breath when this occurs. She has noticed some leg swelling. She's had a slight cough but denies any sputum production. No fevers. No diaphoresis. No nausea or vomiting. The patient called her doctor today and was told to come to the emergency room.  Patient does have a history of coronary artery disease. She has had cardiac stents placed in the past. I was able to review the electronic medical records as well. Patient was seen by [facility] earlier this month.  They did not have any records from the hospital in [place] that the patient was treated at.  Past Medical History  Diagnosis Date  . Essential hypertension   . Depression   . Hyperlipidemia   . Reflux   . Alcohol abuse   . History of cocaine use   . CAD (coronary artery disease)     Reportedly 2 stents placed [place], [name]  . Herpes simplex virus (HSV) infection 2016    +antibodies on blood   Past Surgical History  Procedure Laterality Date  . Tubal ligation    . Stents     Family History  Problem Relation Age of Onset  . Breast cancer Sister   . Hypertension Brother    Social History  Substance Use Topics  . Smoking status: Current Every Day Smoker -- 0.00 packs/day for 32 years    Types: Cigarettes  . Smokeless tobacco: Never Used     Comment: smokes 1-2 cig daily  . Alcohol Use: 0.0 oz/week    0 Standard drinks or equivalent per week     Comment: Daily    OB History    Gravida Para Term Preterm AB TAB SAB Ectopic Multiple Living   3 3        3      Review of Systems    Allergies  Review of patient's allergies indicates no known allergies.  Home  Medications   Prior to Admission medications   Medication Sig Start Date End Date Taking? Authorizing Provider  aspirin 81 MG tablet Take 81 mg by mouth daily.   Yes Historical Provider, MD  carvedilol (COREG) 3.125 MG tablet Take 1 tablet (3.125 mg total) by mouth 2 (two) times daily. 01/18/15  Yes Satira Sark, MD  clopidogrel (PLAVIX) 75 MG tablet Take 1 tablet (75 mg total) by mouth daily. 11/25/14  Yes Kristen N Ward, DO  hydrochlorothiazide (HYDRODIURIL) 12.5 MG tablet Take 1 tablet (12.5 mg total) by mouth daily. 11/25/14  Yes Kristen N Ward, DO  losartan (COZAAR) 50 MG tablet Take 1 tablet (50 mg total) by mouth daily. 01/18/15  Yes Satira Sark, MD  polyethylene glycol-electrolytes (NULYTELY/GOLYTELY) 420 G solution Take 4,000 mLs by mouth once. 01/21/15   Rona Ravens Setzer, NP   BP 119/64 mmHg  Pulse 93  Temp(Src) 99 F (37.2 C) (Oral)  Resp 20  Ht 4\' 11"  (1.499 m)  Wt 109 lb (49.442 kg)  BMI 22.00 kg/m2  SpO2 98% Physical Exam  Constitutional: She appears well-developed and well-nourished. No distress.  HENT:  Head: Normocephalic and atraumatic.  Right Ear: External ear normal.  Left Ear: External ear normal.  Eyes: Conjunctivae are normal. Right eye exhibits no discharge. Left eye exhibits no discharge. No scleral icterus.  Neck: Neck supple. No tracheal deviation present.  Cardiovascular: Normal rate, regular rhythm and intact distal pulses.   Pulmonary/Chest: Effort normal and breath sounds normal. No stridor. No respiratory distress. She has no wheezes. She has no rales.  Abdominal: Soft. Bowel sounds are normal. She exhibits no distension. There is no tenderness. There is no rebound and no guarding.  Musculoskeletal: She exhibits edema (mild edema in the feet and ankles). She exhibits no tenderness.  Neurological: She is alert. She has normal strength. No cranial nerve deficit (no facial droop, extraocular movements intact, no slurred speech) or sensory deficit. She  exhibits normal muscle tone. She displays no seizure activity. Coordination normal.  Skin: Skin is warm and dry. No rash noted.  Psychiatric: She has a normal mood and affect.  Nursing note and vitals reviewed.   ED Course  Procedures (including critical care time) Labs Review Labs Reviewed  CBC - Abnormal; Notable for the following:    RDW 16.0 (*)    All other components within normal limits  COMPREHENSIVE METABOLIC PANEL - Abnormal; Notable for the following:    Potassium 2.8 (*)    CO2 21 (*)    Glucose, Bld 110 (*)    Creatinine, Ser 1.09 (*)    Calcium 8.7 (*)    GFR calc non Af Amer 54 (*)    All other components within normal limits  ETHANOL - Abnormal; Notable for the following:    Alcohol, Ethyl (B) 198 (*)    All other components within normal limits  APTT  PROTIME-INR  URINE RAPID DRUG SCREEN, HOSP PERFORMED  I-STAT TROPOININ, ED    Imaging Review Dg Chest Portable 1 View  02/01/2015  CLINICAL DATA:  Central chest pain for 2-3 days EXAM: PORTABLE CHEST - 1 VIEW COMPARISON:  11/25/2014 FINDINGS: Cardiac shadow remains enlarged. Right basilar infiltrate is seen. No sizable effusion is noted. No bony abnormality is noted. IMPRESSION: Right basilar infiltrate. Electronically Signed   By: Inez Catalina M.D.   On: 02/01/2015 17:55   I have personally reviewed and evaluated these images and lab results as part of my medical decision-making.   EKG Interpretation   Date/Time:  Tuesday February 01 2015 17:06:40 EDT Ventricular Rate:  94 PR Interval:  134 QRS Duration: 165 QT Interval:  423 QTC Calculation: 529 R Axis:   102 Text Interpretation:  Sinus rhythm Left bundle branch block Prolonged QT  interval No significant change since last tracing Confirmed by Linda Biehn   MD-J, Okla Qazi ([id number]) on 02/01/2015 5:09:40 PM      MDM   Final diagnoses:  Chest pain, unspecified chest pain type  Alcohol intoxication, uncomplicated (HCC)   Patient's laboratory tests show mild  hypokalemia.  Her first set of cardiac enzymes are normal. Her alcohol level is elevated.  Patient's symptoms are concerning for the possibility of unstable angina. She does have history of coronary artery disease. Chest x-ray shows possibility of a right basilar infiltrate. The patient is coughing. Her symptoms could be related to a community acquired pneumonia.  However, considering her cardiac history and the anginal type symptoms she describes, i will consult the medical service regarding admission for serial cardiac enzymes and further monitoring.     Dorie Rank, MD 02/01/15 [phone]

## 2015-02-02 ENCOUNTER — Observation Stay (HOSPITAL_BASED_OUTPATIENT_CLINIC_OR_DEPARTMENT_OTHER): Payer: Medicaid Other

## 2015-02-02 DIAGNOSIS — F1410 Cocaine abuse, uncomplicated: Secondary | ICD-10-CM

## 2015-02-02 DIAGNOSIS — F1010 Alcohol abuse, uncomplicated: Secondary | ICD-10-CM

## 2015-02-02 DIAGNOSIS — R079 Chest pain, unspecified: Secondary | ICD-10-CM | POA: Diagnosis not present

## 2015-02-02 DIAGNOSIS — I252 Old myocardial infarction: Secondary | ICD-10-CM

## 2015-02-02 DIAGNOSIS — I25118 Atherosclerotic heart disease of native coronary artery with other forms of angina pectoris: Secondary | ICD-10-CM

## 2015-02-02 DIAGNOSIS — J189 Pneumonia, unspecified organism: Secondary | ICD-10-CM

## 2015-02-02 DIAGNOSIS — I10 Essential (primary) hypertension: Secondary | ICD-10-CM

## 2015-02-02 DIAGNOSIS — I519 Heart disease, unspecified: Secondary | ICD-10-CM

## 2015-02-02 LAB — BASIC METABOLIC PANEL
Anion gap: 10 (ref 5–15)
BUN: 10 mg/dL (ref 6–20)
CALCIUM: 8.6 mg/dL — AB (ref 8.9–10.3)
CO2: 22 mmol/L (ref 22–32)
Chloride: 104 mmol/L (ref 101–111)
Creatinine, Ser: 0.78 mg/dL (ref 0.44–1.00)
GFR calc Af Amer: >60 mL/min (ref >60)
Glucose, Bld: 85 mg/dL (ref 65–99)
POTASSIUM: 3.9 mmol/L (ref 3.5–5.1)
SODIUM: 136 mmol/L (ref 135–145)

## 2015-02-02 LAB — TROPONIN I
Troponin I: 0.04 ng/mL — ABNORMAL HIGH (ref <0.031)
Troponin I: 0.03 ng/mL (ref <0.031)

## 2015-02-02 MED ORDER — CARVEDILOL 3.125 MG PO TABS
6.2500 mg | ORAL_TABLET | Freq: Two times a day (BID) | ORAL | Status: DC
  Administered 2015-02-02 – 2015-02-03 (×2): 6.25 mg via ORAL
  Filled 2015-02-02 (×2): qty 2

## 2015-02-02 NOTE — Progress Notes (Signed)
Patient has been NPO pending cardiology. Dr. Legrand Rams notified. New order for heart healthy diet.

## 2015-02-02 NOTE — Care Management Note (Signed)
Case Management Note  Patient Details  Name: AARVI STOTTS MRN: [id number] Date of Birth: 06-09-1955  Expected Discharge Date:  02/04/15               Expected Discharge Plan:  Home/Self Care  In-House Referral:  NA  Discharge planning Services  CM Consult  Post Acute Care Choice:  NA Choice offered to:  NA  DME Arranged:    DME Agency:     HH Arranged:    [place] Agency:     Status of Service:  Completed, signed off  Medicare Important Message Given:    Date Medicare IM Given:    Medicare IM give by:    Date Additional Medicare IM Given:    Additional Medicare Important Message give by:     If discussed at [place] of Stay Meetings, dates discussed:    Additional Comments: Pt is from home, recently moved from [place] and lives with family. Pt is ind with ADL's. Pt has no HH services, DME's, or med needs prior to admission. Pt plans to return home with self care. No CM needs anticipated.  Sherald Barge, RN 02/02/2015, 10:51 AM

## 2015-02-02 NOTE — Progress Notes (Signed)
Subjective: Patient was admitted due to chest pain and chest tightness. Her chest x-ray showed infiltrate. She is being treated for pneumonia. Patient has also CAD, alcohol and substance abuse. She feels better today.  Objective: Vital signs in last 24 hours: Temp:  [98.7 F (37.1 C)-99.3 F (37.4 C)] 98.7 F (37.1 C) (10/19 0619) Pulse Rate:  [75-98] 75 (10/19 0619) Resp:  [16-22] 18 (10/19 0619) BP: (111-148)/(52-85) 136/59 mmHg (10/19 0619) SpO2:  [87 %-100 %] 97 % (10/19 0619) Weight:  [49.261 kg (108 lb 9.6 oz)-49.442 kg (109 lb)] 49.261 kg (108 lb 9.6 oz) (10/18 2200) Weight change:  Last BM Date: 02/01/15  Intake/Output from previous day:    PHYSICAL EXAM General appearance: alert and no distress Resp: diminished breath sounds bilaterally and rhonchi bilaterally Cardio: S1, S2 normal GI: soft, non-tender; bowel sounds normal; no masses,  no organomegaly Extremities: extremities normal, atraumatic, no cyanosis or edema  Lab Results:  Results for orders placed or performed during the hospital encounter of 02/01/15 (from the past 48 hour(s))  APTT     Status: None   Collection Time: 02/01/15  5:30 PM  Result Value Ref Range   aPTT 33 24 - 37 seconds  CBC     Status: Abnormal   Collection Time: 02/01/15  5:30 PM  Result Value Ref Range   WBC 5.1 4.0 - 10.5 K/uL   RBC 3.95 3.87 - 5.11 MIL/uL   Hemoglobin 12.5 12.0 - 15.0 g/dL   HCT 36.2 36.0 - 46.0 %   MCV 91.6 78.0 - 100.0 fL   MCH 31.6 26.0 - 34.0 pg   MCHC 34.5 30.0 - 36.0 g/dL   RDW 16.0 (H) 11.5 - 15.5 %   Platelets 220 150 - 400 K/uL  Comprehensive metabolic panel     Status: Abnormal   Collection Time: 02/01/15  5:30 PM  Result Value Ref Range   Sodium 137 135 - 145 mmol/L   Potassium 2.8 (L) 3.5 - 5.1 mmol/L   Chloride 103 101 - 111 mmol/L   CO2 21 (L) 22 - 32 mmol/L   Glucose, Bld 110 (H) 65 - 99 mg/dL   BUN 15 6 - 20 mg/dL   Creatinine, Ser 1.09 (H) 0.44 - 1.00 mg/dL   Calcium 8.7 (L) 8.9 - 10.3  mg/dL   Total Protein 7.8 6.5 - 8.1 g/dL   Albumin 3.7 3.5 - 5.0 g/dL   AST 30 15 - 41 U/L   ALT 16 14 - 54 U/L   Alkaline Phosphatase 63 38 - 126 U/L   Total Bilirubin 0.5 0.3 - 1.2 mg/dL   GFR calc non Af Amer 54 (L) >60 mL/min   GFR calc Af Amer >60 >60 mL/min    Comment: (NOTE) The eGFR has been calculated using the CKD EPI equation. This calculation has not been validated in all clinical situations. eGFR's persistently <60 mL/min signify possible Chronic Kidney Disease.    Anion gap 13 5 - 15  Protime-INR     Status: None   Collection Time: 02/01/15  5:30 PM  Result Value Ref Range   Prothrombin Time 13.6 11.6 - 15.2 seconds   INR 1.02 0.00 - 1.49  I-stat troponin, ED (0, 3, 6) - do not order at [facility]     Status: None   Collection Time: 02/01/15  5:30 PM  Result Value Ref Range   Troponin i, poc 0.02 0.00 - 0.08 ng/mL   Comment 3  Comment: Due to the release kinetics of cTnI, a negative result within the first hours of the onset of symptoms does not rule out myocardial infarction with certainty. If myocardial infarction is still suspected, repeat the test at appropriate intervals.   Ethanol     Status: Abnormal   Collection Time: 02/01/15  5:30 PM  Result Value Ref Range   Alcohol, Ethyl (B) 198 (H) <5 mg/dL    Comment:        LOWEST DETECTABLE LIMIT FOR SERUM ALCOHOL IS 5 mg/dL FOR MEDICAL PURPOSES ONLY   Magnesium     Status: None   Collection Time: 02/01/15  5:30 PM  Result Value Ref Range   Magnesium 1.7 1.7 - 2.4 mg/dL  Urine rapid drug screen (hosp performed)     Status: None   Collection Time: 02/01/15  6:50 PM  Result Value Ref Range   Opiates NONE DETECTED NONE DETECTED   Cocaine NONE DETECTED NONE DETECTED   Benzodiazepines NONE DETECTED NONE DETECTED   Amphetamines NONE DETECTED NONE DETECTED   Tetrahydrocannabinol NONE DETECTED NONE DETECTED   Barbiturates NONE DETECTED NONE DETECTED    Comment:        DRUG SCREEN FOR MEDICAL  PURPOSES ONLY.  IF CONFIRMATION IS NEEDED FOR ANY PURPOSE, NOTIFY LAB WITHIN 5 DAYS.        LOWEST DETECTABLE LIMITS FOR URINE DRUG SCREEN Drug Class       Cutoff (ng/mL) Amphetamine      1000 Barbiturate      200 Benzodiazepine   716 Tricyclics       967 Opiates          300 Cocaine          300 THC              50   Troponin I     Status: Abnormal   Collection Time: 02/01/15  9:47 PM  Result Value Ref Range   Troponin I 0.04 (H) <0.031 ng/mL    Comment:        PERSISTENTLY INCREASED TROPONIN VALUES IN THE RANGE OF 0.04-0.49 ng/mL CAN BE SEEN IN:       -UNSTABLE ANGINA       -CONGESTIVE HEART FAILURE       -MYOCARDITIS       -CHEST TRAUMA       -ARRYHTHMIAS       -LATE PRESENTING MYOCARDIAL INFARCTION       -COPD   CLINICAL FOLLOW-UP RECOMMENDED.   Troponin I     Status: Abnormal   Collection Time: 02/02/15  3:42 AM  Result Value Ref Range   Troponin I 0.04 (H) <0.031 ng/mL    Comment:        PERSISTENTLY INCREASED TROPONIN VALUES IN THE RANGE OF 0.04-0.49 ng/mL CAN BE SEEN IN:       -UNSTABLE ANGINA       -CONGESTIVE HEART FAILURE       -MYOCARDITIS       -CHEST TRAUMA       -ARRYHTHMIAS       -LATE PRESENTING MYOCARDIAL INFARCTION       -COPD   CLINICAL FOLLOW-UP RECOMMENDED.     ABGS No results for input(s): PHART, PO2ART, TCO2, HCO3 in the last 72 hours.  Invalid input(s): PCO2 CULTURES No results found for this or any previous visit (from the past 240 hour(s)). Studies/Results: Dg Chest Portable 1 View  02/01/2015  CLINICAL DATA:  Central chest pain for 2-3 days  EXAM: PORTABLE CHEST - 1 VIEW COMPARISON:  11/25/2014 FINDINGS: Cardiac shadow remains enlarged. Right basilar infiltrate is seen. No sizable effusion is noted. No bony abnormality is noted. IMPRESSION: Right basilar infiltrate. Electronically Signed   By: Inez Catalina M.D.   On: 02/01/2015 17:55    Medications: I have reviewed the patient's current medications.  Assesment:   Principal  Problem:   Alcohol abuse Active Problems:   Cocaine abuse   HTN (hypertension)   CAP (community acquired pneumonia)   Chest pain on exertion   Chest pain    Plan:  Medications reviewed Continue Iv antibiotics Cardiology consult pending Will monitor CBC/BMP      Ramsey Guadamuz 02/02/2015, 8:26 AM

## 2015-02-02 NOTE — Consult Note (Signed)
Reason for Consult: Chest pain Referring Physician: Dr. Legrand Rams Cardiologist: Dr. Domenic Polite Consulting cardiologist: Joanna Farmer is an 59 y.o. female.  HPI: This is a 59 year old female patient who was recently seen by Dr. Domenic Polite for the first time on 01/12/15. She moved here from [facility] where she apparently had an MI and 2 stents placed. She can't recall which hospital and we've been unable to obtain records. 2-D echo on 01/18/15 was suggestive of an ischemic cardiomyopathy with severe LV dysfunction EF 25-30% and grade 1 diastolic dysfunction. She had severe hypokinesis of the entire anterior myocardium and moderate hypokinesis of the basal mid anterior septal, mid inferior lateral and mid anterior lateral myocardium. She was placed on Coreg and Cozaar. She also has a history of polysubstance abuse including tobacco, cocaine and alcohol. She has history of hypertension.  Patient presents with back pain into chest described as a sharp shooting pain that would come and go and started about 3 days ago. She has chronic exertional chest tightness and shortness of breath with little activity. She also complains of leg and foot pain that limits her ability to walk. She eats a lot of salt and her legs sometime swell. She smokes 4-5 cigarettes/day, drinks 3-4 beers/day, but says she no longer does cocaine or other illicit drugs.She's also had nonproductive cough and chest x-ray is suggestive of pneumonia. EKG shows left bundle branch block which is chronic. She was placed on IV nitroglycerin for unstable angina. Potassium was low at 2.8.Troponin 0.02, 0.04, 0.04.    Past Medical History  Diagnosis Date  . Essential hypertension   . Depression   . Hyperlipidemia   . Reflux   . Alcohol abuse   . History of cocaine use   . CAD (coronary artery disease)     Reportedly 2 stents placed [name]  . Herpes simplex virus (HSV) infection 2016    +antibodies on blood     Past Surgical History  Procedure Laterality Date  . Tubal ligation    . Stents      Family History  Problem Relation Age of Onset  . Breast cancer Sister   . Hypertension Brother     Social History:  reports that she has been smoking Cigarettes.  She has been smoking about 0.00 packs per day for the past 32 years. She has never used smokeless tobacco. She reports that she drinks alcohol. She reports that she does not use illicit drugs.  Allergies: No Known Allergies  Medications: Scheduled Meds: . aspirin  81 mg Oral Daily  . atorvastatin  40 mg Oral q1800  . carvedilol  3.125 mg Oral BID WC  . clopidogrel  75 mg Oral Daily  . folic acid  1 mg Oral Daily  . heparin  5,000 Units Subcutaneous 3 times per day  . levofloxacin  750 mg Intravenous Q24H  . losartan  50 mg Oral Daily  . metoprolol tartrate  25 mg Oral BID  . multivitamin with minerals  1 tablet Oral Daily  . nitroGLYCERIN  1 inch Topical 4 times per day  . potassium chloride  40 mEq Oral Daily  . sodium chloride  3 mL Intravenous Q12H  . thiamine  100 mg Oral Daily   Or  . thiamine  100 mg Intravenous Daily   Continuous Infusions: . sodium chloride 1,000 mL (02/01/15 1739)   PRN Meds:.LORazepam **OR** LORazepam, morphine injection   Results for orders placed or performed during the hospital encounter  of 02/01/15 (from the past 48 hour(s))  APTT     Status: None   Collection Time: 02/01/15  5:30 PM  Result Value Ref Range   aPTT 33 24 - 37 seconds  CBC     Status: Abnormal   Collection Time: 02/01/15  5:30 PM  Result Value Ref Range   WBC 5.1 4.0 - 10.5 K/uL   RBC 3.95 3.87 - 5.11 MIL/uL   Hemoglobin 12.5 12.0 - 15.0 g/dL   HCT 36.2 36.0 - 46.0 %   MCV 91.6 78.0 - 100.0 fL   MCH 31.6 26.0 - 34.0 pg   MCHC 34.5 30.0 - 36.0 g/dL   RDW 16.0 (H) 11.5 - 15.5 %   Platelets 220 150 - 400 K/uL  Comprehensive metabolic panel     Status: Abnormal   Collection Time: 02/01/15  5:30 PM  Result Value Ref  Range   Sodium 137 135 - 145 mmol/L   Potassium 2.8 (L) 3.5 - 5.1 mmol/L   Chloride 103 101 - 111 mmol/L   CO2 21 (L) 22 - 32 mmol/L   Glucose, Bld 110 (H) 65 - 99 mg/dL   BUN 15 6 - 20 mg/dL   Creatinine, Ser 1.09 (H) 0.44 - 1.00 mg/dL   Calcium 8.7 (L) 8.9 - 10.3 mg/dL   Total Protein 7.8 6.5 - 8.1 g/dL   Albumin 3.7 3.5 - 5.0 g/dL   AST 30 15 - 41 U/L   ALT 16 14 - 54 U/L   Alkaline Phosphatase 63 38 - 126 U/L   Total Bilirubin 0.5 0.3 - 1.2 mg/dL   GFR calc non Af Amer 54 (L) >60 mL/min   GFR calc Af Amer >60 >60 mL/min    Comment: (NOTE) The eGFR has been calculated using the CKD EPI equation. This calculation has not been validated in all clinical situations. eGFR's persistently <60 mL/min signify possible Chronic Kidney Disease.    Anion gap 13 5 - 15  Protime-INR     Status: None   Collection Time: 02/01/15  5:30 PM  Result Value Ref Range   Prothrombin Time 13.6 11.6 - 15.2 seconds   INR 1.02 0.00 - 1.49  I-stat troponin, ED (0, 3, 6) - do not order at [facility]     Status: None   Collection Time: 02/01/15  5:30 PM  Result Value Ref Range   Troponin i, poc 0.02 0.00 - 0.08 ng/mL   Comment 3            Comment: Due to the release kinetics of cTnI, a negative result within the first hours of the onset of symptoms does not rule out myocardial infarction with certainty. If myocardial infarction is still suspected, repeat the test at appropriate intervals.   Ethanol     Status: Abnormal   Collection Time: 02/01/15  5:30 PM  Result Value Ref Range   Alcohol, Ethyl (B) 198 (H) <5 mg/dL    Comment:        LOWEST DETECTABLE LIMIT FOR SERUM ALCOHOL IS 5 mg/dL FOR MEDICAL PURPOSES ONLY   Magnesium     Status: None   Collection Time: 02/01/15  5:30 PM  Result Value Ref Range   Magnesium 1.7 1.7 - 2.4 mg/dL  Urine rapid drug screen (hosp performed)     Status: None   Collection Time: 02/01/15  6:50 PM  Result Value Ref Range   Opiates NONE DETECTED NONE DETECTED    Cocaine NONE DETECTED NONE DETECTED  Benzodiazepines NONE DETECTED NONE DETECTED   Amphetamines NONE DETECTED NONE DETECTED   Tetrahydrocannabinol NONE DETECTED NONE DETECTED   Barbiturates NONE DETECTED NONE DETECTED    Comment:        DRUG SCREEN FOR MEDICAL PURPOSES ONLY.  IF CONFIRMATION IS NEEDED FOR ANY PURPOSE, NOTIFY LAB WITHIN 5 DAYS.        LOWEST DETECTABLE LIMITS FOR URINE DRUG SCREEN Drug Class       Cutoff (ng/mL) Amphetamine      1000 Barbiturate      200 Benzodiazepine   016 Tricyclics       010 Opiates          300 Cocaine          300 THC              50   Troponin I     Status: Abnormal   Collection Time: 02/01/15  9:47 PM  Result Value Ref Range   Troponin I 0.04 (H) <0.031 ng/mL    Comment:        PERSISTENTLY INCREASED TROPONIN VALUES IN THE RANGE OF 0.04-0.49 ng/mL CAN BE SEEN IN:       -UNSTABLE ANGINA       -CONGESTIVE HEART FAILURE       -MYOCARDITIS       -CHEST TRAUMA       -ARRYHTHMIAS       -LATE PRESENTING MYOCARDIAL INFARCTION       -COPD   CLINICAL FOLLOW-UP RECOMMENDED.   Troponin I     Status: Abnormal   Collection Time: 02/02/15  3:42 AM  Result Value Ref Range   Troponin I 0.04 (H) <0.031 ng/mL    Comment:        PERSISTENTLY INCREASED TROPONIN VALUES IN THE RANGE OF 0.04-0.49 ng/mL CAN BE SEEN IN:       -UNSTABLE ANGINA       -CONGESTIVE HEART FAILURE       -MYOCARDITIS       -CHEST TRAUMA       -ARRYHTHMIAS       -LATE PRESENTING MYOCARDIAL INFARCTION       -COPD   CLINICAL FOLLOW-UP RECOMMENDED.     Dg Chest Portable 1 View  02/01/2015  CLINICAL DATA:  Central chest pain for 2-3 days EXAM: PORTABLE CHEST - 1 VIEW COMPARISON:  11/25/2014 FINDINGS: Cardiac shadow remains enlarged. Right basilar infiltrate is seen. No sizable effusion is noted. No bony abnormality is noted. IMPRESSION: Right basilar infiltrate. Electronically Signed   By: Inez Catalina M.D.   On: 02/01/2015 17:55    ROS  See HPI Eyes:  Negative Ears:Negative for hearing loss, tinnitus Cardiovascular: Negative for  palpitations,irregular heartbeat, near-syncope, orthopnea, paroxysmal nocturnal dyspnea and syncope,edema, claudication, cyanosis,.  Respiratory:   Negative for  hemoptysis,  sleep disturbances due to breathing, sputum production and wheezing.   Endocrine: Negative for cold intolerance and heat intolerance.  Hematologic/Lymphatic: Negative for adenopathy and bleeding problem. Does not bruise/bleed easily.  Musculoskeletal:weak, leg pain.   Gastrointestinal: Negative for nausea, vomiting, reflux, abdominal pain, diarrhea, constipation.   Genitourinary: Negative for bladder incontinence, dysuria, flank pain, frequency, hematuria, hesitancy, nocturia and urgency.  Neurological: Negative.  Allergic/Immunologic: Negative for environmental allergies.  Blood pressure 136/59, pulse 75, temperature 98.7 F (37.1 C), temperature source Oral, resp. rate 18, height _0  (1.499 m), weight 108 lb 9.6 oz (49.261 kg), SpO2 97 %. Physical Exam PHYSICAL EXAM: Well-nournished, in no acute distress. Neck: No JVD,  HJR, Bruit, or thyroid enlargement Lungs:Decreased breath sounds throughout with crackles right mid lung. Cardiovascular: RRR, positive S4, 2/6 systolic murmur LSB, no bruit, thrill, or heave. Abdomen: BS normal. Soft without organomegaly, masses, lesions or tenderness. Extremities: without cyanosis, clubbing or edema. Good distal pulses bilateral SKin: Warm, no lesions or rashes  Musculoskeletal: No deformities Neuro: no focal signs  2Decho 01/12/15: Study Conclusions  - Left ventricle: The cavity size was mildly to moderately dilated.   Wall thickness was normal. Systolic function was severely   reduced. The estimated ejection fraction was in the range of 25%   to 30%. Doppler parameters are consistent with abnormal left   ventricular relaxation (grade 1 diastolic dysfunction). - Regional wall motion abnormality:  Severe hypokinesis of the   entire anterior myocardium; moderate hypokinesis of the basal-mid   anteroseptal, mid inferolateral, and mid anterolateral   myocardium; mild hypokinesis of the mid inferoseptal and basal   inferolateral myocardium. - Ventricular septum: Septal motion showed abnormal function and   dyssynergy. These changes are consistent with a left bundle   branch block. - Aortic valve: Mildly calcified annulus. Trileaflet. - Aorta: Mild aortic root dilatation. Aortic root dimension: 41 mm   (ED). - Mitral valve: There was mild regurgitation    Assessment/Plan: Chest pain on admission sounds secondary to pneumonia, but she does have chronic exertional angina. With flat troponins, would treat pneumonia and evaluate further as outpatient with lexiscan myoview.  Ischemic Cardiomyopathy: history of MI in [place] with 2 Stents-have been unable to locate records. Will try again. EF 25-30% on recent 2Decho with multiple WMA. See above. Titrate Coreg up, d/c lopressor and NTG paste. Could add Imdur, but will wait.  Community acquired pneumonia  ETOH abuse  Tobacco abuse smoking cessation discussed.  HTN elevated-titrate coreg, stop lopressor  Hyperlipidemia on Lipitor  Hypokalemia: replaced  Ermalinda Barrios 02/02/2015, 8:10 AM   The patient was seen and examined, and I agree with the assessment and plan as documented above. Pt admitted with chest pain in context of CAD, prior PCI, and severe ischemic cardiomyopathy. Troponins demonstrate flat curve 0.04-->0.04-->0.03. Chest xray shows right basilar infiltrate. Currently being treated with IV Levaquin. I agree that patient has stable angina pectoris and with elevated BP, agree with increasing Coreg. However, most of her symptoms appear to be consistent with community acquired pneumonia. Can follow up with Dr. Domenic Polite as outpatient and obtain a nuclear stress test. Still attempting to obtain cardiac records from [place],  [place].

## 2015-02-03 LAB — BASIC METABOLIC PANEL
ANION GAP: 10 (ref 5–15)
BUN: 11 mg/dL (ref 6–20)
CALCIUM: 8.7 mg/dL — AB (ref 8.9–10.3)
CHLORIDE: 103 mmol/L (ref 101–111)
CO2: 23 mmol/L (ref 22–32)
CREATININE: 0.8 mg/dL (ref 0.44–1.00)
GFR calc Af Amer: >60 mL/min (ref >60)
GFR calc non Af Amer: >60 mL/min (ref >60)
Glucose, Bld: 90 mg/dL (ref 65–99)
POTASSIUM: 3.8 mmol/L (ref 3.5–5.1)
SODIUM: 136 mmol/L (ref 135–145)

## 2015-02-03 LAB — CBC
HCT: 41.7 % (ref 36.0–46.0)
HEMOGLOBIN: 14.3 g/dL (ref 12.0–15.0)
MCH: 31.6 pg (ref 26.0–34.0)
MCHC: 34.3 g/dL (ref 30.0–36.0)
MCV: 92.1 fL (ref 78.0–100.0)
PLATELETS: 236 10*3/uL (ref 150–400)
RBC: 4.53 MIL/uL (ref 3.87–5.11)
RDW: 15.9 % — ABNORMAL HIGH (ref 11.5–15.5)
WBC: 4.4 10*3/uL (ref 4.0–10.5)

## 2015-02-03 MED ORDER — LEVOFLOXACIN 500 MG PO TABS: 500.0000 mg | ORAL_TABLET | Freq: Every day | ORAL | Status: DC

## 2015-02-03 NOTE — Progress Notes (Signed)
Patient alert and oriented, independent, VSS, pt. Tolerating diet well. No complaints of pain or nausea. Pt. Had IV removed tip intact. Pt. Had prescriptions given. Pt. Voiced understanding of discharge instructions with no further questions. Pt. Discharged via wheelchair with auxilliary.  

## 2015-02-03 NOTE — Care Management Note (Signed)
Case Management Note  Patient Details  Name: MARCELLINE TEMKIN MRN: [id number] Date of Birth: 10-14-1955  Subjective/Objective:                    Action/Plan:  Pt discharging home today with self care. No CM needs noted   Expected Discharge Date:  02/04/15               Expected Discharge Plan:  Home/Self Care  In-House Referral:  NA  Discharge planning Services  CM Consult  Post Acute Care Choice:  NA Choice offered to:  NA  DME Arranged:    DME Agency:     HH Arranged:    HH Agency:     Status of Service:  Completed, signed off  Medicare Important Message Given:    Date Medicare IM Given:    Medicare IM give by:    Date Additional Medicare IM Given:    Additional Medicare Important Message give by:     If discussed at [place] of Stay Meetings, dates discussed:    Additional Comments:  Sherald Barge, RN 02/03/2015, 9:42 AM

## 2015-02-03 NOTE — Progress Notes (Signed)
Pt's IV catheter removed and intact. Pt's IV site clean dry and intact. Discharge instructions, medications and follow up appointments reviewed and discussed with patient. All questions were answered and no further questions at this time. Pt verbalized understanding of discharge instructions. Pt in stable condition at time of discharge and in no acute distress. Pt will be escorted by nurse tech.

## 2015-02-03 NOTE — Discharge Summary (Signed)
Physician Discharge Summary  Patient ID: Joanna Farmer MRN: [id number] DOB/AGE: [birth year] 59 y.o. Primary Care Physician:Clydie Dillen, MD Admit date: 02/01/2015 Discharge date: 02/03/2015    Discharge Diagnoses:   Principal Problem:   Alcohol abuse Active Problems:   Cocaine abuse   HTN (hypertension)   CAP (community acquired pneumonia)   Chest pain on exertion   Chest pain     Medication List    TAKE these medications        aspirin 81 MG tablet  Take 81 mg by mouth daily.     carvedilol 3.125 MG tablet  Commonly known as:  COREG  Take 1 tablet (3.125 mg total) by mouth 2 (two) times daily.     clopidogrel 75 MG tablet  Commonly known as:  PLAVIX  Take 1 tablet (75 mg total) by mouth daily.     hydrochlorothiazide 12.5 MG tablet  Commonly known as:  HYDRODIURIL  Take 1 tablet (12.5 mg total) by mouth daily.     levofloxacin 500 MG tablet  Commonly known as:  LEVAQUIN  Take 1 tablet (500 mg total) by mouth daily.     losartan 50 MG tablet  Commonly known as:  COZAAR  Take 1 tablet (50 mg total) by mouth daily.     polyethylene glycol-electrolytes 420 G solution  Commonly known as:  NuLYTELY/GoLYTELY  Take 4,000 mLs by mouth once.        Discharged Condition: home    Consults: cardiology  Significant Diagnostic Studies: Dg Chest Portable 1 View  02/01/2015  CLINICAL DATA:  Central chest pain for 2-3 days EXAM: PORTABLE CHEST - 1 VIEW COMPARISON:  11/25/2014 FINDINGS: Cardiac shadow remains enlarged. Right basilar infiltrate is seen. No sizable effusion is noted. No bony abnormality is noted. IMPRESSION: Right basilar infiltrate. Electronically Signed   By: Inez Catalina M.D.   On: 02/01/2015 17:55    Lab Results: Basic Metabolic Panel:  Recent Labs  02/01/15 1730 02/02/15 0951 02/03/15 0450  NA 137 136 136  K 2.8* 3.9 3.8  CL 103 104 103  CO2 21* 22 23  GLUCOSE 110* 85 90  BUN 15 10 11   CREATININE 1.09* 0.78 0.80  CALCIUM 8.7* 8.6*  8.7*  MG 1.7  --   --    Liver Function Tests:  Recent Labs  02/01/15 1730  AST 30  ALT 16  ALKPHOS 63  BILITOT 0.5  PROT 7.8  ALBUMIN 3.7     CBC:  Recent Labs  02/01/15 1730 02/03/15 0450  WBC 5.1 4.4  HGB 12.5 14.3  HCT 36.2 41.7  MCV 91.6 92.1  PLT 220 236    No results found for this or any previous visit (from the past 240 hour(s)).   Hospital Course:   [name] with history of multiple medical illnesses was admitted due to chest tightness and shortness of breath. Her chest x-ray showed infiltrate suggestive of pneumonia. Patient was started on Iv antibiotics and she was also evaluated by cardiology. Patient overall improved. She is being discharged on oral antibiotics.  Discharge Exam: Blood pressure 122/62, pulse 74, temperature 98.6 F (37 C), temperature source Oral, resp. rate 18, height 4\' 11"  (1.499 m), weight 49.261 kg (108 lb 9.6 oz), SpO2 99 %.   Disposition:  home        Follow-up Information    Follow up with [facility], MD In 1 week.   Specialty:  Internal Medicine   Contact information:  [place] [address] [phone]       Signed: Rosita Fire   02/03/2015, 8:18 AM

## 2015-02-05 LAB — BASIC METABOLIC PANEL
BUN: 11 mg/dL (ref 7–25)
CALCIUM: 9.3 mg/dL (ref 8.6–10.4)
CO2: 26 mmol/L (ref 20–31)
Chloride: 103 mmol/L (ref 98–110)
Creat: 0.8 mg/dL (ref 0.50–1.05)
GLUCOSE: 76 mg/dL (ref 65–99)
Potassium: 3.9 mmol/L (ref 3.5–5.3)
Sodium: 138 mmol/L (ref 135–146)

## 2015-03-02 ENCOUNTER — Other Ambulatory Visit: Payer: Self-pay | Admitting: Cardiology

## 2015-03-02 ENCOUNTER — Ambulatory Visit (INDEPENDENT_AMBULATORY_CARE_PROVIDER_SITE_OTHER): Payer: Medicaid Other | Admitting: Cardiology

## 2015-03-02 ENCOUNTER — Encounter: Payer: Self-pay | Admitting: Cardiology

## 2015-03-02 VITALS — BP 116/64 | HR 89 | Ht 59.0 in | Wt 106.6 lb

## 2015-03-02 DIAGNOSIS — Z720 Tobacco use: Secondary | ICD-10-CM

## 2015-03-02 DIAGNOSIS — I10 Essential (primary) hypertension: Secondary | ICD-10-CM

## 2015-03-02 DIAGNOSIS — F1910 Other psychoactive substance abuse, uncomplicated: Secondary | ICD-10-CM

## 2015-03-02 DIAGNOSIS — I255 Ischemic cardiomyopathy: Secondary | ICD-10-CM | POA: Diagnosis not present

## 2015-03-02 DIAGNOSIS — Z01818 Encounter for other preprocedural examination: Secondary | ICD-10-CM

## 2015-03-02 MED ORDER — CARVEDILOL 6.25 MG PO TABS: 6.2500 mg | ORAL_TABLET | Freq: Two times a day (BID) | ORAL | Status: AC

## 2015-03-02 NOTE — Progress Notes (Signed)
Cardiology Office Note  Date: 03/02/2015   ID: Joanna Farmer, DOB 12/01/1955, MRN [id number]  PCP: Rosita Fire, MD  Primary Cardiologist: Rozann Lesches, MD   Chief Complaint  Patient presents with  . Hospitalization Follow-up  . Cardiomyopathy    History of Present Illness: Joanna Farmer is a 59 y.o. female seen by me for the first time in September with no records regarding her prior cardiac history. She was hospitalized recently in October with chest discomfort and shortness of breath. She was diagnosed with pneumonia, troponin I levels were not consistent with ACS. She was evaluated in consultation by Dr. Bronson Ing - I reviewed the notes.  Unfortunately, we have not been a to obtain any records from her prior reported cardiac evaluation in [place]. He has evidence of suspected ischemic cardiomyopathy based on recent echocardiography, and her medications have been adjusted. She recalls having had 2 stents placed in 2015, although the details are not clear.  She does report dyspnea on exertion and intermittent chest tightness with activities such as carrying in groceries or going up steps. This is not new, but she has noticed it fairly regularly over the last several months.  We reviewed her medications, she reports compliance. We also discussed her echocardiographic findings, concern for ischemic cardiomyopathy, and also rationale for a cardiac catheterization to better evaluate her coronary anatomy and understand if any revascularization strategies need to be considered. At this time she is in agreement to proceed, although this will have to be coordinated with a transportation service since she does not drive, and her niece who will be coming with her for the procedure, also does not drive.  Her history does include polysubstance abuse including cocaine, although at this time she states that she has been clean (UDS negative in October). She does drink alcohol, but reportedly  not to excess. Her cardiomyopathy could be due to these issues as well.   Past Medical History  Diagnosis Date  . Essential hypertension   . Depression   . Hyperlipidemia   . Reflux   . Alcohol abuse   . History of cocaine use   . CAD (coronary artery disease)     Reportedly 2 stents placed [name], [name]  . Herpes simplex virus (HSV) infection 2016    Antibodies    Past Surgical History  Procedure Laterality Date  . Tubal ligation      Current Outpatient Prescriptions  Medication Sig Dispense Refill  . aspirin 81 MG tablet Take 81 mg by mouth daily.    . clopidogrel (PLAVIX) 75 MG tablet Take 1 tablet (75 mg total) by mouth daily. 30 tablet 0  . hydrochlorothiazide (HYDRODIURIL) 12.5 MG tablet Take 1 tablet (12.5 mg total) by mouth daily. 30 tablet 0  . losartan (COZAAR) 50 MG tablet Take 1 tablet (50 mg total) by mouth daily. 90 tablet 3  . carvedilol (COREG) 6.25 MG tablet Take 1 tablet (6.25 mg total) by mouth 2 (two) times daily. 180 tablet 3   No current facility-administered medications for this visit.    Allergies:  Review of patient's allergies indicates no known allergies.   Social History: The patient  reports that she has been smoking Cigarettes.  She has been smoking about 0.00 packs per day for the past 32 years. She has never used smokeless tobacco. She reports that she drinks alcohol. She reports that she does not use illicit drugs.   ROS:  Please see the history of present illness. Otherwise, complete  review of systems is positive for weight loss.  All other systems are reviewed and negative.   Physical Exam: VS:  BP 116/64 mmHg  Pulse 89  Ht 4\' 11"  (1.499 m)  Wt 106 lb 9.6 oz (48.353 kg)  BMI 21.52 kg/m2  SpO2 97%, BMI Body mass index is 21.52 kg/(m^2).  Wt Readings from Last 3 Encounters:  03/02/15 106 lb 9.6 oz (48.353 kg)  02/01/15 108 lb 9.6 oz (49.261 kg)  01/12/15 109 lb 6.4 oz (49.624 kg)     General: Appears comfortable at  rest. HEENT: Conjunctiva and lids normal, oropharynx clear. Neck: Supple, no elevated JVP or carotid bruits, no thyromegaly. Lungs: Clear to auscultation, nonlabored breathing at rest. Cardiac: Indistinct PMI, regular rate and rhythm, no S3, soft apical systolic murmur, no pericardial rub. Abdomen: Soft, nontender, bowel sounds present, no guarding or rebound. Extremities: No pitting edema, distal pulses 2+. Skin: Warm and dry. Musculoskeletal: No kyphosis. Neuropsychiatric: Alert and oriented x3, affect grossly appropriate.   ECG: Tracing from 02/01/2015 showed sinus rhythm with left bundle-branch block (old).  Recent Labwork: 11/25/2014: B Natriuretic Peptide 202.0* 02/01/2015: ALT 16; AST 30; Magnesium 1.7 02/03/2015: Hemoglobin 14.3; Platelets 236 02/04/2015: BUN 11; Creat 0.80; Potassium 3.9; Sodium 138   Other Studies Reviewed Today:  Echocardiogram 02/02/2015: Study Conclusions  - Left ventricle: The cavity size was mildly dilated. Wall thickness was normal. Systolic function was severely reduced. The estimated ejection fraction was approximately 20%. Doppler parameters are consistent with abnormal left ventricular relaxation (grade 1 diastolic dysfunction). Severe hypokinesis of the entire anterior myocardium; moderate hypokinesis of the basal-mid anteroseptal, mid inferolateral, and mid anterolateral myocardium; mild hypokinesis of the mid inferoseptal and basal inferolateral myocardium. - Aortic valve: Mildly calcified annulus. Trileaflet. - Mitral valve: Mildly thickened leaflets . There was mild regurgitation. - Left atrium: The atrium was mildly dilated.  ASSESSMENT AND PLAN:  1. Possible ischemic cardiomyopathy with LVEF approximately 20% and wall motion abnormalities as detailed in the recent echocardiogram from October. She reports a history of CAD and previous stent placement in [place], although the details are not at all clear, and we have  not been able to obtain any records as yet. The office is still working on this. She does report symptoms of exertional chest discomfort and shortness of breath. Plan at this time is to increase Coreg to 6.25 mg twice daily, schedule an outpatient left and right heart catheterization to clearly outline her coronary anatomy and hemodynamics, and help guide the next step. Procedure will before performed after Thanksgiving at her request, will also need to be coordinated with a transportation service.  2. History of substance abuse including cocaine, although reportedly clean at this time. Recent UDS was negative. Her cardiomyopathy could also be related to substance abuse.  3. Essential hypertension, blood pressure is normal today.  4. Reported history of hyperlipidemia, although LDL was 44 as of September. She is not on statin therapy at this time.  Current medicines were reviewed at length with the patient today.   Orders Placed This Encounter  Procedures  . CBC with Differential  . Basic Metabolic Panel (BMET)  . INR/PT    Disposition: FU with me after the procedure.   Signed, Satira Sark, MD, [facility] 03/02/2015 1:37 PM    [facility] at [facility] [address], [place], [place] [address] Phone: [phone]; Fax: [phone]

## 2015-03-02 NOTE — Patient Instructions (Signed)
Medication Instructions:  INCREASE COREG TO 6.25 TWO TIMES DAILY  Labwork: GET LAB WORK DONE THE MORNING OF YOUR PROCEDURE  Testing/Procedures: Your physician has requested that you have a cardiac catheterization. Cardiac catheterization is used to diagnose and/or treat various heart conditions. Doctors may recommend this procedure for a number of different reasons. The most common reason is to evaluate chest pain. Chest pain can be a symptom of coronary artery disease (CAD), and cardiac catheterization can show whether plaque is narrowing or blocking your heart's arteries. This procedure is also used to evaluate the valves, as well as measure the blood flow and oxygen levels in different parts of your heart. For further information please visit [email or web address]. Please follow instruction sheet, as given.    Follow-Up: TO BE DETERMINED   Any Other Special Instructions Will Be Listed Below (If Applicable).     If you need a refill on your cardiac medications before your next appointment, please call your pharmacy.  Thanks for choosing [facility]!!!

## 2015-03-16 ENCOUNTER — Other Ambulatory Visit: Payer: Self-pay

## 2015-03-16 ENCOUNTER — Encounter (HOSPITAL_COMMUNITY): Payer: Self-pay | Admitting: Interventional Cardiology

## 2015-03-16 ENCOUNTER — Encounter (HOSPITAL_COMMUNITY)
Admission: RE | Disposition: A | Discharge status: Home / Self Care | Payer: Medicaid Other | Source: Ambulatory Visit | Attending: Interventional Cardiology

## 2015-03-16 ENCOUNTER — Ambulatory Visit (HOSPITAL_COMMUNITY)
Admission: RE | Admit: 2015-03-16 | Discharge: 2015-03-17 | Disposition: A | Discharge status: Home / Self Care | Payer: Medicaid Other | Source: Ambulatory Visit | Attending: Interventional Cardiology | Admitting: Interventional Cardiology

## 2015-03-16 DIAGNOSIS — I25119 Atherosclerotic heart disease of native coronary artery with unspecified angina pectoris: Secondary | ICD-10-CM | POA: Insufficient documentation

## 2015-03-16 DIAGNOSIS — Z79899 Other long term (current) drug therapy: Secondary | ICD-10-CM | POA: Insufficient documentation

## 2015-03-16 DIAGNOSIS — K219 Gastro-esophageal reflux disease without esophagitis: Secondary | ICD-10-CM | POA: Diagnosis not present

## 2015-03-16 DIAGNOSIS — E785 Hyperlipidemia, unspecified: Secondary | ICD-10-CM | POA: Diagnosis not present

## 2015-03-16 DIAGNOSIS — Z7982 Long term (current) use of aspirin: Secondary | ICD-10-CM | POA: Insufficient documentation

## 2015-03-16 DIAGNOSIS — I10 Essential (primary) hypertension: Secondary | ICD-10-CM | POA: Diagnosis not present

## 2015-03-16 DIAGNOSIS — Z9861 Coronary angioplasty status: Secondary | ICD-10-CM

## 2015-03-16 DIAGNOSIS — I2510 Atherosclerotic heart disease of native coronary artery without angina pectoris: Secondary | ICD-10-CM | POA: Diagnosis not present

## 2015-03-16 DIAGNOSIS — I209 Angina pectoris, unspecified: Secondary | ICD-10-CM | POA: Diagnosis present

## 2015-03-16 DIAGNOSIS — F17210 Nicotine dependence, cigarettes, uncomplicated: Secondary | ICD-10-CM | POA: Diagnosis not present

## 2015-03-16 DIAGNOSIS — F329 Major depressive disorder, single episode, unspecified: Secondary | ICD-10-CM | POA: Insufficient documentation

## 2015-03-16 DIAGNOSIS — Z955 Presence of coronary angioplasty implant and graft: Secondary | ICD-10-CM

## 2015-03-16 DIAGNOSIS — I255 Ischemic cardiomyopathy: Secondary | ICD-10-CM | POA: Diagnosis not present

## 2015-03-16 HISTORY — DX: Gastro-esophageal reflux disease without esophagitis: K21.9

## 2015-03-16 HISTORY — DX: Coronary angioplasty status: Z98.61

## 2015-03-16 HISTORY — DX: Pneumonia, unspecified organism: J18.9

## 2015-03-16 HISTORY — DX: Hyperlipidemia, unspecified: E78.5

## 2015-03-16 HISTORY — DX: Atherosclerotic heart disease of native coronary artery without angina pectoris: I25.10

## 2015-03-16 HISTORY — DX: Herpesviral infection, unspecified: B00.9

## 2015-03-16 HISTORY — DX: Ischemic cardiomyopathy: I25.5

## 2015-03-16 HISTORY — DX: Old myocardial infarction: I25.2

## 2015-03-16 HISTORY — PX: CARDIAC CATHETERIZATION: SHX172

## 2015-03-16 LAB — POCT I-STAT 3, VENOUS BLOOD GAS (G3P V)
ACID-BASE DEFICIT: 4 mmol/L — AB (ref 0.0–2.0)
Acid-base deficit: 1 mmol/L (ref 0.0–2.0)
BICARBONATE: 22.1 meq/L (ref 20.0–24.0)
BICARBONATE: 24.3 meq/L — AB (ref 20.0–24.0)
O2 SAT: 72 %
O2 SAT: 77 %
PCO2 VEN: 43.3 mmHg — AB (ref 45.0–50.0)
PO2 VEN: 41 mmHg (ref 30.0–45.0)
PO2 VEN: 44 mmHg (ref 30.0–45.0)
TCO2: 23 mmol/L (ref 0–100)
TCO2: 26 mmol/L (ref 0–100)
pCO2, Ven: 43.6 mmHg — ABNORMAL LOW (ref 45.0–50.0)
pH, Ven: 7.313 — ABNORMAL HIGH (ref 7.250–7.300)
pH, Ven: 7.357 — ABNORMAL HIGH (ref 7.250–7.300)

## 2015-03-16 LAB — CBC
HEMATOCRIT: 38.1 % (ref 36.0–46.0)
HEMOGLOBIN: 13.2 g/dL (ref 12.0–15.0)
MCH: 31.7 pg (ref 26.0–34.0)
MCHC: 34.6 g/dL (ref 30.0–36.0)
MCV: 91.4 fL (ref 78.0–100.0)
Platelets: 191 10*3/uL (ref 150–400)
RBC: 4.17 MIL/uL (ref 3.87–5.11)
RDW: 14.8 % (ref 11.5–15.5)
WBC: 3.2 10*3/uL — ABNORMAL LOW (ref 4.0–10.5)

## 2015-03-16 LAB — POCT I-STAT 3, ART BLOOD GAS (G3+)
Acid-base deficit: 5 mmol/L — ABNORMAL HIGH (ref 0.0–2.0)
BICARBONATE: 21 meq/L (ref 20.0–24.0)
O2 SAT: 97 %
PH ART: 7.33 — AB (ref 7.350–7.450)
TCO2: 22 mmol/L (ref 0–100)
pCO2 arterial: 39.8 mmHg (ref 35.0–45.0)
pO2, Arterial: 98 mmHg (ref 80.0–100.0)

## 2015-03-16 LAB — BASIC METABOLIC PANEL
ANION GAP: 10 (ref 5–15)
BUN: 22 mg/dL — ABNORMAL HIGH (ref 6–20)
CO2: 22 mmol/L (ref 22–32)
Calcium: 9.2 mg/dL (ref 8.9–10.3)
Chloride: 101 mmol/L (ref 101–111)
Creatinine, Ser: 0.91 mg/dL (ref 0.44–1.00)
GFR calc Af Amer: >60 mL/min (ref >60)
GLUCOSE: 91 mg/dL (ref 65–99)
POTASSIUM: 3.5 mmol/L (ref 3.5–5.1)
Sodium: 133 mmol/L — ABNORMAL LOW (ref 135–145)

## 2015-03-16 LAB — POCT ACTIVATED CLOTTING TIME
Activated Clotting Time: 331 seconds
Activated Clotting Time: 411 seconds

## 2015-03-16 LAB — PROTIME-INR
INR: 0.97 (ref 0.00–1.49)
Prothrombin Time: 13.1 seconds (ref 11.6–15.2)

## 2015-03-16 SURGERY — RIGHT/LEFT HEART CATH AND CORONARY ANGIOGRAPHY
Anesthesia: LOCAL | Laterality: Right

## 2015-03-16 MED ORDER — HEPARIN (PORCINE) IN NACL 2-0.9 UNIT/ML-% IJ SOLN
INTRAMUSCULAR | Status: AC
  Filled 2015-03-16: qty 1500

## 2015-03-16 MED ORDER — MIDAZOLAM HCL 2 MG/2ML IJ SOLN
INTRAMUSCULAR | Status: AC
  Filled 2015-03-16: qty 2

## 2015-03-16 MED ORDER — FENTANYL CITRATE (PF) 100 MCG/2ML IJ SOLN
INTRAMUSCULAR | Status: DC | PRN
  Administered 2015-03-16: 25 ug via INTRAVENOUS

## 2015-03-16 MED ORDER — IOHEXOL 350 MG/ML SOLN
INTRAVENOUS | Status: DC | PRN
  Administered 2015-03-16: 150 mL via INTRACARDIAC

## 2015-03-16 MED ORDER — NITROGLYCERIN 1 MG/10 ML FOR IR/CATH LAB
INTRA_ARTERIAL | Status: AC
  Filled 2015-03-16: qty 10

## 2015-03-16 MED ORDER — CARVEDILOL 3.125 MG PO TABS
6.2500 mg | ORAL_TABLET | Freq: Two times a day (BID) | ORAL | Status: DC
  Administered 2015-03-16 – 2015-03-17 (×2): 6.25 mg via ORAL
  Filled 2015-03-16 (×2): qty 2

## 2015-03-16 MED ORDER — HEPARIN SODIUM (PORCINE) 1000 UNIT/ML IJ SOLN
INTRAMUSCULAR | Status: DC | PRN
  Administered 2015-03-16: 3000 [IU] via INTRAVENOUS
  Administered 2015-03-16: 4000 [IU] via INTRAVENOUS

## 2015-03-16 MED ORDER — ASPIRIN 81 MG PO CHEW: 81.0000 mg | CHEWABLE_TABLET | ORAL | Status: DC

## 2015-03-16 MED ORDER — ASPIRIN EC 81 MG PO TBEC: 81.0000 mg | DELAYED_RELEASE_TABLET | Freq: Every day | ORAL | Status: DC

## 2015-03-16 MED ORDER — CLOPIDOGREL BISULFATE 300 MG PO TABS
ORAL_TABLET | ORAL | Status: AC
  Filled 2015-03-16: qty 2

## 2015-03-16 MED ORDER — VERAPAMIL HCL 2.5 MG/ML IV SOLN
INTRAVENOUS | Status: DC | PRN
  Administered 2015-03-16: 10 mL via INTRA_ARTERIAL

## 2015-03-16 MED ORDER — SODIUM CHLORIDE 0.9 % IV SOLN
INTRAVENOUS | Status: DC
  Administered 2015-03-16: 1000 mL via INTRAVENOUS

## 2015-03-16 MED ORDER — CLOPIDOGREL BISULFATE 75 MG PO TABS: 75.0000 mg | ORAL_TABLET | Freq: Every day | ORAL | Status: DC

## 2015-03-16 MED ORDER — VERAPAMIL HCL 2.5 MG/ML IV SOLN
INTRAVENOUS | Status: AC
  Filled 2015-03-16: qty 2

## 2015-03-16 MED ORDER — ACETAMINOPHEN 325 MG PO TABS
650.0000 mg | ORAL_TABLET | ORAL | Status: DC | PRN
  Administered 2015-03-17: 650 mg via ORAL
  Filled 2015-03-16: qty 2

## 2015-03-16 MED ORDER — ONDANSETRON HCL 4 MG/2ML IJ SOLN: 4.0000 mg | Freq: Four times a day (QID) | INTRAMUSCULAR | Status: DC | PRN

## 2015-03-16 MED ORDER — HEPARIN SODIUM (PORCINE) 1000 UNIT/ML IJ SOLN
INTRAMUSCULAR | Status: AC
  Filled 2015-03-16: qty 1

## 2015-03-16 MED ORDER — SODIUM CHLORIDE 0.9 % IV SOLN: 250.0000 mL | INTRAVENOUS | Status: DC | PRN

## 2015-03-16 MED ORDER — FENTANYL CITRATE (PF) 100 MCG/2ML IJ SOLN
INTRAMUSCULAR | Status: AC
  Filled 2015-03-16: qty 2

## 2015-03-16 MED ORDER — CLOPIDOGREL BISULFATE 300 MG PO TABS
ORAL_TABLET | ORAL | Status: DC | PRN
  Administered 2015-03-16: 600 mg via ORAL

## 2015-03-16 MED ORDER — NITROGLYCERIN 1 MG/10 ML FOR IR/CATH LAB
INTRA_ARTERIAL | Status: DC | PRN
  Administered 2015-03-16: 200 ug via INTRA_ARTERIAL
  Administered 2015-03-16 (×2): 200 ug via INTRACORONARY

## 2015-03-16 MED ORDER — MIDAZOLAM HCL 2 MG/2ML IJ SOLN
INTRAMUSCULAR | Status: DC | PRN
  Administered 2015-03-16 (×3): 1 mg via INTRAVENOUS

## 2015-03-16 MED ORDER — SODIUM CHLORIDE 0.9 % IJ SOLN: 3.0000 mL | INTRAMUSCULAR | Status: DC | PRN

## 2015-03-16 MED ORDER — CLOPIDOGREL BISULFATE 75 MG PO TABS
75.0000 mg | ORAL_TABLET | Freq: Every day | ORAL | Status: DC
Start: 2015-03-17 — End: 2015-03-17
  Administered 2015-03-17: 75 mg via ORAL
  Filled 2015-03-16: qty 1

## 2015-03-16 MED ORDER — LIDOCAINE HCL (PF) 1 % IJ SOLN
INTRAMUSCULAR | Status: DC | PRN
  Administered 2015-03-16: 3 mL via INTRADERMAL
  Administered 2015-03-16: 2 mL via INTRADERMAL

## 2015-03-16 MED ORDER — SODIUM CHLORIDE 0.9 % WEIGHT BASED INFUSION: 1.0000 mL/kg/h | INTRAVENOUS | Status: AC

## 2015-03-16 MED ORDER — ASPIRIN EC 81 MG PO TBEC
81.0000 mg | DELAYED_RELEASE_TABLET | Freq: Every day | ORAL | Status: DC
  Administered 2015-03-17: 81 mg via ORAL
  Filled 2015-03-16: qty 1

## 2015-03-16 MED ORDER — SODIUM CHLORIDE 0.9 % IJ SOLN
3.0000 mL | Freq: Two times a day (BID) | INTRAMUSCULAR | Status: DC
  Administered 2015-03-16 (×2): 3 mL via INTRAVENOUS

## 2015-03-16 MED ORDER — SODIUM CHLORIDE 0.9 % IJ SOLN: 3.0000 mL | Freq: Two times a day (BID) | INTRAMUSCULAR | Status: DC

## 2015-03-16 MED ORDER — LOSARTAN POTASSIUM 50 MG PO TABS
50.0000 mg | ORAL_TABLET | Freq: Every day | ORAL | Status: DC
  Administered 2015-03-17: 09:00:00 50 mg via ORAL
  Filled 2015-03-16 (×2): qty 1

## 2015-03-16 MED ORDER — HEPARIN SODIUM (PORCINE) 5000 UNIT/ML IJ SOLN: 5000.0000 [IU] | Freq: Three times a day (TID) | INTRAMUSCULAR | Status: DC

## 2015-03-16 MED ORDER — LIDOCAINE HCL (PF) 1 % IJ SOLN
INTRAMUSCULAR | Status: AC
  Filled 2015-03-16: qty 30

## 2015-03-16 MED ORDER — HYDROCHLOROTHIAZIDE 25 MG PO TABS
12.5000 mg | ORAL_TABLET | Freq: Every day | ORAL | Status: DC
  Administered 2015-03-17: 09:00:00 12.5 mg via ORAL
  Filled 2015-03-16 (×2): qty 1

## 2015-03-16 SURGICAL SUPPLY — 26 items
BALLN EUPHORA RX 2.0X20 (BALLOONS) ×3
BALLN ~~LOC~~ EMERGE MR 3.25X20 (BALLOONS) ×3
BALLOON EUPHORA RX 2.0X20 (BALLOONS) ×2 IMPLANT
BALLOON ~~LOC~~ EMERGE MR 3.25X20 (BALLOONS) ×2 IMPLANT
CATH BALLN WEDGE 5F 110CM (CATHETERS) ×3 IMPLANT
CATH INFINITI 5 FR JL3.5 (CATHETERS) ×3 IMPLANT
CATH INFINITI 5FR ANG PIGTAIL (CATHETERS) ×3 IMPLANT
CATH INFINITI JR4 5F (CATHETERS) ×3 IMPLANT
DEVICE RAD COMP TR BAND LRG (VASCULAR PRODUCTS) ×3 IMPLANT
GLIDESHEATH SLEND SS 6F .021 (SHEATH) ×3 IMPLANT
GUIDE CATH RUNWAY 6FR FR4 (CATHETERS) ×3 IMPLANT
KIT ENCORE 26 ADVANTAGE (KITS) ×3 IMPLANT
KIT HEART LEFT (KITS) ×3 IMPLANT
KIT HEART RIGHT NAMIC (KITS) ×3 IMPLANT
PACK CARDIAC CATHETERIZATION (CUSTOM PROCEDURE TRAY) ×3 IMPLANT
SHEATH FAST CATH BRACH 5F 5CM (SHEATH) ×3 IMPLANT
STENT SYNERGY DES 2.5X38 (Permanent Stent) ×3 IMPLANT
STENT SYNERGY DES 2.75X32 (Permanent Stent) ×3 IMPLANT
SYR MEDRAD MARK V 150ML (SYRINGE) ×3 IMPLANT
TRANSDUCER W/STOPCOCK (MISCELLANEOUS) ×3 IMPLANT
TUBING CIL FLEX 10 FLL-RA (TUBING) ×3 IMPLANT
VALVE GUARDIAN II ~~LOC~~ HEMO (MISCELLANEOUS) ×3 IMPLANT
WIRE ASAHI PROWATER 180CM (WIRE) ×3 IMPLANT
WIRE EMERALD 3MM-J .025X260CM (WIRE) ×3 IMPLANT
WIRE HI TORQ VERSACORE-J 145CM (WIRE) ×3 IMPLANT
WIRE SAFE-T 1.5MM-J .035X260CM (WIRE) ×3 IMPLANT

## 2015-03-16 NOTE — Progress Notes (Signed)
Site area: right brachial( venous sheath)  Site Prior to Removal:  Level 0  Pressure Applied For 10 MINUTES    Minutes Beginning at 1220   Manual:   Yes.    Patient Status During Pull:  AAO x 4  Post Pull brachial  Site:  Level 0  Post Pull Instructions Given:  Yes.    Post Pull Pulses Present:  Yes.    Dressing Applied:  Yes.    Comments:  Tolerated procedure well

## 2015-03-16 NOTE — H&P (View-Only) (Signed)
Cardiology Office Note  Date: 03/02/2015   ID: Joanna Farmer, DOB 1955-07-06, MRN [id number]  PCP: Rosita Fire, MD  Primary Cardiologist: Rozann Lesches, MD   Chief Complaint  Patient presents with  . Hospitalization Follow-up  . Cardiomyopathy    History of Present Illness: Joanna Farmer is a 59 y.o. female seen by me for the first time in September with no records regarding her prior cardiac history. She was hospitalized recently in October with chest discomfort and shortness of breath. She was diagnosed with pneumonia, troponin I levels were not consistent with ACS. She was evaluated in consultation by Dr. Bronson Ing - I reviewed the notes.  Unfortunately, we have not been a to obtain any records from her prior reported cardiac evaluation in [place]. He has evidence of suspected ischemic cardiomyopathy based on recent echocardiography, and her medications have been adjusted. She recalls having had 2 stents placed in 2015, although the details are not clear.  She does report dyspnea on exertion and intermittent chest tightness with activities such as carrying in groceries or going up steps. This is not new, but she has noticed it fairly regularly over the last several months.  We reviewed her medications, she reports compliance. We also discussed her echocardiographic findings, concern for ischemic cardiomyopathy, and also rationale for a cardiac catheterization to better evaluate her coronary anatomy and understand if any revascularization strategies need to be considered. At this time she is in agreement to proceed, although this will have to be coordinated with a transportation service since she does not drive, and her niece who will be coming with her for the procedure, also does not drive.  Her history does include polysubstance abuse including cocaine, although at this time she states that she has been clean (UDS negative in October). She does drink alcohol, but reportedly  not to excess. Her cardiomyopathy could be due to these issues as well.   Past Medical History  Diagnosis Date  . Essential hypertension   . Depression   . Hyperlipidemia   . Reflux   . Alcohol abuse   . History of cocaine use   . CAD (coronary artery disease)     Reportedly 2 stents placed [place], [name]  . Herpes simplex virus (HSV) infection 2016    Antibodies    Past Surgical History  Procedure Laterality Date  . Tubal ligation      Current Outpatient Prescriptions  Medication Sig Dispense Refill  . aspirin 81 MG tablet Take 81 mg by mouth daily.    . clopidogrel (PLAVIX) 75 MG tablet Take 1 tablet (75 mg total) by mouth daily. 30 tablet 0  . hydrochlorothiazide (HYDRODIURIL) 12.5 MG tablet Take 1 tablet (12.5 mg total) by mouth daily. 30 tablet 0  . losartan (COZAAR) 50 MG tablet Take 1 tablet (50 mg total) by mouth daily. 90 tablet 3  . carvedilol (COREG) 6.25 MG tablet Take 1 tablet (6.25 mg total) by mouth 2 (two) times daily. 180 tablet 3   No current facility-administered medications for this visit.    Allergies:  Review of patient's allergies indicates no known allergies.   Social History: The patient  reports that she has been smoking Cigarettes.  She has been smoking about 0.00 packs per day for the past 32 years. She has never used smokeless tobacco. She reports that she drinks alcohol. She reports that she does not use illicit drugs.   ROS:  Please see the history of present illness. Otherwise, complete  review of systems is positive for weight loss.  All other systems are reviewed and negative.   Physical Exam: VS:  BP 116/64 mmHg  Pulse 89  Ht 4\' 11"  (1.499 m)  Wt 106 lb 9.6 oz (48.353 kg)  BMI 21.52 kg/m2  SpO2 97%, BMI Body mass index is 21.52 kg/(m^2).  Wt Readings from Last 3 Encounters:  03/02/15 106 lb 9.6 oz (48.353 kg)  02/01/15 108 lb 9.6 oz (49.261 kg)  01/12/15 109 lb 6.4 oz (49.624 kg)     General: Appears comfortable at  rest. HEENT: Conjunctiva and lids normal, oropharynx clear. Neck: Supple, no elevated JVP or carotid bruits, no thyromegaly. Lungs: Clear to auscultation, nonlabored breathing at rest. Cardiac: Indistinct PMI, regular rate and rhythm, no S3, soft apical systolic murmur, no pericardial rub. Abdomen: Soft, nontender, bowel sounds present, no guarding or rebound. Extremities: No pitting edema, distal pulses 2+. Skin: Warm and dry. Musculoskeletal: No kyphosis. Neuropsychiatric: Alert and oriented x3, affect grossly appropriate.   ECG: Tracing from 02/01/2015 showed sinus rhythm with left bundle-branch block (old).  Recent Labwork: 11/25/2014: B Natriuretic Peptide 202.0* 02/01/2015: ALT 16; AST 30; Magnesium 1.7 02/03/2015: Hemoglobin 14.3; Platelets 236 02/04/2015: BUN 11; Creat 0.80; Potassium 3.9; Sodium 138   Other Studies Reviewed Today:  Echocardiogram 02/02/2015: Study Conclusions  - Left ventricle: The cavity size was mildly dilated. Wall thickness was normal. Systolic function was severely reduced. The estimated ejection fraction was approximately 20%. Doppler parameters are consistent with abnormal left ventricular relaxation (grade 1 diastolic dysfunction). Severe hypokinesis of the entire anterior myocardium; moderate hypokinesis of the basal-mid anteroseptal, mid inferolateral, and mid anterolateral myocardium; mild hypokinesis of the mid inferoseptal and basal inferolateral myocardium. - Aortic valve: Mildly calcified annulus. Trileaflet. - Mitral valve: Mildly thickened leaflets . There was mild regurgitation. - Left atrium: The atrium was mildly dilated.  ASSESSMENT AND PLAN:  1. Possible ischemic cardiomyopathy with LVEF approximately 20% and wall motion abnormalities as detailed in the recent echocardiogram from October. She reports a history of CAD and previous stent placement in [place], although the details are not at all clear, and we have  not been able to obtain any records as yet. The office is still working on this. She does report symptoms of exertional chest discomfort and shortness of breath. Plan at this time is to increase Coreg to 6.25 mg twice daily, schedule an outpatient left and right heart catheterization to clearly outline her coronary anatomy and hemodynamics, and help guide the next step. Procedure will before performed after Thanksgiving at her request, will also need to be coordinated with a transportation service.  2. History of substance abuse including cocaine, although reportedly clean at this time. Recent UDS was negative. Her cardiomyopathy could also be related to substance abuse.  3. Essential hypertension, blood pressure is normal today.  4. Reported history of hyperlipidemia, although LDL was 44 as of September. She is not on statin therapy at this time.  Current medicines were reviewed at length with the patient today.   Orders Placed This Encounter  Procedures  . CBC with Differential  . Basic Metabolic Panel (BMET)  . INR/PT    Disposition: FU with me after the procedure.   Signed, Satira Sark, MD, [facility] 03/02/2015 1:37 PM    [facility] [address] [place], [place] [address] Phone: [phone]; Fax: [phone]

## 2015-03-16 NOTE — Progress Notes (Signed)
TR BAND REMOVAL  LOCATION:    right radial  DEFLATED PER PROTOCOL:    Yes.    TIME BAND OFF / DRESSING APPLIED:    1430   SITE UPON ARRIVAL:    Level 0  SITE AFTER BAND REMOVAL:    Level 0  CIRCULATION SENSATION AND MOVEMENT:    Within Normal Limits   Yes.    COMMENTS:   Tolerated procedure well

## 2015-03-16 NOTE — Interval H&P Note (Signed)
Cath Lab Visit (complete for each Cath Lab visit)  Clinical Evaluation Leading to the Procedure:   ACS: No.  Non-ACS:    Anginal Classification: CCS III  Anti-ischemic medical therapy: Minimal Therapy (1 class of medications)  Non-Invasive Test Results: High-risk stress test findings: cardiac mortality >3%/year  Prior CABG: No previous CABG   Known cardiomyopathy.     History and Physical Interval Note:  03/16/2015 8:19 AM  Joanna Farmer  has presented today for surgery, with the diagnosis of cp  The various methods of treatment have been discussed with the patient and family. After consideration of risks, benefits and other options for treatment, the patient has consented to  Procedure(s): Right/Left Heart Cath and Coronary Angiography (N/A) as a surgical intervention .  The patient's history has been reviewed, patient examined, no change in status, stable for surgery.  I have reviewed the patient's chart and labs.  Questions were answered to the patient's satisfaction.     Fenton Candee S.

## 2015-03-17 ENCOUNTER — Other Ambulatory Visit: Payer: Self-pay

## 2015-03-17 ENCOUNTER — Encounter (HOSPITAL_COMMUNITY): Payer: Self-pay | Admitting: Cardiology

## 2015-03-17 DIAGNOSIS — Z9861 Coronary angioplasty status: Secondary | ICD-10-CM

## 2015-03-17 DIAGNOSIS — Z955 Presence of coronary angioplasty implant and graft: Secondary | ICD-10-CM

## 2015-03-17 DIAGNOSIS — I10 Essential (primary) hypertension: Secondary | ICD-10-CM

## 2015-03-17 DIAGNOSIS — I209 Angina pectoris, unspecified: Secondary | ICD-10-CM | POA: Diagnosis not present

## 2015-03-17 DIAGNOSIS — I255 Ischemic cardiomyopathy: Secondary | ICD-10-CM | POA: Diagnosis not present

## 2015-03-17 DIAGNOSIS — I25119 Atherosclerotic heart disease of native coronary artery with unspecified angina pectoris: Secondary | ICD-10-CM | POA: Diagnosis not present

## 2015-03-17 DIAGNOSIS — I2510 Atherosclerotic heart disease of native coronary artery without angina pectoris: Secondary | ICD-10-CM | POA: Diagnosis not present

## 2015-03-17 DIAGNOSIS — E785 Hyperlipidemia, unspecified: Secondary | ICD-10-CM

## 2015-03-17 HISTORY — DX: Atherosclerotic heart disease of native coronary artery without angina pectoris: I25.10

## 2015-03-17 LAB — BASIC METABOLIC PANEL
ANION GAP: 10 (ref 5–15)
BUN: 18 mg/dL (ref 6–20)
CALCIUM: 9 mg/dL (ref 8.9–10.3)
CO2: 21 mmol/L — ABNORMAL LOW (ref 22–32)
Chloride: 105 mmol/L (ref 101–111)
Creatinine, Ser: 0.75 mg/dL (ref 0.44–1.00)
GFR calc Af Amer: >60 mL/min (ref >60)
GLUCOSE: 99 mg/dL (ref 65–99)
Potassium: 3.6 mmol/L (ref 3.5–5.1)
SODIUM: 136 mmol/L (ref 135–145)

## 2015-03-17 LAB — CBC
HCT: 33.2 % — ABNORMAL LOW (ref 36.0–46.0)
Hemoglobin: 11.1 g/dL — ABNORMAL LOW (ref 12.0–15.0)
MCH: 31.2 pg (ref 26.0–34.0)
MCHC: 33.4 g/dL (ref 30.0–36.0)
MCV: 93.3 fL (ref 78.0–100.0)
Platelets: 147 10*3/uL — ABNORMAL LOW (ref 150–400)
RBC: 3.56 MIL/uL — ABNORMAL LOW (ref 3.87–5.11)
RDW: 14.9 % (ref 11.5–15.5)
WBC: 3.7 10*3/uL — AB (ref 4.0–10.5)

## 2015-03-17 MED ORDER — CLOPIDOGREL BISULFATE 75 MG PO TABS: 75.0000 mg | ORAL_TABLET | Freq: Every day | ORAL | Status: DC

## 2015-03-17 MED ORDER — ANGIOPLASTY BOOK
Freq: Once | Status: AC
  Administered 2015-03-17: 10:00:00
  Filled 2015-03-17: qty 1

## 2015-03-17 MED ORDER — ATORVASTATIN CALCIUM 80 MG PO TABS: 80.0000 mg | ORAL_TABLET | Freq: Every day | ORAL | Status: DC

## 2015-03-17 MED ORDER — NITROGLYCERIN 0.4 MG SL SUBL: 0.4000 mg | SUBLINGUAL_TABLET | SUBLINGUAL | Status: DC | PRN

## 2015-03-17 MED ORDER — LIVING BETTER WITH HEART FAILURE BOOK
Freq: Once | Status: AC
Start: 2015-03-17 — End: 2015-03-17
  Administered 2015-03-17: 09:00:00

## 2015-03-17 MED FILL — Heparin Sodium (Porcine) 2 Unit/ML in Sodium Chloride 0.9%: INTRAMUSCULAR | Qty: 1000 | Status: AC

## 2015-03-17 NOTE — Progress Notes (Signed)
CARDIAC REHAB PHASE I   PRE:  Rate/Rhythm: 85 SR  BP:  Supine: 133/68  Sitting:   Standing:    SaO2:   MODE:  Ambulation: 800 ft   POST:  Rate/Rhythm: 86 SR  BP:  Supine:   Sitting: 158/54  Standing:    SaO2:  0755-0910 Pt walked 800 ft with steady gait. No CP. Tolerated well. Gave pt CHF booklet due to low EF and reviewed when to call MD, importance of daily weights and 2000 mg sodium restriction. Pt stated had not had CHF ed before. Pt does not have scales and discussed with RN to notify case manager or nurse navigator for CHF. Gave pt fake cigarette and handout and discussed importance of not smoking. Encouraged heart healthy as well as low sodium diets. Gave ex ed and discussed CRP 2 in [place]. Will refer. Discussed importance of plavix with stent. Reviewed NTG use. Pt able to answer some teachback but encouraged to read materials at home to help her remember. Very receptive to ed.   Graylon Good, RN BSN  03/17/2015 9:07 AM

## 2015-03-17 NOTE — Discharge Summary (Signed)
Discharge Summary   Patient ID: Joanna Farmer,  MRN: [id number], DOB/AGE: 01/01/1956 59 y.o.  Admit date: 03/16/2015 Discharge date: 03/17/2015  Primary Care Provider: [facility] Primary Cardiologist: Dr. Domenic Polite  Discharge Diagnoses Principal Problem:   Angina pectoris [facility]) Active Problems:   Essential hypertension   Cardiomyopathy, ischemic - suggest prior Anterior & Inferio MI   Post PTCA   CAD S/P RCA (in 2015 then 03/16/2015)   Presence of drug coated stent in right coronary artery   Hyperlipidemia with target LDL less than 70   Allergies No Known Allergies  Procedures  Cardiac catheterization 03/16/2015 Conclusion     Mid RCA lesion, 90% stenosed just before the beginning of the previously placed stent. In-stent restenosis in the distal RCA  Mid RCA to Dist RCA lesion, 75%-90% stenosed. Moderate to severe disease in the proximal to mid RCA. Post intervention with overlapping drug-eluting stents, 2.5 x 38 mmSynergy and 2.75 x 88mm Synergy- postdilated to 3.3 mm, there is a 0% residual stenosis.  There is moderate left ventricular systolic dysfunction.  Patent stent in the mid LAD with only mild in-stent restenosis.  Normal right heart pressures. Normal LVEDP.  Continue dual antiplatelet therapy for at least a year and likely indefinitely. Continue aggressive medical therapy for cardiomyopathy. Her ejection fraction by ventriculogram appears better compared to what was reported by echocardiogram.  Gentle hydration. She had normal LVEDP and right heart pressures. Watch her overnight. Anticipate discharge tomorrow.      Hospital Course  The patient is a 59 year old female with PMH of HTN, HLD, history of cocaine use, history of CAD s/p 2 stents in [place] in 2015. She has been followed Dr. Domenic Polite, the last follow-up with Dr. Domenic Polite was on 03/02/2015. Per record, we were unable to obtain previous cardiac catheterization report from [place]. She  continued to report dyspnea on exertion and intermittent chest tightness with activity such as carrying groceries or going up steps. She has evidence of suspected ischemic cardiomyopathy based on recent echo on 01/18/2015 which showed EF of 25-30%. Her most recent repeat echocardiogram on 02/02/2015 showed EF 0000000, grade 1 diastolic dysfunction. Given her dyspnea on exertion, various options has been discussed with the patient, she agreed to undergo left and right heart catheterization. Her carvedilol was increased to 6.25 mg twice a day.   Patient presented to [facility] on 03/16/2015 for the planned procedure which showed 90% mid RCA stenosis, 75-90% mid to distal RCA stenosis treated with overlapping 2.5 x 38 mm energy DES and 2.75 x 32 mm Synergy DES postdilated to 3.3 mm. She has normal LVEDP and moderate LV systolic dysfunction. Post procedure, she was placed on aspirin and Plavix. She was seen on the following day on 03/17/2015, at which time she was doing well. According to Dr. Ellyn Hack, at some point, she will consider PRN Lasix as opposed to HCTZ. We can also consider spironolactone. We will defer this decision to the patient's primary cardiologist. She is deemed stable for discharge from cardiology perspective. She has been ambulating by cardiac rehabilitation without significant discomfort. Outpatient follow-up has been arranged. I have given her one year prescription Plavix. Emphasis has been placed on compliance with DAPT given new stents.     Discharge Vitals Blood pressure 133/68, pulse 79, temperature 98.3 F (36.8 C), temperature source Oral, resp. rate 16, height 4\' 11"  (1.499 m), weight 106 lb (48.081 kg), SpO2 98 %.  Filed Weights   03/16/15 0642  Weight: 106 lb (48.081 kg)  Labs  CBC  Recent Labs  03/16/15 0650 03/17/15 0349  WBC 3.2* 3.7*  HGB 13.2 11.1*  HCT 38.1 33.2*  MCV 91.4 93.3  PLT 191 [id number]*   Basic Metabolic Panel  Recent Labs  03/16/15 0650  03/17/15 0349  NA 133* 136  K 3.5 3.6  CL 101 105  CO2 22 21*  GLUCOSE 91 99  BUN 22* 18  CREATININE 0.91 0.75  CALCIUM 9.2 9.0    Disposition  Pt is being discharged home today in good condition.  Follow-up Plans & Appointments      Follow-up Information    Follow up with [facility] On 04/06/2015.   Specialty:  Cardiology   Why:  1:00pm. Cardiology followup with Kerin Ransom, Dr. McDowell's PA   Contact information:   [place]      Discharge Medications    Medication List    TAKE these medications        aspirin 81 MG tablet  Take 81 mg by mouth daily.     atorvastatin 80 MG tablet  Commonly known as:  LIPITOR  Take 1 tablet (80 mg total) by mouth daily at 6 PM.     carvedilol 6.25 MG tablet  Commonly known as:  COREG  Take 1 tablet (6.25 mg total) by mouth 2 (two) times daily.     clopidogrel 75 MG tablet  Commonly known as:  PLAVIX  Take 1 tablet (75 mg total) by mouth daily.     hydrochlorothiazide 12.5 MG tablet  Commonly known as:  HYDRODIURIL  Take 1 tablet (12.5 mg total) by mouth daily.     losartan 50 MG tablet  Commonly known as:  COZAAR  Take 1 tablet (50 mg total) by mouth daily.     nitroGLYCERIN 0.4 MG SL tablet  Commonly known as:  NITROSTAT  Place 1 tablet (0.4 mg total) under the tongue every 5 (five) minutes as needed for chest pain.         Duration of Discharge Encounter   Greater than 30 minutes including physician time.  Hilbert Corrigan PA-C Pager: [phone] 03/17/2015, 2:55 PM   I personally saw the patient on the day of discharge. I agree with the discharge summary above. Please see my final progress note for full details.  Angina was clearly related to Severe diffuse RCA disease - I personally reviewed the angiographic images & PCI result - restored brisk flow.  S/p PCI with 2 overlapping DES -- continue DAPT x minimum of 1 year (was on Plavix +  ASA), will check P2Y12 Inhibition assay; based upon extent of stents, would not stop Plavix after 1 yr   LAD stent patent   ICM & HTN - BP stable & normal RHC Pressures on current dose of Coreg & Lostartan; consider spironolactone; no acute CHF findings, but will probably need at least PRN Lasix (as opposed to HCTZ).  HLD - not on statin - will start Atorvastatin 80 mg  Expect that she will be ready for d/c home today provided that she ambulates in the hall without angina/dyspnea.  Will need f/u with Dr. Domenic Polite in [place].   Leonie Man, M.D., M.S. Interventional Cardiologist   Pager # [phone]

## 2015-03-17 NOTE — Discharge Instructions (Signed)
No driving for 24 hours. No lifting over 5 lbs for 1 week. No sexual activity for 1 week. Keep procedure site clean & dry. If you notice increased pain, swelling, bleeding or pus, call/return!  You may shower, but no soaking baths/hot tubs/pools for 1 week.  ° ° °

## 2015-03-17 NOTE — Progress Notes (Signed)
Note P2Y12 is draw during this admission and sent to [facility] as [facility] is waiting for parts to arrive and is down at this time. Lab was drawn at 8AM this morning, it is only good for 4 hours, therefore lab has sent the sample to [facility].   Hilbert Corrigan PA Pager: [phone]

## 2015-03-17 NOTE — Assessment & Plan Note (Deleted)
Echo [id number]: Systolic function was severely reduced. EF~ 20%. ~Grade 1 diastolic dysfunction. Severe HK of entire anterior myocardium; Mod HK of the basal-mid anteroseptal, mid inferolateral, and mid anterolateral myocardium; mild HK of the mid inferoseptal and basal inferolateral myocardium.

## 2015-03-17 NOTE — Progress Notes (Signed)
CM received consult regarding pt needing scale for home usage, pt with CHF. CM reached out to CHF nurse navigator and learn hospital out of scales([facility]F [facility]IN[facility] and gift shop). CM spoke with pt @ bedside and made pt aware of unavailiability of scales in hospital. Pt given affordable options in obtaining a scale and encouraged pt to be resourceful with family and friends per CM. Pt thanked CM and stated she would reach out to family in obtaining a scale. Whitman Hero RN,BSN,CM [phone]

## 2015-03-17 NOTE — Progress Notes (Signed)
59 y/o woman with prior h/o CAD-MI & Ischemic CM (reportedly @ least 1 MI - with 2 stents from prior Cardiologist in [place]) who was referred by Dr. Domenic Polite for R&LHC due to clinic visit with complaints of dyspnea on exertion and intermittent chest tightness with activities such as carrying in groceries or going up steps. This is not new, but she has noticed it fairly regularly over the last several months. Echo from 01/2015 showed EF ~20-25% with Anterior & Inferior HK.  Subjective:  Feels well this AM.  No angina or dyspnea walking to bathroom.   Has not walked in the hallway  Objective:  Vital Signs in the last 24 hours: Temp:  [97.7 F (36.5 C)-98.9 F (37.2 C)] 98.5 F (36.9 C) (12/01 0600) Pulse Rate:  [64-82] 64 (11/30 2100) Resp:  [13-18] 13 (12/01 0600) BP: (132-158)/(47-77) 132/56 mmHg (12/01 0600) SpO2:  [97 %-100 %] 99 % (12/01 0600)  Intake/Output from previous day: 11/30 0701 - 12/01 0700 In: 650 [P.O.:460; I.V.:190] Out: 1050 [Urine:1050] Intake/Output from this shift:    Physical Exam: General appearance: alert, cooperative, appears stated age, no distress and pleasant mood & affect Neck: no adenopathy, no carotid bruit and no JVD Lungs: clear to auscultation bilaterally, normal percussion bilaterally and with faint bibasilar crackles that clear with cough Heart: RRR, Normal S1 & S2, Prominent /sustained PMI (slightly lateral), Soft 1/6 HSM @ apex, No R/G Abdomen: soft, non-tender; bowel sounds normal; no masses,  no organomegaly Extremities: extremities normal, atraumatic, no cyanosis or edema Pulses: 2+ and symmetric Neurologic: Grossly normal  R radial site with mild puffy swelling proximal to access site - but brisk pulses & + [place].  Lab Results:  Recent Labs  03/16/15 0650 03/17/15 0349  WBC 3.2* 3.7*  HGB 13.2 11.1*  PLT 191 147*    Recent Labs  03/16/15 0650 03/17/15 0349  NA 133* 136  K 3.5 3.6  CL 101 105  CO2 22 21*  GLUCOSE 91  99  BUN 22* 18  CREATININE 0.91 0.75   No results for input(s): TROPONINI in the last 72 hours.  Invalid input(s): CK, MB Hepatic Function Panel No results for input(s): PROT, ALBUMIN, AST, ALT, ALKPHOS, BILITOT, BILIDIR, IBILI in the last 72 hours. No results for input(s): CHOL in the last 72 hours. No results for input(s): PROTIME in the last 72 hours.  Imaging: Imaging results have been reviewed  Cardiac Studies:  RIGHT & LEFT HEART CATHETERIZATION, COR ANGIOGRAPHY & PCI Conclusion -    Mid RCA to Dist RCA lesion, 75%-90% stenosed -- 90% stenosed just before the beginning of the previously placed stent WITH 75 % distal RCA ISR. Moderate to severe disease in the proximal to mid RCA.   Post intervention with overlapping drug-eluting stents, 2.5 x 38 mmSynergy and 2.75 x 53mm Synergy- postdilated to 3.3 mm, there is a 0% residual stenosis.  There is moderate left ventricular systolic dysfunction.  Patent stent in the mid LAD with only mild in-stent restenosis.  Normal right heart pressures. Normal LVEDP.  Continue dual antiplatelet therapy for at least a year and likely indefinitely. Continue aggressive medical therapy for cardiomyopathy. Her ejection fraction by ventriculogram appears better compared to what was reported by echocardiogram.  Gentle hydration. She had normal LVEDP and right heart pressures. Watch her overnight. Anticipate discharge tomorrow.    Assessment/Plan:  Principal Problem:   Angina pectoris ([place]) Active Problems:   CAD S/P RCA (in 2015 then 03/16/2015)  Presence of drug coated stent in right coronary artery   Essential hypertension   Cardiomyopathy, ischemic - suggest prior Anterior & Inferio MI   Hyperlipidemia with target LDL less than 70   Post PTCA  Angina was clearly related to Severe diffuse RCA disease - I personally reviewed the angiographic images & PCI result - restored brisk flow.  S/p PCI with 2 overlapping DES -- continue DAPT x  minimum of 1 year (was on Plavix + ASA), will check P2Y12 Inhibition assay; based upon extent of stents, would not stop Plavix after 1 yr   LAD stent patent   ICM & HTN - BP stable & normal RHC Pressures on current dose of Coreg & Lostartan; consider spironolactone; no acute CHF findings, but will probably need at least PRN Lasix (as opposed to HCTZ).  HLD - not on statin - will start Atorvastatin 80 mg   Expect that she will be ready for d/c home today provided that she ambulates in the hall without angina/dyspnea.  Will need f/u with Dr. Domenic Polite in [place].      [place], DAVID W 03/17/2015, 7:50 AM

## 2015-03-21 LAB — PLATELET INHIBITION P2Y12: PLATELET FUNCTION P2Y12: 10 [PRU] — AB (ref 194–418)

## 2015-03-23 ENCOUNTER — Telehealth (INDEPENDENT_AMBULATORY_CARE_PROVIDER_SITE_OTHER): Payer: Self-pay | Admitting: *Deleted

## 2015-03-23 ENCOUNTER — Encounter (INDEPENDENT_AMBULATORY_CARE_PROVIDER_SITE_OTHER): Payer: Self-pay | Admitting: *Deleted

## 2015-03-23 NOTE — Telephone Encounter (Signed)
Patient needs trilyte 

## 2015-03-23 NOTE — Telephone Encounter (Signed)
Referring MD/PCP: fanta   Procedure: tcs  Reason/Indication:  screening  Has patient had this procedure before?  no  If so, when, by whom and where?    Is there a family history of colon cancer?  no  Who?  What age when diagnosed?    Is patient diabetic?   no      Does patient have prosthetic heart valve or mechanical valve?  no  Do you have a pacemaker?  no  Has patient ever had endocarditis? no  Has patient had joint replacement within last 12 months?  no  Does patient tend to be constipated or take laxatives? no  Does patient have a history of alcohol/drug use?  no  Is patient on Coumadin, Plavix and/or Aspirin? yes  Medications: see epic  Allergies: nkda  Medication Adjustment: asa 2 days & plavix 5 days  Procedure date & time: 04/20/15 at 1030

## 2015-03-23 NOTE — Telephone Encounter (Signed)
No medication in here Lelon Frohlich

## 2015-03-23 NOTE — Telephone Encounter (Signed)
Patient's medication is listed in epic

## 2015-03-23 NOTE — Telephone Encounter (Signed)
agree

## 2015-03-23 NOTE — Telephone Encounter (Signed)
Referring MD/PCP: fanat   Procedure: tcs  Reason/Indication:  screening  Has patient had this procedure before?  no  If so, when, by whom and where?    Is there a family history of colon cancer?  no  Who?  What age when diagnosed?    Is patient diabetic?   no      Does patient have prosthetic heart valve or mechanical valve?  no  Do you have a pacemaker?  no  Has patient ever had endocarditis? no  Has patient had joint replacement within last 12 months?  no  Does patient tend to be constipated or take laxatives? no  Does patient have a history of alcohol/drug use?  no  Is patient on Coumadin, Plavix and/or Aspirin? yes  Medications: see epic  Allergies: see epic  Medication Adjustment: plavix 5 days, asa 2 days  Procedure date & time: 04/20/15 at 1030

## 2015-03-23 NOTE — Telephone Encounter (Signed)
This encounter was created in error - please disregard.

## 2015-03-29 NOTE — Telephone Encounter (Signed)
agree

## 2015-04-06 ENCOUNTER — Encounter: Payer: Medicaid Other | Admitting: Cardiology

## 2015-04-07 ENCOUNTER — Encounter (HOSPITAL_COMMUNITY): Payer: Self-pay

## 2015-04-07 ENCOUNTER — Emergency Department (HOSPITAL_COMMUNITY): Payer: Medicaid Other

## 2015-04-07 ENCOUNTER — Observation Stay (HOSPITAL_COMMUNITY)
Admission: EM | Admit: 2015-04-07 | Discharge: 2015-04-08 | Disposition: A | Discharge status: Home / Self Care | Payer: Medicaid Other | Attending: Internal Medicine | Admitting: Internal Medicine

## 2015-04-07 DIAGNOSIS — F1029 Alcohol dependence with unspecified alcohol-induced disorder: Secondary | ICD-10-CM

## 2015-04-07 DIAGNOSIS — Z9889 Other specified postprocedural states: Secondary | ICD-10-CM | POA: Insufficient documentation

## 2015-04-07 DIAGNOSIS — I255 Ischemic cardiomyopathy: Secondary | ICD-10-CM

## 2015-04-07 DIAGNOSIS — Z7982 Long term (current) use of aspirin: Secondary | ICD-10-CM | POA: Insufficient documentation

## 2015-04-07 DIAGNOSIS — Z79899 Other long term (current) drug therapy: Secondary | ICD-10-CM | POA: Diagnosis not present

## 2015-04-07 DIAGNOSIS — R079 Chest pain, unspecified: Secondary | ICD-10-CM | POA: Diagnosis not present

## 2015-04-07 DIAGNOSIS — F329 Major depressive disorder, single episode, unspecified: Secondary | ICD-10-CM | POA: Insufficient documentation

## 2015-04-07 DIAGNOSIS — I2510 Atherosclerotic heart disease of native coronary artery without angina pectoris: Secondary | ICD-10-CM | POA: Insufficient documentation

## 2015-04-07 DIAGNOSIS — F1020 Alcohol dependence, uncomplicated: Secondary | ICD-10-CM | POA: Diagnosis present

## 2015-04-07 DIAGNOSIS — Z720 Tobacco use: Secondary | ICD-10-CM

## 2015-04-07 DIAGNOSIS — E785 Hyperlipidemia, unspecified: Secondary | ICD-10-CM | POA: Diagnosis not present

## 2015-04-07 DIAGNOSIS — F17210 Nicotine dependence, cigarettes, uncomplicated: Secondary | ICD-10-CM | POA: Diagnosis not present

## 2015-04-07 DIAGNOSIS — I252 Old myocardial infarction: Secondary | ICD-10-CM | POA: Insufficient documentation

## 2015-04-07 DIAGNOSIS — I209 Angina pectoris, unspecified: Secondary | ICD-10-CM

## 2015-04-07 DIAGNOSIS — I10 Essential (primary) hypertension: Secondary | ICD-10-CM | POA: Insufficient documentation

## 2015-04-07 DIAGNOSIS — Z8619 Personal history of other infectious and parasitic diseases: Secondary | ICD-10-CM | POA: Insufficient documentation

## 2015-04-07 DIAGNOSIS — F1410 Cocaine abuse, uncomplicated: Secondary | ICD-10-CM | POA: Diagnosis not present

## 2015-04-07 DIAGNOSIS — Z8701 Personal history of pneumonia (recurrent): Secondary | ICD-10-CM | POA: Diagnosis not present

## 2015-04-07 DIAGNOSIS — K219 Gastro-esophageal reflux disease without esophagitis: Secondary | ICD-10-CM | POA: Diagnosis not present

## 2015-04-07 DIAGNOSIS — E876 Hypokalemia: Secondary | ICD-10-CM | POA: Diagnosis present

## 2015-04-07 LAB — TROPONIN I
Troponin I: <0.03 ng/mL (ref <0.031)
Troponin I: 0.03 ng/mL (ref <0.031)

## 2015-04-07 LAB — CBC WITH DIFFERENTIAL/PLATELET
BASOS ABS: 0 10*3/uL (ref 0.0–0.1)
BASOS PCT: 1 %
Eosinophils Absolute: 0.1 10*3/uL (ref 0.0–0.7)
Eosinophils Relative: 2 %
HEMATOCRIT: 33.7 % — AB (ref 36.0–46.0)
HEMOGLOBIN: 11.8 g/dL — AB (ref 12.0–15.0)
LYMPHS PCT: 58 %
Lymphs Abs: 2.6 10*3/uL (ref 0.7–4.0)
MCH: 32.7 pg (ref 26.0–34.0)
MCHC: 35 g/dL (ref 30.0–36.0)
MCV: 93.4 fL (ref 78.0–100.0)
Monocytes Absolute: 0.3 10*3/uL (ref 0.1–1.0)
Monocytes Relative: 6 %
NEUTROS ABS: 1.4 10*3/uL — AB (ref 1.7–7.7)
NEUTROS PCT: 33 %
Platelets: 210 10*3/uL (ref 150–400)
RBC: 3.61 MIL/uL — ABNORMAL LOW (ref 3.87–5.11)
RDW: 14.7 % (ref 11.5–15.5)
WBC: 4.4 10*3/uL (ref 4.0–10.5)

## 2015-04-07 LAB — BRAIN NATRIURETIC PEPTIDE: B NATRIURETIC PEPTIDE 5: 100 pg/mL (ref 0.0–100.0)

## 2015-04-07 LAB — RAPID URINE DRUG SCREEN, HOSP PERFORMED
AMPHETAMINES: NOT DETECTED
BARBITURATES: NOT DETECTED
BENZODIAZEPINES: NOT DETECTED
COCAINE: NOT DETECTED
OPIATES: NOT DETECTED
TETRAHYDROCANNABINOL: NOT DETECTED

## 2015-04-07 LAB — BASIC METABOLIC PANEL
ANION GAP: 9 (ref 5–15)
BUN: 16 mg/dL (ref 6–20)
CALCIUM: 9.1 mg/dL (ref 8.9–10.3)
CO2: 26 mmol/L (ref 22–32)
Chloride: 105 mmol/L (ref 101–111)
Creatinine, Ser: 0.85 mg/dL (ref 0.44–1.00)
GLUCOSE: 101 mg/dL — AB (ref 65–99)
POTASSIUM: 3.3 mmol/L — AB (ref 3.5–5.1)
Sodium: 140 mmol/L (ref 135–145)

## 2015-04-07 LAB — D-DIMER, QUANTITATIVE: D-Dimer, Quant: 0.32 ug/mL-FEU (ref 0.00–0.50)

## 2015-04-07 LAB — ETHANOL: ALCOHOL ETHYL (B): 164 mg/dL — AB (ref <5)

## 2015-04-07 LAB — MAGNESIUM: Magnesium: 2.1 mg/dL (ref 1.7–2.4)

## 2015-04-07 MED ORDER — POTASSIUM CHLORIDE CRYS ER 20 MEQ PO TBCR
40.0000 meq | EXTENDED_RELEASE_TABLET | Freq: Once | ORAL | Status: AC
  Administered 2015-04-07: 40 meq via ORAL
  Filled 2015-04-07: qty 2

## 2015-04-07 MED ORDER — VITAMIN B-1 100 MG PO TABS
100.0000 mg | ORAL_TABLET | Freq: Every day | ORAL | Status: DC
  Administered 2015-04-07 – 2015-04-08 (×2): 100 mg via ORAL
  Filled 2015-04-07 (×2): qty 1

## 2015-04-07 MED ORDER — LORAZEPAM 2 MG/ML IJ SOLN: 1.0000 mg | Freq: Four times a day (QID) | INTRAMUSCULAR | Status: DC | PRN

## 2015-04-07 MED ORDER — ACETAMINOPHEN 650 MG RE SUPP: 650.0000 mg | Freq: Four times a day (QID) | RECTAL | Status: DC | PRN

## 2015-04-07 MED ORDER — ENOXAPARIN SODIUM 40 MG/0.4ML ~~LOC~~ SOLN
40.0000 mg | SUBCUTANEOUS | Status: DC
  Administered 2015-04-07 – 2015-04-08 (×2): 40 mg via SUBCUTANEOUS
  Filled 2015-04-07 (×2): qty 0.4

## 2015-04-07 MED ORDER — FOLIC ACID 1 MG PO TABS
1.0000 mg | ORAL_TABLET | Freq: Every day | ORAL | Status: DC
  Administered 2015-04-07 – 2015-04-08 (×2): 1 mg via ORAL
  Filled 2015-04-07 (×2): qty 1

## 2015-04-07 MED ORDER — HYDROMORPHONE HCL 1 MG/ML IJ SOLN: 0.5000 mg | INTRAMUSCULAR | Status: DC | PRN

## 2015-04-07 MED ORDER — SODIUM CHLORIDE 0.9 % IJ SOLN
3.0000 mL | Freq: Two times a day (BID) | INTRAMUSCULAR | Status: DC
  Administered 2015-04-07: 3 mL via INTRAVENOUS

## 2015-04-07 MED ORDER — SODIUM CHLORIDE 0.9 % IV SOLN
INTRAVENOUS | Status: DC
  Administered 2015-04-07 – 2015-04-08 (×2): via INTRAVENOUS

## 2015-04-07 MED ORDER — NITROGLYCERIN 0.4 MG SL SUBL
0.4000 mg | SUBLINGUAL_TABLET | SUBLINGUAL | Status: DC | PRN
  Administered 2015-04-07: 0.4 mg via SUBLINGUAL
  Filled 2015-04-07: qty 1

## 2015-04-07 MED ORDER — LORAZEPAM 1 MG PO TABS: 1.0000 mg | ORAL_TABLET | Freq: Four times a day (QID) | ORAL | Status: DC | PRN

## 2015-04-07 MED ORDER — LORAZEPAM 1 MG PO TABS: 0.0000 mg | ORAL_TABLET | Freq: Four times a day (QID) | ORAL | Status: DC

## 2015-04-07 MED ORDER — ASPIRIN EC 325 MG PO TBEC
325.0000 mg | DELAYED_RELEASE_TABLET | Freq: Every day | ORAL | Status: DC
  Administered 2015-04-07 – 2015-04-08 (×2): 325 mg via ORAL
  Filled 2015-04-07 (×2): qty 1

## 2015-04-07 MED ORDER — NICOTINE 7 MG/24HR TD PT24
7.0000 mg | MEDICATED_PATCH | Freq: Every day | TRANSDERMAL | Status: DC
  Administered 2015-04-07: 7 mg via TRANSDERMAL
  Filled 2015-04-07 (×5): qty 1

## 2015-04-07 MED ORDER — LORAZEPAM 1 MG PO TABS: 0.0000 mg | ORAL_TABLET | Freq: Two times a day (BID) | ORAL | Status: DC

## 2015-04-07 MED ORDER — LOSARTAN POTASSIUM 50 MG PO TABS
50.0000 mg | ORAL_TABLET | Freq: Every day | ORAL | Status: DC
  Administered 2015-04-07 – 2015-04-08 (×2): 50 mg via ORAL
  Filled 2015-04-07 (×2): qty 1

## 2015-04-07 MED ORDER — THIAMINE HCL 100 MG/ML IJ SOLN: 100.0000 mg | Freq: Every day | INTRAMUSCULAR | Status: DC

## 2015-04-07 MED ORDER — ONDANSETRON HCL 4 MG/2ML IJ SOLN: 4.0000 mg | Freq: Four times a day (QID) | INTRAMUSCULAR | Status: DC | PRN

## 2015-04-07 MED ORDER — ALUM & MAG HYDROXIDE-SIMETH 200-200-20 MG/5ML PO SUSP: 30.0000 mL | Freq: Four times a day (QID) | ORAL | Status: DC | PRN

## 2015-04-07 MED ORDER — POTASSIUM CHLORIDE CRYS ER 20 MEQ PO TBCR
20.0000 meq | EXTENDED_RELEASE_TABLET | Freq: Every day | ORAL | Status: DC
  Administered 2015-04-07 – 2015-04-08 (×2): 20 meq via ORAL
  Filled 2015-04-07 (×2): qty 1

## 2015-04-07 MED ORDER — ATORVASTATIN CALCIUM 40 MG PO TABS
80.0000 mg | ORAL_TABLET | Freq: Every day | ORAL | Status: DC
  Administered 2015-04-07: 80 mg via ORAL
  Filled 2015-04-07: qty 2

## 2015-04-07 MED ORDER — OXYCODONE HCL 5 MG PO TABS
5.0000 mg | ORAL_TABLET | ORAL | Status: DC | PRN
  Administered 2015-04-07: 5 mg via ORAL
  Filled 2015-04-07: qty 1

## 2015-04-07 MED ORDER — ASPIRIN 81 MG PO CHEW
324.0000 mg | CHEWABLE_TABLET | Freq: Once | ORAL | Status: AC
  Administered 2015-04-07: 324 mg via ORAL
  Filled 2015-04-07: qty 4

## 2015-04-07 MED ORDER — ADULT MULTIVITAMIN W/MINERALS CH
1.0000 | ORAL_TABLET | Freq: Every day | ORAL | Status: DC
  Administered 2015-04-07 – 2015-04-08 (×2): 1 via ORAL
  Filled 2015-04-07 (×2): qty 1

## 2015-04-07 MED ORDER — CLOPIDOGREL BISULFATE 75 MG PO TABS
75.0000 mg | ORAL_TABLET | Freq: Every day | ORAL | Status: DC
  Administered 2015-04-07 – 2015-04-08 (×2): 75 mg via ORAL
  Filled 2015-04-07 (×2): qty 1

## 2015-04-07 MED ORDER — ACETAMINOPHEN 325 MG PO TABS: 650.0000 mg | ORAL_TABLET | Freq: Four times a day (QID) | ORAL | Status: DC | PRN

## 2015-04-07 MED ORDER — ONDANSETRON HCL 4 MG PO TABS: 4.0000 mg | ORAL_TABLET | Freq: Four times a day (QID) | ORAL | Status: DC | PRN

## 2015-04-07 MED ORDER — FUROSEMIDE 20 MG PO TABS: 20.0000 mg | ORAL_TABLET | ORAL | Status: DC | PRN

## 2015-04-07 MED ORDER — NITROGLYCERIN 2 % TD OINT
0.5000 [in_us] | TOPICAL_OINTMENT | Freq: Four times a day (QID) | TRANSDERMAL | Status: DC
  Administered 2015-04-07 – 2015-04-08 (×5): 0.5 [in_us] via TOPICAL
  Filled 2015-04-07 (×5): qty 1

## 2015-04-07 MED ORDER — CARVEDILOL 3.125 MG PO TABS
6.2500 mg | ORAL_TABLET | Freq: Two times a day (BID) | ORAL | Status: DC
  Administered 2015-04-07 – 2015-04-08 (×3): 6.25 mg via ORAL
  Filled 2015-04-07 (×3): qty 2

## 2015-04-07 NOTE — ED Provider Notes (Signed)
CSN: [id number]     Arrival date & time 04/07/15  0057 History   First MD Initiated Contact with Patient 04/07/15 0118     Chief Complaint  Patient presents with  . Chest Pain     (Consider location/radiation/quality/duration/timing/severity/associated sxs/prior Treatment) Patient is a 59 y.o. female presenting with chest pain. The history is provided by the patient.  Chest Pain She has been having sharp right-sided chest pains for the last several nights. She states that pain is associated with dyspnea, nausea, diaphoresis. She has noted exertional dyspnea with walking about 20 feet. Pain is also worse with exertion but nothing makes it better. She currently rates pain at 6/10 but it has been a severe as 8/10. She is also noted some swelling in her legs. Separate complaint is that she is having difficulty hearing. Of note, she did have a cardiac stent placed recently. She claims compliance with her medication regimen and she denies any illicit drug use.  Past Medical History  Diagnosis Date  . Essential hypertension   . Depression   . Hyperlipidemia with target LDL less than 70   . Alcohol abuse   . History of cocaine use   . Herpes simplex virus (HSV) infection 2016    Antibodies  . GERD (gastroesophageal reflux disease)   . Pneumonia 01/2015  . HSV (herpes simplex virus) infection     hx/notes 03/02/2015  . CAD S/P RCA (in 2015 then 03/16/2015) 03/17/2015    a. Reportedly 2 stents placed [place], [name]; cath 03/16/2015 Mid RCA to Dist RCA lesion, 75%-90% stenosed. Moderate to severe disease in the proximal to mid RCA. Post intervention with overlapping drug-eluting stents, 2.5 x 38 mmSynergy and 2.75 x 4mm Synergy- postdilated to 3.3 mm, there is a 0% residual stenosis.   [name] History of MI (myocardial infarction)     "they say I've had one sometime down the line" (03/16/2015)  . Cardiomyopathy, ischemic - suggest prior Anterior & Inferio MI     Echo [id number]: Systolic function  was severely reduced. EF~ 20%. ~Grade 1 diastolic dysfunction. Severe HK of entire anterior myocardium; Mod HK of the basal-mid anteroseptal, mid inferolateral, and mid anterolateral myocardium; mild HK of the mid inferoseptal and basal inferolateral myocardium.    Past Surgical History  Procedure Laterality Date  . Tubal ligation  1983  . Cardiac catheterization N/A 03/16/2015    Procedure: Right/Left Heart Cath and Coronary Angiography;  Surgeon: Jettie Booze, MD;  Location: [facility];  Service: Cardiovascular;  Laterality: N/A;  . Cardiac catheterization Right 03/16/2015    Procedure: Coronary Stent Intervention;  Surgeon: Jettie Booze, MD;  Location: [facility];  Service: Cardiovascular;  Laterality: Right;  . Coronary angioplasty with stent placement  2015    `2 stents"   Family History  Problem Relation Age of Onset  . Breast cancer Sister   . Hypertension Brother    Social History  Substance Use Topics  . Smoking status: Current Every Day Smoker -- 0.12 packs/day for 32 years    Types: Cigarettes  . Smokeless tobacco: Never Used  . Alcohol Use: 11.4 oz/week    0 Standard drinks or equivalent, 19 Cans of beer per week     Comment: 03/16/2015 "2, 16oz beers/day"   OB History    Gravida Para Term Preterm AB TAB SAB Ectopic Multiple Living   3 3        3      Review of Systems  Cardiovascular: Positive for chest pain.  All other systems reviewed and are negative.     Allergies  Review of patient's allergies indicates no known allergies.  Home Medications   Prior to Admission medications   Medication Sig Start Date End Date Taking? Authorizing Provider  aspirin 81 MG tablet Take 81 mg by mouth daily.   Yes Historical Provider, MD  atorvastatin (LIPITOR) 80 MG tablet Take 1 tablet (80 mg total) by mouth daily at 6 PM. 03/17/15  Yes Almyra Deforest, PA  carvedilol (COREG) 6.25 MG tablet Take 1 tablet (6.25 mg total) by mouth 2 (two) times daily.  03/02/15  Yes Satira Sark, MD  clopidogrel (PLAVIX) 75 MG tablet Take 1 tablet (75 mg total) by mouth daily. 03/17/15  Yes Almyra Deforest, PA  hydrochlorothiazide (HYDRODIURIL) 12.5 MG tablet Take 1 tablet (12.5 mg total) by mouth daily. 11/25/14  Yes Kristen N Ward, DO  losartan (COZAAR) 50 MG tablet Take 1 tablet (50 mg total) by mouth daily. 01/18/15  Yes Satira Sark, MD  nitroGLYCERIN (NITROSTAT) 0.4 MG SL tablet Place 1 tablet (0.4 mg total) under the tongue every 5 (five) minutes as needed for chest pain. 03/17/15  Yes Hao Meng, PA   BP 130/61 mmHg  Pulse 70  Temp(Src) 97.9 F (36.6 C) (Oral)  Resp 15  Ht 4\' 9"  (1.448 m)  Wt 105 lb (47.628 kg)  BMI 22.72 kg/m2  SpO2 96% Physical Exam  Nursing note and vitals reviewed.  59 year old female, resting comfortably and in no acute distress. Vital signs are normal. Oxygen saturation is 96%, which is normal. Head is normocephalic and atraumatic. PERRLA, EOMI. Oropharynx is clear. Neck is nontender and supple without adenopathy. JVD is present at 90. Back is nontender and there is no CVA tenderness. Lungs are few bibasilar rales without wheezes or rhonchi. Chest is nontender. Heart has regular rate and rhythm without murmur. Abdomen is soft, flat, nontender without masses or hepatosplenomegaly and peristalsis is normoactive. Extremities have trace edema, full range of motion is present. Skin is warm and dry without rash. Neurologic: Mental status is normal, cranial nerves are intact, there are no motor or sensory deficits.  ED Course  Procedures (including critical care time) Labs Review Results for orders placed or performed during the hospital encounter of 04/07/15  Troponin I  Result Value Ref Range   Troponin I <0.03 <0.031 ng/mL  Brain natriuretic peptide  Result Value Ref Range   B Natriuretic Peptide 100.0 0.0 - 100.0 pg/mL  Basic metabolic panel  Result Value Ref Range   Sodium 140 135 - 145 mmol/L   Potassium 3.3  (L) 3.5 - 5.1 mmol/L   Chloride 105 101 - 111 mmol/L   CO2 26 22 - 32 mmol/L   Glucose, Bld 101 (H) 65 - 99 mg/dL   BUN 16 6 - 20 mg/dL   Creatinine, Ser 0.85 0.44 - 1.00 mg/dL   Calcium 9.1 8.9 - 10.3 mg/dL   GFR calc non Af Amer >60 >60 mL/min   GFR calc Af Amer >60 >60 mL/min   Anion gap 9 5 - 15  CBC with Differential  Result Value Ref Range   WBC 4.4 4.0 - 10.5 K/uL   RBC 3.61 (L) 3.87 - 5.11 MIL/uL   Hemoglobin 11.8 (L) 12.0 - 15.0 g/dL   HCT 33.7 (L) 36.0 - 46.0 %   MCV 93.4 78.0 - 100.0 fL   MCH 32.7 26.0 - 34.0 pg   MCHC  35.0 30.0 - 36.0 g/dL   RDW 14.7 11.5 - 15.5 %   Platelets 210 150 - 400 K/uL   Neutrophils Relative % 33 %   Neutro Abs 1.4 (L) 1.7 - 7.7 K/uL   Lymphocytes Relative 58 %   Lymphs Abs 2.6 0.7 - 4.0 K/uL   Monocytes Relative 6 %   Monocytes Absolute 0.3 0.1 - 1.0 K/uL   Eosinophils Relative 2 %   Eosinophils Absolute 0.1 0.0 - 0.7 K/uL   Basophils Relative 1 %   Basophils Absolute 0.0 0.0 - 0.1 K/uL  D-dimer, quantitative  Result Value Ref Range   D-Dimer, Quant 0.32 0.00 - 0.50 ug/mL-FEU   Imaging Review Dg Chest 2 View  04/07/2015  CLINICAL DATA:  Chest pain EXAM: CHEST  2 VIEW COMPARISON:  02/01/2015 FINDINGS: Mild cardiomegaly. There is extensive coronary stenting. Negative aortic and hilar contours. There is no edema, consolidation, effusion, or pneumothorax. Symmetric bilateral nipple shadows. IMPRESSION: No active cardiopulmonary disease. Electronically Signed   By: Monte Fantasia M.D.   On: 04/07/2015 02:54   I have personally reviewed and evaluated these images and lab results as part of my medical decision-making.   EKG Interpretation   Date/Time:  Thursday April 07 2015 01:14:11 EST Ventricular Rate:  64 PR Interval:  147 QRS Duration: 97 QT Interval:  452 QTC Calculation: 466 R Axis:   82 Text Interpretation:  Sinus rhythm Left ventricular hypertrophy Abnormal  T, consider ischemia, lateral leads Nonspecific ST abnormality  When  compared with ECG of 03/17/2015, Nonspecific ST abnormality is now Present  Confirmed by [facility]  MD, Jamea Robicheaux ([id number]) on 04/07/2015 1:18:29 AM      MDM   Final diagnoses:  Chest pain, unspecified chest pain type    Chest pain and exertional dyspnea. Old records are reviewed and she can have cardiac catheterization with stent placement in the right coronary artery. Following stent placement, only residual stenosis was 50% lesion at the takeoff of the second diagonal. She is noted to have moderate left ventricular systolic dysfunction without mention of ejection fraction but left ventricular end-diastolic pressure was normal. Today, ECG does show some slight ST changes. She will need serial troponins. Since pain is right-sided and sharp, will get d-dimer to screen for pulmonary embolism.  D-dimer has come back negative. Troponin is also normal. She had complete relief of pain with a single dose of nitroglycerin. Case is discussed with Dr. Arnoldo Morale of [facility] who agrees to admit the patient under observation status for serial troponin levels.  Delora Fuel, MD [phone] [id number]

## 2015-04-07 NOTE — Progress Notes (Signed)
Subjective: Patient was admitted due to chest pain. She feels better today. Her pain has subsided.  Objective: Vital signs in last 24 hours: Temp:  [97.8 F (36.6 C)-97.9 F (36.6 C)] 97.8 F (36.6 C) (12/22 0542) Pulse Rate:  [55-76] 58 (12/22 0542) Resp:  [11-16] 16 (12/22 0542) BP: (117-146)/(47-75) 125/47 mmHg (12/22 0542) SpO2:  [90 %-100 %] 100 % (12/22 0542) Weight:  [47.537 kg (104 lb 12.8 oz)-47.628 kg (105 lb)] 47.537 kg (104 lb 12.8 oz) (12/22 0542) Weight change:  Last BM Date: 04/07/15  Intake/Output from previous day:    PHYSICAL EXAM General appearance: alert and no distress Resp: diminished breath sounds bilaterally and rhonchi bilaterally Cardio: S1, S2 normal GI: soft, non-tender; bowel sounds normal; no masses,  no organomegaly Extremities: extremities normal, atraumatic, no cyanosis or edema  Lab Results:  Results for orders placed or performed during the hospital encounter of 04/07/15 (from the past 48 hour(s))  Troponin I     Status: None   Collection Time: 04/07/15  1:45 AM  Result Value Ref Range   Troponin I <0.03 <0.031 ng/mL    Comment:        NO INDICATION OF MYOCARDIAL INJURY.   Brain natriuretic peptide     Status: None   Collection Time: 04/07/15  1:45 AM  Result Value Ref Range   B Natriuretic Peptide 100.0 0.0 - 100.0 pg/mL  Basic metabolic panel     Status: Abnormal   Collection Time: 04/07/15  1:45 AM  Result Value Ref Range   Sodium 140 135 - 145 mmol/L   Potassium 3.3 (L) 3.5 - 5.1 mmol/L   Chloride 105 101 - 111 mmol/L   CO2 26 22 - 32 mmol/L   Glucose, Bld 101 (H) 65 - 99 mg/dL   BUN 16 6 - 20 mg/dL   Creatinine, Ser 0.85 0.44 - 1.00 mg/dL   Calcium 9.1 8.9 - 10.3 mg/dL   GFR calc non Af Amer >60 >60 mL/min   GFR calc Af Amer >60 >60 mL/min    Comment: (NOTE) The eGFR has been calculated using the CKD EPI equation. This calculation has not been validated in all clinical situations. eGFR's persistently <60 mL/min signify  possible Chronic Kidney Disease.    Anion gap 9 5 - 15  CBC with Differential     Status: Abnormal   Collection Time: 04/07/15  1:45 AM  Result Value Ref Range   WBC 4.4 4.0 - 10.5 K/uL   RBC 3.61 (L) 3.87 - 5.11 MIL/uL   Hemoglobin 11.8 (L) 12.0 - 15.0 g/dL   HCT 33.7 (L) 36.0 - 46.0 %   MCV 93.4 78.0 - 100.0 fL   MCH 32.7 26.0 - 34.0 pg   MCHC 35.0 30.0 - 36.0 g/dL   RDW 14.7 11.5 - 15.5 %   Platelets 210 150 - 400 K/uL   Neutrophils Relative % 33 %   Neutro Abs 1.4 (L) 1.7 - 7.7 K/uL   Lymphocytes Relative 58 %   Lymphs Abs 2.6 0.7 - 4.0 K/uL   Monocytes Relative 6 %   Monocytes Absolute 0.3 0.1 - 1.0 K/uL   Eosinophils Relative 2 %   Eosinophils Absolute 0.1 0.0 - 0.7 K/uL   Basophils Relative 1 %   Basophils Absolute 0.0 0.0 - 0.1 K/uL  D-dimer, quantitative     Status: None   Collection Time: 04/07/15  1:45 AM  Result Value Ref Range   D-Dimer, Quant 0.32 0.00 - 0.50 ug/mL-FEU  Comment: (NOTE) At the manufacturer cut-off of 0.50 ug/mL FEU, this assay has been documented to exclude PE with a sensitivity and negative predictive value of 97 to 99%.  At this time, this assay has not been approved by the FDA to exclude DVT/VTE. Results should be correlated with clinical presentation.   Ethanol     Status: Abnormal   Collection Time: 04/07/15  5:38 AM  Result Value Ref Range   Alcohol, Ethyl (B) 164 (H) <5 mg/dL    Comment:        LOWEST DETECTABLE LIMIT FOR SERUM ALCOHOL IS 5 mg/dL FOR MEDICAL PURPOSES ONLY   Magnesium     Status: None   Collection Time: 04/07/15  5:39 AM  Result Value Ref Range   Magnesium 2.1 1.7 - 2.4 mg/dL  Troponin I     Status: None   Collection Time: 04/07/15  5:39 AM  Result Value Ref Range   Troponin I <0.03 <0.031 ng/mL    Comment:        NO INDICATION OF MYOCARDIAL INJURY.     ABGS No results for input(s): PHART, PO2ART, TCO2, HCO3 in the last 72 hours.  Invalid input(s): PCO2 CULTURES No results found for this or any  previous visit (from the past 240 hour(s)). Studies/Results: Dg Chest 2 View  04/07/2015  CLINICAL DATA:  Chest pain EXAM: CHEST  2 VIEW COMPARISON:  02/01/2015 FINDINGS: Mild cardiomegaly. There is extensive coronary stenting. Negative aortic and hilar contours. There is no edema, consolidation, effusion, or pneumothorax. Symmetric bilateral nipple shadows. IMPRESSION: No active cardiopulmonary disease. Electronically Signed   By: Monte Fantasia M.D.   On: 04/07/2015 02:54    Medications: I have reviewed the patient's current medications.  Assesment:   Principal Problem:   Chest pain Active Problems:   Alcohol dependence (HCC)   Cocaine abuse   Essential hypertension   Cardiomyopathy, ischemic - suggest prior Anterior & Inferio MI   Hyperlipidemia with target LDL less than 70   Tobacco abuse   Hypokalemia    Plan:  Medications reviewed Continue telemetry Cardiology consult Continue current treatment      Jalila Goodnough 04/07/2015, 8:37 AM

## 2015-04-07 NOTE — Consult Note (Signed)
CARDIOLOGY CONSULT NOTE   Patient ID: Joanna Farmer MRN: [id number] DOB/AGE: 59-Jan-1957 59 y.o.  Admit Date: 04/07/2015 Referring Physician: Legrand Rams, MD Primary Physician: Rosita Fire, MD Consulting Cardiologist: Harrington Challenger MD Primary Cardiologist: Rozann Lesches MD Reason for Consultation: Recurrent chest pain with known CAD  Clinical Summary Joanna Farmer is a 59 y.o.female with known CAD with recent discharge from [facility] on 03/17/2015 after admission for angina with cardiac cath demonstrating RCA stenosis of 90% jut before the beginning of the previously placed stent,  with in-stent restenosis in the distal RCA. Receiving overlapping stent DES to the proximal and mid RCA. EF per cardiac cath, 35%-40%. Other history of Hypertension, Hyperlipidemia, cocaine abuse. She was placed on DAPT with ASA and Plavix.She also has severe systolic dysfunction with most recent EF of 20% per echo in October 2016.   She presented to ER with complaints of recurrent chest pain. BP 130/61, HR 70, O2 Sat 96%. UDS negative for illicit drugs.ETOH level was elevated at 164.  Troponin negative X 2. . Potassium 3.3. Magnesium 2.0. CXR negative for pneumonia or CHF. EKG NSR with deepened T-wave inversions in II-III V5 and V6. She was treated with potassium ASA, and NTG sublingual.   She admits to drinking heavily with a minimum of 5 beers a day sometimes more. She states the pain was on the right side, fleeting , sharp not like her pain on the left side prior to stents. No associated dyspnea.  She denies medical non-compliance. She states she has had some LEE but not severe.   No Known Allergies  Medications Scheduled Medications: . aspirin EC  325 mg Oral Daily  . atorvastatin  80 mg Oral q1800  . carvedilol  6.25 mg Oral BID  . clopidogrel  75 mg Oral Daily  . enoxaparin (LOVENOX) injection  40 mg Subcutaneous Q24H  . folic acid  1 mg Oral Daily  . LORazepam  0-4 mg Oral Q6H   Followed by  . [START  ON 04/09/2015] LORazepam  0-4 mg Oral Q12H  . losartan  50 mg Oral Daily  . multivitamin with minerals  1 tablet Oral Daily  . nicotine  7 mg Transdermal Daily  . nitroGLYCERIN  0.5 inch Topical 4 times per day  . sodium chloride  3 mL Intravenous Q12H  . thiamine  100 mg Oral Daily   Or  . thiamine  100 mg Intravenous Daily    Infusions: . sodium chloride 75 mL/hr at 04/07/15 1029    PRN Medications: acetaminophen **OR** acetaminophen, alum & mag hydroxide-simeth, HYDROmorphone (DILAUDID) injection, LORazepam **OR** LORazepam, nitroGLYCERIN, ondansetron **OR** ondansetron (ZOFRAN) IV, oxyCODONE   Past Medical History  Diagnosis Date  . Essential hypertension   . Depression   . Hyperlipidemia with target LDL less than 70   . Alcohol abuse   . History of cocaine use   . Herpes simplex virus (HSV) infection 2016    Antibodies  . GERD (gastroesophageal reflux disease)   . Pneumonia 01/2015  . HSV (herpes simplex virus) infection     hx/notes 03/02/2015  . CAD S/P RCA (in 2015 then 03/16/2015) 03/17/2015    a. Reportedly 2 stents placed [place], [name]; cath 03/16/2015 Mid RCA to Dist RCA lesion, 75%-90% stenosed. Moderate to severe disease in the proximal to mid RCA. Post intervention with overlapping drug-eluting stents, 2.5 x 38 mmSynergy and 2.75 x 67mm Synergy- postdilated to 3.3 mm, there is a 0% residual stenosis.   Marland Farmer History of MI (  myocardial infarction)     "they say I've had one sometime down the line" (03/16/2015)  . Cardiomyopathy, ischemic - suggest prior Anterior & Inferio MI     Echo 123XX123: Systolic function was severely reduced. EF~ 20%. ~Grade 1 diastolic dysfunction. Severe HK of entire anterior myocardium; Mod HK of the basal-mid anteroseptal, mid inferolateral, and mid anterolateral myocardium; mild HK of the mid inferoseptal and basal inferolateral myocardium.     Past Surgical History  Procedure Laterality Date  . Tubal ligation  1983  . Cardiac  catheterization N/A 03/16/2015    Procedure: Right/Left Heart Cath and Coronary Angiography;  Surgeon: Jettie Booze, MD;  Location: [facility];  Service: Cardiovascular;  Laterality: N/A;  . Cardiac catheterization Right 03/16/2015    Procedure: Coronary Stent Intervention;  Surgeon: Jettie Booze, MD;  Location: [facility];  Service: Cardiovascular;  Laterality: Right;  . Coronary angioplasty with stent placement  2015    `2 stents"    Family History  Problem Relation Age of Onset  . Breast cancer Sister   . Hypertension Brother     Social History Joanna Farmer reports that she has been smoking Cigarettes.  She has a 3.84 pack-year smoking history. She has never used smokeless tobacco. Joanna Farmer reports that she drinks about 11.4 oz of alcohol per week.  Review of Systems Complete review of systems are found to be negative unless outlined in H&P above.  Physical Examination Blood pressure 120/53, pulse 90, temperature 98 F (36.7 C), temperature source Oral, resp. rate 18, height 4\' 9"  (1.448 m), weight 104 lb 12.8 oz (47.537 kg), SpO2 100 %. No intake or output data in the 24 hours ending 04/07/15 1304  Telemetry: NSR with LVH  GEN: No acute distress  HEENT: Conjunctiva and lids normal, oropharynx clear with moist mucosa. Neck: Supple, no elevated JVP or carotid bruits, no thyromegaly. Lungs: Clear to auscultation, nonlabored breathing at rest. Cardiac: Regular rate and rhythm, distant heart sounds,  no S3 or significant systolic murmur, no pericardial rub. Abdomen: Soft, nontender, no hepatomegaly, bowel sounds present, no guarding or rebound. Extremities: No pitting edema, distal pulses 2+. Skin: Warm and dry. Musculoskeletal: No kyphosis. Neuropsychiatric: Alert and oriented x3, affect grossly appropriate.  Prior Cardiac Testing/Procedures 1.Cardiac Cath 03/17/2015  Mid RCA lesion, 90% stenosed just before the beginning of the previously  placed stent. In-stent restenosis in the distal RCA  Mid RCA to Dist RCA lesion, 75%-90% stenosed. Moderate to severe disease in the proximal to mid RCA. Post intervention with overlapping drug-eluting stents, 2.5 x 38 mmSynergy and 2.75 x 53mm Synergy- postdilated to 3.3 mm, there is a 0% residual stenosis.  There is moderate left ventricular systolic dysfunction.  Patent stent in the mid LAD with only mild in-stent restenosis.  Normal right heart pressures. Normal LVEDP. The left ventricular size is normal. There is moderate left ventricular systolic dysfunction. The left ventricular ejection fraction is 35-45% by visual estimate. There are wall motion abnormalities in the left ventricle. There are segmental wall motion abnormalities in the left ventricle.  2. Echocardiogram 01/23/2015 Left ventricle: The cavity size was mildly dilated. Wall thickness was normal. Systolic function was severely reduced. The estimated ejection fraction was approximately 20%. Doppler parameters are consistent with abnormal left ventricular relaxation (grade 1 diastolic dysfunction). Severe hypokinesis of the entire anterior myocardium; moderate hypokinesis of the basal-mid anteroseptal, mid inferolateral, and mid anterolateral myocardium; mild hypokinesis of the mid inferoseptal and basal inferolateral myocardium. -  Aortic valve: Mildly calcified annulus. Trileaflet. - Mitral valve: Mildly thickened leaflets . There was mild regurgitation. - Left atrium: The atrium was mildly dilated. Lab Results  Basic Metabolic Panel:  Recent Labs Lab 04/07/15 0145 04/07/15 0539  NA 140  --   K 3.3*  --   CL 105  --   CO2 26  --   GLUCOSE 101*  --   BUN 16  --   CREATININE 0.85  --   CALCIUM 9.1  --   MG  --  2.1    CBC:  Recent Labs Lab 04/07/15 0145  WBC 4.4  NEUTROABS 1.4*  HGB 11.8*  HCT 33.7*  MCV 93.4  PLT 210    Cardiac Enzymes:  Recent Labs Lab 04/07/15 0145  04/07/15 0539  TROPONINI <0.03 <0.03    Radiology: Dg Chest 2 View  04/07/2015  CLINICAL DATA:  Chest pain EXAM: CHEST  2 VIEW COMPARISON:  02/01/2015 FINDINGS: Mild cardiomegaly. There is extensive coronary stenting. Negative aortic and hilar contours. There is no edema, consolidation, effusion, or pneumothorax. Symmetric bilateral nipple shadows. IMPRESSION: No active cardiopulmonary disease. Electronically Signed   By: Monte Fantasia M.D.   On: 04/07/2015 02:54     ECG: NSR with LVH and T-wave inversion in the inferior lateral leads.    Impression and Recommendations  1.Chest Pain: Atypical features, sharp, right sided without associated dyspnea or diaphoresis. She was found to be hypokalemic. She is on HCTZ 12.5 mg daily without potassium replacement. She has received replacement but follow up labs are not available. Repeat in am. Will likely need potassium daily.   2. CAD: Hx of Stents to RCA, with in-stent stenosis, and overlapping DES to the proximal and distal RCA. Troponin is negative with no acute ST changes, arguing against ACS. Would continue DAPT with ASA and Plavix, atorvastatin, carvedilol and losartan. She has been pain free since last night and wants to go home. If BMET demonstrates normalization of potassium would send her home on daily potassium supplement to keep level at 4.0 for patients with CAD.   3. Hx of Hypertension: Continue ARB and BB.   4. Systolic Dysfunction: Improved per cardiac cath in early December compared to October increasing from 20% per echo to 35%-40% per cardiac cath. Would recommend prn lasix 20 mg. Continue potassium   5. ETOH abuse; Magnesium level normal. She is to decrease consumption.   Signed: Phill Myron. Lawrence NP [place]  04/07/2015, 1:04 PM  Pt seen and examined  I agree with findings of K lawrence above  CP is atypical  R sided.  I do not think it represents stent problem Volume status is OK Reviewed meds with pt  She needs to cut  back on EtOH  Concerned about risks for GI bleeding   Will f/u labs in AM  Otherwise should be OK to d/c from cardiac standpoint.    Dorris Carnes

## 2015-04-07 NOTE — Care Management Note (Signed)
Case Management Note  Patient Details  Name: Joanna Farmer MRN: [id number] Date of Birth: 1955/05/04  Subjective/Objective:                  Pt admitted from home with CP. Pt lives with family and will return home at discharge. Pt is independent with ADL's.  Action/Plan: No CM needs noted.  Expected Discharge Date:  04/08/15               Expected Discharge Plan:  Home/Self Care  In-House Referral:  NA  Discharge planning Services  CM Consult  Post Acute Care Choice:  NA Choice offered to:  NA  DME Arranged:    DME Agency:     HH Arranged:    HH Agency:     Status of Service:  Completed, signed off  Medicare Important Message Given:    Date Medicare IM Given:    Medicare IM give by:    Date Additional Medicare IM Given:    Additional Medicare Important Message give by:     If discussed at [place] of Stay Meetings, dates discussed:    Additional Comments:  Joylene Draft, RN 04/07/2015, 11:13 AM

## 2015-04-07 NOTE — H&P (Addendum)
[facility] Admission History and Physical       Joanna Farmer [id number] DOB: Aug 11, 1955 DOA: 04/07/2015  Referring physician: EDP PCP: Rosita Fire, MD  Specialists:   Chief Complaint: Chest Pain  HPI: Joanna Farmer is a 59 y.o. female with a history of CAD S/P PCI with DES stent to RCA 11/230/2016, HTN, Hyperlipidemia who presents to the ED with complaints of intermittent Right sided Chest Pain x 2-3 days.  The pain is sharp and rates the pain at a 5/10 at the worst, and is associated with SOB. She denies nausea, vomiting or diaphoresis.   The pain was relieved by NTG.   She was evaluated in the ED and her initial workup was negative and she was referred for a cardia ule out.   Of Note: Patient missed her cardiology follow up appointment.          Review of Systems:  Constitutional: No Weight Loss, No Weight Gain, Night Sweats, Fevers, Chills, Dizziness, Light Headedness, Fatigue, or Generalized Weakness HEENT: No Headaches, Difficulty Swallowing,Tooth/Dental Problems,Sore Throat,  No Sneezing, Rhinitis, Ear Ache, Nasal Congestion, or Post Nasal Drip,  Cardio-vascular:    +Chest pain, Orthopnea, PND, Edema in Lower Extremities, Anasarca, Dizziness, Palpitations  Resp:  +Dyspnea, No DOE, No Productive Cough, No Non-Productive Cough, No Hemoptysis, No Wheezing.    GI: No Heartburn, Indigestion, Abdominal Pain, Nausea, Vomiting, Diarrhea, Constipation, Hematemesis, Hematochezia, Melena, Change in Bowel Habits,  Loss of Appetite  GU: No Dysuria, No Change in Color of Urine, No Urgency or Urinary Frequency, No Flank pain.  Musculoskeletal: No Joint Pain or Swelling, No Decreased Range of Motion, No Back Pain.  Neurologic: No Syncope, No Seizures, Muscle Weakness, Paresthesia, Vision Disturbance or Loss, No Diplopia, No Vertigo, No Difficulty Walking,  Skin: No Rash or Lesions. Psych: No Change in Mood or Affect, No Depression or Anxiety, No Memory loss, No Confusion,  or Hallucinations   Past Medical History  Diagnosis Date  . Essential hypertension   . Depression   . Hyperlipidemia with target LDL less than 70   . Alcohol abuse   . History of cocaine use   . Herpes simplex virus (HSV) infection 2016    Antibodies  . GERD (gastroesophageal reflux disease)   . Pneumonia 01/2015  . HSV (herpes simplex virus) infection     hx/notes 03/02/2015  . CAD S/P RCA (in 2015 then 03/16/2015) 03/17/2015    a. Reportedly 2 stents placed [place], [name]; cath 03/16/2015 Mid RCA to Dist RCA lesion, 75%-90% stenosed. Moderate to severe disease in the proximal to mid RCA. Post intervention with overlapping drug-eluting stents, 2.5 x 38 mmSynergy and 2.75 x 63mm Synergy- postdilated to 3.3 mm, there is a 0% residual stenosis.   [name] History of MI (myocardial infarction)     "they say I've had one sometime down the line" (03/16/2015)  . Cardiomyopathy, ischemic - suggest prior Anterior & Inferio MI     Echo 123XX123: Systolic function was severely reduced. EF~ 20%. ~Grade 1 diastolic dysfunction. Severe HK of entire anterior myocardium; Mod HK of the basal-mid anteroseptal, mid inferolateral, and mid anterolateral myocardium; mild HK of the mid inferoseptal and basal inferolateral myocardium.      Past Surgical History  Procedure Laterality Date  . Tubal ligation  1983  . Cardiac catheterization N/A 03/16/2015    Procedure: Right/Left Heart Cath and Coronary Angiography;  Surgeon: Jettie Booze, MD;  Location: [facility];  Service: Cardiovascular;  Laterality: N/A;  .  Cardiac catheterization Right 03/16/2015    Procedure: Coronary Stent Intervention;  Surgeon: Jettie Booze, MD;  Location: [facility];  Service: Cardiovascular;  Laterality: Right;  . Coronary angioplasty with stent placement  2015    `2 stents"      Prior to Admission medications   Medication Sig Start Date End Date Taking? Authorizing Provider  aspirin 81 MG tablet  Take 81 mg by mouth daily.   Yes Historical Provider, MD  atorvastatin (LIPITOR) 80 MG tablet Take 1 tablet (80 mg total) by mouth daily at 6 PM. 03/17/15  Yes Almyra Deforest, PA  carvedilol (COREG) 6.25 MG tablet Take 1 tablet (6.25 mg total) by mouth 2 (two) times daily. 03/02/15  Yes Satira Sark, MD  clopidogrel (PLAVIX) 75 MG tablet Take 1 tablet (75 mg total) by mouth daily. 03/17/15  Yes Almyra Deforest, PA  hydrochlorothiazide (HYDRODIURIL) 12.5 MG tablet Take 1 tablet (12.5 mg total) by mouth daily. 11/25/14  Yes Kristen N Ward, DO  losartan (COZAAR) 50 MG tablet Take 1 tablet (50 mg total) by mouth daily. 01/18/15  Yes Satira Sark, MD  nitroGLYCERIN (NITROSTAT) 0.4 MG SL tablet Place 1 tablet (0.4 mg total) under the tongue every 5 (five) minutes as needed for chest pain. 03/17/15  Yes Almyra Deforest, PA     No Known Allergies  Social History:  reports that she has been smoking Cigarettes.  She has a 3.84 pack-year smoking history. She has never used smokeless tobacco. She reports that she drinks about 11.4 oz of alcohol per week. She reports that she uses illicit drugs (Marijuana and Cocaine).    Family History  Problem Relation Age of Onset  . Breast cancer Sister   . Hypertension Brother        Physical Exam:  GEN:  Pleasant Thin  59 y.o. African American female examined and in no acute distress; cooperative with exam Filed Vitals:   04/07/15 0200 04/07/15 0343 04/07/15 0400 04/07/15 0430  BP: 117/75 128/64 131/66 135/60  Pulse: 76 55 59 56  Temp:      TempSrc:      Resp: 13 11 14 12   Height:      Weight:      SpO2: 90% 95% 95% 97%   Blood pressure 135/60, pulse 56, temperature 97.9 F (36.6 C), temperature source Oral, resp. rate 12, height 4\' 9"  (1.448 m), weight 47.628 kg (105 lb), SpO2 97 %. PSYCH: She is alert and oriented x4; does not appear anxious does not appear depressed; affect is normal HEENT: Normocephalic and Atraumatic, Mucous membranes pink; PERRLA; EOM intact;  Fundi:  Benign;  No scleral icterus, Nares: Patent, Oropharynx: Clear,    Neck:  FROM, No Cervical Lymphadenopathy nor Thyromegaly or Carotid Bruit; No JVD; Breasts:: Not examined CHEST WALL: No tenderness CHEST: Normal respiration, clear to auscultation bilaterally HEART: Regular rate and rhythm; no murmurs rubs or gallops BACK: No kyphosis or scoliosis; No CVA tenderness ABDOMEN: Positive Bowel Sounds, Scaphoid, Soft Non-Tender, No Rebound or Guarding; No Masses, No Organomegaly. Rectal Exam: Not done EXTREMITIES: No Cyanosis, Clubbing, or Edema; No Ulcerations. Genitalia: not examined PULSES: 2+ and symmetric SKIN: Normal hydration no rash or ulceration CNS:  Alert and Oriented x 4, No Focal Deficits Vascular: pulses palpable throughout    Labs on Admission:  Basic Metabolic Panel:  Recent Labs Lab 04/07/15 0145  NA 140  K 3.3*  CL 105  CO2 26  GLUCOSE 101*  BUN 16  CREATININE  0.85  CALCIUM 9.1   Liver Function Tests: No results for input(s): AST, ALT, ALKPHOS, BILITOT, PROT, ALBUMIN in the last 168 hours. No results for input(s): LIPASE, AMYLASE in the last 168 hours. No results for input(s): AMMONIA in the last 168 hours. CBC:  Recent Labs Lab 04/07/15 0145  WBC 4.4  NEUTROABS 1.4*  HGB 11.8*  HCT 33.7*  MCV 93.4  PLT 210   Cardiac Enzymes:  Recent Labs Lab 04/07/15 0145  TROPONINI <0.03    BNP (last 3 results)  Recent Labs  11/25/14 2155 04/07/15 0145  BNP 202.0* 100.0    ProBNP (last 3 results) No results for input(s): PROBNP in the last 8760 hours.  CBG: No results for input(s): GLUCAP in the last 168 hours.  Radiological Exams on Admission: Dg Chest 2 View  04/07/2015  CLINICAL DATA:  Chest pain EXAM: CHEST  2 VIEW COMPARISON:  02/01/2015 FINDINGS: Mild cardiomegaly. There is extensive coronary stenting. Negative aortic and hilar contours. There is no edema, consolidation, effusion, or pneumothorax. Symmetric bilateral nipple shadows.  IMPRESSION: No active cardiopulmonary disease. Electronically Signed   By: Monte Fantasia M.D.   On: 04/07/2015 02:54     EKG: Independently reviewed. Normal Sinus Rhythm with ST depressions in the Inferior Leads     Assessment/Plan:      59 y.o. female with  Principal Problem:   1.     Chest pain- Hx CAD S/P PCI with DES Stent   Telemetry Monitoring   Cycle Troponins   Nitropaste, O2, ASA, Continue Plavix, Losartan, Carvedilol, and Atorvastatin Rx   Consult Cardiology this AM      Active Problems:   2.     Hypokalemia- due to HCTZ   Hold HCTZ   Replaced KCl with PO KCl      Check Magnesium        3.     Alcohol dependence ([place])   CIWA Protocol with PO Ativan     4.     Cocaine abuse   Check UDS     5.     Essential hypertension   Continue CArvedilol, Losartan Rx   Monitor  BPs     6.     Cardiomyopathy, ischemic - suggest prior Anterior & Inferio MI   Monitor I/Os     7.     Hyperlipidemia with target LDL less than 70   Continue Atorvastatin Rx     8.     Tobacco abuse   Nicotine Patch     9.     DVT Prophylaxis    Lovenox     Code Status:     FULL CODE       Family Communication:  No Family Present    Disposition Plan:    Observation Status        Time spent:   65 Minutes      Theressa Millard [facility] Pager [phone]   If [place] Please Contact the Day Rounding Team MD for [facility]  If 7PM-7AM, Please Contact Night-Floor Coverage  [email or web address] Password [email or web address] 04/07/2015, 4:54 AM     ADDENDUM:   Patient was seen and examined on 04/07/2015

## 2015-04-07 NOTE — ED Notes (Signed)
Pt states she had 2 stents placed at [facility] last week, states she has been having right sided chest pain since Tuesday.

## 2015-04-08 LAB — BASIC METABOLIC PANEL
Anion gap: 5 (ref 5–15)
BUN: 14 mg/dL (ref 6–20)
CHLORIDE: 109 mmol/L (ref 101–111)
CO2: 24 mmol/L (ref 22–32)
Calcium: 8.4 mg/dL — ABNORMAL LOW (ref 8.9–10.3)
Creatinine, Ser: 0.72 mg/dL (ref 0.44–1.00)
Glucose, Bld: 83 mg/dL (ref 65–99)
POTASSIUM: 3.5 mmol/L (ref 3.5–5.1)
Sodium: 138 mmol/L (ref 135–145)

## 2015-04-08 LAB — CBC
HEMATOCRIT: 30.4 % — AB (ref 36.0–46.0)
Hemoglobin: 10.4 g/dL — ABNORMAL LOW (ref 12.0–15.0)
MCH: 32.2 pg (ref 26.0–34.0)
MCHC: 34.2 g/dL (ref 30.0–36.0)
MCV: 94.1 fL (ref 78.0–100.0)
PLATELETS: 194 10*3/uL (ref 150–400)
RBC: 3.23 MIL/uL — AB (ref 3.87–5.11)
RDW: 14.7 % (ref 11.5–15.5)
WBC: 3.3 10*3/uL — AB (ref 4.0–10.5)

## 2015-04-08 MED ORDER — POTASSIUM CHLORIDE ER 20 MEQ PO TBCR: 20.0000 meq | EXTENDED_RELEASE_TABLET | Freq: Every day | ORAL | Status: DC

## 2015-04-08 NOTE — Care Management Note (Signed)
Case Management Note  Patient Details  Name: Joanna Farmer MRN: [id number] Date of Birth: 1955/07/24  Subjective/Objective:                    Action/Plan:   Expected Discharge Date:  04/08/15               Expected Discharge Plan:  Home/Self Care  In-House Referral:  NA  Discharge planning Services  CM Consult  Post Acute Care Choice:  NA Choice offered to:  NA  DME Arranged:    DME Agency:     HH Arranged:    [place] Agency:     Status of Service:  Completed, signed off  Medicare Important Message Given:    Date Medicare IM Given:    Medicare IM give by:    Date Additional Medicare IM Given:    Additional Medicare Important Message give by:     If discussed at [place] of Stay Meetings, dates discussed:    Additional Comments: Pt discharged home today. No CM needs noted. Christinia Gully [place], RN 04/08/2015, 9:15 AM

## 2015-04-08 NOTE — Discharge Summary (Signed)
Physician Discharge Summary  Patient ID: Joanna Farmer MRN: [id number] DOB/AGE: 1955/11/22 59 y.o. Primary Care Physician:Jerimey Burridge, MD Admit date: 04/07/2015 Discharge date: 04/08/2015    Discharge Diagnoses:   Principal Problem:   Chest pain Active Problems:   Alcohol dependence ([place])   Cocaine abuse   Essential hypertension   Cardiomyopathy, ischemic - suggest prior Anterior & Inferio MI   Hyperlipidemia with target LDL less than 70   Tobacco abuse   Hypokalemia     Medication List    TAKE these medications        aspirin 81 MG tablet  Take 81 mg by mouth daily.     atorvastatin 80 MG tablet  Commonly known as:  LIPITOR  Take 1 tablet (80 mg total) by mouth daily at 6 PM.     carvedilol 6.25 MG tablet  Commonly known as:  COREG  Take 1 tablet (6.25 mg total) by mouth 2 (two) times daily.     clopidogrel 75 MG tablet  Commonly known as:  PLAVIX  Take 1 tablet (75 mg total) by mouth daily.     hydrochlorothiazide 12.5 MG tablet  Commonly known as:  HYDRODIURIL  Take 1 tablet (12.5 mg total) by mouth daily.     losartan 50 MG tablet  Commonly known as:  COZAAR  Take 1 tablet (50 mg total) by mouth daily.     nitroGLYCERIN 0.4 MG SL tablet  Commonly known as:  NITROSTAT  Place 1 tablet (0.4 mg total) under the tongue every 5 (five) minutes as needed for chest pain.     Potassium Chloride ER 20 MEQ Tbcr  Take 20 mEq by mouth daily.        Discharged Condition: improved    Consults: cardiology  Significant Diagnostic Studies: Dg Chest 2 View  04/07/2015  CLINICAL DATA:  Chest pain EXAM: CHEST  2 VIEW COMPARISON:  02/01/2015 FINDINGS: Mild cardiomegaly. There is extensive coronary stenting. Negative aortic and hilar contours. There is no edema, consolidation, effusion, or pneumothorax. Symmetric bilateral nipple shadows. IMPRESSION: No active cardiopulmonary disease. Electronically Signed   By: Monte Fantasia M.D.   On: 04/07/2015 02:54     Lab Results: Basic Metabolic Panel:  Recent Labs  04/07/15 0145 04/07/15 0539 04/08/15 0517  NA 140  --  138  K 3.3*  --  3.5  CL 105  --  109  CO2 26  --  24  GLUCOSE 101*  --  83  BUN 16  --  14  CREATININE 0.85  --  0.72  CALCIUM 9.1  --  8.4*  MG  --  2.1  --    Liver Function Tests: No results for input(s): AST, ALT, ALKPHOS, BILITOT, PROT, ALBUMIN in the last 72 hours.   CBC:  Recent Labs  04/07/15 0145 04/08/15 0517  WBC 4.4 3.3*  NEUTROABS 1.4*  --   HGB 11.8* 10.4*  HCT 33.7* 30.4*  MCV 93.4 94.1  PLT 210 194    No results found for this or any previous visit (from the past 240 hour(s)).   Hospital Course:   [name] with history of multiple medical illnesses was admitted due to chest pain. Patient had serial EKG and cardiac enzymes and also evaluated by cardiology. Acute coronary syndrome ruled out. Her potassium was supplemented and on discharge her K+ was 3.5. Patient will be discharged on oral K+ supplement.  Discharge Exam: Blood pressure 160/65, pulse 60, temperature 98.1 F (  36.7 C), temperature source Oral, resp. rate 20, height 4\' 9"  (1.448 m), weight 47.537 kg (104 lb 12.8 oz), SpO2 99 %.   Disposition:  home        Follow-up Information    Follow up with [facility], MD In 1 week.   Specialty:  Internal Medicine   Contact information:   [place] [id number] [phone]       Signed: Rosita Fire   04/08/2015, 8:19 AM

## 2015-04-08 NOTE — Progress Notes (Signed)
IV removed. Discharge instructions reviewed with patient. Understanding verbalized. Awaiting ride for discharge home 

## 2015-04-13 ENCOUNTER — Encounter: Payer: Medicaid Other | Admitting: Cardiology

## 2015-05-05 ENCOUNTER — Encounter: Payer: Self-pay | Admitting: Cardiology

## 2015-05-05 ENCOUNTER — Ambulatory Visit (INDEPENDENT_AMBULATORY_CARE_PROVIDER_SITE_OTHER): Payer: Medicaid Other | Admitting: Cardiology

## 2015-05-05 VITALS — BP 156/90 | HR 92 | Ht 62.0 in | Wt 105.0 lb

## 2015-05-05 DIAGNOSIS — E785 Hyperlipidemia, unspecified: Secondary | ICD-10-CM | POA: Diagnosis not present

## 2015-05-05 DIAGNOSIS — I10 Essential (primary) hypertension: Secondary | ICD-10-CM

## 2015-05-05 DIAGNOSIS — I2510 Atherosclerotic heart disease of native coronary artery without angina pectoris: Secondary | ICD-10-CM

## 2015-05-05 DIAGNOSIS — Z9861 Coronary angioplasty status: Secondary | ICD-10-CM

## 2015-05-05 DIAGNOSIS — I255 Ischemic cardiomyopathy: Secondary | ICD-10-CM | POA: Diagnosis not present

## 2015-05-05 DIAGNOSIS — F1010 Alcohol abuse, uncomplicated: Secondary | ICD-10-CM

## 2015-05-05 DIAGNOSIS — Z720 Tobacco use: Secondary | ICD-10-CM

## 2015-05-05 NOTE — Assessment & Plan Note (Signed)
Echo [id number]: Systolic function was severely reduced. EF~ 20%. Severe anterior HK . EF at cath 03/16/15- 35-40% No CHF complaints

## 2015-05-05 NOTE — Assessment & Plan Note (Signed)
On statin Rx- due for lipids and CMET in two months

## 2015-05-05 NOTE — Progress Notes (Signed)
05/05/2015 Joanna Farmer   1955/08/11  [id number]  Primary Physician Rosita Fire, MD Primary Cardiologist: Dr Domenic Polite  HPI:  60 y.o.AA female with known CAD. She has a history of prior PCI in [place] in 2015 with an LAD and RCA stent placed.  She was evaluated for dyspnea in Oct 2016 and had an echo showing severe LVD with an EF of 20-25% and anterior HK.  Cardiac cath done 03/16/15 demonstrated RCA disease. She had 3 stents placed- new prox RCA, new mid RCA, and distal RCA ISR treated with overlapping DES. Previously placed LAD stent was patent.  Her EF was 35%-40% at cath. Other history remarkable for Hypertension, Hyperlipidemia, smoking, and ETOH abuse. She has a past history of cocaine use but her drug screen was negative in Oct and Dec 2016. She was admitted to [facility] three weeks after she had her PCI for chest pain. She admitted to fairly heavy ETOH abuse-5-6 beers a day. She ruled out and was discharged the next day. She is in the office today for follow up. She denies any chest pain or unusual dyspnea. She reports she is compliant with her medications. She smokes a couple of cigarettes a day and is drinking "very little".               Current Outpatient Prescriptions  Medication Sig Dispense Refill  . aspirin 81 MG tablet Take 81 mg by mouth daily.    [name] atorvastatin (LIPITOR) 80 MG tablet Take 1 tablet (80 mg total) by mouth daily at 6 PM. 30 tablet 11  . carvedilol (COREG) 6.25 MG tablet Take 1 tablet (6.25 mg total) by mouth 2 (two) times daily. 180 tablet 3  . clopidogrel (PLAVIX) 75 MG tablet Take 1 tablet (75 mg total) by mouth daily. 30 tablet 11  . hydrochlorothiazide (HYDRODIURIL) 12.5 MG tablet Take 1 tablet (12.5 mg total) by mouth daily. 30 tablet 0  . losartan (COZAAR) 50 MG tablet Take 1 tablet (50 mg total) by mouth daily. 90 tablet 3  . nitroGLYCERIN (NITROSTAT) 0.4 MG SL tablet Place 1 tablet (0.4 mg total) under the tongue every 5 (five) minutes as needed for chest pain.  25 tablet 3  . potassium chloride 20 MEQ TBCR Take 20 mEq by mouth daily. 30 tablet 3   No current facility-administered medications for this visit.    No Known Allergies  Social History   Social History  . Marital Status: Legally Separated    Spouse Name: N/A  . Number of Children: N/A  . Years of Education: N/A   Occupational History  . Not on file.   Social History Main Topics  . Smoking status: Current Every Day Smoker -- 0.12 packs/day for 32 years    Types: Cigarettes  . Smokeless tobacco: Never Used  . Alcohol Use: 11.4 oz/week    0 Standard drinks or equivalent, 19 Cans of beer per week     Comment: 03/16/2015 "2, 16oz beers/day"  . Drug Use: Yes    Special: Marijuana, Cocaine     Comment: 03/16/2015 "it's been months since I've used either"  . Sexual Activity: Not Currently    Birth Control/ Protection: Post-menopausal   Other Topics Concern  . Not on file   Social History Narrative     Review of Systems: General: negative for chills, fever, night sweats or weight changes.  Cardiovascular: negative for chest pain, dyspnea on exertion, edema, orthopnea, palpitations, paroxysmal nocturnal dyspnea or shortness of breath Dermatological: negative  for rash Respiratory: negative for cough or wheezing Urologic: negative for hematuria Abdominal: negative for nausea, vomiting, diarrhea, bright red blood per rectum, melena, or hematemesis Neurologic: negative for visual changes, syncope, or dizziness "Tingling" in her fingertips in both hands All other systems reviewed and are otherwise negative except as noted above.    Blood pressure 156/90, pulse 92, height 5\' 2"  (1.575 m), weight 105 lb (47.628 kg), SpO2 99 %.  General appearance: alert, cooperative, no distress and poor dentition Neck: no carotid bruit and no JVD Lungs: clear to auscultation bilaterally Heart: regular rate and rhythm Extremities: extremities normal, atraumatic, no cyanosis or edema Skin:  Skin color, texture, turgor normal. No rashes or lesions Neurologic: Grossly normal   ASSESSMENT AND PLAN:   CAD S/P RCA (in 2015 then 03/16/2015) Prior RCA and LAD stent in [facility] 2015 Cath 03/16/15- new RCA stent proximal to previously placed mid RCA stent,  new proximal RCA DES placed, and DES for distal RCA ISR Previously placed RCA and LAD stents patent  No angina  Cardiomyopathy, ischemic  Echo 123XX123: Systolic function was severely reduced. EF~ 20%. Severe anterior HK . EF at cath 03/16/15- 35-40% No CHF complaints  Essential hypertension B/P borderline control  Hyperlipidemia with target LDL less than 70 On statin Rx- due for lipids and CMET in two months  Alcohol abuse Drinking "very little" now  Tobacco abuse Less than 1/2 pk a day    PLAN  We discussed the importance of smoking cessation. Repeat B/P by me was 152/82. I asked her to come back in two weeks for a B/P check with an Therapist, sports. If her systolic B/P remains elevated we should adjust her medications. She was instructed to watch her sodium intake in the meantime. She is due for a CMET and lipid panel in 2 months and should see dr Domenic Polite in 6 months.   Kerin Ransom K PA-C 05/05/2015 1:18 PM

## 2015-05-05 NOTE — Assessment & Plan Note (Addendum)
Prior RCA and LAD stent in [facility] 2015 Cath 03/16/15- new RCA stent proximal to previously placed mid RCA stent,  new proximal RCA DES placed, and DES for distal RCA ISR Previously placed RCA and LAD stents patent  No angina

## 2015-05-05 NOTE — Assessment & Plan Note (Signed)
Drinking "very little" now

## 2015-05-05 NOTE — Patient Instructions (Signed)
Your physician wants you to follow-up in: 6 months with Dr. Domenic Polite. You will receive a reminder letter in the mail two months in advance. If you don't receive a letter, please call our office to schedule the follow-up appointment.  Your physician recommends that you continue on your current medications as directed. Please refer to the Current Medication list given to you today.  Your physician recommends that you schedule a follow-up appointment in: 2 Weeks for a Blood Pressure Check  Your physician recommends that you return for lab work in: 2 Months ( Fasting)   If you need a refill on your cardiac medications before your next appointment, please call your pharmacy.  Thank you for choosing [facility]!

## 2015-05-05 NOTE — Assessment & Plan Note (Signed)
B/P borderline control

## 2015-05-05 NOTE — Assessment & Plan Note (Signed)
Less than 1/2 pk a day

## 2015-05-10 ENCOUNTER — Encounter (HOSPITAL_COMMUNITY): Payer: Medicaid Other

## 2015-05-19 ENCOUNTER — Encounter (HOSPITAL_COMMUNITY): Payer: Medicaid Other

## 2015-05-19 ENCOUNTER — Ambulatory Visit (INDEPENDENT_AMBULATORY_CARE_PROVIDER_SITE_OTHER): Payer: Medicaid Other

## 2015-05-19 VITALS — BP 126/58 | HR 76

## 2015-05-19 DIAGNOSIS — I10 Essential (primary) hypertension: Secondary | ICD-10-CM

## 2015-05-19 NOTE — Patient Instructions (Signed)
Take your medication as directed.I will call you if there are any changes.    Thank you for choosing [facility] !

## 2015-05-19 NOTE — Progress Notes (Signed)
BP check 130/58  Will message provider

## 2015-05-26 ENCOUNTER — Telehealth (INDEPENDENT_AMBULATORY_CARE_PROVIDER_SITE_OTHER): Payer: Self-pay | Admitting: *Deleted

## 2015-05-26 ENCOUNTER — Encounter (INDEPENDENT_AMBULATORY_CARE_PROVIDER_SITE_OTHER): Payer: Self-pay | Admitting: *Deleted

## 2015-05-26 NOTE — Telephone Encounter (Signed)
agree

## 2015-05-26 NOTE — Telephone Encounter (Signed)
Referring MD/PCP: fanat   Procedure: tcs  Reason/Indication:  screening  Has patient had this procedure before?  no  If so, when, by whom and where?    Is there a family history of colon cancer?  no  Who?  What age when diagnosed?    Is patient diabetic?   no      Does patient have prosthetic heart valve or mechanical valve?  no  Do you have a pacemaker?  no  Has patient ever had endocarditis? no  Has patient had joint replacement within last 12 months?  no  Does patient tend to be constipated or take laxatives? no  Does patient have a history of alcohol/drug use?  no  Is patient on Coumadin, Plavix and/or Aspirin? yes  Medications: see epic  Allergies: nkda  Medication Adjustment: asa 2 days, plavix 5 days  Procedure date & time: 06/15/15 at 730

## 2015-06-15 ENCOUNTER — Ambulatory Visit (HOSPITAL_COMMUNITY)
Admission: RE | Admit: 2015-06-15 | Discharge: 2015-06-15 | Disposition: A | Discharge status: Home / Self Care | Payer: Medicaid Other | Source: Ambulatory Visit | Attending: Internal Medicine | Admitting: Internal Medicine

## 2015-06-15 ENCOUNTER — Encounter (HOSPITAL_COMMUNITY): Payer: Self-pay

## 2015-06-15 ENCOUNTER — Encounter (HOSPITAL_COMMUNITY)
Admission: RE | Disposition: A | Discharge status: Home / Self Care | Payer: Self-pay | Source: Ambulatory Visit | Attending: Internal Medicine

## 2015-06-15 DIAGNOSIS — Z7902 Long term (current) use of antithrombotics/antiplatelets: Secondary | ICD-10-CM | POA: Diagnosis not present

## 2015-06-15 DIAGNOSIS — E785 Hyperlipidemia, unspecified: Secondary | ICD-10-CM | POA: Insufficient documentation

## 2015-06-15 DIAGNOSIS — Z803 Family history of malignant neoplasm of breast: Secondary | ICD-10-CM | POA: Insufficient documentation

## 2015-06-15 DIAGNOSIS — D123 Benign neoplasm of transverse colon: Secondary | ICD-10-CM | POA: Diagnosis not present

## 2015-06-15 DIAGNOSIS — Z7982 Long term (current) use of aspirin: Secondary | ICD-10-CM | POA: Diagnosis not present

## 2015-06-15 DIAGNOSIS — Z1211 Encounter for screening for malignant neoplasm of colon: Secondary | ICD-10-CM

## 2015-06-15 DIAGNOSIS — I10 Essential (primary) hypertension: Secondary | ICD-10-CM | POA: Diagnosis not present

## 2015-06-15 DIAGNOSIS — Z79899 Other long term (current) drug therapy: Secondary | ICD-10-CM | POA: Insufficient documentation

## 2015-06-15 DIAGNOSIS — F329 Major depressive disorder, single episode, unspecified: Secondary | ICD-10-CM | POA: Diagnosis not present

## 2015-06-15 DIAGNOSIS — K219 Gastro-esophageal reflux disease without esophagitis: Secondary | ICD-10-CM | POA: Diagnosis not present

## 2015-06-15 DIAGNOSIS — D125 Benign neoplasm of sigmoid colon: Secondary | ICD-10-CM | POA: Diagnosis not present

## 2015-06-15 DIAGNOSIS — D124 Benign neoplasm of descending colon: Secondary | ICD-10-CM | POA: Diagnosis not present

## 2015-06-15 DIAGNOSIS — F17210 Nicotine dependence, cigarettes, uncomplicated: Secondary | ICD-10-CM | POA: Insufficient documentation

## 2015-06-15 HISTORY — PX: COLONOSCOPY: SHX5424

## 2015-06-15 SURGERY — COLONOSCOPY
Anesthesia: Moderate Sedation

## 2015-06-15 MED ORDER — MIDAZOLAM HCL 5 MG/5ML IJ SOLN
INTRAMUSCULAR | Status: DC | PRN
  Administered 2015-06-15 (×2): 1 mg via INTRAVENOUS
  Administered 2015-06-15: 2 mg via INTRAVENOUS
  Administered 2015-06-15: 1 mg via INTRAVENOUS

## 2015-06-15 MED ORDER — STERILE WATER FOR IRRIGATION IR SOLN
Status: DC | PRN
  Administered 2015-06-15: 08:00:00

## 2015-06-15 MED ORDER — SODIUM CHLORIDE 0.9 % IV SOLN
INTRAVENOUS | Status: DC
  Administered 2015-06-15: 07:00:00 via INTRAVENOUS

## 2015-06-15 MED ORDER — MIDAZOLAM HCL 5 MG/5ML IJ SOLN
INTRAMUSCULAR | Status: AC
  Filled 2015-06-15: qty 10

## 2015-06-15 MED ORDER — MEPERIDINE HCL 50 MG/ML IJ SOLN
INTRAMUSCULAR | Status: DC | PRN
  Administered 2015-06-15 (×2): 25 mg via INTRAVENOUS

## 2015-06-15 MED ORDER — MEPERIDINE HCL 50 MG/ML IJ SOLN
INTRAMUSCULAR | Status: AC
  Filled 2015-06-15: qty 1

## 2015-06-15 NOTE — Op Note (Signed)
COLONOSCOPY PROCEDURE REPORT  PATIENT:  Joanna Farmer  MR#:  [id number] Birthdate:  03-Oct-1955, 60 y.o., female Endoscopist:  Dr. Rogene Houston, MD Referred By:  Dr. Rosita Fire, MD Procedure Date: 06/15/2015  Procedure:   Colonoscopy with snare polypectomy.  Indications:  Patient is 60 year old African-American female was undergoing average risk screening colonoscopy.  Informed Consent:  The procedure and risks were reviewed with the patient and informed consent was obtained.  Medications:  Demerol 50 mg IV Versed 5 mg IV  First dose administered at 0734 Last dose administered at  0753  Description of procedure:  After a digital rectal exam was performed, that colonoscope was advanced from the anus through the rectum and colon to the area of the cecum, ileocecal valve and appendiceal orifice. The cecum was deeply intubated. These structures were well-seen and photographed for the record. From the level of the cecum and ileocecal valve, the scope was slowly and cautiously withdrawn. The mucosal surfaces were carefully surveyed utilizing scope tip to flexion to facilitate fold flattening as needed. The scope was pulled down into the rectum where a thorough exam including retroflexion was performed.  Findings:   Prep excellent. 5 mm polyp cold snared from mid transverse colon. 7 mm polyp cold snared from proximal sigmoid colon. 6 polyps cold snared from distal sigmoid colon. These polyps ranged in size from 4 to 8 mm. Single 360 clip applied to polypectomy site. Normal rectal mucosa and anal rectal junction. .  Therapeutic/Diagnostic Maneuvers Performed:  See above  Complications:  None  EBL:  Minimal  Cecal Withdrawal Time:  23 minutes  Impression:  Examination performed to cecum. Two small polyps cold snared and submitted together(transverse and proximal sigmoid colon). Six polyps cold snared from distal sigmoid colon and submitted together. Largest polyp was 8 mm. Single  360 applied to polypectomy site.  Recommendations:  Standard instructions given. Resume clopidogrel on 06/18/2015 I will contact patient with biopsy results and further recommendations.  REHMAN,NAJEEB U  06/15/2015 8:20 AM  CC: Dr. Rosita Fire, MD & Dr. Rayne Du ref. provider found

## 2015-06-15 NOTE — Discharge Instructions (Signed)
Resume clopidogrel on 06/18/2015. Resume other medications as before. Resume usual diet No driving for 24 hours. Physician will call with biopsy results.    Colonoscopy, Care After Refer to this sheet in the next few weeks. These instructions provide you with information on caring for yourself after your procedure. Your health care provider may also give you more specific instructions. Your treatment has been planned according to current medical practices, but problems sometimes occur. Call your health care provider if you have any problems or questions after your procedure. WHAT TO EXPECT AFTER THE PROCEDURE  After your procedure, it is typical to have the following:  A small amount of blood in your stool.  Moderate amounts of gas and mild abdominal cramping or bloating. HOME CARE INSTRUCTIONS  Do not drive, operate machinery, or sign important documents for 24 hours.  You may shower and resume your regular physical activities, but move at a slower pace for the first 24 hours.  Take frequent rest periods for the first 24 hours.  Walk around or put a warm pack on your abdomen to help reduce abdominal cramping and bloating.  Drink enough fluids to keep your urine clear or pale yellow.  You may resume your normal diet as instructed by your health care provider. Avoid heavy or fried foods that are hard to digest.  Avoid drinking alcohol for 24 hours or as instructed by your health care provider.  Only take over-the-counter or prescription medicines as directed by your health care provider.  If a tissue sample (biopsy) was taken during your procedure:  Do not take aspirin or blood thinners for 7 days, or as instructed by your health care provider.  Do not drink alcohol for 7 days, or as instructed by your health care provider.  Eat soft foods for the first 24 hours. SEEK MEDICAL CARE IF: You have persistent spotting of blood in your stool 2-3 days after the procedure. SEEK  IMMEDIATE MEDICAL CARE IF:  You have more than a small spotting of blood in your stool.  You pass large blood clots in your stool.  Your abdomen is swollen (distended).  You have nausea or vomiting.  You have a fever.  You have increasing abdominal pain that is not relieved with medicine.   This information is not intended to replace advice given to you by your health care provider. Make sure you discuss any questions you have with your health care provider.   Document Released: 11/15/2003 Document Revised: 01/21/2013 Document Reviewed: 12/08/2012 Elsevier Interactive Patient Education [employer].

## 2015-06-15 NOTE — H&P (Signed)
Joanna Farmer is an 60 y.o. female.   Chief Complaint: Patient is here for colonoscopy. HPI: Patient is 60 year old African-American female was here for screening colonoscopy. She denies abdominal pain change in bowel habits or rectal bleeding. This is patient's first exam. She has history of ischemic cardiomyopathy. She denies shortness of breath or chest pain. Issue and has been off clopidogrel for 5 days. Family history is negative for CRC.  Past Medical History  Diagnosis Date  . Essential hypertension   . Depression   . Hyperlipidemia with target LDL less than 70   . Alcohol abuse   . History of cocaine use   . Herpes simplex virus (HSV) infection 2016    Antibodies  . GERD (gastroesophageal reflux disease)   . Pneumonia 01/2015  . HSV (herpes simplex virus) infection     hx/notes 03/02/2015  . CAD S/P RCA (in 2015 then 03/16/2015) 03/17/2015    a. Reportedly 2 stents placed [place], [name]; cath 03/16/2015 Mid RCA to Dist RCA lesion, 75%-90% stenosed. Moderate to severe disease in the proximal to mid RCA. Post intervention with overlapping drug-eluting stents, 2.5 x 38 mmSynergy and 2.75 x 94mm Synergy- postdilated to 3.3 mm, there is a 0% residual stenosis.   [name] History of MI (myocardial infarction)     "they say I've had one sometime down the line" (03/16/2015)  . Cardiomyopathy, ischemic - suggest prior Anterior & Inferio MI     Echo 123XX123: Systolic function was severely reduced. EF~ 20%. ~Grade 1 diastolic dysfunction. Severe HK of entire anterior myocardium; Mod HK of the basal-mid anteroseptal, mid inferolateral, and mid anterolateral myocardium; mild HK of the mid inferoseptal and basal inferolateral myocardium.     Past Surgical History  Procedure Laterality Date  . Tubal ligation  1983  . Cardiac catheterization N/A 03/16/2015    Procedure: Right/Left Heart Cath and Coronary Angiography;  Surgeon: Jettie Booze, MD;  Location: [facility];  Service:  Cardiovascular;  Laterality: N/A;  . Cardiac catheterization Right 03/16/2015    Procedure: Coronary Stent Intervention;  Surgeon: Jettie Booze, MD;  Location: [facility];  Service: Cardiovascular;  Laterality: Right;  . Coronary angioplasty with stent placement  2015    `2 stents"    Family History  Problem Relation Age of Onset  . Breast cancer Sister   . Hypertension Brother    Social History:  reports that she has been smoking Cigarettes.  She has a 3.84 pack-year smoking history. She has never used smokeless tobacco. She reports that she drinks about 11.4 oz of alcohol per week. She reports that she uses illicit drugs (Marijuana and Cocaine).  Allergies: No Known Allergies  Medications Prior to Admission  Medication Sig Dispense Refill  . aspirin 81 MG tablet Take 81 mg by mouth daily.    [name] atorvastatin (LIPITOR) 80 MG tablet Take 1 tablet (80 mg total) by mouth daily at 6 PM. 30 tablet 11  . carvedilol (COREG) 6.25 MG tablet Take 1 tablet (6.25 mg total) by mouth 2 (two) times daily. 180 tablet 3  . hydrochlorothiazide (HYDRODIURIL) 12.5 MG tablet Take 1 tablet (12.5 mg total) by mouth daily. 30 tablet 0  . losartan (COZAAR) 50 MG tablet Take 1 tablet (50 mg total) by mouth daily. 90 tablet 3  . potassium chloride 20 MEQ TBCR Take 20 mEq by mouth daily. 30 tablet 3  . clopidogrel (PLAVIX) 75 MG tablet Take 1 tablet (75 mg total) by mouth  daily. 30 tablet 11  . nitroGLYCERIN (NITROSTAT) 0.4 MG SL tablet Place 1 tablet (0.4 mg total) under the tongue every 5 (five) minutes as needed for chest pain. 25 tablet 3    No results found for this or any previous visit (from the past 48 hour(s)). No results found.  ROS  Blood pressure 166/70, pulse 71, temperature 98.9 F (37.2 C), temperature source Oral, resp. rate 14, height 4\' 11"  (1.499 m), weight 105 lb (47.628 kg), SpO2 98 %. Physical Exam  Constitutional: She appears well-developed and well-nourished.  HENT:   Mouth/Throat: Oropharynx is clear and moist.  Eyes: Conjunctivae are normal. No scleral icterus.  Neck: No thyromegaly present.  Cardiovascular: Normal rate, regular rhythm and normal heart sounds.   No murmur heard. Respiratory: Effort normal and breath sounds normal.  GI: Soft. She exhibits no distension and no mass. There is no tenderness.  Musculoskeletal: She exhibits no edema.  Lymphadenopathy:    She has no cervical adenopathy.  Neurological: She is alert.  Skin: Skin is warm and dry.     Assessment/Plan Average risk screening colonoscopy.  Rogene Houston, MD 06/15/2015, 7:28 AM

## 2015-06-20 ENCOUNTER — Encounter (HOSPITAL_COMMUNITY): Payer: Self-pay | Admitting: Internal Medicine

## 2015-06-21 ENCOUNTER — Encounter (HOSPITAL_COMMUNITY): Admission: RE | Admit: 2015-06-21 | Payer: Medicaid Other | Source: Ambulatory Visit

## 2015-09-29 ENCOUNTER — Inpatient Hospital Stay (HOSPITAL_COMMUNITY): Payer: Medicaid Other

## 2015-09-29 ENCOUNTER — Inpatient Hospital Stay (HOSPITAL_COMMUNITY)
Admission: EM | Admit: 2015-09-29 | Discharge: 2015-11-10 | DRG: 003 | Disposition: A | Discharge status: Other Acute Inpatient Hospital | Payer: Medicaid Other | Attending: Internal Medicine | Admitting: Internal Medicine

## 2015-09-29 ENCOUNTER — Encounter (HOSPITAL_COMMUNITY): Payer: Self-pay | Admitting: Emergency Medicine

## 2015-09-29 ENCOUNTER — Emergency Department (HOSPITAL_COMMUNITY): Payer: Medicaid Other

## 2015-09-29 DIAGNOSIS — R4020 Unspecified coma: Secondary | ICD-10-CM | POA: Diagnosis present

## 2015-09-29 DIAGNOSIS — I615 Nontraumatic intracerebral hemorrhage, intraventricular: Secondary | ICD-10-CM | POA: Diagnosis not present

## 2015-09-29 DIAGNOSIS — Z430 Encounter for attention to tracheostomy: Secondary | ICD-10-CM

## 2015-09-29 DIAGNOSIS — R402112 Coma scale, eyes open, never, at arrival to emergency department: Secondary | ICD-10-CM | POA: Diagnosis present

## 2015-09-29 DIAGNOSIS — N390 Urinary tract infection, site not specified: Secondary | ICD-10-CM | POA: Diagnosis present

## 2015-09-29 DIAGNOSIS — I619 Nontraumatic intracerebral hemorrhage, unspecified: Secondary | ICD-10-CM

## 2015-09-29 DIAGNOSIS — I110 Hypertensive heart disease with heart failure: Secondary | ICD-10-CM | POA: Diagnosis present

## 2015-09-29 DIAGNOSIS — F1010 Alcohol abuse, uncomplicated: Secondary | ICD-10-CM | POA: Diagnosis not present

## 2015-09-29 DIAGNOSIS — Y92009 Unspecified place in unspecified non-institutional (private) residence as the place of occurrence of the external cause: Secondary | ICD-10-CM | POA: Diagnosis not present

## 2015-09-29 DIAGNOSIS — I161 Hypertensive emergency: Secondary | ICD-10-CM | POA: Diagnosis not present

## 2015-09-29 DIAGNOSIS — Z9289 Personal history of other medical treatment: Secondary | ICD-10-CM

## 2015-09-29 DIAGNOSIS — Z955 Presence of coronary angioplasty implant and graft: Secondary | ICD-10-CM

## 2015-09-29 DIAGNOSIS — B9689 Other specified bacterial agents as the cause of diseases classified elsewhere: Secondary | ICD-10-CM | POA: Diagnosis present

## 2015-09-29 DIAGNOSIS — E876 Hypokalemia: Secondary | ICD-10-CM | POA: Diagnosis not present

## 2015-09-29 DIAGNOSIS — Z982 Presence of cerebrospinal fluid drainage device: Secondary | ICD-10-CM

## 2015-09-29 DIAGNOSIS — F1410 Cocaine abuse, uncomplicated: Secondary | ICD-10-CM | POA: Diagnosis present

## 2015-09-29 DIAGNOSIS — G9340 Encephalopathy, unspecified: Secondary | ICD-10-CM | POA: Diagnosis not present

## 2015-09-29 DIAGNOSIS — G8194 Hemiplegia, unspecified affecting left nondominant side: Secondary | ICD-10-CM | POA: Diagnosis present

## 2015-09-29 DIAGNOSIS — R339 Retention of urine, unspecified: Secondary | ICD-10-CM | POA: Diagnosis not present

## 2015-09-29 DIAGNOSIS — K219 Gastro-esophageal reflux disease without esophagitis: Secondary | ICD-10-CM | POA: Diagnosis present

## 2015-09-29 DIAGNOSIS — I612 Nontraumatic intracerebral hemorrhage in hemisphere, unspecified: Secondary | ICD-10-CM | POA: Diagnosis not present

## 2015-09-29 DIAGNOSIS — I610 Nontraumatic intracerebral hemorrhage in hemisphere, subcortical: Secondary | ICD-10-CM | POA: Diagnosis not present

## 2015-09-29 DIAGNOSIS — Z8619 Personal history of other infectious and parasitic diseases: Secondary | ICD-10-CM

## 2015-09-29 DIAGNOSIS — G911 Obstructive hydrocephalus: Secondary | ICD-10-CM | POA: Diagnosis present

## 2015-09-29 DIAGNOSIS — G919 Hydrocephalus, unspecified: Secondary | ICD-10-CM

## 2015-09-29 DIAGNOSIS — R2990 Unspecified symptoms and signs involving the nervous system: Secondary | ICD-10-CM | POA: Diagnosis not present

## 2015-09-29 DIAGNOSIS — I5042 Chronic combined systolic (congestive) and diastolic (congestive) heart failure: Secondary | ICD-10-CM | POA: Diagnosis present

## 2015-09-29 DIAGNOSIS — Y95 Nosocomial condition: Secondary | ICD-10-CM | POA: Diagnosis not present

## 2015-09-29 DIAGNOSIS — Z781 Physical restraint status: Secondary | ICD-10-CM

## 2015-09-29 DIAGNOSIS — J9601 Acute respiratory failure with hypoxia: Secondary | ICD-10-CM | POA: Diagnosis present

## 2015-09-29 DIAGNOSIS — Z930 Tracheostomy status: Secondary | ICD-10-CM | POA: Diagnosis not present

## 2015-09-29 DIAGNOSIS — I6521 Occlusion and stenosis of right carotid artery: Secondary | ICD-10-CM | POA: Diagnosis present

## 2015-09-29 DIAGNOSIS — E872 Acidosis: Secondary | ICD-10-CM | POA: Diagnosis present

## 2015-09-29 DIAGNOSIS — I255 Ischemic cardiomyopathy: Secondary | ICD-10-CM | POA: Diagnosis present

## 2015-09-29 DIAGNOSIS — Z4659 Encounter for fitting and adjustment of other gastrointestinal appliance and device: Secondary | ICD-10-CM

## 2015-09-29 DIAGNOSIS — R402212 Coma scale, best verbal response, none, at arrival to emergency department: Secondary | ICD-10-CM | POA: Diagnosis not present

## 2015-09-29 DIAGNOSIS — G935 Compression of brain: Secondary | ICD-10-CM | POA: Diagnosis not present

## 2015-09-29 DIAGNOSIS — J189 Pneumonia, unspecified organism: Secondary | ICD-10-CM | POA: Diagnosis not present

## 2015-09-29 DIAGNOSIS — F329 Major depressive disorder, single episode, unspecified: Secondary | ICD-10-CM | POA: Diagnosis present

## 2015-09-29 DIAGNOSIS — I252 Old myocardial infarction: Secondary | ICD-10-CM

## 2015-09-29 DIAGNOSIS — I6789 Other cerebrovascular disease: Secondary | ICD-10-CM | POA: Diagnosis not present

## 2015-09-29 DIAGNOSIS — E870 Hyperosmolality and hypernatremia: Secondary | ICD-10-CM | POA: Diagnosis not present

## 2015-09-29 DIAGNOSIS — I10 Essential (primary) hypertension: Secondary | ICD-10-CM | POA: Diagnosis not present

## 2015-09-29 DIAGNOSIS — J9600 Acute respiratory failure, unspecified whether with hypoxia or hypercapnia: Secondary | ICD-10-CM | POA: Diagnosis not present

## 2015-09-29 DIAGNOSIS — R569 Unspecified convulsions: Secondary | ICD-10-CM | POA: Diagnosis not present

## 2015-09-29 DIAGNOSIS — R509 Fever, unspecified: Secondary | ICD-10-CM | POA: Diagnosis not present

## 2015-09-29 DIAGNOSIS — Z7189 Other specified counseling: Secondary | ICD-10-CM

## 2015-09-29 DIAGNOSIS — F17210 Nicotine dependence, cigarettes, uncomplicated: Secondary | ICD-10-CM | POA: Diagnosis present

## 2015-09-29 DIAGNOSIS — R197 Diarrhea, unspecified: Secondary | ICD-10-CM | POA: Diagnosis not present

## 2015-09-29 DIAGNOSIS — G936 Cerebral edema: Secondary | ICD-10-CM | POA: Diagnosis present

## 2015-09-29 DIAGNOSIS — I2510 Atherosclerotic heart disease of native coronary artery without angina pectoris: Secondary | ICD-10-CM | POA: Diagnosis not present

## 2015-09-29 DIAGNOSIS — J9690 Respiratory failure, unspecified, unspecified whether with hypoxia or hypercapnia: Secondary | ICD-10-CM

## 2015-09-29 DIAGNOSIS — R739 Hyperglycemia, unspecified: Secondary | ICD-10-CM | POA: Diagnosis not present

## 2015-09-29 DIAGNOSIS — Z462 Encounter for fitting and adjustment of other devices related to nervous system and special senses: Secondary | ICD-10-CM

## 2015-09-29 DIAGNOSIS — E871 Hypo-osmolality and hyponatremia: Secondary | ICD-10-CM | POA: Diagnosis not present

## 2015-09-29 DIAGNOSIS — E785 Hyperlipidemia, unspecified: Secondary | ICD-10-CM | POA: Diagnosis present

## 2015-09-29 DIAGNOSIS — I613 Nontraumatic intracerebral hemorrhage in brain stem: Secondary | ICD-10-CM

## 2015-09-29 DIAGNOSIS — F1910 Other psychoactive substance abuse, uncomplicated: Secondary | ICD-10-CM

## 2015-09-29 DIAGNOSIS — R4182 Altered mental status, unspecified: Secondary | ICD-10-CM | POA: Diagnosis present

## 2015-09-29 DIAGNOSIS — Z978 Presence of other specified devices: Secondary | ICD-10-CM

## 2015-09-29 DIAGNOSIS — Z7982 Long term (current) use of aspirin: Secondary | ICD-10-CM

## 2015-09-29 DIAGNOSIS — Z8249 Family history of ischemic heart disease and other diseases of the circulatory system: Secondary | ICD-10-CM

## 2015-09-29 DIAGNOSIS — H5702 Anisocoria: Secondary | ICD-10-CM | POA: Diagnosis present

## 2015-09-29 DIAGNOSIS — W19XXXA Unspecified fall, initial encounter: Secondary | ICD-10-CM | POA: Diagnosis not present

## 2015-09-29 DIAGNOSIS — I618 Other nontraumatic intracerebral hemorrhage: Secondary | ICD-10-CM | POA: Diagnosis present

## 2015-09-29 DIAGNOSIS — R402322 Coma scale, best motor response, extension, at arrival to emergency department: Secondary | ICD-10-CM | POA: Diagnosis present

## 2015-09-29 DIAGNOSIS — Z09 Encounter for follow-up examination after completed treatment for conditions other than malignant neoplasm: Secondary | ICD-10-CM

## 2015-09-29 DIAGNOSIS — D6489 Other specified anemias: Secondary | ICD-10-CM | POA: Diagnosis not present

## 2015-09-29 DIAGNOSIS — R29724 NIHSS score 24: Secondary | ICD-10-CM | POA: Diagnosis present

## 2015-09-29 DIAGNOSIS — Z7902 Long term (current) use of antithrombotics/antiplatelets: Secondary | ICD-10-CM

## 2015-09-29 LAB — BLOOD GAS, ARTERIAL
Acid-base deficit: 5.2 mmol/L — ABNORMAL HIGH (ref 0.0–2.0)
Bicarbonate: 20.8 mEq/L (ref 20.0–24.0)
Drawn by: 23534
FIO2: 0.5
O2 Content: 50 L/min
O2 SAT: 98.3 %
PEEP: 5 cmH2O
PH ART: 7.438 (ref 7.350–7.450)
PO2 ART: 128 mmHg — AB (ref 80.0–100.0)
RATE: 14 resp/min
VT: 500 mL
pCO2 arterial: 27.3 mmHg — ABNORMAL LOW (ref 35.0–45.0)

## 2015-09-29 LAB — CBC WITH DIFFERENTIAL/PLATELET
Basophils Absolute: 0 10*3/uL (ref 0.0–0.1)
Basophils Relative: 0 %
EOS PCT: 0 %
Eosinophils Absolute: 0 10*3/uL (ref 0.0–0.7)
HEMATOCRIT: 39.1 % (ref 36.0–46.0)
Hemoglobin: 13.5 g/dL (ref 12.0–15.0)
LYMPHS ABS: 1.1 10*3/uL (ref 0.7–4.0)
LYMPHS PCT: 16 %
MCH: 33.3 pg (ref 26.0–34.0)
MCHC: 34.5 g/dL (ref 30.0–36.0)
MCV: 96.5 fL (ref 78.0–100.0)
MONO ABS: 0.2 10*3/uL (ref 0.1–1.0)
Monocytes Relative: 2 %
Neutro Abs: 6.1 10*3/uL (ref 1.7–7.7)
Neutrophils Relative %: 82 %
PLATELETS: 211 10*3/uL (ref 150–400)
RBC: 4.05 MIL/uL (ref 3.87–5.11)
RDW: 13.3 % (ref 11.5–15.5)
WBC: 7.4 10*3/uL (ref 4.0–10.5)

## 2015-09-29 LAB — COMPREHENSIVE METABOLIC PANEL
ALT: 40 U/L (ref 14–54)
AST: 47 U/L — ABNORMAL HIGH (ref 15–41)
Albumin: 4.3 g/dL (ref 3.5–5.0)
Alkaline Phosphatase: 65 U/L (ref 38–126)
Anion gap: 10 (ref 5–15)
BILIRUBIN TOTAL: 1.2 mg/dL (ref 0.3–1.2)
BUN: 16 mg/dL (ref 6–20)
CALCIUM: 9.2 mg/dL (ref 8.9–10.3)
CHLORIDE: 105 mmol/L (ref 101–111)
CO2: 20 mmol/L — ABNORMAL LOW (ref 22–32)
CREATININE: 0.92 mg/dL (ref 0.44–1.00)
Glucose, Bld: 102 mg/dL — ABNORMAL HIGH (ref 65–99)
Potassium: 4 mmol/L (ref 3.5–5.1)
Sodium: 135 mmol/L (ref 135–145)
TOTAL PROTEIN: 8.9 g/dL — AB (ref 6.5–8.1)

## 2015-09-29 LAB — RAPID URINE DRUG SCREEN, HOSP PERFORMED
Amphetamines: NOT DETECTED
BARBITURATES: NOT DETECTED
Benzodiazepines: NOT DETECTED
Cocaine: POSITIVE — AB
Opiates: NOT DETECTED
Tetrahydrocannabinol: NOT DETECTED

## 2015-09-29 LAB — URINALYSIS, ROUTINE W REFLEX MICROSCOPIC
BILIRUBIN URINE: NEGATIVE
GLUCOSE, UA: NEGATIVE mg/dL
KETONES UR: 15 mg/dL — AB
Nitrite: POSITIVE — AB
PH: 6 (ref 5.0–8.0)
PROTEIN: 30 mg/dL — AB
Specific Gravity, Urine: 1.025 (ref 1.005–1.030)

## 2015-09-29 LAB — I-STAT CHEM 8, ED
BUN: 14 mg/dL (ref 6–20)
CALCIUM ION: 1.17 mmol/L (ref 1.13–1.30)
CHLORIDE: 107 mmol/L (ref 101–111)
Creatinine, Ser: 0.9 mg/dL (ref 0.44–1.00)
GLUCOSE: 99 mg/dL (ref 65–99)
HCT: 42 % (ref 36.0–46.0)
Hemoglobin: 14.3 g/dL (ref 12.0–15.0)
POTASSIUM: 4 mmol/L (ref 3.5–5.1)
Sodium: 140 mmol/L (ref 135–145)
TCO2: 21 mmol/L (ref 0–100)

## 2015-09-29 LAB — LACTIC ACID, PLASMA: Lactic Acid, Venous: 1.3 mmol/L (ref 0.5–2.0)

## 2015-09-29 LAB — TROPONIN I: Troponin I: 0.04 ng/mL — ABNORMAL HIGH (ref <0.031)

## 2015-09-29 LAB — I-STAT BETA HCG BLOOD, ED (MC, WL, AP ONLY)

## 2015-09-29 LAB — SALICYLATE LEVEL

## 2015-09-29 LAB — CBG MONITORING, ED: Glucose-Capillary: 104 mg/dL — ABNORMAL HIGH (ref 65–99)

## 2015-09-29 LAB — URINE MICROSCOPIC-ADD ON

## 2015-09-29 LAB — CK: CK TOTAL: 88 U/L (ref 38–234)

## 2015-09-29 LAB — ETHANOL

## 2015-09-29 LAB — I-STAT CG4 LACTIC ACID, ED: LACTIC ACID, VENOUS: 1.41 mmol/L (ref 0.5–2.0)

## 2015-09-29 LAB — ACETAMINOPHEN LEVEL

## 2015-09-29 LAB — PROTIME-INR
INR: 0.96 (ref 0.00–1.49)
Prothrombin Time: 13 seconds (ref 11.6–15.2)

## 2015-09-29 MED ORDER — ACETAMINOPHEN 650 MG RE SUPP
650.0000 mg | Freq: Once | RECTAL | Status: AC
  Administered 2015-09-29: 650 mg via RECTAL
  Filled 2015-09-29: qty 1

## 2015-09-29 MED ORDER — ETOMIDATE 2 MG/ML IV SOLN
20.0000 mg | Freq: Once | INTRAVENOUS | Status: AC
  Administered 2015-09-29: 20 mg via INTRAVENOUS

## 2015-09-29 MED ORDER — DEXTROSE 5 % IV SOLN
1.0000 g | Freq: Once | INTRAVENOUS | Status: AC
  Administered 2015-09-29: 1 g via INTRAVENOUS
  Filled 2015-09-29: qty 10

## 2015-09-29 MED ORDER — NICARDIPINE HCL IN NACL 20-0.86 MG/200ML-% IV SOLN
3.0000 mg/h | Freq: Once | INTRAVENOUS | Status: AC
  Administered 2015-09-29: 75 mg via INTRAVENOUS
  Administered 2015-09-29: 5 mg/h via INTRAVENOUS
  Administered 2015-09-29: 3 mg/h via INTRAVENOUS
  Filled 2015-09-29: qty 200

## 2015-09-29 MED ORDER — MIDAZOLAM HCL 2 MG/2ML IJ SOLN
2.0000 mg | Freq: Once | INTRAMUSCULAR | Status: AC
  Administered 2015-09-29: 2 mg via INTRAVENOUS
  Filled 2015-09-29: qty 2

## 2015-09-29 MED ORDER — NICARDIPINE HCL IN NACL 20-0.86 MG/200ML-% IV SOLN
INTRAVENOUS | Status: AC
  Administered 2015-09-29: 75 mg via INTRAVENOUS
  Filled 2015-09-29: qty 200

## 2015-09-29 MED ORDER — NALOXONE HCL 0.4 MG/ML IJ SOLN
0.4000 mg | Freq: Once | INTRAMUSCULAR | Status: AC
  Administered 2015-09-29: 0.4 mg via INTRAVENOUS

## 2015-09-29 MED ORDER — SODIUM CHLORIDE 0.9 % IV BOLUS (SEPSIS)
1000.0000 mL | Freq: Once | INTRAVENOUS | Status: AC
  Administered 2015-09-29: 1000 mL via INTRAVENOUS

## 2015-09-29 MED ORDER — SUCCINYLCHOLINE CHLORIDE 20 MG/ML IJ SOLN
100.0000 mg | Freq: Once | INTRAMUSCULAR | Status: AC
  Administered 2015-09-29: 100 mg via INTRAVENOUS

## 2015-09-29 MED ORDER — MIDAZOLAM HCL 2 MG/2ML IJ SOLN
INTRAMUSCULAR | Status: AC
  Filled 2015-09-29: qty 2

## 2015-09-29 MED ORDER — MIDAZOLAM HCL 2 MG/2ML IJ SOLN
2.0000 mg | Freq: Once | INTRAMUSCULAR | Status: AC
  Administered 2015-09-29: 2 mg via INTRAVENOUS

## 2015-09-29 MED ORDER — NICARDIPINE HCL IN NACL 20-0.86 MG/200ML-% IV SOLN
INTRAVENOUS | Status: AC
  Filled 2015-09-29: qty 200

## 2015-09-29 MED ORDER — PROPOFOL 1000 MG/100ML IV EMUL
5.0000 ug/kg/min | INTRAVENOUS | Status: DC
  Administered 2015-09-29: 20 ug/kg/min via INTRAVENOUS
  Filled 2015-09-29 (×2): qty 100

## 2015-09-29 MED ORDER — NALOXONE HCL 0.4 MG/ML IJ SOLN
INTRAMUSCULAR | Status: AC
  Filled 2015-09-29: qty 1

## 2015-09-29 NOTE — ED Notes (Signed)
EMS reports family on scene reports pt fell and hit her head last night and has been sleeping since her fall. CBG 104 on arrival. PT only responsive to painful stimuli and pupils pin point and head fixated to the left.

## 2015-09-29 NOTE — H&P (Signed)
PULMONARY / CRITICAL CARE MEDICINE   Name: Joanna Farmer MRN: [id number] DOB: 05-17-1955    ADMISSION DATE:  09/29/2015 CONSULTATION DATE:  09/29/2015  REFERRING MD:  Ezequiel Essex, M.D. / [facility]  CHIEF COMPLAINT:  Altered mental status  HISTORY OF PRESENT ILLNESS:   60 year old female past medical history as below, which is significant for hypertension, alcohol abuse, cocaine abuse, HSV, coronary artery disease status post stents to RCA and LAD in 2015, and ischemic cardiomyopathy with LV EF 35-40%. She presented to [facility] emergency department 6/15 with altered mental status. Reportedly the night before she suffered a fall during the night, and since that time has been sleeping all day. She was brought to the emergency department with this complaint. In emergency department she was unresponsive but reportedly breathing adequately. She had no spontaneous movement and left gaze deviation, but did have a positive gag reflex. CT scan of the head was obtained and demonstrated acute intracranial hemorrhage with intraventricular extension. Cervical spine was without radiographic evidence of injury. She was intubated for airway protection with anticipation of transfer to [facility]. PCCM accepting.  PAST MEDICAL HISTORY :  Past Medical History  Diagnosis Date  . Essential hypertension   . Depression   . Hyperlipidemia with target LDL less than 70   . Alcohol abuse   . History of cocaine use   . Herpes simplex virus (HSV) infection 2016    Antibodies  . GERD (gastroesophageal reflux disease)   . Pneumonia 01/2015  . HSV (herpes simplex virus) infection     hx/notes 03/02/2015  . CAD S/P RCA (in 2015 then 03/16/2015) 03/17/2015    a. Reportedly 2 stents placed [place], [name]; cath 03/16/2015 Mid RCA to Dist RCA lesion, 75%-90% stenosed. Moderate to severe disease in the proximal to mid RCA. Post intervention with overlapping drug-eluting stents, 2.5 x 38 mmSynergy and 2.75 x  68mm Synergy- postdilated to 3.3 mm, there is a 0% residual stenosis.   [name] History of MI (myocardial infarction)     "they say I've had one sometime down the line" (03/16/2015)  . Cardiomyopathy, ischemic - suggest prior Anterior & Inferio MI     Echo [id number]: Systolic function was severely reduced. EF~ 20%. ~Grade 1 diastolic dysfunction. Severe HK of entire anterior myocardium; Mod HK of the basal-mid anteroseptal, mid inferolateral, and mid anterolateral myocardium; mild HK of the mid inferoseptal and basal inferolateral myocardium.     PAST SURGICAL HISTORY: Past Surgical History  Procedure Laterality Date  . Tubal ligation  1983  . Cardiac catheterization N/A 03/16/2015    Procedure: Right/Left Heart Cath and Coronary Angiography;  Surgeon: Jettie Booze, MD;  Location: [facility];  Service: Cardiovascular;  Laterality: N/A;  . Cardiac catheterization Right 03/16/2015    Procedure: Coronary Stent Intervention;  Surgeon: Jettie Booze, MD;  Location: [facility];  Service: Cardiovascular;  Laterality: Right;  . Coronary angioplasty with stent placement  2015    `2 stents"  . Colonoscopy N/A 06/15/2015    Procedure: Colonoscopy ;  Surgeon: Rogene Houston, MD;  Location: [facility];  Service: Endoscopy;  Laterality: N/A;  930-moved to 06/15/15 @ 7:30 - Ann notified pt    No Known Allergies  No current facility-administered medications on file prior to encounter.   Current Outpatient Prescriptions on File Prior to Encounter  Medication Sig  . atorvastatin (LIPITOR) 80 MG tablet Take 1 tablet (80 mg total) by mouth daily at  6 PM.  . losartan (COZAAR) 50 MG tablet Take 1 tablet (50 mg total) by mouth daily.  . potassium chloride 20 MEQ TBCR Take 20 mEq by mouth daily.  [name] aspirin 81 MG tablet Take 81 mg by mouth daily.  . carvedilol (COREG) 6.25 MG tablet Take 1 tablet (6.25 mg total) by mouth 2 (two) times daily.  . clopidogrel (PLAVIX) 75 MG tablet Take 1  tablet (75 mg total) by mouth daily.  . hydrochlorothiazide (HYDRODIURIL) 12.5 MG tablet Take 1 tablet (12.5 mg total) by mouth daily.  . nitroGLYCERIN (NITROSTAT) 0.4 MG SL tablet Place 1 tablet (0.4 mg total) under the tongue every 5 (five) minutes as needed for chest pain.    FAMILY HISTORY:  Family History  Problem Relation Age of Onset  . Breast cancer Sister   . Hypertension Brother     SOCIAL HISTORY: Social History   Social History  . Marital Status: Legally Separated    Spouse Name: N/A  . Number of Children: N/A  . Years of Education: N/A   Social History Main Topics  . Smoking status: Current Every Day Smoker -- 0.12 packs/day for 32 years    Types: Cigarettes  . Smokeless tobacco: Never Used  . Alcohol Use: 11.4 oz/week    0 Standard drinks or equivalent, 19 Cans of beer per week     Comment: 03/16/2015 "2, 16oz beers/day"  . Drug Use: Yes    Special: Marijuana, Cocaine     Comment: 03/16/2015 "it's been months since I've used either"  . Sexual Activity: Not Currently    Birth Control/ Protection: Post-menopausal   Other Topics Concern  . None   Social History Narrative    REVIEW OF SYSTEMS:  Unable secondary to intubation.  SUBJECTIVE: As above.  VITAL SIGNS: BP 175/80 mmHg  Pulse 88  Temp(Src) 100 F (37.8 C) (Rectal)  Resp 14  Ht 5\' 3"  (1.6 m)  Wt 47.628 kg (105 lb)  BMI 18.60 kg/m2  SpO2 100%  HEMODYNAMICS:    VENTILATOR SETTINGS: Vent Mode:  [-] PRVC FiO2 (%):  [50 %] 50 % Set Rate:  [14 bmp] 14 bmp Vt Set:  [500 mL] 500 mL PEEP:  [5 cmH20] 5 cmH20 Plateau Pressure:  [17 cmH20] 17 cmH20  INTAKE / OUTPUT:    PHYSICAL EXAMINATION: General:  Sedated. No acute distress. No family at bedside.  Integument:  Warm & dry. No rash on exposed skin.  Lymphatics:  No appreciated cervical or supraclavicular lymphadenoapthy. HEENT:  No scleral injection or icterus. Endotracheal tube in place.  Cardiovascular:  Regular rate. No edema. No  appreciable JVD.  Pulmonary:  Good aeration & clear to auscultation bilaterally. Symmetric chest wall rise on ventilator. Abdomen: Soft. Normal bowel sounds. Nondistended.  Musculoskeletal:  Normal bulk. No joint deformity or effusion appreciated. Neurological:  Localizing to pain in right upper extremity. Pupils pinpoint & symmetric. Pathologic reflexes bilateral lower extremities & left upper extremity. Psychiatric:  Unable to assess given intubation.  LABS:  BMET  Recent Labs Lab 09/29/15 1500 09/29/15 1506  NA 135 140  K 4.0 4.0  CL 105 107  CO2 20*  --   BUN 16 14  CREATININE 0.92 0.90  GLUCOSE 102* 99    Electrolytes  Recent Labs Lab 09/29/15 1500  CALCIUM 9.2    CBC  Recent Labs Lab 09/29/15 1500 09/29/15 1506  WBC 7.4  --   HGB 13.5 14.3  HCT 39.1 42.0  PLT 211  --  Coag's  Recent Labs Lab 09/29/15 1500  INR 0.96    Sepsis Markers No results for input(s): LATICACIDVEN, PROCALCITON, O2SATVEN in the last 168 hours.  ABG  Recent Labs Lab 09/29/15 1728  PHART 7.438  PCO2ART 27.3*  PO2ART 128*    Liver Enzymes  Recent Labs Lab 09/29/15 1500  AST 47*  ALT 40  ALKPHOS 65  BILITOT 1.2  ALBUMIN 4.3    Cardiac Enzymes  Recent Labs Lab 09/29/15 1500  TROPONINI 0.04*    Glucose  Recent Labs Lab 09/29/15 1455  GLUCAP 104*    Imaging Ct Head Wo Contrast  09/29/2015  CLINICAL DATA:  [place] and hit head last night, somnolence since fall, nonresponsive, pinpoint pupils EXAM: CT HEAD WITHOUT CONTRAST CT CERVICAL SPINE WITHOUT CONTRAST TECHNIQUE: Multidetector CT imaging of the head and cervical spine was performed following the standard protocol without intravenous contrast. Multiplanar CT image reconstructions of the cervical spine were also generated. COMPARISON:  None. FINDINGS: CT HEAD FINDINGS There is an acute hemorrhage centered on the right thalamus measuring about 3 cm. Blood extends into the third ventricle and layers  dependently in both occipital horns as well as in the fourth ventricle. Mild prominence of the ventricles including the temporal horns. No extra-axial fluid collection identified. No skull fracture identified. Scattered bilateral ethmoid air cell opacification. CT CERVICAL SPINE FINDINGS Evaluation of the lower cervical and upper thoracic spine limited by motion. Orogastric and endotracheal tubes visualized in part. No acute soft tissue abnormality visualized. Visualized portion lung apices clear. Reversed lordosis centered at the mid to lower cervical spine appears to be due to degenerative disc disease. There is degenerative disc disease of moderate severity at C3-4, C4-5, and C5-6. No definite fracture identified. IMPRESSION: Acute intracranial hemorrhage with intraventricular extension and possible developing hydrocephalus Although no fractures identified in the cervical spine, the evaluation of the cervical spine is limited. Critical Value/emergent results were called by telephone at the time of interpretation on 09/29/2015 at 5:22 pm to Dr. Ezequiel Essex , who verbally acknowledged these results. Electronically Signed   By: Skipper Cliche M.D.   On: 09/29/2015 17:22   Ct Cervical Spine Wo Contrast  09/29/2015  CLINICAL DATA:  [place] and hit head last night, somnolence since fall, nonresponsive, pinpoint pupils EXAM: CT HEAD WITHOUT CONTRAST CT CERVICAL SPINE WITHOUT CONTRAST TECHNIQUE: Multidetector CT imaging of the head and cervical spine was performed following the standard protocol without intravenous contrast. Multiplanar CT image reconstructions of the cervical spine were also generated. COMPARISON:  None. FINDINGS: CT HEAD FINDINGS There is an acute hemorrhage centered on the right thalamus measuring about 3 cm. Blood extends into the third ventricle and layers dependently in both occipital horns as well as in the fourth ventricle. Mild prominence of the ventricles including the temporal horns. No  extra-axial fluid collection identified. No skull fracture identified. Scattered bilateral ethmoid air cell opacification. CT CERVICAL SPINE FINDINGS Evaluation of the lower cervical and upper thoracic spine limited by motion. Orogastric and endotracheal tubes visualized in part. No acute soft tissue abnormality visualized. Visualized portion lung apices clear. Reversed lordosis centered at the mid to lower cervical spine appears to be due to degenerative disc disease. There is degenerative disc disease of moderate severity at C3-4, C4-5, and C5-6. No definite fracture identified. IMPRESSION: Acute intracranial hemorrhage with intraventricular extension and possible developing hydrocephalus Although no fractures identified in the cervical spine, the evaluation of the cervical spine is limited. Critical Value/emergent results were called by telephone  at the time of interpretation on 09/29/2015 at 5:22 pm to Dr. Ezequiel Essex , who verbally acknowledged these results. Electronically Signed   By: Skipper Cliche M.D.   On: 09/29/2015 17:22   Dg Chest Portable 1 View  09/29/2015  CLINICAL DATA:  Pt fell last night and is unresponsive. Post intubation and ng tube placement EXAM: PORTABLE CHEST 1 VIEW COMPARISON:  04/07/2015 FINDINGS: Endotracheal tube extends just into the right mainstem bronchus. Recommend retracting 2 cm. Nasal/orogastric tube passes well below the diaphragm into the distal stomach. Cardiac silhouette normal in size and configuration. Normal mediastinal and hilar contours. Clear lungs. No apparent pleural effusion and no gross pneumothorax on this supine study. Bony thorax is intact. IMPRESSION: 1. Endotracheal tube tip extends just into the right mainstem bronchus. This will need retracted 2 cm for optimal positioning. Critical Value/emergent results were called by telephone at the time of interpretation on 09/29/2015 at 3:33 pm to Dr. Ezequiel Essex , who verbally acknowledged these results. 2.  Well-positioned nasal/orogastric tube. 3. No acute cardiopulmonary disease. Electronically Signed   By: Lajean Manes M.D.   On: 09/29/2015 15:33     STUDIES:  CT Head W/O 6/15: Acute intracranial hemorrhage with intraventricular extension and possible developing hydrocephalus Although no fractures identified in the cervical spine, the evaluation of the cervical spine is limited. CT Neck 6/15: Limited evaluation of cervical spine due to motion. No fractures evident. Port CXR 6/15: Silhouetting left hemidiaphragm. Endotracheal tube tip at the level of the carina to nearly entering right mainstem bronchus. Orogastric tube passing below diaphragm. Port CXR 6/16>>  MICROBIOLOGY: MRSA PCR 6/15 >> Blood Ctx x2 6/15 >>  ANTIBIOTICS: Acef (prophylactic abx)  SIGNIFICANT EVENTS: 6/15 - Admit to ICU on ventilator for ICH after transfer from [facility] ED  LINES/TUBES: OETT 7.5 6/15 >>> Foley 6/15 >> OGT 6/15 >> PIV X3  DISCUSSION: 60 year old female past medical history of coronary artery disease, ischemic cardiomyopathy, and polysubstance abuse being admitted with right thalamic intracranial hemorrhage. She is intubated for airway protection and was transferred to [facility]. Will maintain on ventilator with tight blood pressure control with Cardene infusion. Neurology and neurosurgery consulted.  ASSESSMENT / PLAN:  PULMONARY A: Unable to Protect Airway - Secondary to [place].  P:   Full ventilator support, 8cc/kg Chest x-ray for endotracheal tube placement post-transport ABG in AM Vent bundle  CARDIOVASCULAR A:  Chronic Systolic CHF - EF [id number] w/ grade 1 diastolic dysfunction. RV normal. H/O Hypertension H/O CAD - S/P PCI in 2015 H/O Hyperlipidemia  P:  Telemetry monitoring in ICU Tight BP control with SBP goal < 152mmHg No beta blocker as positive for cocaine Cardene gtt for BP control Lipitor VT daily Holding home losartan, carvedilol, plavix, and  HCTZ  RENAL A:   Metabolic Acidosis - Mild.  P:   Monitor renal function and UOP Strict I&O Correct electrolytes as indicated  GASTROINTESTINAL A:   H/O GERD  P:   NPO Pepcid IV qdaily  HEMATOLOGIC A:   No acute issues.  P:  Trending cell counts daily w/ CBC SCDs  No chemical DVT ppx given ICH  INFECTIOUS A:   No acute evidence of infection. H/O HSV Infection  P:   Follow WBC and fever curve Prophylactic Abx for Ventriculostomy  ENDOCRINE A:   No acute issues  P:   Follow CBG on daily BMP  NEUROLOGIC A:   Right Thalamic Intracranial Hemorrhage  Cocaine Abuse - urine drug screen positive  for cocaine on admission H/O EtOH Abuse H/O Depression  P:   RASS goal: 0 Neurology Consult Neurosurgery Consult Propofol gtt Fentanyl IV prn Blood pressure control as above   FAMILY  - Updates: Family updated at bedside 6/16 by Dr. Cyndy Freeze.  - Inter-disciplinary family meet or Palliative Care meeting due by:  6/22  I have spent a total of 36 minutes of critical care time today caring for the patient and reviewing the patient's electronic medical record.  Sonia Baller Ashok Cordia, M.D. [facility] Pager:  [phone] After 3pm or if no response, call [phone] 09/29/2015 6:40 PM

## 2015-09-29 NOTE — ED Provider Notes (Signed)
CSN: [id number]     Arrival date & time    History   First MD Initiated Contact with Patient 09/29/15 1505     Chief Complaint  Patient presents with  . Altered Mental Status     (Consider location/radiation/quality/duration/timing/severity/associated sxs/prior Treatment) HPI Comments: Level V caveat. Patient found down at home. Reportedly had a fall during the night and has "been sleeping all day". Patient is breathing adequately but not responsive on arrival. Does not respond to painful stimuli. No spontaneous movement. Positive gag. Pinpoint pupils with left-sided gaze. No evidence of obvious trauma.  Patient is a 60 y.o. female presenting with altered mental status. The history is provided by the patient and the EMS personnel. The history is limited by the condition of the patient.  Altered Mental Status   Past Medical History  Diagnosis Date  . Essential hypertension   . Depression   . Hyperlipidemia with target LDL less than 70   . Alcohol abuse   . History of cocaine use   . Herpes simplex virus (HSV) infection 2016    Antibodies  . GERD (gastroesophageal reflux disease)   . Pneumonia 01/2015  . HSV (herpes simplex virus) infection     hx/notes 03/02/2015  . CAD S/P RCA (in 2015 then 03/16/2015) 03/17/2015    a. Reportedly 2 stents placed [place], [name]; cath 03/16/2015 Mid RCA to Dist RCA lesion, 75%-90% stenosed. Moderate to severe disease in the proximal to mid RCA. Post intervention with overlapping drug-eluting stents, 2.5 x 38 mmSynergy and 2.75 x 8mm Synergy- postdilated to 3.3 mm, there is a 0% residual stenosis.   [name] History of MI (myocardial infarction)     "they say I've had one sometime down the line" (03/16/2015)  . Cardiomyopathy, ischemic - suggest prior Anterior & Inferio MI     Echo 123XX123: Systolic function was severely reduced. EF~ 20%. ~Grade 1 diastolic dysfunction. Severe HK of entire anterior myocardium; Mod HK of the basal-mid anteroseptal, mid  inferolateral, and mid anterolateral myocardium; mild HK of the mid inferoseptal and basal inferolateral myocardium.    Past Surgical History  Procedure Laterality Date  . Tubal ligation  1983  . Cardiac catheterization N/A 03/16/2015    Procedure: Right/Left Heart Cath and Coronary Angiography;  Surgeon: Jettie Booze, MD;  Location: [facility];  Service: Cardiovascular;  Laterality: N/A;  . Cardiac catheterization Right 03/16/2015    Procedure: Coronary Stent Intervention;  Surgeon: Jettie Booze, MD;  Location: [facility];  Service: Cardiovascular;  Laterality: Right;  . Coronary angioplasty with stent placement  2015    `2 stents"  . Colonoscopy N/A 06/15/2015    Procedure: Colonoscopy ;  Surgeon: Rogene Houston, MD;  [facility];  Service: Endoscopy;  Laterality: N/A;  930-moved to 06/15/15 @ 7:30 - Ann notified pt   Family History  Problem Relation Age of Onset  . Breast cancer Sister   . Hypertension Brother    Social History  Substance Use Topics  . Smoking status: Current Every Day Smoker -- 0.12 packs/day for 32 years    Types: Cigarettes  . Smokeless tobacco: Never Used  . Alcohol Use: 11.4 oz/week    0 Standard drinks or equivalent, 19 Cans of beer per week     Comment: 03/16/2015 "2, 16oz beers/day"   OB History    Gravida Para Term Preterm AB TAB SAB Ectopic Multiple Living   3 3  3     Review of Systems  Unable to perform ROS: Mental status change      Allergies  Review of patient's allergies indicates no known allergies.  Home Medications   Prior to Admission medications   Medication Sig Start Date End Date Taking? Authorizing Provider  aspirin 81 MG tablet Take 81 mg by mouth daily.    Historical Provider, MD  atorvastatin (LIPITOR) 80 MG tablet Take 1 tablet (80 mg total) by mouth daily at 6 PM. 03/17/15   Almyra Deforest, PA  carvedilol (COREG) 6.25 MG tablet Take 1 tablet (6.25 mg total) by mouth 2 (two) times  daily. 03/02/15   Satira Sark, MD  clopidogrel (PLAVIX) 75 MG tablet Take 1 tablet (75 mg total) by mouth daily. 03/17/15   Almyra Deforest, PA  hydrochlorothiazide (HYDRODIURIL) 12.5 MG tablet Take 1 tablet (12.5 mg total) by mouth daily. 11/25/14   Kristen N Ward, DO  losartan (COZAAR) 50 MG tablet Take 1 tablet (50 mg total) by mouth daily. 01/18/15   Satira Sark, MD  nitroGLYCERIN (NITROSTAT) 0.4 MG SL tablet Place 1 tablet (0.4 mg total) under the tongue every 5 (five) minutes as needed for chest pain. 03/17/15   Almyra Deforest, PA  potassium chloride 20 MEQ TBCR Take 20 mEq by mouth daily. 04/08/15   Tesfaye Fanta, MD   BP 245/110 mmHg  Pulse 115  Temp(Src) 100.9 F (38.3 C) (Rectal)  Resp 21  Ht 5\' 3"  (1.6 m)  Wt 105 lb (47.628 kg)  BMI 18.60 kg/m2  SpO2 100% Physical Exam  Constitutional: She is oriented to person, place, and time. She appears well-developed and well-nourished. No distress.  Unresponsive, breathing on own  HENT:  Head: Normocephalic and atraumatic.  Mouth/Throat: Oropharynx is clear and moist. No oropharyngeal exudate.  + Gag  Eyes: Conjunctivae and EOM are normal. Pupils are equal, round, and reactive to light.  Left-sided gaze, pupils 3 mm bilaterally  Neck: Normal range of motion. Neck supple.  No meningismus.  Cardiovascular: Normal rate, normal heart sounds and intact distal pulses.   No murmur heard. tachycardic  Pulmonary/Chest: Effort normal and breath sounds normal. No respiratory distress.  Abdominal: Soft. There is no tenderness. There is no rebound and no guarding.  Musculoskeletal: Normal range of motion. She exhibits no edema or tenderness.  Neurological: She is alert and oriented to person, place, and time. No cranial nerve deficit. She exhibits normal muscle tone. Coordination normal.  Unresponsive, does not follow commands, minimal spontaneous movement  Skin: Skin is warm.  Psychiatric: She has a normal mood and affect. Her behavior is normal.   Nursing note and vitals reviewed.   ED Course  .Intubation Date/Time: 09/29/2015 3:28 PM Performed by: Ezequiel Essex Authorized by: Ezequiel Essex Consent: The procedure was performed in an emergent situation. Risks and benefits: risks, benefits and alternatives were discussed Consent given by: patient Patient identity confirmed: anonymous protocol, patient vented/unresponsive Indications: respiratory failure and  airway protection Intubation method: video-assisted Patient status: paralyzed (RSI) Preoxygenation: nonrebreather mask Sedatives: etomidate Paralytic: succinylcholine Laryngoscope size: Mac 4 Tube size: 7.5 mm Tube type: cuffed Number of attempts: 1 Ventilation between attempts: BVM Cricoid pressure: no Cords visualized: yes Post-procedure assessment: chest rise and ETCO2 monitor Breath sounds: equal Cuff inflated: yes ETT to lip: 24 cm Tube secured with: ETT holder Chest x-ray interpreted by me. Chest x-ray findings: endotracheal tube in appropriate position Patient tolerance: Patient tolerated the procedure well with no immediate complications   (including  critical care time) Labs Review Labs Reviewed  COMPREHENSIVE METABOLIC PANEL - Abnormal; Notable for the following:    CO2 20 (*)    Glucose, Bld 102 (*)    Total Protein 8.9 (*)    AST 47 (*)    All other components within normal limits  URINALYSIS, ROUTINE W REFLEX MICROSCOPIC (NOT AT [facility]) - Abnormal; Notable for the following:    APPearance HAZY (*)    Hgb urine dipstick SMALL (*)    Ketones, ur 15 (*)    Protein, ur 30 (*)    Nitrite POSITIVE (*)    Leukocytes, UA SMALL (*)    All other components within normal limits  URINE RAPID DRUG SCREEN, HOSP PERFORMED - Abnormal; Notable for the following:    Cocaine POSITIVE (*)    All other components within normal limits  ACETAMINOPHEN LEVEL - Abnormal; Notable for the following:    Acetaminophen (Tylenol), Serum <10 (*)    All other  components within normal limits  TROPONIN I - Abnormal; Notable for the following:    Troponin I 0.04 (*)    All other components within normal limits  URINE MICROSCOPIC-ADD ON - Abnormal; Notable for the following:    Squamous Epithelial / LPF 0-5 (*)    Bacteria, UA MANY (*)    All other components within normal limits  BLOOD GAS, ARTERIAL - Abnormal; Notable for the following:    pCO2 arterial 27.3 (*)    pO2, Arterial 128 (*)    Acid-base deficit 5.2 (*)    All other components within normal limits  CBG MONITORING, ED - Abnormal; Notable for the following:    Glucose-Capillary 104 (*)    All other components within normal limits  CULTURE, BLOOD (ROUTINE X 2)  CULTURE, BLOOD (ROUTINE X 2)  CBC WITH DIFFERENTIAL/PLATELET  SALICYLATE LEVEL  ETHANOL  CK  PROTIME-INR  LACTIC ACID, PLASMA  I-STAT BETA HCG BLOOD, ED ([facility], [facility], [facility] ONLY)  I-STAT CG4 LACTIC ACID, ED  I-STAT CHEM 8, ED    Imaging Review Ct Head Wo Contrast  09/29/2015  CLINICAL DATA:  [place] and hit head last night, somnolence since fall, nonresponsive, pinpoint pupils EXAM: CT HEAD WITHOUT CONTRAST CT CERVICAL SPINE WITHOUT CONTRAST TECHNIQUE: Multidetector CT imaging of the head and cervical spine was performed following the standard protocol without intravenous contrast. Multiplanar CT image reconstructions of the cervical spine were also generated. COMPARISON:  None. FINDINGS: CT HEAD FINDINGS There is an acute hemorrhage centered on the right thalamus measuring about 3 cm. Blood extends into the third ventricle and layers dependently in both occipital horns as well as in the fourth ventricle. Mild prominence of the ventricles including the temporal horns. No extra-axial fluid collection identified. No skull fracture identified. Scattered bilateral ethmoid air cell opacification. CT CERVICAL SPINE FINDINGS Evaluation of the lower cervical and upper thoracic spine limited by motion. Orogastric and endotracheal tubes visualized  in part. No acute soft tissue abnormality visualized. Visualized portion lung apices clear. Reversed lordosis centered at the mid to lower cervical spine appears to be due to degenerative disc disease. There is degenerative disc disease of moderate severity at C3-4, C4-5, and C5-6. No definite fracture identified. IMPRESSION: Acute intracranial hemorrhage with intraventricular extension and possible developing hydrocephalus Although no fractures identified in the cervical spine, the evaluation of the cervical spine is limited. Critical Value/emergent results were called by telephone at the time of interpretation on 09/29/2015 at 5:22 pm to Dr. Ezequiel Essex , who verbally  acknowledged these results. Electronically Signed   By: Skipper Cliche M.D.   On: 09/29/2015 17:22   Ct Cervical Spine Wo Contrast  09/29/2015  CLINICAL DATA:  [place] and hit head last night, somnolence since fall, nonresponsive, pinpoint pupils EXAM: CT HEAD WITHOUT CONTRAST CT CERVICAL SPINE WITHOUT CONTRAST TECHNIQUE: Multidetector CT imaging of the head and cervical spine was performed following the standard protocol without intravenous contrast. Multiplanar CT image reconstructions of the cervical spine were also generated. COMPARISON:  None. FINDINGS: CT HEAD FINDINGS There is an acute hemorrhage centered on the right thalamus measuring about 3 cm. Blood extends into the third ventricle and layers dependently in both occipital horns as well as in the fourth ventricle. Mild prominence of the ventricles including the temporal horns. No extra-axial fluid collection identified. No skull fracture identified. Scattered bilateral ethmoid air cell opacification. CT CERVICAL SPINE FINDINGS Evaluation of the lower cervical and upper thoracic spine limited by motion. Orogastric and endotracheal tubes visualized in part. No acute soft tissue abnormality visualized. Visualized portion lung apices clear. Reversed lordosis centered at the mid to lower  cervical spine appears to be due to degenerative disc disease. There is degenerative disc disease of moderate severity at C3-4, C4-5, and C5-6. No definite fracture identified. IMPRESSION: Acute intracranial hemorrhage with intraventricular extension and possible developing hydrocephalus Although no fractures identified in the cervical spine, the evaluation of the cervical spine is limited. Critical Value/emergent results were called by telephone at the time of interpretation on 09/29/2015 at 5:22 pm to Dr. Ezequiel Essex , who verbally acknowledged these results. Electronically Signed   By: Skipper Cliche M.D.   On: 09/29/2015 17:22   Dg Chest Portable 1 View  09/29/2015  CLINICAL DATA:  Pt fell last night and is unresponsive. Post intubation and ng tube placement EXAM: PORTABLE CHEST 1 VIEW COMPARISON:  04/07/2015 FINDINGS: Endotracheal tube extends just into the right mainstem bronchus. Recommend retracting 2 cm. Nasal/orogastric tube passes well below the diaphragm into the distal stomach. Cardiac silhouette normal in size and configuration. Normal mediastinal and hilar contours. Clear lungs. No apparent pleural effusion and no gross pneumothorax on this supine study. Bony thorax is intact. IMPRESSION: 1. Endotracheal tube tip extends just into the right mainstem bronchus. This will need retracted 2 cm for optimal positioning. Critical Value/emergent results were called by telephone at the time of interpretation on 09/29/2015 at 3:33 pm to Dr. Ezequiel Essex , who verbally acknowledged these results. 2. Well-positioned nasal/orogastric tube. 3. No acute cardiopulmonary disease. Electronically Signed   By: Lajean Manes M.D.   On: 09/29/2015 15:33   I have personally reviewed and evaluated these images and lab results as part of my medical decision-making.   EKG Interpretation   Date/Time:  Thursday September 29 2015 14:53:17 EDT Ventricular Rate:  70 PR Interval:  120 QRS Duration: 110 QT Interval:   432 QTC Calculation: 466 R Axis:   88 Text Interpretation:  Sinus rhythm Borderline right axis deviation LVH  with IVCD and secondary repol abnrm Anterior ST elevation, probably due to  LVH No significant change was found Confirmed by Wyvonnia Dusky  MD, [place]  [phone]) on 09/29/2015 3:26:01 PM      MDM   Final diagnoses:  Nontraumatic intracerebral hemorrhage in brainstem, unspecified laterality ([place])   Patient found down at home after having a fall. Concern for intracranial hemorrhage. Not protecting airway on arrival. Intubated as above.  Patient is hypertensive and minimally responsive on arrival. CT head shows acute thalamic  hemorrhage extending into the ventricles. She is not anticoagulated but is on Plavix.  Patient started on Cardene gtt. Propofol for sedation. No beta blocker given positive cocaine use. Drug screen positive for cocaine. We'll treat UTI. Patient has become febrile in the ED but this may be due to her hemorrhage. Cultures sent.  D/w neurology Dr. Cheral Marker who agrees with supportive care, blood pressure control, reversal of antiplatelet agents. He will see patient in [facility]. Also discussed with Dr. Cyndy Freeze of neurosurgery who will evaluate given early hydrocephalus. Discussed with Dr. [place] Bells of critical care who will admit patient to [facility] ICU. Family updated.   CRITICAL CARE Performed by: Ezequiel Essex Total critical care time: 60 minutes Critical care time was exclusive of separately billable procedures and treating other patients. Critical care was necessary to treat or prevent imminent or life-threatening deterioration. Critical care was time spent personally by me on the following activities: development of treatment plan with patient and/or surrogate as well as nursing, discussions with consultants, evaluation of patient's response to treatment, examination of patient, obtaining history from patient or surrogate, ordering and performing  treatments and interventions, ordering and review of laboratory studies, ordering and review of radiographic studies, pulse oximetry and re-evaluation of patient's condition.    Ezequiel Essex, MD 09/29/15 2157

## 2015-09-29 NOTE — ED Notes (Signed)
Intubation with 7.16mm tube securred @24  at the lip with positive color change @1517 .

## 2015-09-30 ENCOUNTER — Encounter (HOSPITAL_COMMUNITY): Payer: Self-pay | Admitting: Radiology

## 2015-09-30 ENCOUNTER — Inpatient Hospital Stay (HOSPITAL_COMMUNITY): Payer: Medicaid Other

## 2015-09-30 DIAGNOSIS — G911 Obstructive hydrocephalus: Secondary | ICD-10-CM

## 2015-09-30 DIAGNOSIS — G936 Cerebral edema: Secondary | ICD-10-CM

## 2015-09-30 DIAGNOSIS — G935 Compression of brain: Secondary | ICD-10-CM

## 2015-09-30 DIAGNOSIS — J9601 Acute respiratory failure with hypoxia: Secondary | ICD-10-CM

## 2015-09-30 DIAGNOSIS — J9600 Acute respiratory failure, unspecified whether with hypoxia or hypercapnia: Secondary | ICD-10-CM | POA: Insufficient documentation

## 2015-09-30 LAB — BLOOD GAS, ARTERIAL
Acid-base deficit: 4.3 mmol/L — ABNORMAL HIGH (ref 0.0–2.0)
Bicarbonate: 19.8 mEq/L — ABNORMAL LOW (ref 20.0–24.0)
DRAWN BY: 42624
FIO2: 0.5
MECHVT: 420 mL
O2 SAT: 98.9 %
PATIENT TEMPERATURE: 98.6
PCO2 ART: 33.8 mmHg — AB (ref 35.0–45.0)
PEEP: 5 cmH2O
PH ART: 7.387 (ref 7.350–7.450)
RATE: 14 resp/min
TCO2: 20.9 mmol/L (ref 0–100)
pO2, Arterial: 186 mmHg — ABNORMAL HIGH (ref 80.0–100.0)

## 2015-09-30 LAB — CBC
HEMATOCRIT: 32.3 % — AB (ref 36.0–46.0)
HEMOGLOBIN: 10.5 g/dL — AB (ref 12.0–15.0)
MCH: 31.1 pg (ref 26.0–34.0)
MCHC: 32.5 g/dL (ref 30.0–36.0)
MCV: 95.6 fL (ref 78.0–100.0)
Platelets: 174 10*3/uL (ref 150–400)
RBC: 3.38 MIL/uL — ABNORMAL LOW (ref 3.87–5.11)
RDW: 13.2 % (ref 11.5–15.5)
WBC: 6.5 10*3/uL (ref 4.0–10.5)

## 2015-09-30 LAB — GLUCOSE, CAPILLARY
GLUCOSE-CAPILLARY: 158 mg/dL — AB (ref 65–99)
Glucose-Capillary: 137 mg/dL — ABNORMAL HIGH (ref 65–99)
Glucose-Capillary: 126 mg/dL — ABNORMAL HIGH (ref 65–99)

## 2015-09-30 LAB — RAPID URINE DRUG SCREEN, HOSP PERFORMED
Amphetamines: NOT DETECTED
BARBITURATES: NOT DETECTED
BENZODIAZEPINES: POSITIVE — AB
COCAINE: POSITIVE — AB
OPIATES: NOT DETECTED
TETRAHYDROCANNABINOL: NOT DETECTED

## 2015-09-30 LAB — BASIC METABOLIC PANEL
Anion gap: 8 (ref 5–15)
BUN: 11 mg/dL (ref 6–20)
CO2: 19 mmol/L — ABNORMAL LOW (ref 22–32)
Calcium: 8.1 mg/dL — ABNORMAL LOW (ref 8.9–10.3)
Chloride: 111 mmol/L (ref 101–111)
Creatinine, Ser: 0.85 mg/dL (ref 0.44–1.00)
GFR calc Af Amer: >60 mL/min (ref >60)
GFR calc non Af Amer: >60 mL/min (ref >60)
Glucose, Bld: 121 mg/dL — ABNORMAL HIGH (ref 65–99)
Potassium: 3.1 mmol/L — ABNORMAL LOW (ref 3.5–5.1)
Sodium: 138 mmol/L (ref 135–145)

## 2015-09-30 LAB — TYPE AND SCREEN
ABO/RH(D): A POS
Antibody Screen: NEGATIVE

## 2015-09-30 LAB — PHOSPHORUS
PHOSPHORUS: 2.1 mg/dL — AB (ref 2.5–4.6)
PHOSPHORUS: 2.5 mg/dL (ref 2.5–4.6)
Phosphorus: 3.1 mg/dL (ref 2.5–4.6)

## 2015-09-30 LAB — MRSA PCR SCREENING: MRSA BY PCR: NEGATIVE

## 2015-09-30 LAB — MAGNESIUM
MAGNESIUM: 2 mg/dL (ref 1.7–2.4)
Magnesium: 1.9 mg/dL (ref 1.7–2.4)
Magnesium: 1.9 mg/dL (ref 1.7–2.4)

## 2015-09-30 LAB — ABO/RH: ABO/RH(D): A POS

## 2015-09-30 LAB — TRIGLYCERIDES: Triglycerides: 127 mg/dL (ref <150)

## 2015-09-30 MED ORDER — ASPIRIN 81 MG PO CHEW: 324.0000 mg | CHEWABLE_TABLET | ORAL | Status: DC

## 2015-09-30 MED ORDER — PRO-STAT SUGAR FREE PO LIQD
30.0000 mL | Freq: Two times a day (BID) | ORAL | Status: DC
  Administered 2015-09-30: 30 mL
  Filled 2015-09-30: qty 30

## 2015-09-30 MED ORDER — CHLORHEXIDINE GLUCONATE 0.12% ORAL RINSE (MEDLINE KIT)
15.0000 mL | Freq: Two times a day (BID) | OROMUCOSAL | Status: DC
  Administered 2015-09-30 – 2015-10-25 (×51): 15 mL via OROMUCOSAL

## 2015-09-30 MED ORDER — ADULT MULTIVITAMIN LIQUID CH
15.0000 mL | Freq: Every day | ORAL | Status: DC
  Administered 2015-09-30 – 2015-11-10 (×39): 15 mL
  Filled 2015-09-30 (×42): qty 15

## 2015-09-30 MED ORDER — VITAL HIGH PROTEIN PO LIQD: 1000.0000 mL | ORAL | Status: DC

## 2015-09-30 MED ORDER — FAMOTIDINE IN NACL 20-0.9 MG/50ML-% IV SOLN
20.0000 mg | Freq: Two times a day (BID) | INTRAVENOUS | Status: DC
  Administered 2015-09-30 – 2015-10-01 (×3): 20 mg via INTRAVENOUS
  Filled 2015-09-30 (×3): qty 50

## 2015-09-30 MED ORDER — PROPOFOL 1000 MG/100ML IV EMUL
5.0000 ug/kg/min | INTRAVENOUS | Status: DC
  Administered 2015-09-30: 50 ug/kg/min via INTRAVENOUS
  Administered 2015-09-30: 80 ug/kg/min via INTRAVENOUS
  Administered 2015-09-30 (×2): 40 ug/kg/min via INTRAVENOUS
  Administered 2015-10-01: 50 ug/kg/min via INTRAVENOUS
  Administered 2015-10-01 – 2015-10-02 (×3): 30 ug/kg/min via INTRAVENOUS
  Administered 2015-10-02: 20 ug/kg/min via INTRAVENOUS
  Administered 2015-10-03: 30 ug/kg/min via INTRAVENOUS
  Administered 2015-10-03 – 2015-10-04 (×2): 20 ug/kg/min via INTRAVENOUS
  Administered 2015-10-05: 25 ug/kg/min via INTRAVENOUS
  Administered 2015-10-05: 15 ug/kg/min via INTRAVENOUS
  Administered 2015-10-06: 25 ug/kg/min via INTRAVENOUS
  Administered 2015-10-06: 12.5 ug/kg/min via INTRAVENOUS
  Administered 2015-10-07: 20 ug/kg/min via INTRAVENOUS
  Administered 2015-10-07: 15 ug/kg/min via INTRAVENOUS
  Filled 2015-09-30 (×17): qty 100

## 2015-09-30 MED ORDER — SODIUM CHLORIDE 0.9 % IV SOLN
250.0000 mL | INTRAVENOUS | Status: DC | PRN
  Administered 2015-10-27: 250 mL via INTRAVENOUS

## 2015-09-30 MED ORDER — VITAL HIGH PROTEIN PO LIQD
1000.0000 mL | ORAL | Status: DC
  Administered 2015-09-30: 16:00:00
  Administered 2015-09-30: 1000 mL
  Administered 2015-10-01 (×2)
  Administered 2015-10-01 (×3): 1000 mL
  Administered 2015-10-01 – 2015-10-02 (×7)
  Administered 2015-10-02: 1000 mL
  Administered 2015-10-02: 18:00:00
  Administered 2015-10-03: 1000 mL
  Administered 2015-10-03: 15:00:00

## 2015-09-30 MED ORDER — VITAL HIGH PROTEIN PO LIQD
1000.0000 mL | ORAL | Status: DC
  Administered 2015-09-30: 1000 mL

## 2015-09-30 MED ORDER — NICARDIPINE HCL IN NACL 20-0.86 MG/200ML-% IV SOLN
3.0000 mg/h | INTRAVENOUS | Status: DC
  Administered 2015-09-30 (×2): 5 mg/h via INTRAVENOUS
  Administered 2015-09-30: 7.5 mg/h via INTRAVENOUS
  Administered 2015-10-01: 3 mg/h via INTRAVENOUS
  Filled 2015-09-30 (×2): qty 200

## 2015-09-30 MED ORDER — ASPIRIN 300 MG RE SUPP: 300.0000 mg | RECTAL | Status: DC

## 2015-09-30 MED ORDER — PRO-STAT SUGAR FREE PO LIQD
30.0000 mL | Freq: Every day | ORAL | Status: DC
  Administered 2015-10-01 – 2015-10-03 (×3): 30 mL
  Filled 2015-09-30 (×3): qty 30

## 2015-09-30 MED ORDER — IOPAMIDOL (ISOVUE-370) INJECTION 76%
INTRAVENOUS | Status: AC
  Administered 2015-09-30: 50 mL
  Filled 2015-09-30: qty 50

## 2015-09-30 MED ORDER — LACTATED RINGERS IV SOLN
INTRAVENOUS | Status: DC
  Administered 2015-09-30 – 2015-10-01 (×3): via INTRAVENOUS
  Administered 2015-10-05 – 2015-10-07 (×2): 40 mL/h via INTRAVENOUS
  Administered 2015-10-09 – 2015-10-11 (×2): via INTRAVENOUS

## 2015-09-30 MED ORDER — CEFAZOLIN SODIUM 1-5 GM-% IV SOLN
1.0000 g | Freq: Once | INTRAVENOUS | Status: AC
  Administered 2015-09-30: 1 g via INTRAVENOUS
  Filled 2015-09-30: qty 50

## 2015-09-30 MED ORDER — FENTANYL CITRATE (PF) 100 MCG/2ML IJ SOLN
25.0000 ug | INTRAMUSCULAR | Status: DC | PRN
  Administered 2015-10-03 (×2): 50 ug via INTRAVENOUS
  Filled 2015-09-30 (×2): qty 2

## 2015-09-30 MED ORDER — ANTISEPTIC ORAL RINSE SOLUTION (CORINZ)
7.0000 mL | OROMUCOSAL | Status: DC
  Administered 2015-09-30 – 2015-10-25 (×249): 7 mL via OROMUCOSAL

## 2015-09-30 MED ORDER — DEXTROSE 5 % IV SOLN
1.0000 g | INTRAVENOUS | Status: AC
  Administered 2015-09-30 – 2015-10-01 (×2): 1 g via INTRAVENOUS
  Filled 2015-09-30 (×2): qty 10

## 2015-09-30 MED ORDER — FAMOTIDINE IN NACL 20-0.9 MG/50ML-% IV SOLN
20.0000 mg | INTRAVENOUS | Status: DC
  Administered 2015-09-30: 20 mg via INTRAVENOUS
  Filled 2015-09-30: qty 50

## 2015-09-30 MED ORDER — POTASSIUM CHLORIDE 20 MEQ/15ML (10%) PO SOLN
40.0000 meq | Freq: Once | ORAL | Status: AC
  Administered 2015-09-30: 40 meq
  Filled 2015-09-30: qty 30

## 2015-09-30 NOTE — Care Management Note (Signed)
Case Management Note  Patient Details  Name: Joanna Farmer MRN: [id number] Date of Birth: 02-29-1956  Subjective/Objective: Pt admitted on 09/29/15 with ICH.  PTA, pt independent of ADLS.                     Action/Plan: Pt remains sedated and on ventilator.  Will follow for discharge planning as pt progresses.    Expected Discharge Date:                  Expected Discharge Plan:  [place]  In-House Referral:     Discharge planning Services  CM Consult  Post Acute Care Choice:    Choice offered to:     DME Arranged:    DME Agency:     HH Arranged:    [place] Agency:     Status of Service:  In process, will continue to follow  Medicare Important Message Given:    Date Medicare IM Given:    Medicare IM give by:    Date Additional Medicare IM Given:    Additional Medicare Important Message give by:     If discussed at [place] of Stay Meetings, dates discussed:    Additional Comments:  Reinaldo Raddle, RN, BSN  Trauma/Neuro ICU Case Manager [phone]

## 2015-09-30 NOTE — Progress Notes (Signed)
[facility] Progress Note Patient Name: Joanna Farmer DOB: 06/20/1955 MRN: [id number]   Date of Service  09/30/2015  HPI/Events of Note  Hypokalemia  eICU Interventions  Potassium replaced     Intervention Category Intermediate Interventions: Electrolyte abnormality - evaluation and management  Bekka Qian 09/30/2015, 5:16 AM

## 2015-09-30 NOTE — ED Notes (Signed)
[facility] staff at bedside.

## 2015-09-30 NOTE — Consult Note (Signed)
Neurology Consultation Reason for Consult: [place] Referring Physician: Dr Ashok Cordia    HPI: Joanna Farmer is a 60 y.o. female with history of hypertension,alcohol and cocaine abuse, coronary artery disease initially presented with altered mental status and fall was found to have a right basal ganglia hypertensive  CT scan of the head showed  Acute intracranial hemorrhage involving the right basal ganglia with intraventricular extension and possible developing hydrocephalus.Currently status post ventriculostomy, intubated and sedated ICH score unable to calculate since patient is on propofol  PMH: Past Medical History  Diagnosis Date  . Essential hypertension   . Depression   . Hyperlipidemia with target LDL less than 70   . Alcohol abuse   . History of cocaine use   . Herpes simplex virus (HSV) infection 2016    Antibodies  . GERD (gastroesophageal reflux disease)   . Pneumonia 01/2015  . HSV (herpes simplex virus) infection     hx/notes 03/02/2015  . CAD S/P RCA (in 2015 then 03/16/2015) 03/17/2015    a. Reportedly 2 stents placed [place], [name]; cath 03/16/2015 Mid RCA to Dist RCA lesion, 75%-90% stenosed. Moderate to severe disease in the proximal to mid RCA. Post intervention with overlapping drug-eluting stents, 2.5 x 38 mmSynergy and 2.75 x 79mm Synergy- postdilated to 3.3 mm, there is a 0% residual stenosis.   [name] History of MI (myocardial infarction)     "they say I've had one sometime down the line" (03/16/2015)  . Cardiomyopathy, ischemic - suggest prior Anterior & Inferio MI     Echo 123XX123: Systolic function was severely reduced. EF~ 20%. ~Grade 1 diastolic dysfunction. Severe HK of entire anterior myocardium; Mod HK of the basal-mid anteroseptal, mid inferolateral, and mid anterolateral myocardium; mild HK of the mid inferoseptal and basal inferolateral myocardium.     PSH: Past Surgical History  Procedure  Laterality Date  . Tubal ligation  1983  . Cardiac catheterization N/A 03/16/2015    Procedure: Right/Left Heart Cath and Coronary Angiography; Surgeon: Jettie Booze, MD; Location: [facility]; Service: Cardiovascular; Laterality: N/A;  . Cardiac catheterization Right 03/16/2015    Procedure: Coronary Stent Intervention; Surgeon: Jettie Booze, MD; Location: [facility]; Service: Cardiovascular; Laterality: Right;  . Coronary angioplasty with stent placement  2015    `2 stents"  . Colonoscopy N/A 06/15/2015    Procedure: Colonoscopy ; Surgeon: Rogene Houston, MD; [facility]; Service: Endoscopy; Laterality: N/A; 930-moved to 06/15/15 @ 7:30 - Ann notified pt    SH: Social History  Substance Use Topics  . Smoking status: Current Every Day Smoker -- 0.12 packs/day for 32 years    Types: Cigarettes  . Smokeless tobacco: Never Used  . Alcohol Use: 11.4 oz/week    0 Standard drinks or equivalent, 19 Cans of beer per week     Comment: 03/16/2015 "2, 16oz beers/day"    MEDS: Prior to Admission medications   Medication Sig Start Date End Date Taking? Authorizing Provider  atorvastatin (LIPITOR) 80 MG tablet Take 1 tablet (80 mg total) by mouth daily at 6 PM. 03/17/15  Yes Almyra Deforest, PA  losartan (COZAAR) 50 MG tablet Take 1 tablet (50 mg total) by mouth daily. 01/18/15  Yes Satira Sark, MD  potassium chloride 20 MEQ TBCR Take 20 mEq by mouth daily. 04/08/15  Yes Rosita Fire, MD  aspirin 81 MG tablet Take 81 mg by mouth daily.    Historical Provider, MD  carvedilol (COREG) 6.25 MG tablet  Take 1 tablet (6.25 mg total) by mouth 2 (two) times daily. 03/02/15   Satira Sark, MD  clopidogrel (PLAVIX) 75 MG tablet Take 1 tablet (75 mg total) by mouth daily. 03/17/15   Almyra Deforest, PA  hydrochlorothiazide (HYDRODIURIL) 12.5 MG tablet Take 1 tablet  (12.5 mg total) by mouth daily. 11/25/14   Kristen N Ward, DO  nitroGLYCERIN (NITROSTAT) 0.4 MG SL tablet Place 1 tablet (0.4 mg total) under the tongue every 5 (five) minutes as needed for chest pain. 03/17/15   Almyra Deforest, PA    ALLERGY: No Known Allergies       Family History  Problem Relation Age of Onset  . Breast cancer Sister   . Hypertension Brother    Review of systems unable to obtain  Exam: Current vital signs: BP 107/54 mmHg  Pulse 69  Temp(Src) 99.1 F (37.3 C) (Rectal)  Resp 16  Ht 5\' 3"  (1.6 m)  Wt 47.628 kg (105 lb)  BMI 18.60 kg/m2  SpO2 100% Vital signs in last 24 hours: Temp:  [99.1 F (37.3 C)-101.3 F (38.5 C)] 99.1 F (37.3 C) (06/16 0300) Pulse Rate:  [69-115] 69 (06/16 0300) Resp:  [13-21] 16 (06/16 0300) BP: (107-248)/(54-119) 107/54 mmHg (06/16 0300) SpO2:  [97 %-100 %] 100 % (06/16 0319) FiO2 (%):  [50 %] 50 % (06/16 0319) Weight:  [47.628 kg (105 lb)] 47.628 kg (105 lb) (06/15 1457)  Physical Exam  Constitutional: intubated and sedated Eyes: No scleral injection HENT: No OP obstrucion Head: Normocephalic.  Cardiovascular: Normal rate and regular rhythm.  Respiratory:on ventilatory support  Neuro: Mental Status: patient is intubated and sedated with propofol Very mild withdrawal on the right side Pupils are reactive bilaterally,intact corneal reflex     I have reviewed the images obtained:CT scan of the head showed Acute intracranial hemorrhage involving the right basal ganglia with intraventricular extension and possible developing hydrocephalus.  assessment and recommendation   Joanna Farmer is a 60 y.o. female with history of hypertension,alcohol and cocaine abuse, coronary artery disease initially presented with altered mental status and fall was found to have a right basal ganglia hypertensive  Etiology hypertension and cocaine abuse Continue symptomatic management, recommend strict control of blood  pressure   Lurena Joiner, MD 4:07 AM

## 2015-09-30 NOTE — Procedures (Signed)
Pt arrived from [facility] and placed on vent.  Tidal volume changed to match 8cc.

## 2015-09-30 NOTE — Progress Notes (Signed)
Initial Nutrition Assessment  INTERVENTION:   Initiate Vital High Protein @ 30 ml/hr via OG tube.   30 ml Prostat daily.    MVI daily, liquid  Tube feeding regimen provides 820 kcal, 78 grams of protein, and 601 ml of H2O.  TF regimen and propofol at current rate providing 1424 total kcal/day (104 % of kcal needs)  NUTRITION DIAGNOSIS:   Inadequate oral intake related to inability to eat as evidenced by NPO status.  GOAL:   Patient will meet greater than or equal to 90% of their needs  MONITOR:   TF tolerance, Vent status, Labs  REASON FOR ASSESSMENT:   Ventilator    ASSESSMENT:   Pt with past medical history of coronary artery disease, ischemic cardiomyopathy, and polysubstance abuse being admitted with right thalamic intracranial hemorrhage, right frontal external ventricular drain placed 6/16.    Family from [place] at bedside. They have not seen mom since last year and do not know her nutrition hx.   Patient is currently intubated on ventilator support MV: 6 L/min Temp (24hrs), Avg:99.8 F (37.7 C), Min:98.1 F (36.7 C), Max:101.3 F (38.5 C)  Propofol: 22.9 ml/hr provides 604 kcal  Labs reviewed: K+ 3.1, PO4 WNL OG tube Ventric drain Nutrition-Focused physical exam completed. Findings are no fat depletion, no muscle depletion, and no edema.     Diet Order:  Diet NPO time specified  Skin:  Reviewed, no issues  Last BM:  unknown  Height:   Ht Readings from Last 1 Encounters:  09/29/15 5\' 3"  (1.6 m)    Weight:   Wt Readings from Last 1 Encounters:  09/30/15 102 lb 15.3 oz (46.7 kg)    Ideal Body Weight:  52.2 kg  BMI:  Body mass index is 18.24 kg/(m^2).  Estimated Nutritional Needs:   Kcal:  1373  Protein:  70-85 grams  Fluid:  > 1.5 L/day  EDUCATION NEEDS:   No education needs identified at this time  [place], [place], [place] Pager [phone] After Hours Pager

## 2015-09-30 NOTE — Progress Notes (Signed)
PULMONARY / CRITICAL CARE MEDICINE   Name: Joanna Farmer MRN: [id number] DOB: Dec 31, 1955    ADMISSION DATE:  09/29/2015 CONSULTATION DATE:  09/29/2015  REFERRING MD:  Ezequiel Essex, M.D. / [facility] EDP  CHIEF COMPLAINT:  Altered mental status  HISTORY OF PRESENT ILLNESS:   60 year old female past medical history as below, which is significant for hypertension, alcohol abuse, cocaine abuse, HSV, coronary artery disease status post stents to RCA and LAD in 2015, and ischemic cardiomyopathy with LV EF 35-40%. She presented to [facility] emergency department 6/15 with altered mental status. Reportedly the night before she suffered a fall during the night, and since that time has been sleeping all day. She was brought to the emergency department with this complaint. In emergency department she was unresponsive but reportedly breathing adequately. She had no spontaneous movement and left gaze deviation, but did have a positive gag reflex. CT scan of the head was obtained and demonstrated acute intracranial hemorrhage with intraventricular extension. Cervical spine was without radiographic evidence of injury. She was intubated for airway protection with anticipation of transfer to [facility]. PCCM accepting.   SUBJECTIVE: Following intermittent commands on right side on propofol 45mcg/kg/min. SBT this morning- only initiated one breath while on propofol. Remains on Cardene. Going for scheduled repeat head CT at 10. No other issues overnight.   VITAL SIGNS: BP 95/56 mmHg  Pulse 66  Temp(Src) 99.1 F (37.3 C) (Rectal)  Resp 12  Ht 5\' 3"  (1.6 m)  Wt 102 lb 15.3 oz (46.7 kg)  BMI 18.24 kg/m2  SpO2 100%  HEMODYNAMICS:    VENTILATOR SETTINGS: Vent Mode:  [-] PRVC FiO2 (%):  [40 %-50 %] 40 % Set Rate:  [14 bmp] 14 bmp Vt Set:  [420 mL-500 mL] 420 mL PEEP:  [5 cmH20] 5 cmH20 Plateau Pressure:  [12 cmH20-17 cmH20] 15 cmH20  INTAKE / OUTPUT: I/O last 3 completed shifts: In: 524  [I.V.:474; IV Piggyback:50] Out: [id number] [Urine:1100; Drains:26]  PHYSICAL EXAMINATION: General:  Sedated. No acute distress. "boyfriend" at bedside.  Integument:  Warm & dry. No rash on exposed skin.  Lymphatics:  No appreciated cervical or supraclavicular lymphadenoapthy. HEENT:  No scleral injection or icterus. Endotracheal tube in place.  Cardiovascular:  Regular rate. No edema. No appreciable JVD.  Pulmonary:  Good aeration & clear to auscultation bilaterally. Symmetric chest wall rise on ventilator. Abdomen: Soft. Normal bowel sounds. Nondistended.  Musculoskeletal:  Normal bulk. No joint deformity or effusion appreciated. Neurological:  Does not open eyes. Intermittently follows commands on right. Spontaneous moves right, minimally withdrawls in L leg, no movement in left arm. Pupils sluggish 41mm/ disconjugate gaze Psychiatric:  Unable to assess given intubation.  LABS:  BMET  Recent Labs Lab 09/29/15 1500 09/29/15 1506 09/30/15 0430  NA 135 140 138  K 4.0 4.0 3.1*  CL 105 107 111  CO2 20*  --  19*  BUN 16 14 11   CREATININE 0.92 0.90 0.85  GLUCOSE 102* 99 121*    Electrolytes  Recent Labs Lab 09/29/15 1500 09/30/15 0430  CALCIUM 9.2 8.1*  MG  --  2.0  PHOS  --  3.1    CBC  Recent Labs Lab 09/29/15 1500 09/29/15 1506 09/30/15 0430  WBC 7.4  --  6.5  HGB 13.5 14.3 10.5*  HCT 39.1 42.0 32.3*  PLT 211  --  174    Coag's  Recent Labs Lab 09/29/15 1500  INR 0.96    Sepsis Markers  Recent Labs Lab  09/29/15 1500 09/29/15 1511  LATICACIDVEN 1.3 1.41    ABG  Recent Labs Lab 09/29/15 1728 09/30/15 0417  PHART 7.438 7.387  PCO2ART 27.3* 33.8*  PO2ART 128* 186*    Liver Enzymes  Recent Labs Lab 09/29/15 1500  AST 47*  ALT 40  ALKPHOS 65  BILITOT 1.2  ALBUMIN 4.3    Cardiac Enzymes  Recent Labs Lab 09/29/15 1500  TROPONINI 0.04*    Glucose  Recent Labs Lab 09/29/15 1455  GLUCAP 104*    Imaging Ct Head Wo  Contrast  09/29/2015  CLINICAL DATA:  [place] and hit head last night, somnolence since fall, nonresponsive, pinpoint pupils EXAM: CT HEAD WITHOUT CONTRAST CT CERVICAL SPINE WITHOUT CONTRAST TECHNIQUE: Multidetector CT imaging of the head and cervical spine was performed following the standard protocol without intravenous contrast. Multiplanar CT image reconstructions of the cervical spine were also generated. COMPARISON:  None. FINDINGS: CT HEAD FINDINGS There is an acute hemorrhage centered on the right thalamus measuring about 3 cm. Blood extends into the third ventricle and layers dependently in both occipital horns as well as in the fourth ventricle. Mild prominence of the ventricles including the temporal horns. No extra-axial fluid collection identified. No skull fracture identified. Scattered bilateral ethmoid air cell opacification. CT CERVICAL SPINE FINDINGS Evaluation of the lower cervical and upper thoracic spine limited by motion. Orogastric and endotracheal tubes visualized in part. No acute soft tissue abnormality visualized. Visualized portion lung apices clear. Reversed lordosis centered at the mid to lower cervical spine appears to be due to degenerative disc disease. There is degenerative disc disease of moderate severity at C3-4, C4-5, and C5-6. No definite fracture identified. IMPRESSION: Acute intracranial hemorrhage with intraventricular extension and possible developing hydrocephalus Although no fractures identified in the cervical spine, the evaluation of the cervical spine is limited. Critical Value/emergent results were called by telephone at the time of interpretation on 09/29/2015 at 5:22 pm to Dr. Ezequiel Essex , who verbally acknowledged these results. Electronically Signed   By: Skipper Cliche M.D.   On: 09/29/2015 17:22   Ct Cervical Spine Wo Contrast  09/29/2015  CLINICAL DATA:  [place] and hit head last night, somnolence since fall, nonresponsive, pinpoint pupils EXAM: CT HEAD  WITHOUT CONTRAST CT CERVICAL SPINE WITHOUT CONTRAST TECHNIQUE: Multidetector CT imaging of the head and cervical spine was performed following the standard protocol without intravenous contrast. Multiplanar CT image reconstructions of the cervical spine were also generated. COMPARISON:  None. FINDINGS: CT HEAD FINDINGS There is an acute hemorrhage centered on the right thalamus measuring about 3 cm. Blood extends into the third ventricle and layers dependently in both occipital horns as well as in the fourth ventricle. Mild prominence of the ventricles including the temporal horns. No extra-axial fluid collection identified. No skull fracture identified. Scattered bilateral ethmoid air cell opacification. CT CERVICAL SPINE FINDINGS Evaluation of the lower cervical and upper thoracic spine limited by motion. Orogastric and endotracheal tubes visualized in part. No acute soft tissue abnormality visualized. Visualized portion lung apices clear. Reversed lordosis centered at the mid to lower cervical spine appears to be due to degenerative disc disease. There is degenerative disc disease of moderate severity at C3-4, C4-5, and C5-6. No definite fracture identified. IMPRESSION: Acute intracranial hemorrhage with intraventricular extension and possible developing hydrocephalus Although no fractures identified in the cervical spine, the evaluation of the cervical spine is limited. Critical Value/emergent results were called by telephone at the time of interpretation on 09/29/2015 at 5:22 pm to Dr.  Ezequiel Essex , who verbally acknowledged these results. Electronically Signed   By: Skipper Cliche M.D.   On: 09/29/2015 17:22   Dg Chest Port 1 View  09/30/2015  CLINICAL DATA:  Acute respiratory failure with hypoxia. Central line placement. EXAM: PORTABLE CHEST 1 VIEW COMPARISON:  09/29/2015 FINDINGS: Endotracheal tube with tip measuring 4.5 cm above the carina. Enteric tube tip is off the field of view but below the left  hemidiaphragm. I do not see a radiopaque central venous catheter within the field of view. Superimposed EKG wires could obscure visualization of the catheter. Normal heart size and pulmonary vascularity. Atelectasis noted in the lung bases. No focal consolidation. No pneumothorax. Calcification of the aorta. IMPRESSION: Appliances appear in satisfactory position. No central venous catheter is identified. Atelectasis in the lung bases. Electronically Signed   By: Lucienne Capers M.D.   On: 09/30/2015 02:33   Dg Chest Portable 1 View  09/29/2015  CLINICAL DATA:  Adjusting placement of endotracheal tube. Patient fell last night striking head. Cardiomyopathy. EXAM: PORTABLE CHEST 1 VIEW COMPARISON:  09/29/2015 FINDINGS: Endotracheal tube is been repositioned with tip now measuring 5 cm above the carina. Enteric tube tip is off the field of view but below the left hemidiaphragm. Linear atelectasis in the left lung base. Heart size and pulmonary vascularity are normal. No blunting of costophrenic angles. No pneumothorax. Calcification of the aorta. Prominent nipple shadow over the right lung base. IMPRESSION: Appliances in satisfactory position. Atelectasis in the left lung base. Electronically Signed   By: Lucienne Capers M.D.   On: 09/29/2015 22:51   Dg Chest Portable 1 View  09/29/2015  CLINICAL DATA:  Pt fell last night and is unresponsive. Post intubation and ng tube placement EXAM: PORTABLE CHEST 1 VIEW COMPARISON:  04/07/2015 FINDINGS: Endotracheal tube extends just into the right mainstem bronchus. Recommend retracting 2 cm. Nasal/orogastric tube passes well below the diaphragm into the distal stomach. Cardiac silhouette normal in size and configuration. Normal mediastinal and hilar contours. Clear lungs. No apparent pleural effusion and no gross pneumothorax on this supine study. Bony thorax is intact. IMPRESSION: 1. Endotracheal tube tip extends just into the right mainstem bronchus. This will need  retracted 2 cm for optimal positioning. Critical Value/emergent results were called by telephone at the time of interpretation on 09/29/2015 at 3:33 pm to Dr. Ezequiel Essex , who verbally acknowledged these results. 2. Well-positioned nasal/orogastric tube. 3. No acute cardiopulmonary disease. Electronically Signed   By: Lajean Manes M.D.   On: 09/29/2015 15:33     STUDIES:  CT Head W/O 6/15: Acute intracranial hemorrhage with intraventricular extension and possible developing hydrocephalus Although no fractures identified in the cervical spine, the evaluation of the cervical spine is limited. CT Neck 6/15: Limited evaluation of cervical spine due to motion. No fractures evident. Port CXR 6/15: Silhouetting left hemidiaphragm. Endotracheal tube tip at the level of the carina to nearly entering right mainstem bronchus. Orogastric tube passing below diaphragm. Port CXR 6/16>>ETT 4.5 cm above carina. Atelectasis in lung bases  MICROBIOLOGY: MRSA PCR 6/15 >> (-) Blood Ctx x2 6/15 >>  ANTIBIOTICS: 6/15 Rocephin >> 6/16 Acef (prophylactic abx) >>  SIGNIFICANT EVENTS: 6/15 - Admit to ICU on ventilator for ICH after transfer from [facility] ED  LINES/TUBES: OETT 7.5 6/15 >> Foley 6/15 >> OGT 6/15 >> PIV X3 IVC Drain 6/16 >>   DISCUSSION: 60 year old female past medical history of coronary artery disease, ischemic cardiomyopathy, and polysubstance abuse being admitted with right thalamic intracranial  hemorrhage. She is intubated for airway protection and was transferred to [facility] for ICU admission. Will maintain on ventilator with tight blood pressure control with Cardene infusion. Neurology and neurosurgery consulted.  IVC drain placed early am 6/16.    ASSESSMENT / PLAN:  PULMONARY A: Acute Hypoxemic resp failure 2/2 Unable to Protect Airway - Secondary to [place].  P:   Full ventilator support, 8cc/kg Wean O2 for sats > 94% PCXR in am Aspiration precautions  Defer WUA/SBT to neuro. SBT  when able.   CARDIOVASCULAR A:  Chronic Systolic CHF - EF 123456 w/ grade 1 diastolic dysfunction. RV normal. H/O Hypertension H/O CAD - S/P PCI in 2015 H/O Hyperlipidemia  P:  Telemetry monitoring in ICU Tight BP control with SBP goal < 152mmHg No beta blocker as positive for cocaine Cardene gtt for BP control Lipitor VT daily Holding home losartan, carvedilol, plavix, and HCTZ WOF pulm edema  RENAL A:   Metabolic Acidosis - Mild.  P:   Monitor renal function and UOP Strict I&O Correct electrolytes as indicated LR @ 100 ml/hr   GASTROINTESTINAL A:   H/O GERD  P:   NPO Pepcid IV QD Begin TF   HEMATOLOGIC A:   No acute issues.  P:  Trending cell counts daily w/ CBC SCDs  No chemical DVT ppx given ICH  INFECTIOUS A:   No acute evidence of infection. H/O HSV Infection  P:   Follow WBC and fever curve Prophylactic Abx for Ventriculostomy  ENDOCRINE A:   No acute issues  P:   Follow CBG on daily BMP  NEUROLOGIC A:   Right Thalamic Intracranial Hemorrhage s/p IVC 6/16 Cocaine Abuse - urine drug screen positive for cocaine on admission H/O EtOH Abuse H/O Depression  P:   RASS goal: 0 Neurology Consult Neurosurgery Consult Propofol gtt Fentanyl IV prn Blood pressure control as above   FAMILY  - Updates: Family updated at bedside 6/16.  - Inter-disciplinary family meet or Palliative Care meeting due by:  6/22   Noe Gens, NP-C [facility] Pgr: [phone] or if no answer [phone] 09/30/2015, 10:19 AM    ATTENDING NOTE / ATTESTATION NOTE :   I have discussed the case with the resident/APP Noe Gens.  I agree with the resident/APP's  history, physical examination, assessment, and plans.  I have edited the above note and modified it according to our agreed history, physical examination, assessment and plan.   I have spent 31  minutes of critical care time with this patient today.  Family :Pt is married and has a  boyfriend. Has 3 children and Melissa (daughter) is the one making decision for pt.  I updated Melissa.    Monica Becton, MD 09/30/2015, 11:10 AM [facility] Pager [phone] After 3 pm or if no answer, call [phone]

## 2015-09-30 NOTE — Progress Notes (Signed)
No acute events No eye opening PERRL Purposeful on right, dense hemiparesis on left CTA negative for vascular malformation, drain in good place and vents decompressed Stable Continue CSF diversion

## 2015-09-30 NOTE — Consult Note (Signed)
CC:  Chief Complaint  Patient presents with  . Altered Mental Status    HPI: Joanna Farmer is a 60 y.o. female with multiple medical comorbidities who presented to [facility] emergency department 6/15 with altered mental status. She fell last night and has been somnolent since. She was intubated in the ED due to unresponsiveness. She was found to have a right thalamic hemorrhage and was transferred to [facility] for further evaluation and management.  PMH: Past Medical History  Diagnosis Date  . Essential hypertension   . Depression   . Hyperlipidemia with target LDL less than 70   . Alcohol abuse   . History of cocaine use   . Herpes simplex virus (HSV) infection 2016    Antibodies  . GERD (gastroesophageal reflux disease)   . Pneumonia 01/2015  . HSV (herpes simplex virus) infection     hx/notes 03/02/2015  . CAD S/P RCA (in 2015 then 03/16/2015) 03/17/2015    a. Reportedly 2 stents placed [place], [name]; cath 03/16/2015 Mid RCA to Dist RCA lesion, 75%-90% stenosed. Moderate to severe disease in the proximal to mid RCA. Post intervention with overlapping drug-eluting stents, 2.5 x 38 mmSynergy and 2.75 x 26mm Synergy- postdilated to 3.3 mm, there is a 0% residual stenosis.   [name] History of MI (myocardial infarction)     "they say I've had one sometime down the line" (03/16/2015)  . Cardiomyopathy, ischemic - suggest prior Anterior & Inferio MI     Echo [id number]: Systolic function was severely reduced. EF~ 20%. ~Grade 1 diastolic dysfunction. Severe HK of entire anterior myocardium; Mod HK of the basal-mid anteroseptal, mid inferolateral, and mid anterolateral myocardium; mild HK of the mid inferoseptal and basal inferolateral myocardium.     PSH: Past Surgical History  Procedure Laterality Date  . Tubal ligation  1983  . Cardiac catheterization N/A 03/16/2015    Procedure: Right/Left Heart Cath and Coronary Angiography;  Surgeon: Jettie Booze, MD;  Location: [place] [facility]  LAB;  Service: Cardiovascular;  Laterality: N/A;  . Cardiac catheterization Right 03/16/2015    Procedure: Coronary Stent Intervention;  Surgeon: Jettie Booze, MD;  Location: [place] [facility];  Service: Cardiovascular;  Laterality: Right;  . Coronary angioplasty with stent placement  2015    `2 stents"  . Colonoscopy N/A 06/15/2015    Procedure: Colonoscopy ;  Surgeon: Rogene Houston, MD;  Location: [facility] SUITE;  Service: Endoscopy;  Laterality: N/A;  930-moved to 06/15/15 @ 7:30 - Ann notified pt    SH: Social History  Substance Use Topics  . Smoking status: Current Every Day Smoker -- 0.12 packs/day for 32 years    Types: Cigarettes  . Smokeless tobacco: Never Used  . Alcohol Use: 11.4 oz/week    0 Standard drinks or equivalent, 19 Cans of beer per week     Comment: 03/16/2015 "2, 16oz beers/day"    MEDS: Prior to Admission medications   Medication Sig Start Date End Date Taking? Authorizing Provider  atorvastatin (LIPITOR) 80 MG tablet Take 1 tablet (80 mg total) by mouth daily at 6 PM. 03/17/15  Yes Almyra Deforest, PA  losartan (COZAAR) 50 MG tablet Take 1 tablet (50 mg total) by mouth daily. 01/18/15  Yes Satira Sark, MD  potassium chloride 20 MEQ TBCR Take 20 mEq by mouth daily. 04/08/15  Yes Rosita Fire, MD  aspirin 81 MG tablet Take 81 mg by mouth daily.    Historical Provider, MD  carvedilol (COREG)  6.25 MG tablet Take 1 tablet (6.25 mg total) by mouth 2 (two) times daily. 03/02/15   Satira Sark, MD  clopidogrel (PLAVIX) 75 MG tablet Take 1 tablet (75 mg total) by mouth daily. 03/17/15   Almyra Deforest, PA  hydrochlorothiazide (HYDRODIURIL) 12.5 MG tablet Take 1 tablet (12.5 mg total) by mouth daily. 11/25/14   Kristen N Ward, DO  nitroGLYCERIN (NITROSTAT) 0.4 MG SL tablet Place 1 tablet (0.4 mg total) under the tongue every 5 (five) minutes as needed for chest pain. 03/17/15   Almyra Deforest, PA    ALLERGY: No Known Allergies  ROS: ROS  NEUROLOGIC  EXAM: Intubated Pupils small and sluggish + corneal, absent gag or cough Withdraws weakly, R>L  IMAGING: CT Head: Right thalamic hemorrhage with hydrocephalus  IMPRESSION: - 60 y.o. female with right thalamic hemorrhage and non-communicating hydrocephalus.  She has poor neurological function.  PLAN: - An EVD was placed emergently.  This was discussed with family.  Risks and benefits were discussed.  The patient is on plavix and there is no reliable way to reverse it.  This increases the risk of hemorrhage. - Check CT in AM - Keep SBP < 140

## 2015-09-30 NOTE — Op Note (Signed)
*   No surgery found *  2:25 AM  PATIENT:  Joanna Farmer  60 y.o. female  PRE-OPERATIVE DIAGNOSIS:  Right thalamic hemorrhage, non-communicating hydrocephalus  POST-OPERATIVE DIAGNOSIS:  Same  PROCEDURE:  Right frontal external ventricular drain placement  SURGEON:  Aldean Ast, MD  ASSISTANTS: None  ANESTHESIA:   General  DRAINS: Right frontal EVD   SPECIMEN:  None  INDICATION FOR PROCEDURE: Patient on plavix with right thalamic hemorrhage, hydrocephalus, and marked neurologic dysfunction.  I recommended EVD placement. Patient's family understood the risks, benefits, and alternatives and potential outcomes and wished to proceed.  PROCEDURE DETAILS: The right frontal area was clipped and prepped in the usual sterile fashion.  Drapes were applied.  Local anesthetic was injected at [facility] point.  A linear incision was made.  A burr hole was drilled.  Bone chips were irrigated away.  The dura was incised sharply.  A ventricular catheter was inserted perpendicular to the skull.  There was return of spinal fluid at 6.5 cm at the skull.  It was tunneled posteromedially and secured.  The incision was stapled.  A sterile dressing was applied.  PATIENT DISPOSITION:  PACU - hemodynamically stable.   Delay start of Pharmacological VTE agent (>24hrs) due to surgical blood loss or risk of bleeding:  yes

## 2015-09-30 NOTE — Progress Notes (Signed)
STROKE TEAM PROGRESS NOTE   HISTORY OF PRESENT ILLNESS (per record) Joanna Farmer is a 60 y.o. female with history of hypertension,alcohol and cocaine abuse, coronary artery disease initially presented with altered mental status and fall was found to have a right basal ganglia hypertensive hmg. She presented to [facility] where CT scan of the head showed an acute intracranial hemorrhage involving the right basal ganglia with intraventricular extension and possible developing hydrocephalus. She was intubated for airway protection and subsequently transferred to [facility]. Neurosurgery placed IVC. Currently status post ventriculostomy, intubated and sedated. Patient was not considered for IV t-PA secondary to hmg. She was admitted to the neuro ICU for further evaluation and treatment.   SUBJECTIVE (INTERVAL HISTORY) No family is at the bedside initially and boyfriend arrived later.  Her RN is present. She is lying the the bed, intubated. Can follow commands off Propofol. IVC in place and draining.    OBJECTIVE Temp:  [98.1 F (36.7 C)-101.3 F (38.5 C)] 99.1 F (37.3 C) (06/16 0833) Pulse Rate:  [59-115] 66 (06/16 0833) Cardiac Rhythm:  [-]  Resp:  [12-21] 12 (06/16 0833) BP: (95-248)/(51-119) 95/56 mmHg (06/16 0833) SpO2:  [97 %-100 %] 100 % (06/16 0833) FiO2 (%):  [40 %-50 %] 40 % (06/16 0833) Weight:  [46.7 kg (102 lb 15.3 oz)-47.628 kg (105 lb)] 46.7 kg (102 lb 15.3 oz) (06/16 0500)  CBC:  Recent Labs Lab 09/29/15 1500 09/29/15 1506 09/30/15 0430  WBC 7.4  --  6.5  NEUTROABS 6.1  --   --   HGB 13.5 14.3 10.5*  HCT 39.1 42.0 32.3*  MCV 96.5  --  95.6  PLT 211  --  [id number]    Basic Metabolic Panel:  Recent Labs Lab 09/29/15 1500 09/29/15 1506 09/30/15 0430  NA 135 140 138  K 4.0 4.0 3.1*  CL 105 107 111  CO2 20*  --  19*  GLUCOSE 102* 99 121*  BUN 16 14 11   CREATININE 0.92 0.90 0.85  CALCIUM 9.2  --  8.1*  MG  --   --  2.0  PHOS  --   --  3.1    Lipid Panel:     Component Value Date/Time   TRIG 127 09/30/2015 0358   HgbA1c: No results found for: HGBA1C Urine Drug Screen:    Component Value Date/Time   LABOPIA NONE DETECTED 09/29/2015 1510   COCAINSCRNUR POSITIVE* 09/29/2015 1510   LABBENZ NONE DETECTED 09/29/2015 1510   AMPHETMU NONE DETECTED 09/29/2015 1510   THCU NONE DETECTED 09/29/2015 1510   LABBARB NONE DETECTED 09/29/2015 1510      IMAGING  Ct Head Wo Contrast  09/29/2015  CLINICAL DATA:  [place] and hit head last night, somnolence since fall, nonresponsive, pinpoint pupils EXAM: CT HEAD WITHOUT CONTRAST CT CERVICAL SPINE WITHOUT CONTRAST TECHNIQUE: Multidetector CT imaging of the head and cervical spine was performed following the standard protocol without intravenous contrast. Multiplanar CT image reconstructions of the cervical spine were also generated. COMPARISON:  None. FINDINGS: CT HEAD FINDINGS There is an acute hemorrhage centered on the right thalamus measuring about 3 cm. Blood extends into the third ventricle and layers dependently in both occipital horns as well as in the fourth ventricle. Mild prominence of the ventricles including the temporal horns. No extra-axial fluid collection identified. No skull fracture identified. Scattered bilateral ethmoid air cell opacification. CT CERVICAL SPINE FINDINGS Evaluation of the lower cervical and upper thoracic spine limited by motion. Orogastric and endotracheal tubes visualized  in part. No acute soft tissue abnormality visualized. Visualized portion lung apices clear. Reversed lordosis centered at the mid to lower cervical spine appears to be due to degenerative disc disease. There is degenerative disc disease of moderate severity at C3-4, C4-5, and C5-6. No definite fracture identified. IMPRESSION: Acute intracranial hemorrhage with intraventricular extension and possible developing hydrocephalus Although no fractures identified in the cervical spine, the evaluation of the cervical spine is  limited. Critical Value/emergent results were called by telephone at the time of interpretation on 09/29/2015 at 5:22 pm to Dr. Ezequiel Essex , who verbally acknowledged these results. Electronically Signed   By: Skipper Cliche M.D.   On: 09/29/2015 17:22   Ct Cervical Spine Wo Contrast  09/29/2015  CLINICAL DATA:  [place] and hit head last night, somnolence since fall, nonresponsive, pinpoint pupils EXAM: CT HEAD WITHOUT CONTRAST CT CERVICAL SPINE WITHOUT CONTRAST TECHNIQUE: Multidetector CT imaging of the head and cervical spine was performed following the standard protocol without intravenous contrast. Multiplanar CT image reconstructions of the cervical spine were also generated. COMPARISON:  None. FINDINGS: CT HEAD FINDINGS There is an acute hemorrhage centered on the right thalamus measuring about 3 cm. Blood extends into the third ventricle and layers dependently in both occipital horns as well as in the fourth ventricle. Mild prominence of the ventricles including the temporal horns. No extra-axial fluid collection identified. No skull fracture identified. Scattered bilateral ethmoid air cell opacification. CT CERVICAL SPINE FINDINGS Evaluation of the lower cervical and upper thoracic spine limited by motion. Orogastric and endotracheal tubes visualized in part. No acute soft tissue abnormality visualized. Visualized portion lung apices clear. Reversed lordosis centered at the mid to lower cervical spine appears to be due to degenerative disc disease. There is degenerative disc disease of moderate severity at C3-4, C4-5, and C5-6. No definite fracture identified. IMPRESSION: Acute intracranial hemorrhage with intraventricular extension and possible developing hydrocephalus Although no fractures identified in the cervical spine, the evaluation of the cervical spine is limited. Critical Value/emergent results were called by telephone at the time of interpretation on 09/29/2015 at 5:22 pm to Dr. Ezequiel Essex , who verbally acknowledged these results. Electronically Signed   By: Skipper Cliche M.D.   On: 09/29/2015 17:22   Dg Chest Port 1 View  09/30/2015  CLINICAL DATA:  Acute respiratory failure with hypoxia. Central line placement. EXAM: PORTABLE CHEST 1 VIEW COMPARISON:  09/29/2015 FINDINGS: Endotracheal tube with tip measuring 4.5 cm above the carina. Enteric tube tip is off the field of view but below the left hemidiaphragm. I do not see a radiopaque central venous catheter within the field of view. Superimposed EKG wires could obscure visualization of the catheter. Normal heart size and pulmonary vascularity. Atelectasis noted in the lung bases. No focal consolidation. No pneumothorax. Calcification of the aorta. IMPRESSION: Appliances appear in satisfactory position. No central venous catheter is identified. Atelectasis in the lung bases. Electronically Signed   By: Lucienne Capers M.D.   On: 09/30/2015 02:33   Dg Chest Portable 1 View  09/29/2015  CLINICAL DATA:  Adjusting placement of endotracheal tube. Patient fell last night striking head. Cardiomyopathy. EXAM: PORTABLE CHEST 1 VIEW COMPARISON:  09/29/2015 FINDINGS: Endotracheal tube is been repositioned with tip now measuring 5 cm above the carina. Enteric tube tip is off the field of view but below the left hemidiaphragm. Linear atelectasis in the left lung base. Heart size and pulmonary vascularity are normal. No blunting of costophrenic angles. No pneumothorax. Calcification of the  aorta. Prominent nipple shadow over the right lung base. IMPRESSION: Appliances in satisfactory position. Atelectasis in the left lung base. Electronically Signed   By: Lucienne Capers M.D.   On: 09/29/2015 22:51   Dg Chest Portable 1 View  09/29/2015  CLINICAL DATA:  Pt fell last night and is unresponsive. Post intubation and ng tube placement EXAM: PORTABLE CHEST 1 VIEW COMPARISON:  04/07/2015 FINDINGS: Endotracheal tube extends just into the right  mainstem bronchus. Recommend retracting 2 cm. Nasal/orogastric tube passes well below the diaphragm into the distal stomach. Cardiac silhouette normal in size and configuration. Normal mediastinal and hilar contours. Clear lungs. No apparent pleural effusion and no gross pneumothorax on this supine study. Bony thorax is intact. IMPRESSION: 1. Endotracheal tube tip extends just into the right mainstem bronchus. This will need retracted 2 cm for optimal positioning. Critical Value/emergent results were called by telephone at the time of interpretation on 09/29/2015 at 3:33 pm to Dr. Ezequiel Essex , who verbally acknowledged these results. 2. Well-positioned nasal/orogastric tube. 3. No acute cardiopulmonary disease. Electronically Signed   By: Lajean Manes M.D.   On: 09/29/2015 15:33    PHYSICAL EXAM Frail petite african american lady not in distress. She is intubated and on ventilator. Afebrile. Head is nontraumatic. Neck is supple without bruit.    Cardiac exam no murmur or gallop. Lungs are clear to auscultation. Distal pulses are well felt. Neurological Exam :  Patient is sedated on propofol drip and intubated. She can be aroused. Left and downward gaze deviation. Right pupil is 2 mm sluggishly reactive and left pupils 4 mm nonreactive. Corneal reflexes are present. Fundi could not be visualized. Patient follows midline commands and commands on the right side consistently. Left lower facial weakness. Dense left hemiplegia. Withdraws left lower extremity to pain but not left upper extremity. Purposeful antigravity movements on the right side. Right plantar downgoing left upgoing. Gait was not tested. ASSESSMENT/PLAN Ms. CHAVONNE CHAVANA is a 60 y.o. female with history of hypertension, alcohol abuse, cocaine abuse, HSV, coronary artery disease status post stents to RCA and LAD in 2015, and ischemic cardiomyopathy with LV EF 35-40% presenting to [facility] w/ altered mental status, L gaze preference. CT with right  basal ganglia hemorrhage, felt to be hypertensive. S/p IVC, intubated in the neuro ICU at [facility].  Stroke:  Non-dominant right hypertensive basal ganglia hemorrhage  Resultant  VDRF. Left hemiplegia  Changed CT head to CTA head and neck pending   Check 2-D echo. Patient with history of EF 20% in 2016  SCDs for VTE prophylaxis  Diet NPO time specified  aspirin 81 mg daily and clopidogrel 75 mg daily prior to admission  IVC intact, draining  Ongoing aggressive stroke risk factor management  Therapy recommendations:  pending   Disposition:  pending   Acute respiratory failure   Secondary to hemorrhage  Intubated for neuro protection  Hypertensive Emergency  Blood pressure as high as 248/119 on arrival in setting of neurologic deficits  Started on Cardene drip  Hyperlipidemia  Home meds:  Lipitor 80  Given hemorrhage, statin for now  Other Stroke Risk Factors  History of alcohol and cocaine abuse  Cigarette smoker  ETOH use  Cocaine use, UDS positive for admission  Coronary artery disease - stent placement [id number]  Chronic systolic congestive heart failure with known EF 20%  Other Active Problems  Metabolic acidosis, mild  GERD on Pepcid  H/O HSV infection  H/O depression  Hypokalemia, replaced  Hospital day #  [place] for Pager information 09/30/2015 11:52 AM  I have personally examined this patient, reviewed notes, independently viewed imaging studies, participated in medical decision making and plan of care. I have made any additions or clarifications directly to the above note. Agree with note above. She presented with right basal ganglia intraventricular extension and hydrocephalus requiring emergent intubation and ventriculostomy. She remains at risk for neurological worsening, hematoma expansion and needs strict blood pressure control and close neurological monitoring. Plan to check repeat CT scan with CT  angiogram today. Discussed with the boyfriend at the bedside and answered questions This patient is critically ill and at significant risk of neurological worsening, death and care requires constant monitoring of vital signs, hemodynamics,respiratory and cardiac monitoring, extensive review of multiple databases, frequent neurological assessment, discussion with family, other specialists and medical decision making of high complexity.I have made any additions or clarifications directly to the above note.This critical care time does not reflect procedure time, or teaching time or supervisory time of PA/NP/Med Resident etc but could involve care discussion time.  I spent 35 minutes of neurocritical care time  in the care of  this patient.     Antony Contras, MD Medical Director [facility] Pager: [phone] 09/30/2015 2:55 PM    To contact Stroke Continuity provider, please refer to [email or web address] After hours, contact General Neurology

## 2015-10-01 ENCOUNTER — Inpatient Hospital Stay (HOSPITAL_COMMUNITY): Payer: Medicaid Other

## 2015-10-01 DIAGNOSIS — G936 Cerebral edema: Secondary | ICD-10-CM

## 2015-10-01 DIAGNOSIS — I6789 Other cerebrovascular disease: Secondary | ICD-10-CM

## 2015-10-01 DIAGNOSIS — I10 Essential (primary) hypertension: Secondary | ICD-10-CM

## 2015-10-01 LAB — ECHOCARDIOGRAM COMPLETE
AVLVOTPG: 8 mmHg
CHL CUP RV SYS PRESS: 26 mmHg
CHL CUP STROKE VOLUME: 42 mL
EERAT: 14.91
EWDT: 225 ms
FS: 33 % (ref 28–44)
HEIGHTINCHES: 63 in
IVS/LV PW RATIO, ED: 1.1
LA ID, A-P, ES: 29 mm
LA vol A4C: 27.8 ml
LA vol index: 22 mL/m2
LADIAMINDEX: 2.03 cm/m2
LAVOL: 31.5 mL
LEFT ATRIUM END SYS DIAM: 29 mm
LV E/e' medial: 14.91
LV SIMPSON'S DISK: 66
LV TDI E'LATERAL: 5.11
LV TDI E'MEDIAL: 6.74
LV dias vol index: 44 mL/m2
LV dias vol: 63 mL (ref 46–106)
LV e' LATERAL: 5.11 cm/s
LV sys vol index: 15 mL/m2
LVEEAVG: 14.91
LVOT SV: 70 mL
LVOT VTI: 27.4 cm
LVOT area: 2.54 cm2
LVOT diameter: 18 mm
LVOT peak vel: 145 cm/s
LVSYSVOL: 22 mL (ref 14–42)
MV Dec: 225
MV Peak grad: 2 mmHg
MVPKAVEL: 86.3 m/s
MVPKEVEL: 76.2 m/s
PW: 12.6 mm — AB (ref 0.6–1.1)
RV LATERAL S' VELOCITY: 11.2 cm/s
Reg peak vel: 239 cm/s
TAPSE: 17.2 mm
TRMAXVEL: 239 cm/s
WEIGHTICAEL: 1636.7 [oz_av]

## 2015-10-01 LAB — CBC
HEMATOCRIT: 32 % — AB (ref 36.0–46.0)
HEMOGLOBIN: 10.3 g/dL — AB (ref 12.0–15.0)
MCH: 31.2 pg (ref 26.0–34.0)
MCHC: 32.2 g/dL (ref 30.0–36.0)
MCV: 97 fL (ref 78.0–100.0)
Platelets: 143 10*3/uL — ABNORMAL LOW (ref 150–400)
RBC: 3.3 MIL/uL — ABNORMAL LOW (ref 3.87–5.11)
RDW: 13.5 % (ref 11.5–15.5)
WBC: 6.8 10*3/uL (ref 4.0–10.5)

## 2015-10-01 LAB — GLUCOSE, CAPILLARY
GLUCOSE-CAPILLARY: 126 mg/dL — AB (ref 65–99)
GLUCOSE-CAPILLARY: 130 mg/dL — AB (ref 65–99)
GLUCOSE-CAPILLARY: 132 mg/dL — AB (ref 65–99)
Glucose-Capillary: 123 mg/dL — ABNORMAL HIGH (ref 65–99)
Glucose-Capillary: 102 mg/dL — ABNORMAL HIGH (ref 65–99)
Glucose-Capillary: 112 mg/dL — ABNORMAL HIGH (ref 65–99)

## 2015-10-01 LAB — BASIC METABOLIC PANEL
Anion gap: 7 (ref 5–15)
BUN: 11 mg/dL (ref 6–20)
CALCIUM: 9.2 mg/dL (ref 8.9–10.3)
CHLORIDE: 107 mmol/L (ref 101–111)
CO2: 27 mmol/L (ref 22–32)
CREATININE: 0.69 mg/dL (ref 0.44–1.00)
GFR calc non Af Amer: >60 mL/min (ref >60)
GLUCOSE: 107 mg/dL — AB (ref 65–99)
Potassium: 3.3 mmol/L — ABNORMAL LOW (ref 3.5–5.1)
Sodium: 141 mmol/L (ref 135–145)

## 2015-10-01 LAB — LIPID PANEL
Cholesterol: 134 mg/dL (ref 0–200)
HDL: 77 mg/dL (ref >40)
LDL CALC: 33 mg/dL (ref 0–99)
TRIGLYCERIDES: 120 mg/dL (ref <150)
Total CHOL/HDL Ratio: 1.7 RATIO
VLDL: 24 mg/dL (ref 0–40)

## 2015-10-01 LAB — TSH: TSH: 1.1 u[IU]/mL (ref 0.350–4.500)

## 2015-10-01 LAB — VITAMIN B12: Vitamin B-12: 328 pg/mL (ref 180–914)

## 2015-10-01 LAB — MAGNESIUM
Magnesium: 2 mg/dL (ref 1.7–2.4)
Magnesium: 1.9 mg/dL (ref 1.7–2.4)

## 2015-10-01 LAB — HIV ANTIBODY (ROUTINE TESTING W REFLEX): HIV SCREEN 4TH GENERATION: NONREACTIVE

## 2015-10-01 LAB — PHOSPHORUS
Phosphorus: 2.8 mg/dL (ref 2.5–4.6)
Phosphorus: 1.9 mg/dL — ABNORMAL LOW (ref 2.5–4.6)

## 2015-10-01 LAB — RPR: RPR: NONREACTIVE

## 2015-10-01 MED ORDER — CARVEDILOL 3.125 MG PO TABS
6.2500 mg | ORAL_TABLET | Freq: Two times a day (BID) | ORAL | Status: DC
  Administered 2015-10-02 – 2015-10-09 (×14): 6.25 mg via ORAL
  Filled 2015-10-01 (×14): qty 2

## 2015-10-01 MED ORDER — PANTOPRAZOLE SODIUM 40 MG PO TBEC: 40.0000 mg | DELAYED_RELEASE_TABLET | Freq: Every day | ORAL | Status: DC

## 2015-10-01 MED ORDER — CLEVIDIPINE BUTYRATE 0.5 MG/ML IV EMUL
0.0000 mg/h | INTRAVENOUS | Status: DC
  Administered 2015-10-01: 6 mg/h via INTRAVENOUS
  Administered 2015-10-01: 8 mg/h via INTRAVENOUS
  Administered 2015-10-01: 1 mg/h via INTRAVENOUS
  Administered 2015-10-01: 8 mg/h via INTRAVENOUS
  Administered 2015-10-02 (×2): 6 mg/h via INTRAVENOUS
  Administered 2015-10-02: 5 mg/h via INTRAVENOUS
  Administered 2015-10-08: 8 mg/h via INTRAVENOUS
  Administered 2015-10-08: 13 mg/h via INTRAVENOUS
  Administered 2015-10-08: 10 mg/h via INTRAVENOUS
  Administered 2015-10-08: 1 mg/h via INTRAVENOUS
  Administered 2015-10-08: 12 mg/h via INTRAVENOUS
  Administered 2015-10-08: 10 mg/h via INTRAVENOUS
  Administered 2015-10-09: 15 mg/h via INTRAVENOUS
  Administered 2015-10-09: 19 mg/h via INTRAVENOUS
  Administered 2015-10-09: 13 mg/h via INTRAVENOUS
  Administered 2015-10-09: 10 mg/h via INTRAVENOUS
  Administered 2015-10-09: 14 mg/h via INTRAVENOUS
  Administered 2015-10-09: 12 mg/h via INTRAVENOUS
  Administered 2015-10-09: 17 mg/h via INTRAVENOUS
  Administered 2015-10-12: 2 mg/h via INTRAVENOUS
  Administered 2015-10-13: 6 mg/h via INTRAVENOUS
  Administered 2015-10-13 (×3): 5 mg/h via INTRAVENOUS
  Administered 2015-10-14: 4 mg/h via INTRAVENOUS
  Administered 2015-10-14 – 2015-10-15 (×2): 5 mg/h via INTRAVENOUS
  Administered 2015-10-15: 8 mg/h via INTRAVENOUS
  Filled 2015-10-01 (×20): qty 50
  Filled 2015-10-01: qty 100
  Filled 2015-10-01 (×3): qty 50
  Filled 2015-10-01: qty 100
  Filled 2015-10-01 (×3): qty 50

## 2015-10-01 MED ORDER — POTASSIUM CHLORIDE 20 MEQ/15ML (10%) PO SOLN
40.0000 meq | Freq: Three times a day (TID) | ORAL | Status: AC
  Administered 2015-10-01 (×2): 40 meq
  Filled 2015-10-01 (×2): qty 30

## 2015-10-01 MED ORDER — HYDROCHLOROTHIAZIDE 25 MG PO TABS
12.5000 mg | ORAL_TABLET | Freq: Every day | ORAL | Status: DC
  Administered 2015-10-01 – 2015-10-04 (×4): 12.5 mg via ORAL
  Filled 2015-10-01 (×5): qty 1

## 2015-10-01 MED ORDER — POTASSIUM PHOSPHATES 15 MMOLE/5ML IV SOLN
10.0000 mmol | Freq: Once | INTRAVENOUS | Status: AC
  Administered 2015-10-01: 10 mmol via INTRAVENOUS
  Filled 2015-10-01: qty 3.33

## 2015-10-01 MED ORDER — PANTOPRAZOLE SODIUM 40 MG PO PACK
40.0000 mg | PACK | Freq: Every day | ORAL | Status: DC
  Administered 2015-10-01 – 2015-10-16 (×15): 40 mg
  Filled 2015-10-01 (×14): qty 20

## 2015-10-01 MED ORDER — POTASSIUM CHLORIDE 20 MEQ/15ML (10%) PO SOLN
40.0000 meq | Freq: Once | ORAL | Status: AC
  Administered 2015-10-01: 40 meq
  Filled 2015-10-01: qty 30

## 2015-10-01 MED ORDER — LOSARTAN POTASSIUM 50 MG PO TABS
50.0000 mg | ORAL_TABLET | Freq: Every day | ORAL | Status: DC
  Administered 2015-10-01 – 2015-10-04 (×4): 50 mg via ORAL
  Filled 2015-10-01 (×5): qty 1

## 2015-10-01 NOTE — Progress Notes (Signed)
[facility] Progress Note Patient Name: PALLAS SCHWENDEMANN DOB: 04/25/1955 MRN: [id number]   Date of Service  10/01/2015  HPI/Events of Note  Hypokalemia  eICU Interventions  Potassium replaced     Intervention Category Intermediate Interventions: Electrolyte abnormality - evaluation and management  DETERDING,ELIZABETH 10/01/2015, 6:07 AM

## 2015-10-01 NOTE — Progress Notes (Signed)
STROKE TEAM PROGRESS NOTE   SUBJECTIVE (INTERVAL HISTORY) Her boyfriend is at the bedside.  Overall she feels her condition is unchanged. She is still intubated on vent, not open eyes but respond to pain vigorously. EVD draining well. On tube feeding. UDS showed cocaine positive. TTE pending.   OBJECTIVE Temp:  [98.1 F (36.7 C)-100.9 F (38.3 C)] 98.1 F (36.7 C) (06/17 0400) Pulse Rate:  [52-119] 78 (06/17 0730) Cardiac Rhythm:  [-] Normal sinus rhythm (06/16 2000) Resp:  [10-33] 16 (06/17 0730) BP: (95-176)/(48-111) 140/58 mmHg (06/17 0730) SpO2:  [100 %] 100 % (06/17 0730) FiO2 (%):  [40 %] 40 % (06/17 0730) Weight:  [46.4 kg (102 lb 4.7 oz)] 46.4 kg (102 lb 4.7 oz) (06/17 0500)  CBC:  Recent Labs Lab 09/29/15 1500  09/30/15 0430 10/01/15 0450  WBC 7.4  --  6.5 6.8  NEUTROABS 6.1  --   --   --   HGB 13.5  < > 10.5* 10.3*  HCT 39.1  < > 32.3* 32.0*  MCV 96.5  --  95.6 97.0  PLT 211  --  174 143*  < > = values in this interval not displayed.  Basic Metabolic Panel:  Recent Labs Lab 09/30/15 0430  09/30/15 1658 10/01/15 0450  NA 138  --   --  141  K 3.1*  --   --  3.3*  CL 111  --   --  107  CO2 19*  --   --  27  GLUCOSE 121*  --   --  107*  BUN 11  --   --  11  CREATININE 0.85  --   --  0.69  CALCIUM 8.1*  --   --  9.2  MG 2.0  < > 1.9 2.0  PHOS 3.1  < > 2.5 2.8  < > = values in this interval not displayed.  Lipid Panel:    Component Value Date/Time   CHOL 134 10/01/2015 0450   TRIG 120 10/01/2015 0450   HDL 77 10/01/2015 0450   CHOLHDL 1.7 10/01/2015 0450   VLDL 24 10/01/2015 0450   LDLCALC 33 10/01/2015 0450   HgbA1c: No results found for: HGBA1C Urine Drug Screen:    Component Value Date/Time   LABOPIA NONE DETECTED 09/30/2015 1816   COCAINSCRNUR POSITIVE* 09/30/2015 1816   LABBENZ POSITIVE* 09/30/2015 1816   AMPHETMU NONE DETECTED 09/30/2015 1816   THCU NONE DETECTED 09/30/2015 1816   LABBARB NONE DETECTED 09/30/2015 1816      IMAGING I  have personally reviewed the radiological images below and agree with the radiology interpretations.  Ct Angio Head and neck W Or Wo Contrast 09/30/2015   1. No evidence of major intracranial arterial occlusion, aneurysm, or flow limiting proximal stenosis.  2. Intracranial atherosclerosis with mild right ICA, mild left M2, and mild bilateral P2 narrowing.  3. Unchanged 3 cm right thalamic hemorrhage. Small volume intraventricular hemorrhage, slightly decreased from prior.  4. 50% proximal right ICA stenosis.  5. Mild proximal right vertebral artery stenosis.   Ct Head Wo Contrast 09/29/2015   Acute intracranial hemorrhage with intraventricular extension and possible developing hydrocephalus Although no fractures identified in the cervical spine, the evaluation of the cervical spine is limited.   Ct Cervical Spine Wo Contrast 09/29/2015   Acute intracranial hemorrhage with intraventricular extension and possible developing hydrocephalus Although no fractures identified in the cervical spine, the evaluation of the cervical spine is limited.   Dg Chest Port 1 View 09/30/2015  Appliances appear in satisfactory position. No central venous catheter is identified. Atelectasis in the lung bases.   TTE - - Left ventricle: The cavity size was mildly dilated. Wall  thickness was increased in a pattern of moderate LVH. Systolic  function was normal. The estimated ejection fraction was in the  range of 60% to 65%. Wall motion was normal; there were no  regional wall motion abnormalities. Doppler parameters are  consistent with both elevated ventricular end-diastolic filling  pressure and elevated left atrial filling pressure. - Atrial septum: No defect or patent foramen ovale was identified.   PHYSICAL EXAM Temp:  [98.1 F (36.7 C)-100.9 F (38.3 C)] 100.3 F (37.9 C) (06/17 1200) Pulse Rate:  [52-119] 67 (06/17 1400) Resp:  [10-33] 13 (06/17 1400) BP: (103-169)/(47-111) 129/51 mmHg  (06/17 1400) SpO2:  [100 %] 100 % (06/17 1400) FiO2 (%):  [40 %] 40 % (06/17 1400) Weight:  [102 lb 4.7 oz (46.4 kg)] 102 lb 4.7 oz (46.4 kg) (06/17 0500)  General - Well nourished, well developed, intubated.  Ophthalmologic - Fundi not visualized due to ET positioning.  Cardiovascular - Regular rate and rhythm.  Neuro - intubated on vent and propofol, eyes not open, not following commands. PERRL, eyes in the middle position, left eye downward disconjugation, facial droop not able to evaluate due to ET tube. Right UE and LE spontaneous movement. On pain stimulation, strong against gravity on the right UE and LE, left UE extension, and LLE 3/5. Babinski positive bilaterally, DTR 1+. Sensation, gait and coordination not tested.   ASSESSMENT/PLAN Joanna Farmer is a 60 y.o. female with history of hypertension, hyperlipidemia, alcohol abuse, cocaine abuse, herpes simplex, coronary artery disease with previous MI and stents, and cardiomyopathy  presenting with altered mental status and a fall. She did not receive IV t-PA due to intracranial hemorrhage.  ICH:  Non-dominant right BG/thalamic/hypothalamic ICH with IVH s/p EVD placement likely secondary to HTN.  Resultant  Left hemiparesis, respiratory failure  MRI MRA not performed  CTA head and neck - stable ICH, right proximal ICA 50% stenosis  2D Echo EF 60-65%  LDL - 33  HgbA1c pending  UDS cocaine positive  VTE prophylaxis - SCDs  Diet NPO time specified  aspirin 81 mg daily and clopidogrel 75 mg daily prior to admission, now on No antithrombotic secondary to hemorrhage.  Ongoing aggressive stroke risk factor management  Therapy recommendations: Pending  Disposition: Pending  Hypertensive emergency  On cleviprex  BP goal < 160  Resume home BP meds  Wean off cleviprex as able  Hyperlipidemia  Home meds: Lipitor 80 mg daily - not resumed in the hospital secondary to [place].  LDL 33, goal < 70  Continue statin  at discharge  Cocaine abuse  UDS positive for cocaine  Hx of cocaine use  Other Stroke Risk Factors  Advanced age  Cigarette smoker - advised to stop smoking  ETOH use, advised to drink no more than 1-2 drinks per day.  Coronary artery disease s/p stent - on home DAPT with high dose lipitor  Cardiomyopathy - EF 20% in 01/2015 but this admission EF 60-65%  Other Active Problems  Anemia - hemoglobin 10.3; hematocrit 32  Hypokalemia - supplemented  Hospital day # 2  This patient is critically ill due to [place] with IVH s/p EVD and hypertension and intubation and at significant risk of neurological worsening, death form hematoma expansion, hydrocephalus, heart failure, cerebral edema and herniation. This patient's care requires constant monitoring  of vital signs, hemodynamics, respiratory and cardiac monitoring, review of multiple databases, neurological assessment, discussion with family, other specialists and medical decision making of high complexity. I spent 40 minutes of neurocritical care time in the care of this patient.  Joanna Hawking, MD PhD Stroke Neurology 10/01/2015 2:34 PM  To contact Stroke Continuity provider, please refer to [email or web address] After hours, contact General Neurology

## 2015-10-01 NOTE — Progress Notes (Signed)
Patient ID: Joanna Farmer, female   DOB: 09/01/1955, 60 y.o.   MRN: [id number] Subjective:  The patient is intubated. Her husband is at her bedside. She is in no apparent distress.  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 She is purposeful on the right and by report occasionally follows commands. Her pupils are equal. Her ventriculostomy is patent.  Lab Results:  Recent Labs  09/30/15 0430 10/01/15 0450  WBC 6.5 6.8  HGB 10.5* 10.3*  HCT 32.3* 32.0*  PLT 174 143*   BMET  Recent Labs  09/30/15 0430 10/01/15 0450  NA 138 141  K 3.1* 3.3*  CL 111 107  CO2 19* 27  GLUCOSE 121* 107*  BUN 11 11  CREATININE 0.85 0.69  CALCIUM 8.1* 9.2    Studies/Results: Ct Angio Head W Or Wo Contrast  09/30/2015  ADDENDUM REPORT: 09/30/2015 13:09 ADDENDUM: This combined head and neck CTA examination was inadvertently reported in full by two different radiologists (Dr. Jeralyn Ruths on the CTA head accession and Dr. Carlis Abbott on the CTA neck accession). The two reports do not substantially differ in content. Electronically Signed   By: Logan Bores M.D.   On: 09/30/2015 13:09  09/30/2015  CLINICAL DATA:  Intracranial hemorrhage.  Fall. EXAM: CT ANGIOGRAPHY HEAD AND NECK TECHNIQUE: Multidetector CT imaging of the head and neck was performed using the standard protocol during bolus administration of intravenous contrast. Multiplanar  CT image reconstructions and MIPs were obtained to evaluate the vascular anatomy. Carotid stenosis measurements (when applicable) are obtained utilizing NASCET criteria, using the distal internal carotid diameter as the denominator. CONTRAST:  50 mL Isovue 370 COMPARISON:  Noncontrast head CT 09/29/2015 FINDINGS: CTA NECK Aortic arch: Normal variant aortic arch branching with common origin of the brachiocephalic and left subclavian arteries. Mild-to-moderate atheromatous plaque in the aortic arch and left subclavian artery without significant stenosis. Right carotid system: Moderate plaque at the carotid bifurcation results in approximately 50% proximal ICA stenosis. Left carotid system: Mild-to-moderate plaque throughout the common carotid artery without significant stenosis. Moderate plaque involving the carotid bifurcation and proximal ICA without significant stenosis. Vertebral arteries: The vertebral arteries are patent and codominant. There is mild proximal right vertebral artery stenosis, and there is minimal to mild right V2 narrowing at the C4 and C5 levels. No significant left vertebral artery stenosis. Skeleton: Multilevel cervical disc degeneration with degenerative endplate irregularity and prominent sclerosis throughout much of the C4 and C5 vertebral bodies Other neck: Partially visualized enteric tube. Endotracheal tube terminates well above the carina. Associated retained secretions in the pharynx. Scattered venous gas likely iatrogenic. Prominent lucency posteriorly in the right maxillary alveolar ridge associated with the roots of the first and third molars. Right maxillary second molar is absent. CTA HEAD Anterior circulation: The internal carotid arteries are patent from skullbase to carotid termini. There is moderate right and mild left carotid siphon atherosclerosis with mild cavernous stenosis on the right. The right A1 segment  is severely hypoplastic or absent. Left A1 segment is widely  patent and supplies the right A2. MCAs are patent without evidence of major branch occlusion or significant proximal stenosis. Mild ACA and MCA branch vessel irregularity is noted bilaterally, and there is mild proximal left M2 superior division stenosis. No intracranial aneurysm is identified. Posterior circulation: Intracranial vertebral arteries are widely patent to the basilar. AICA and SCA origins are patent. Basilar artery is widely patent. Posterior communicating arteries are not identified. PCAs are patent with mild irregularity bilaterally including mild bilateral P2 segment narrowing but no flow limiting proximal stenosis. Venous sinuses: Patent. Anatomic variants: Absent right A1. Delayed phase: Interval right frontal approach external ventricular drain placement which courses through the right lateral ventricle and terminates in the midline near the foramen of Monro. Lateral ventricles have decreased in size compared to the prior CT. Approximately 3 cm hemorrhage centered in the right thalamus is unchanged. Small volume hemorrhage in the left lateral ventricle is similar to the prior CT. Hemorrhage in the fourth ventricle has decreased. No abnormal enhancement is identified. IMPRESSION: 1. No evidence of major intracranial arterial occlusion, aneurysm, or flow limiting proximal stenosis. 2. Intracranial atherosclerosis with mild right ICA, mild left M2, and mild bilateral P2 narrowing. 3. Unchanged 3 cm right thalamic hemorrhage. Small volume intraventricular hemorrhage, slightly decreased from prior. 4. 50% proximal right ICA stenosis. 5. Mild proximal right vertebral artery stenosis. Electronically Signed: By: Logan Bores M.D. On: 09/30/2015 11:59   Ct Head Wo Contrast  09/29/2015  CLINICAL DATA:  [place] and hit head last night, somnolence since fall, nonresponsive, pinpoint pupils EXAM: CT HEAD WITHOUT CONTRAST CT CERVICAL SPINE WITHOUT CONTRAST TECHNIQUE: Multidetector CT imaging of the head and  cervical spine was performed following the standard protocol without intravenous contrast. Multiplanar CT image reconstructions of the cervical spine were also generated. COMPARISON:  None. FINDINGS: CT HEAD FINDINGS There is an acute hemorrhage centered on the right thalamus measuring about 3 cm. Blood extends into the third ventricle and layers dependently in both occipital horns as well as in the fourth ventricle. Mild prominence of the ventricles including the temporal horns. No extra-axial fluid collection identified. No skull fracture identified. Scattered bilateral ethmoid air cell opacification. CT CERVICAL SPINE FINDINGS Evaluation of the lower cervical and upper thoracic spine limited by motion. Orogastric and endotracheal tubes visualized in part. No acute soft tissue abnormality visualized. Visualized portion lung apices clear. Reversed lordosis centered at the mid to lower cervical spine appears to be due to degenerative disc disease. There is degenerative disc disease of moderate severity at C3-4, C4-5, and C5-6. No definite fracture identified. IMPRESSION: Acute intracranial hemorrhage with intraventricular extension and possible developing hydrocephalus Although no fractures identified in the cervical spine, the evaluation of the cervical spine is limited. Critical Value/emergent results were called by telephone at the time of interpretation on 09/29/2015 at 5:22 pm to Dr. Ezequiel Essex , who verbally acknowledged these results. Electronically Signed   By: Skipper Cliche M.D.   On: 09/29/2015 17:22   Ct Angio Neck W Or Wo Contrast  09/30/2015  ADDENDUM REPORT: 09/30/2015 13:07 ADDENDUM: CTA head and neck was reported simultaneously by two radiologists. Two reports are present. Both reports reviewed and have similar findings. Electronically Signed   By: Franchot Gallo M.D.   On: 09/30/2015 13:07  09/30/2015  CLINICAL DATA:  Intra cerebral hemorrhage EXAM: CT ANGIOGRAPHY HEAD AND NECK TECHNIQUE:  Multidetector CT imaging of the head and neck was performed using  the standard protocol during bolus administration of intravenous contrast. Multiplanar CT image reconstructions and MIPs were obtained to evaluate the vascular anatomy. Carotid stenosis measurements (when applicable) are obtained utilizing NASCET criteria, using the distal internal carotid diameter as the denominator. CONTRAST:  50 mL Isovue 370 IV COMPARISON:  CT head 09/29/2015 FINDINGS: CTA NECK Aortic arch: Atherosclerotic calcification in the aortic arch. Two vessel aortic arch with the bovine branching pattern. Atherosclerotic disease in the proximal left subclavian artery with less than 50% diameter stenosis. Mild atherosclerotic disease of the innominate artery. The patient is intubated.  NG tube in the esophagus. Right upper lobe nodule 5.3 x 6.7 cm.  Early neoplasm not excluded. Right carotid system: Right common carotid artery widely patent. Calcified plaque involving the carotid bulb narrowing the lumen by 50% diameter stenosis. Right external carotid artery widely patent. Left carotid system: Atherosclerotic plaque throughout the left common carotid artery without significant stenosis. Calcified and noncalcified plaque at the left carotid bifurcation. 25% diameter stenosis proximal left internal carotid artery. Left external carotid artery widely patent. Vertebral arteries:Both vertebral arteries patent to the basilar without significant stenosis. Skeleton: Disc degeneration and spondylosis in the cervical spine. No fracture or skeletal lesion. Other neck: Negative for mass or adenopathy in the neck. CTA HEAD Anterior circulation: Moderate atherosclerotic calcification involving the cavernous carotid bilaterally with mild stenosis bilaterally. No cavernous carotid aneurysm. Anterior and middle cerebral arteries patent bilaterally without significant stenosis. Posterior circulation: Both vertebral arteries patent to the basilar without  stenosis. PICA patent bilaterally. Basilar widely patent. Superior cerebellar and posterior cerebral arteries are patent bilaterally. Atherosclerotic irregularity and mild stenosis in the posterior cerebral arteries bilaterally. Venous sinuses: Patent Anatomic variants: Negative for cerebral aneurysm. No AVM or vascular malformation. Delayed phase: Left medial thalamic hemorrhage is unchanged measuring 24 x 31 mm. Small amount of intraventricular hemorrhage has improved. Interval placement of right frontal ventricular catheter with the tip near the intraventricular septum. Decreased ventricular size since yesterday. No new area of hemorrhage. No enhancing mass lesion postcontrast IMPRESSION: Right thalamic hematoma unchanged in size. No underlying tumor or vascular malformation. Interval placement of right frontal ventricular catheter with decompression of the ventricles since yesterday. 50% diameter stenosis proximal right internal carotid artery due to atherosclerotic disease. 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 On: 09/30/2015 11:45   Ct Cervical Spine Wo Contrast  09/29/2015  CLINICAL DATA:  [place] and hit head last night, somnolence since fall, nonresponsive, pinpoint pupils EXAM: CT HEAD WITHOUT CONTRAST CT CERVICAL SPINE WITHOUT CONTRAST TECHNIQUE: Multidetector CT imaging of the head and cervical spine was performed following the standard protocol without intravenous contrast. Multiplanar CT image reconstructions of the cervical spine were also generated. COMPARISON:  None. FINDINGS: CT HEAD FINDINGS There is an acute hemorrhage centered on the right thalamus measuring about 3 cm. Blood extends into the third ventricle and layers dependently in both occipital horns as well as in the fourth  ventricle. Mild prominence of the ventricles including the temporal horns. No extra-axial fluid collection identified. No skull fracture identified. Scattered bilateral ethmoid air cell opacification. CT CERVICAL SPINE FINDINGS Evaluation of the lower cervical and upper thoracic spine limited by motion. Orogastric and endotracheal tubes visualized in part. No acute soft tissue abnormality visualized. Visualized portion lung apices clear. Reversed lordosis centered at the mid to lower cervical spine appears to  be due to degenerative disc disease. There is degenerative disc disease of moderate severity at C3-4, C4-5, and C5-6. No definite fracture identified. IMPRESSION: Acute intracranial hemorrhage with intraventricular extension and possible developing hydrocephalus Although no fractures identified in the cervical spine, the evaluation of the cervical spine is limited. Critical Value/emergent results were called by telephone at the time of interpretation on 09/29/2015 at 5:22 pm to Dr. Ezequiel Essex , who verbally acknowledged these results. Electronically Signed   By: Skipper Cliche M.D.   On: 09/29/2015 17:22   Dg Chest Port 1 View  09/30/2015  CLINICAL DATA:  Acute respiratory failure with hypoxia. Central line placement. EXAM: PORTABLE CHEST 1 VIEW COMPARISON:  09/29/2015 FINDINGS: Endotracheal tube with tip measuring 4.5 cm above the carina. Enteric tube tip is off the field of view but below the left hemidiaphragm. I do not see a radiopaque central venous catheter within the field of view. Superimposed EKG wires could obscure visualization of the catheter. Normal heart size and pulmonary vascularity. Atelectasis noted in the lung bases. No focal consolidation. No pneumothorax. Calcification of the aorta. IMPRESSION: Appliances appear in satisfactory position. No central venous catheter is identified. Atelectasis in the lung bases. Electronically Signed   By: Lucienne Capers M.D.   On: 09/30/2015 02:33    Dg Chest Portable 1 View  09/29/2015  CLINICAL DATA:  Adjusting placement of endotracheal tube. Patient fell last night striking head. Cardiomyopathy. EXAM: PORTABLE CHEST 1 VIEW COMPARISON:  09/29/2015 FINDINGS: Endotracheal tube is been repositioned with tip now measuring 5 cm above the carina. Enteric tube tip is off the field of view but below the left hemidiaphragm. Linear atelectasis in the left lung base. Heart size and pulmonary vascularity are normal. No blunting of costophrenic angles. No pneumothorax. Calcification of the aorta. Prominent nipple shadow over the right lung base. IMPRESSION: Appliances in satisfactory position. Atelectasis in the left lung base. Electronically Signed   By: Lucienne Capers M.D.   On: 09/29/2015 22:51   Dg Chest Portable 1 View  09/29/2015  CLINICAL DATA:  Pt fell last night and is unresponsive. Post intubation and ng tube placement EXAM: PORTABLE CHEST 1 VIEW COMPARISON:  04/07/2015 FINDINGS: Endotracheal tube extends just into the right mainstem bronchus. Recommend retracting 2 cm. Nasal/orogastric tube passes well below the diaphragm into the distal stomach. Cardiac silhouette normal in size and configuration. Normal mediastinal and hilar contours. Clear lungs. No apparent pleural effusion and no gross pneumothorax on this supine study. Bony thorax is intact. IMPRESSION: 1. Endotracheal tube tip extends just into the right mainstem bronchus. This will need retracted 2 cm for optimal positioning. Critical Value/emergent results were called by telephone at the time of interpretation on 09/29/2015 at 3:33 pm to Dr. Ezequiel Essex , who verbally acknowledged these results. 2. Well-positioned nasal/orogastric tube. 3. No acute cardiopulmonary disease. Electronically Signed   By: Lajean Manes M.D.   On: 09/29/2015 15:33    Assessment/Plan: Intracerebral hemorrhage: We will continue supportive care. I have answered all the patient's husband's questions.  LOS: 2  days     Jakayla Schweppe D 10/01/2015, 6:40 AM

## 2015-10-01 NOTE — Progress Notes (Signed)
PULMONARY / CRITICAL CARE MEDICINE   Name: Joanna Farmer MRN: [id number] DOB: Oct 04, 1955    ADMISSION DATE:  09/29/2015 CONSULTATION DATE:  09/29/2015  REFERRING MD:  Ezequiel Essex, M.D. / [facility]  CHIEF COMPLAINT:  Altered mental status  HISTORY OF PRESENT ILLNESS:   60 year old female past medical history as below, which is significant for hypertension, alcohol abuse, cocaine abuse, HSV, coronary artery disease status post stents to RCA and LAD in 2015, and ischemic cardiomyopathy with LV EF 35-40%. She presented to [facility] emergency department 6/15 with altered mental status. Reportedly the night before she suffered a fall during the night, and since that time has been sleeping all day. She was brought to the emergency department with this complaint. In emergency department she was unresponsive but reportedly breathing adequately. She had no spontaneous movement and left gaze deviation, but did have a positive gag reflex. CT scan of the head was obtained and demonstrated acute intracranial hemorrhage with intraventricular extension. Cervical spine was without radiographic evidence of injury. She was intubated for airway protection with anticipation of transfer to [facility]. PCCM accepting.   SUBJECTIVE:  No events overnight, remains sedate.  VITAL SIGNS: BP 115/47 mmHg  Pulse 64  Temp(Src) 98.5 F (36.9 C) (Axillary)  Resp 29  Ht 5\' 3"  (1.6 m)  Wt 46.4 kg (102 lb 4.7 oz)  BMI 18.13 kg/m2  SpO2 100%  HEMODYNAMICS:    VENTILATOR SETTINGS: Vent Mode:  [-] CPAP;PSV FiO2 (%):  [40 %] 40 % Set Rate:  [14 bmp] 14 bmp Vt Set:  [420 mL] 420 mL PEEP:  [5 cmH20] 5 cmH20 Pressure Support:  [10 cmH20] 10 cmH20 Plateau Pressure:  [13 cmH20-16 cmH20] 13 cmH20  INTAKE / OUTPUT: I/O last 3 completed shifts: In: [id number] [I.V.:3653.7; NG/GT:566.3; IV Piggyback:200] Out: [id number] [Urine:3360; Drains:109]  PHYSICAL EXAMINATION: General:  Sedated. No acute distress daughter  bedside. Integument:  Warm & dry. No rash on exposed skin.  Lymphatics:  No appreciated cervical or supraclavicular lymphadenoapthy. HEENT:  No scleral injection or icterus. Endotracheal tube in place.  Cardiovascular:  Regular rate. No edema. No appreciable JVD.  Pulmonary:  Good aeration & clear to auscultation bilaterally. Symmetric chest wall rise on ventilator. Abdomen: Soft. Normal bowel sounds. Nondistended.  Musculoskeletal:  Normal bulk. No joint deformity or effusion appreciated. Neurological:  Does not open eyes. Intermittently follows commands on right. Spontaneous moves right, minimally withdrawls in L leg, no movement in left arm. Pupils sluggish 66mm/ disconjugate gaze  LABS:  BMET  Recent Labs Lab 09/29/15 1500 09/29/15 1506 09/30/15 0430 10/01/15 0450  NA 135 140 138 141  K 4.0 4.0 3.1* 3.3*  CL 105 107 111 107  CO2 20*  --  19* 27  BUN 16 14 11 11   CREATININE 0.92 0.90 0.85 0.69  GLUCOSE 102* 99 121* 107*   Electrolytes  Recent Labs Lab 09/29/15 1500  09/30/15 0430 09/30/15 1126 09/30/15 1658 10/01/15 0450  CALCIUM 9.2  --  8.1*  --   --  9.2  MG  --   < > 2.0 1.9 1.9 2.0  PHOS  --   < > 3.1 2.1* 2.5 2.8  < > = values in this interval not displayed.  CBC  Recent Labs Lab 09/29/15 1500 09/29/15 1506 09/30/15 0430 10/01/15 0450  WBC 7.4  --  6.5 6.8  HGB 13.5 14.3 10.5* 10.3*  HCT 39.1 42.0 32.3* 32.0*  PLT 211  --  174 143*   Coag's  Recent Labs Lab 09/29/15 1500  INR 0.96    Sepsis Markers  Recent Labs Lab 09/29/15 1500 09/29/15 1511  LATICACIDVEN 1.3 1.41    ABG  Recent Labs Lab 09/29/15 1728 09/30/15 0417  PHART 7.438 7.387  PCO2ART 27.3* 33.8*  PO2ART 128* 186*    Liver Enzymes  Recent Labs Lab 09/29/15 1500  AST 47*  ALT 40  ALKPHOS 65  BILITOT 1.2  ALBUMIN 4.3    Cardiac Enzymes  Recent Labs Lab 09/29/15 1500  TROPONINI 0.04*    Glucose  Recent Labs Lab 09/30/15 1152 09/30/15 1522  09/30/15 1955 09/30/15 2339 10/01/15 0329 10/01/15 0743  GLUCAP 137* 158* 126* 130* 123* 102*    Imaging Ct Angio Head W Or Wo Contrast  09/30/2015  ADDENDUM REPORT: 09/30/2015 13:09 ADDENDUM: This combined head and neck CTA examination was inadvertently reported in full by two different radiologists (Dr. Jeralyn Ruths on the CTA head accession and Dr. Carlis Abbott on the CTA neck accession). The two reports do not substantially differ in content. Electronically Signed   By: Logan Bores M.D.   On: 09/30/2015 13:09  09/30/2015  CLINICAL DATA:  Intracranial hemorrhage.  Fall. EXAM: CT ANGIOGRAPHY HEAD AND NECK TECHNIQUE: Multidetector CT imaging of the head and neck was performed using the standard protocol during bolus administration of intravenous contrast. Multiplanar CT image reconstructions and MIPs were obtained to evaluate the vascular anatomy. Carotid stenosis measurements (when applicable) are obtained utilizing NASCET criteria, using the distal internal carotid diameter as the denominator. CONTRAST:  50 mL Isovue 370 COMPARISON:  Noncontrast head CT 09/29/2015 FINDINGS: CTA NECK Aortic arch: Normal variant aortic arch branching with common origin of the brachiocephalic and left subclavian arteries. Mild-to-moderate atheromatous plaque in the aortic arch and left subclavian artery without significant stenosis. Right carotid system: Moderate plaque at the carotid bifurcation results in approximately 50% proximal ICA stenosis. Left carotid system: Mild-to-moderate plaque throughout the common carotid artery without significant stenosis. Moderate plaque involving the carotid bifurcation and proximal ICA without significant stenosis. Vertebral arteries: The vertebral arteries are patent and codominant. There is mild proximal right vertebral artery stenosis, and there is minimal to mild right V2 narrowing at the C4 and C5 levels. No significant left vertebral artery stenosis. Skeleton: Multilevel cervical disc  degeneration with degenerative endplate irregularity and prominent sclerosis throughout much of the C4 and C5 vertebral bodies Other neck: Partially visualized enteric tube. Endotracheal tube terminates well above the carina. Associated retained secretions in the pharynx. Scattered venous gas likely iatrogenic. Prominent lucency posteriorly in the right maxillary alveolar ridge associated with the roots of the first and third molars. Right maxillary second molar is absent. CTA HEAD Anterior circulation: The internal carotid arteries are patent from skullbase to carotid termini. There is moderate right and mild left carotid siphon atherosclerosis with mild cavernous stenosis on the right. The right A1 segment is severely hypoplastic or absent. Left A1 segment is widely patent and supplies the right A2. MCAs are patent without evidence of major branch occlusion or significant proximal stenosis. Mild ACA and MCA branch vessel irregularity is noted bilaterally, and there is mild proximal left M2 superior division stenosis. No intracranial aneurysm is identified. Posterior circulation: Intracranial vertebral arteries are widely patent to the basilar. AICA and SCA origins are patent. Basilar artery is widely patent. Posterior communicating arteries are not identified. PCAs are patent with mild irregularity bilaterally including mild bilateral P2 segment narrowing but no flow limiting proximal stenosis. Venous sinuses: Patent. Anatomic variants: Absent right  A1. Delayed phase: Interval right frontal approach external ventricular drain placement which courses through the right lateral ventricle and terminates in the midline near the foramen of [place]. Lateral ventricles have decreased in size compared to the prior CT. Approximately 3 cm hemorrhage centered in the right thalamus is unchanged. Small volume hemorrhage in the left lateral ventricle is similar to the prior CT. Hemorrhage in the fourth ventricle has decreased. No  abnormal enhancement is identified. IMPRESSION: 1. No evidence of major intracranial arterial occlusion, aneurysm, or flow limiting proximal stenosis. 2. Intracranial atherosclerosis with mild right ICA, mild left M2, and mild bilateral P2 narrowing. 3. Unchanged 3 cm right thalamic hemorrhage. Small volume intraventricular hemorrhage, slightly decreased from prior. 4. 50% proximal right ICA stenosis. 5. Mild proximal right vertebral artery stenosis. Electronically Signed: By: Logan Bores M.D. On: 09/30/2015 11:59   Ct Angio Neck W Or Wo Contrast  09/30/2015  ADDENDUM REPORT: 09/30/2015 13:07 ADDENDUM: CTA head and neck was reported simultaneously by two radiologists. Two reports are present. Both reports reviewed and have similar findings. Electronically Signed   By: Franchot Gallo M.D.   On: 09/30/2015 13:07  09/30/2015  CLINICAL DATA:  Intra cerebral hemorrhage EXAM: CT ANGIOGRAPHY HEAD AND NECK TECHNIQUE: Multidetector CT imaging of the head and neck was performed using the standard protocol during bolus administration of intravenous contrast. Multiplanar CT image reconstructions and MIPs were obtained to evaluate the vascular anatomy. Carotid stenosis measurements (when applicable) are obtained utilizing NASCET criteria, using the distal internal carotid diameter as the denominator. CONTRAST:  50 mL Isovue 370 IV COMPARISON:  CT head 09/29/2015 FINDINGS: CTA NECK Aortic arch: Atherosclerotic calcification in the aortic arch. Two vessel aortic arch with the bovine branching pattern. Atherosclerotic disease in the proximal left subclavian artery with less than 50% diameter stenosis. Mild atherosclerotic disease of the innominate artery. The patient is intubated.  NG tube in the esophagus. Right upper lobe nodule 5.3 x 6.7 cm.  Early neoplasm not excluded. Right carotid system: Right common carotid artery widely patent. Calcified plaque involving the carotid bulb narrowing the lumen by 50% diameter stenosis.  Right external carotid artery widely patent. Left carotid system: Atherosclerotic plaque throughout the left common carotid artery without significant stenosis. Calcified and noncalcified plaque at the left carotid bifurcation. 25% diameter stenosis proximal left internal carotid artery. Left external carotid artery widely patent. Vertebral arteries:Both vertebral arteries patent to the basilar without significant stenosis. Skeleton: Disc degeneration and spondylosis in the cervical spine. No fracture or skeletal lesion. Other neck: Negative for mass or adenopathy in the neck. CTA HEAD Anterior circulation: Moderate atherosclerotic calcification involving the cavernous carotid bilaterally with mild stenosis bilaterally. No cavernous carotid aneurysm. Anterior and middle cerebral arteries patent bilaterally without significant stenosis. Posterior circulation: Both vertebral arteries patent to the basilar without stenosis. PICA patent bilaterally. Basilar widely patent. Superior cerebellar and posterior cerebral arteries are patent bilaterally. Atherosclerotic irregularity and mild stenosis in the posterior cerebral arteries bilaterally. Venous sinuses: Patent Anatomic variants: Negative for cerebral aneurysm. No AVM or vascular malformation. Delayed phase: Left medial thalamic hemorrhage is unchanged measuring 24 x 31 mm. Small amount of intraventricular hemorrhage has improved. Interval placement of right frontal ventricular catheter with the tip near the intraventricular septum. Decreased ventricular size since yesterday. No new area of hemorrhage. No enhancing mass lesion postcontrast IMPRESSION: Right thalamic hematoma unchanged in size. No underlying tumor or vascular malformation. Interval placement of right frontal ventricular catheter with decompression of the ventricles since yesterday. 50%  diameter stenosis proximal right internal carotid artery due to atherosclerotic disease. 25% diameter stenosis proximal  left internal carotid artery due to atherosclerotic disease Atherosclerotic disease in the cavernous carotid bilaterally and in the posterior cerebral arteries bilaterally Electronically Signed: By: Franchot Gallo M.D. On: 09/30/2015 11:45   Dg Chest Port 1 View  10/01/2015  CLINICAL DATA:  Acute respiratory failure EXAM: PORTABLE CHEST 1 VIEW COMPARISON:  Chest radiograph from one day prior. FINDINGS: Endotracheal tube tip is 3.0 cm above the carina. Enteric tube terminates in the distal stomach. Stable cardiomediastinal silhouette with normal heart size. No pneumothorax. No pleural effusion. No pulmonary edema. Mild patchy left lung base opacity, slightly increased. IMPRESSION: 1. Support structures as described.  No pneumothorax . 2. Mild left lung base opacity, slightly increased, favor mild atelectasis. Electronically Signed   By: Ilona Sorrel M.D.   On: 10/01/2015 08:29     STUDIES:  CT Head W/O 6/15: Acute intracranial hemorrhage with intraventricular extension and possible developing hydrocephalus Although no fractures identified in the cervical spine, the evaluation of the cervical spine is limited. CT Neck 6/15: Limited evaluation of cervical spine due to motion. No fractures evident. Port CXR 6/15: Silhouetting left hemidiaphragm. Endotracheal tube tip at the level of the carina to nearly entering right mainstem bronchus. Orogastric tube passing below diaphragm. Port CXR 6/16>>ETT 4.5 cm above carina. Atelectasis in lung bases  MICROBIOLOGY: MRSA PCR 6/15 >> (-) Blood Ctx x2 6/15 >>  ANTIBIOTICS: 6/15 Rocephin >> 6/16 Acef (prophylactic abx) >>  SIGNIFICANT EVENTS: 6/15 - Admit to ICU on ventilator for ICH after transfer from [facility] ED  LINES/TUBES: OETT 7.5 6/15 >> Foley 6/15 >> OGT 6/15 >> PIV X3 IVC Drain 6/16 >>   DISCUSSION: 60 year old female past medical history of coronary artery disease, ischemic cardiomyopathy, and polysubstance abuse being admitted with right  thalamic intracranial hemorrhage. She is intubated for airway protection and was transferred to [facility] for ICU admission. Will maintain on ventilator with tight blood pressure control with Cardene infusion. Neurology and neurosurgery consulted.  IVC drain placed early am 6/16.    ASSESSMENT / PLAN:  PULMONARY A: Acute Hypoxemic resp failure 2/2 Unable to Protect Airway - Secondary to [place].  P:   Begin PS trials but no extubation given mental status. Wean O2 for sats > 94%. PCXR in am.  CARDIOVASCULAR A:  Chronic Systolic CHF - EF 123456 w/ grade 1 diastolic dysfunction. RV normal. H/O Hypertension H/O CAD - S/P PCI in 2015 H/O Hyperlipidemia  P:  Telemetry monitoring in ICU Tight BP control with SBP goal < 12mmHg No beta blocker as positive for cocaine Holding IV anti-HTN for now. Lipitor VT daily WOF pulm edema since EF is 20%, no echo pending.  RENAL A:   Metabolic Acidosis - Mild.  P:   Monitor renal function and UOP. Strict I&O. Correct electrolytes as indicated. KVO IVF.  GASTROINTESTINAL A:   H/O GERD  P:   TF per nutrition. Pepcid IV QD  HEMATOLOGIC A:   No acute issues.  P:  Trending cell counts daily w/ CBC SCDs  No chemical DVT ppx given ICH  INFECTIOUS A:   No acute evidence of infection. H/O HSV Infection  P:   Follow WBC and fever curve Prophylactic Abx for Ventriculostomy  ENDOCRINE A:   No acute issues  P:   Follow CBG on daily BMP  NEUROLOGIC A:   Right Thalamic Intracranial Hemorrhage s/p IVC 6/16 Cocaine Abuse - urine drug screen  positive for cocaine on admission H/O EtOH Abuse H/O Depression  P:   RASS goal: 0 Neurology Consult Neurosurgery Consult Propofol gtt Fentanyl IV prn Blood pressure control as above   FAMILY  - Updates: Family updated at bedside 6/17.  - Inter-disciplinary family meet or Palliative Care meeting due by:  6/22  The patient is critically ill with multiple organ systems failure and  requires high complexity decision making for assessment and support, frequent evaluation and titration of therapies, application of advanced monitoring technologies and extensive interpretation of multiple databases.   Critical Care Time devoted to patient care services described in this note is  35  Minutes. This time reflects time of care of this signee Dr Jennet Maduro. This critical care time does not reflect procedure time, or teaching time or supervisory time of PA/NP/Med student/Med Resident etc but could involve care discussion time.  [name], M.D. [facility] Pulmonary/Critical Care Medicine. Pager: [phone]. After hours pager: [phone].

## 2015-10-01 NOTE — Progress Notes (Signed)
  Echocardiogram 2D Echocardiogram has been performed.  Joanna Farmer 10/01/2015, 10:23 AM

## 2015-10-02 ENCOUNTER — Inpatient Hospital Stay (HOSPITAL_COMMUNITY): Payer: Medicaid Other

## 2015-10-02 LAB — BLOOD GAS, ARTERIAL
Acid-Base Excess: 2.8 mmol/L — ABNORMAL HIGH (ref 0.0–2.0)
Bicarbonate: 26.6 mEq/L — ABNORMAL HIGH (ref 20.0–24.0)
DRAWN BY: 441381
FIO2: 0.4
MECHVT: 420 mL
O2 SAT: 98.4 %
PATIENT TEMPERATURE: 98.6
PCO2 ART: 39.2 mmHg (ref 35.0–45.0)
PEEP: 5 cmH2O
PH ART: 7.446 (ref 7.350–7.450)
RATE: 14 resp/min
TCO2: 27.8 mmol/L (ref 0–100)
pO2, Arterial: 126 mmHg — ABNORMAL HIGH (ref 80.0–100.0)

## 2015-10-02 LAB — CBC
HCT: 33.2 % — ABNORMAL LOW (ref 36.0–46.0)
Hemoglobin: 10.7 g/dL — ABNORMAL LOW (ref 12.0–15.0)
MCH: 31.5 pg (ref 26.0–34.0)
MCHC: 32.2 g/dL (ref 30.0–36.0)
MCV: 97.6 fL (ref 78.0–100.0)
PLATELETS: 165 10*3/uL (ref 150–400)
RBC: 3.4 MIL/uL — ABNORMAL LOW (ref 3.87–5.11)
RDW: 13.6 % (ref 11.5–15.5)
WBC: 7.7 10*3/uL (ref 4.0–10.5)

## 2015-10-02 LAB — GLUCOSE, CAPILLARY
GLUCOSE-CAPILLARY: 135 mg/dL — AB (ref 65–99)
GLUCOSE-CAPILLARY: 137 mg/dL — AB (ref 65–99)
Glucose-Capillary: 132 mg/dL — ABNORMAL HIGH (ref 65–99)
Glucose-Capillary: 121 mg/dL — ABNORMAL HIGH (ref 65–99)
Glucose-Capillary: 131 mg/dL — ABNORMAL HIGH (ref 65–99)
Glucose-Capillary: 118 mg/dL — ABNORMAL HIGH (ref 65–99)

## 2015-10-02 LAB — BASIC METABOLIC PANEL
ANION GAP: 10 (ref 5–15)
BUN: 13 mg/dL (ref 6–20)
CALCIUM: 9.6 mg/dL (ref 8.9–10.3)
CO2: 27 mmol/L (ref 22–32)
Chloride: 104 mmol/L (ref 101–111)
Creatinine, Ser: 0.74 mg/dL (ref 0.44–1.00)
GFR calc Af Amer: >60 mL/min (ref >60)
GLUCOSE: 116 mg/dL — AB (ref 65–99)
Potassium: 3.7 mmol/L (ref 3.5–5.1)
SODIUM: 141 mmol/L (ref 135–145)

## 2015-10-02 LAB — URINE CULTURE

## 2015-10-02 LAB — MAGNESIUM: Magnesium: 1.9 mg/dL (ref 1.7–2.4)

## 2015-10-02 LAB — TRIGLYCERIDES: TRIGLYCERIDES: 77 mg/dL (ref <150)

## 2015-10-02 LAB — PHOSPHORUS: Phosphorus: 4.3 mg/dL (ref 2.5–4.6)

## 2015-10-02 MED ORDER — MAGNESIUM SULFATE 2 GM/50ML IV SOLN
2.0000 g | Freq: Once | INTRAVENOUS | Status: AC
  Administered 2015-10-02: 2 g via INTRAVENOUS
  Filled 2015-10-02: qty 50

## 2015-10-02 MED ORDER — HEPARIN SODIUM (PORCINE) 5000 UNIT/ML IJ SOLN
5000.0000 [IU] | Freq: Two times a day (BID) | INTRAMUSCULAR | Status: DC
  Administered 2015-10-02 – 2015-10-03 (×3): 5000 [IU] via SUBCUTANEOUS
  Filled 2015-10-02 (×3): qty 1

## 2015-10-02 NOTE — Progress Notes (Signed)
STROKE TEAM PROGRESS NOTE   SUBJECTIVE (INTERVAL HISTORY) Her boyfriend is at the bedside.  Overall she feels her condition is improved. She is still intubated on vent, but only on pressure support. As per nurse, she is able to follow commands with right hand and right toe once off sedation. EVD draining minimal, currently at [place]. On tube feeding. UDS showed cocaine positive. Still on cleviprex. Will resume beta blocker today.    OBJECTIVE Temp:  [98.9 F (37.2 C)-100.3 F (37.9 C)] 99.2 F (37.3 C) (06/18 0800) Pulse Rate:  [60-111] 73 (06/18 0815) Cardiac Rhythm:  [-] Normal sinus rhythm (06/18 0828) Resp:  [11-29] 16 (06/18 0815) BP: (104-160)/(47-96) 135/63 mmHg (06/18 0815) SpO2:  [99 %-100 %] 100 % (06/18 0815) FiO2 (%):  [40 %] 40 % (06/18 0805) Weight:  [46.7 kg (102 lb 15.3 oz)] 46.7 kg (102 lb 15.3 oz) (06/18 0600)  CBC:  Recent Labs Lab 09/29/15 1500  10/01/15 0450 10/02/15 0415  WBC 7.4  < > 6.8 7.7  NEUTROABS 6.1  --   --   --   HGB 13.5  < > 10.3* 10.7*  HCT 39.1  < > 32.0* 33.2*  MCV 96.5  < > 97.0 97.6  PLT 211  < > 143* 165  < > = values in this interval not displayed.  Basic Metabolic Panel:   Recent Labs Lab 10/01/15 0450 10/01/15 1652 10/02/15 0415  NA 141  --  141  K 3.3*  --  3.7  CL 107  --  104  CO2 27  --  27  GLUCOSE 107*  --  116*  BUN 11  --  13  CREATININE 0.69  --  0.74  CALCIUM 9.2  --  9.6  MG 2.0 1.9 1.9  PHOS 2.8 1.9* 4.3    Lipid Panel:     Component Value Date/Time   CHOL 134 10/01/2015 0450   TRIG 77 10/02/2015 0415   HDL 77 10/01/2015 0450   CHOLHDL 1.7 10/01/2015 0450   VLDL 24 10/01/2015 0450   LDLCALC 33 10/01/2015 0450   HgbA1c: No results found for: HGBA1C Urine Drug Screen:     Component Value Date/Time   LABOPIA NONE DETECTED 09/30/2015 1816   COCAINSCRNUR POSITIVE* 09/30/2015 1816   LABBENZ POSITIVE* 09/30/2015 1816   AMPHETMU NONE DETECTED 09/30/2015 1816   THCU NONE DETECTED 09/30/2015 1816    LABBARB NONE DETECTED 09/30/2015 1816      IMAGING I have personally reviewed the radiological images below and agree with the radiology interpretations.  Ct Angio Head and neck W Or Wo Contrast 09/30/2015   1. No evidence of major intracranial arterial occlusion, aneurysm, or flow limiting proximal stenosis.  2. Intracranial atherosclerosis with mild right ICA, mild left M2, and mild bilateral P2 narrowing.  3. Unchanged 3 cm right thalamic hemorrhage. Small volume intraventricular hemorrhage, slightly decreased from prior.  4. 50% proximal right ICA stenosis.  5. Mild proximal right vertebral artery stenosis.   Ct Head Wo Contrast 09/29/2015   Acute intracranial hemorrhage with intraventricular extension and possible developing hydrocephalus Although no fractures identified in the cervical spine, the evaluation of the cervical spine is limited.   Ct Cervical Spine Wo Contrast 09/29/2015   Acute intracranial hemorrhage with intraventricular extension and possible developing hydrocephalus Although no fractures identified in the cervical spine, the evaluation of the cervical spine is limited.   Dg Chest Port 1 View 09/30/2015   Appliances appear in satisfactory position. No central venous  catheter is identified. Atelectasis in the lung bases.    Dg Chest Port 1 View 10/02/2015 Pending   TTE - - Left ventricle: The cavity size was mildly dilated. Wall  thickness was increased in a pattern of moderate LVH. Systolic  function was normal. The estimated ejection fraction was in the  range of 60% to 65%. Wall motion was normal; there were no  regional wall motion abnormalities. Doppler parameters are  consistent with both elevated ventricular end-diastolic filling  pressure and elevated left atrial filling pressure. - Atrial septum: No defect or patent foramen ovale was identified.   PHYSICAL EXAM Temp:  [98.9 F (37.2 C)-100.3 F (37.9 C)] 99.2 F (37.3 C) (06/18  0800) Pulse Rate:  [60-111] 73 (06/18 0815) Resp:  [11-29] 16 (06/18 0815) BP: (104-160)/(47-96) 135/63 mmHg (06/18 0815) SpO2:  [99 %-100 %] 100 % (06/18 0815) FiO2 (%):  [40 %] 40 % (06/18 0805) Weight:  [46.7 kg (102 lb 15.3 oz)] 46.7 kg (102 lb 15.3 oz) (06/18 0600)  General - Well nourished, well developed, intubated.  Ophthalmologic - Fundi not visualized due to ET positioning.  Cardiovascular - Regular rate and rhythm.  Neuro - intubated on vent and propofol, eyes not open, not following commands. PERRL, eyes in the middle position, left eye downward disconjugation, doll's eye present, positive gag and corneal. Facial droop not able to evaluate due to ET tube. Right UE and LE spontaneous movement. On pain stimulation, strong against gravity on the right UE and LE, left UE mild withdraw but not against gravity and LLE 3/5. Babinski positive bilaterally, DTR 1+. Sensation, gait and coordination not tested.   ASSESSMENT/PLAN [name] is a 60 y.o. female with history of hypertension, hyperlipidemia, alcohol abuse, cocaine abuse, herpes simplex, coronary artery disease with previous MI and stents, and cardiomyopathy  presenting with altered mental status and a fall. She did not receive IV t-PA due to intracranial hemorrhage.  ICH:  Non-dominant right BG/thalamic/hypothalamic ICH with IVH s/p EVD placement likely secondary to HTN.  Resultant  Left hemiparesis, respiratory failure  MRI MRA not performed  CTA head and neck - stable ICH, right proximal ICA 50% stenosis  2D Echo EF 60-65%  LDL - 33  HgbA1c pending  UDS cocaine positive  VTE prophylaxis - SCDs Diet NPO time specified  aspirin 81 mg daily and clopidogrel 75 mg daily prior to admission, now on No antithrombotic secondary to hemorrhage.  Ongoing aggressive stroke risk factor management  Therapy recommendations: Pending  Disposition: Pending  Respiratory failure  Intubated on vent  CCM on  board  Wean off vent as able  Hypertensive emergency  On cleviprex  BP goal < 160  Resume home BP meds - on cozaar and HCTZ now, resume coreg today due to cocaine abuse  Wean off cleviprex as able  Hyperlipidemia  Home meds: Lipitor 80 mg daily - not resumed in the hospital secondary to [place].  LDL 33, goal < 70  Resume statin at discharge  Cocaine abuse  UDS positive for cocaine  Hx of cocaine use  Other Stroke Risk Factors  Advanced age  Cigarette smoker - advised to stop smoking  ETOH use, advised to drink no more than 1-2 drinks per day.  Coronary artery disease s/p stent - on home DAPT with high dose lipitor  Cardiomyopathy - EF 20% in 01/2015 but this admission EF 60-65%  Other Active Problems  Anemia - hemoglobin 10.3; hematocrit 32  Hypokalemia - supplemented -> 3.7  Hospital day # 3  This patient is critically ill due to [place] with IVH s/p EVD and hypertension and intubation and at significant risk of neurological worsening, death form hematoma expansion, hydrocephalus, heart failure, cerebral edema and herniation. This patient's care requires constant monitoring of vital signs, hemodynamics, respiratory and cardiac monitoring, review of multiple databases, neurological assessment, discussion with family, other specialists and medical decision making of high complexity. I spent 35 minutes of neurocritical care time in the care of this patient.  Rosalin Hawking, MD PhD Stroke Neurology 10/02/2015 10:12 AM     To contact Stroke Continuity provider, please refer to [email or web address] After hours, contact General Neurology

## 2015-10-02 NOTE — Progress Notes (Signed)
Patient ID: Joanna Farmer, female   DOB: 1955-07-10, 60 y.o.   MRN: [id number] Subjective:  the patient is without change. Her husband is at the bedside.  Objective: Vital signs in last 24 hours: Temp:  [98.9 F (37.2 C)-100.3 F (37.9 C)] 99.2 F (37.3 C) (06/18 0800) Pulse Rate:  [60-111] 85 (06/18 0900) Resp:  [11-28] 14 (06/18 0900) BP: (104-160)/(50-96) 138/61 mmHg (06/18 0900) SpO2:  [99 %-100 %] 100 % (06/18 0900) FiO2 (%):  [40 %] 40 % (06/18 0805) Weight:  [46.7 kg (102 lb 15.3 oz)] 46.7 kg (102 lb 15.3 oz) (06/18 0600)  Intake/Output from previous day: 06/17 0701 - 06/18 0700 In: 1319.3 [I.V.:959.3; NG/GT:360] Out: 2064 [Urine:1980; Drains:84] Intake/Output this shift: Total I/O In: 500.9 [I.V.:50.9; NG/GT:450] Out: 60 [Urine:55; Drains:5]  Physical exam Glasgow Coma Scale 8, intubated [id number].The patient is left hemiplegic. She localizes on the right. Her ventriculostomy is patent.  Lab Results:  Recent Labs  10/01/15 0450 10/02/15 0415  WBC 6.8 7.7  HGB 10.3* 10.7*  HCT 32.0* 33.2*  PLT 143* 165   BMET  Recent Labs  10/01/15 0450 10/02/15 0415  NA 141 141  K 3.3* 3.7  CL 107 104  CO2 27 27  GLUCOSE 107* 116*  BUN 11 13  CREATININE 0.69 0.74  CALCIUM 9.2 9.6    Studies/Results: Ct Angio Head W Or Wo Contrast  09/30/2015  ADDENDUM REPORT: 09/30/2015 13:09 ADDENDUM: This combined head and neck CTA examination was inadvertently reported in full by two different radiologists (Dr. Jeralyn Ruths on the CTA head accession and Dr. Carlis Abbott on the CTA neck accession). The two reports do not substantially differ in content. Electronically Signed   By: Logan Bores M.D.   On: 09/30/2015 13:09  09/30/2015  CLINICAL DATA:  Intracranial hemorrhage.  Fall. EXAM: CT ANGIOGRAPHY HEAD AND NECK TECHNIQUE: Multidetector CT imaging of the head and neck was performed using the standard protocol during bolus administration of intravenous contrast. Multiplanar CT image reconstructions  and MIPs were obtained to evaluate the vascular anatomy. Carotid stenosis measurements (when applicable) are obtained utilizing NASCET criteria, using the distal internal carotid diameter as the denominator. CONTRAST:  50 mL Isovue 370 COMPARISON:  Noncontrast head CT 09/29/2015 FINDINGS: CTA NECK Aortic arch: Normal variant aortic arch branching with common origin of the brachiocephalic and left subclavian arteries. Mild-to-moderate atheromatous plaque in the aortic arch and left subclavian artery without significant stenosis. Right carotid system: Moderate plaque at the carotid bifurcation results in approximately 50% proximal ICA stenosis. Left carotid system: Mild-to-moderate plaque throughout the common carotid artery without significant stenosis. Moderate plaque involving the carotid bifurcation and proximal ICA without significant stenosis. Vertebral arteries: The vertebral arteries are patent and codominant. There is mild proximal right vertebral artery stenosis, and there is minimal to mild right V2 narrowing at the C4 and C5 levels. No significant left vertebral artery stenosis. Skeleton: Multilevel cervical disc degeneration with degenerative endplate irregularity and prominent sclerosis throughout much of the C4 and C5 vertebral bodies Other neck: Partially visualized enteric tube. Endotracheal tube terminates well above the carina. Associated retained secretions in the pharynx. Scattered venous gas likely iatrogenic. Prominent lucency posteriorly in the right maxillary alveolar ridge associated with the roots of the first and third molars. Right maxillary second molar is absent. CTA HEAD Anterior circulation: The internal carotid arteries are patent from skullbase to carotid termini. There is moderate right and mild left carotid siphon atherosclerosis with mild cavernous stenosis on the right. The right  A1 segment is severely hypoplastic or absent. Left A1 segment is widely patent and supplies the right  A2. MCAs are patent without evidence of major branch occlusion or significant proximal stenosis. Mild ACA and MCA branch vessel irregularity is noted bilaterally, and there is mild proximal left M2 superior division stenosis. No intracranial aneurysm is identified. Posterior circulation: Intracranial vertebral arteries are widely patent to the basilar. AICA and SCA origins are patent. Basilar artery is widely patent. Posterior communicating arteries are not identified. PCAs are patent with mild irregularity bilaterally including mild bilateral P2 segment narrowing but no flow limiting proximal stenosis. Venous sinuses: Patent. Anatomic variants: Absent right A1. Delayed phase: Interval right frontal approach external ventricular drain placement which courses through the right lateral ventricle and terminates in the midline near the foramen of [place]ro. Lateral ventricles have decreased in size compared to the prior CT. Approximately 3 cm hemorrhage centered in the right thalamus is unchanged. Small volume hemorrhage in the left lateral ventricle is similar to the prior CT. Hemorrhage in the fourth ventricle has decreased. No abnormal enhancement is identified. IMPRESSION: 1. No evidence of major intracranial arterial occlusion, aneurysm, or flow limiting proximal stenosis. 2. Intracranial atherosclerosis with mild right ICA, mild left M2, and mild bilateral P2 narrowing. 3. Unchanged 3 cm right thalamic hemorrhage. Small volume intraventricular hemorrhage, slightly decreased from prior. 4. 50% proximal right ICA stenosis. 5. Mild proximal right vertebral artery stenosis. Electronically Signed: By: Logan Bores M.D. On: 09/30/2015 11:59   Ct Angio Neck W Or Wo Contrast  09/30/2015  ADDENDUM REPORT: 09/30/2015 13:07 ADDENDUM: CTA head and neck was reported simultaneously by two radiologists. Two reports are present. Both reports reviewed and have similar findings. Electronically Signed   By: Franchot Gallo M.D.   On:  09/30/2015 13:07  09/30/2015  CLINICAL DATA:  Intra cerebral hemorrhage EXAM: CT ANGIOGRAPHY HEAD AND NECK TECHNIQUE: Multidetector CT imaging of the head and neck was performed using the standard protocol during bolus administration of intravenous contrast. Multiplanar CT image reconstructions and MIPs were obtained to evaluate the vascular anatomy. Carotid stenosis measurements (when applicable) are obtained utilizing NASCET criteria, using the distal internal carotid diameter as the denominator. CONTRAST:  50 mL Isovue 370 IV COMPARISON:  CT head 09/29/2015 FINDINGS: CTA NECK Aortic arch: Atherosclerotic calcification in the aortic arch. Two vessel aortic arch with the bovine branching pattern. Atherosclerotic disease in the proximal left subclavian artery with less than 50% diameter stenosis. Mild atherosclerotic disease of the innominate artery. The patient is intubated.  NG tube in the esophagus. Right upper lobe nodule 5.3 x 6.7 cm.  Early neoplasm not excluded. Right carotid system: Right common carotid artery widely patent. Calcified plaque involving the carotid bulb narrowing the lumen by 50% diameter stenosis. Right external carotid artery widely patent. Left carotid system: Atherosclerotic plaque throughout the left common carotid artery without significant stenosis. Calcified and noncalcified plaque at the left carotid bifurcation. 25% diameter stenosis proximal left internal carotid artery. Left external carotid artery widely patent. Vertebral arteries:Both vertebral arteries patent to the basilar without significant stenosis. Skeleton: Disc degeneration and spondylosis in the cervical spine. No fracture or skeletal lesion. Other neck: Negative for mass or adenopathy in the neck. CTA HEAD Anterior circulation: Moderate atherosclerotic calcification involving the cavernous carotid bilaterally with mild stenosis bilaterally. No cavernous carotid aneurysm. Anterior and middle cerebral arteries patent  bilaterally without significant stenosis. Posterior circulation: Both vertebral arteries patent to the basilar without stenosis. PICA patent bilaterally. Basilar widely patent.  Superior cerebellar and posterior cerebral arteries are patent bilaterally. Atherosclerotic irregularity and mild stenosis in the posterior cerebral arteries bilaterally. Venous sinuses: Patent Anatomic variants: Negative for cerebral aneurysm. No AVM or vascular malformation. Delayed phase: Left medial thalamic hemorrhage is unchanged measuring 24 x 31 mm. Small amount of intraventricular hemorrhage has improved. Interval placement of right frontal ventricular catheter with the tip near the intraventricular septum. Decreased ventricular size since yesterday. No new area of hemorrhage. No enhancing mass lesion postcontrast IMPRESSION: Right thalamic hematoma unchanged in size. No underlying tumor or vascular malformation. Interval placement of right frontal ventricular catheter with decompression of the ventricles since yesterday. 50% diameter stenosis proximal right internal carotid artery due to atherosclerotic disease. 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 On: 09/30/2015 11:45   Dg Chest Port 1 View  10/02/2015  CLINICAL DATA:  Endotracheal tube. EXAM: PORTABLE CHEST 1 VIEW COMPARISON:  Radiograph of October 01, 2015. FINDINGS: The heart size and mediastinal contours are within normal limits. Endotracheal and nasogastric tubes are in grossly good position and stable compared to prior exam. No pneumothorax or pleural effusion is noted. Both lungs are clear. The visualized skeletal structures are unremarkable. IMPRESSION: Stable support apparatus. No acute cardiopulmonary abnormality seen. Electronically Signed   By: Marijo Conception,  M.D.   On: 10/02/2015 09:06   Dg Chest Port 1 View  10/01/2015  CLINICAL DATA:  Acute respiratory failure EXAM: PORTABLE CHEST 1 VIEW COMPARISON:  Chest radiograph from one day prior. FINDINGS: Endotracheal tube tip is 3.0 cm above the carina. Enteric tube terminates in the distal stomach. Stable cardiomediastinal silhouette with normal heart size. No pneumothorax. No pleural effusion. No pulmonary edema. Mild patchy left lung base opacity, slightly increased. IMPRESSION: 1. Support structures as described.  No pneumothorax . 2. Mild left lung base opacity, slightly increased, favor mild atelectasis. Electronically Signed   By: Ilona Sorrel M.D.   On: 10/01/2015 08:29    Assessment/Plan: intracerebral hemorrhage: the patient is neurologically stable. We will continue with supportive care.  LOS: 3 days     Bernadine Melecio D 10/02/2015, 10:30 AM

## 2015-10-02 NOTE — Progress Notes (Signed)
PULMONARY / CRITICAL CARE MEDICINE   Name: Joanna Farmer MRN: [id number] DOB: 1955-07-19    ADMISSION DATE:  09/29/2015 CONSULTATION DATE:  09/29/2015  REFERRING MD:  Ezequiel Essex, M.D. / [facility]  CHIEF COMPLAINT:  Altered mental status  HISTORY OF PRESENT ILLNESS:   60 year old female past medical history as below, which is significant for hypertension, alcohol abuse, cocaine abuse, HSV, coronary artery disease status post stents to RCA and LAD in 2015, and ischemic cardiomyopathy with LV EF 35-40%. She presented to [facility] emergency department 6/15 with altered mental status. Reportedly the night before she suffered a fall during the night, and since that time has been sleeping all day. She was brought to the emergency department with this complaint. In emergency department she was unresponsive but reportedly breathing adequately. She had no spontaneous movement and left gaze deviation, but did have a positive gag reflex. CT scan of the head was obtained and demonstrated acute intracranial hemorrhage with intraventricular extension. Cervical spine was without radiographic evidence of injury. She was intubated for airway protection with anticipation of transfer to [facility]. PCCM accepting.   SUBJECTIVE:  No events overnight, moving more this AM.  VITAL SIGNS: BP 138/61 mmHg  Pulse 85  Temp(Src) 99.2 F (37.3 C) (Axillary)  Resp 14  Ht 5\' 3"  (1.6 m)  Wt 46.7 kg (102 lb 15.3 oz)  BMI 18.24 kg/m2  SpO2 100%  HEMODYNAMICS:    VENTILATOR SETTINGS: Vent Mode:  [-] CPAP;PSV FiO2 (%):  [40 %] 40 % Set Rate:  [14 bmp] 14 bmp Vt Set:  [420 mL] 420 mL PEEP:  [5 cmH20] 5 cmH20 Pressure Support:  [5 cmH20] 5 cmH20 Plateau Pressure:  [11 cmH20-16 cmH20] 16 cmH20  INTAKE / OUTPUT: I/O last 3 completed shifts: In: 3191.4 [I.V.:2401.4; NG/GT:690; IV Piggyback:100] Out: [id number] [Urine:3105; Drains:120]  PHYSICAL EXAMINATION: General:  Sedated. No acute distress daughter  bedside. Integument:  Warm & dry. No rash on exposed skin.  Lymphatics:  No appreciated cervical or supraclavicular lymphadenoapthy. HEENT:  No scleral injection or icterus. Endotracheal tube in place.  Cardiovascular:  Regular rate. No edema. No appreciable JVD.  Pulmonary:  Good aeration & clear to auscultation bilaterally. Symmetric chest wall rise on ventilator. Abdomen: Soft. Normal bowel sounds. Nondistended.  Musculoskeletal:  Normal bulk. No joint deformity or effusion appreciated. Neurological:  Does not open eyes. Intermittently follows commands on right. Spontaneous moves right, minimally withdrawls in L leg, no movement in left arm. Pupils sluggish 77mm/ disconjugate gaze  LABS:  BMET  Recent Labs Lab 09/30/15 0430 10/01/15 0450 10/02/15 0415  NA 138 141 141  K 3.1* 3.3* 3.7  CL 111 107 104  CO2 19* 27 27  BUN 11 11 13   CREATININE 0.85 0.69 0.74  GLUCOSE 121* 107* 116*   Electrolytes  Recent Labs Lab 09/30/15 0430  10/01/15 0450 10/01/15 1652 10/02/15 0415  CALCIUM 8.1*  --  9.2  --  9.6  MG 2.0  < > 2.0 1.9 1.9  PHOS 3.1  < > 2.8 1.9* 4.3  < > = values in this interval not displayed.  CBC  Recent Labs Lab 09/30/15 0430 10/01/15 0450 10/02/15 0415  WBC 6.5 6.8 7.7  HGB 10.5* 10.3* 10.7*  HCT 32.3* 32.0* 33.2*  PLT 174 143* 165   Coag's  Recent Labs Lab 09/29/15 1500  INR 0.96   Sepsis Markers  Recent Labs Lab 09/29/15 1500 09/29/15 1511  LATICACIDVEN 1.3 1.41   ABG  Recent  Labs Lab 09/29/15 1728 09/30/15 0417 10/02/15 0350  PHART 7.438 7.387 7.446  PCO2ART 27.3* 33.8* 39.2  PO2ART 128* 186* 126*   Liver Enzymes  Recent Labs Lab 09/29/15 1500  AST 47*  ALT 40  ALKPHOS 65  BILITOT 1.2  ALBUMIN 4.3   Cardiac Enzymes  Recent Labs Lab 09/29/15 1500  TROPONINI 0.04*   Glucose  Recent Labs Lab 10/01/15 1146 10/01/15 1529 10/01/15 1955 10/01/15 2333 10/02/15 0330 10/02/15 0742  GLUCAP 112* 126* 132* 135*  132* 121*   Imaging Dg Chest Port 1 View  10/02/2015  CLINICAL DATA:  Endotracheal tube. EXAM: PORTABLE CHEST 1 VIEW COMPARISON:  Radiograph of October 01, 2015. FINDINGS: The heart size and mediastinal contours are within normal limits. Endotracheal and nasogastric tubes are in grossly good position and stable compared to prior exam. No pneumothorax or pleural effusion is noted. Both lungs are clear. The visualized skeletal structures are unremarkable. IMPRESSION: Stable support apparatus. No acute cardiopulmonary abnormality seen. Electronically Signed   By: Marijo Conception, M.D.   On: 10/02/2015 09:06   STUDIES:  CT Head W/O 6/15: Acute intracranial hemorrhage with intraventricular extension and possible developing hydrocephalus Although no fractures identified in the cervical spine, the evaluation of the cervical spine is limited. CT Neck 6/15: Limited evaluation of cervical spine due to motion. No fractures evident. Port CXR 6/15: Silhouetting left hemidiaphragm. Endotracheal tube tip at the level of the carina to nearly entering right mainstem bronchus. Orogastric tube passing below diaphragm. Port CXR 6/16>>ETT 4.5 cm above carina. Atelectasis in lung bases  MICROBIOLOGY: MRSA PCR 6/15 >> (-) Blood Ctx x2 6/15 >>  ANTIBIOTICS: 6/15 Rocephin >> 6/16 Acef (prophylactic abx) >>  SIGNIFICANT EVENTS: 6/15 - Admit to ICU on ventilator for ICH after transfer from [facility] ED  LINES/TUBES: OETT 7.5 6/15 >> Foley 6/15 >> OGT 6/15 >> PIV X3 IVC Drain 6/16 >>   DISCUSSION: 60 year old female past medical history of coronary artery disease, ischemic cardiomyopathy, and polysubstance abuse being admitted with right thalamic intracranial hemorrhage. She is intubated for airway protection and was transferred to [facility] for ICU admission. Will maintain on ventilator with tight blood pressure control with Cardene infusion. Neurology and neurosurgery consulted.  IVC drain placed early am 6/16.     ASSESSMENT / PLAN:  PULMONARY A: Acute Hypoxemic resp failure 2/2 Unable to Protect Airway - Secondary to [place].  P:   Begin PS trials but no extubation given mental status. Wean O2 for sats > 94%. PCXR in am.  CARDIOVASCULAR A:  Chronic Systolic CHF - EF 123456 w/ grade 1 diastolic dysfunction. RV normal. H/O Hypertension H/O CAD - S/P PCI in 2015 H/O Hyperlipidemia  P:  Telemetry monitoring in ICU Tight BP control with SBP goal < 169mmHg No beta blocker as positive for cocaine Holding IV anti-HTN for now. Lipitor VT daily WOF pulm edema since EF is 20%, no echo pending.  RENAL A:   Metabolic Acidosis - Mild.  P:   Monitor renal function and UOP. Strict I&O. Correct electrolytes as indicated. KVO IVF. Replace MgSO4.  GASTROINTESTINAL A:   H/O GERD  P:   TF per nutrition. Pepcid IV QD.  HEMATOLOGIC A:   No acute issues.  P:  Trending cell counts daily w/ CBC. SCDs. No chemical DVT ppx given ICH.  INFECTIOUS A:   No acute evidence of infection. H/O HSV Infection  P:   Follow WBC and fever curve. Prophylactic Abx for Ventriculostomy.  ENDOCRINE A:  No acute issues  P:   Follow CBG on daily BMP  NEUROLOGIC A:   Right Thalamic Intracranial Hemorrhage s/p IVC 6/16 Cocaine Abuse - urine drug screen positive for cocaine on admission H/O EtOH Abuse H/O Depression  P:   RASS goal: 0. Neurology Consult appreciated. Neurosurgery Consult appreciated. Propofol gtt. Fentanyl IV prn. Blood pressure control as above.  FAMILY  - Updates: No family at bedside 6/18.  - Inter-disciplinary family meet or Palliative Care meeting due by:  6/22.  The patient is critically ill with multiple organ systems failure and requires high complexity decision making for assessment and support, frequent evaluation and titration of therapies, application of advanced monitoring technologies and extensive interpretation of multiple databases.   Critical Care Time  devoted to patient care services described in this note is  35  Minutes. This time reflects time of care of this signee Dr Jennet Maduro. This critical care time does not reflect procedure time, or teaching time or supervisory time of PA/NP/Med student/Med Resident etc but could involve care discussion time.  [name], M.D. [facility] Pulmonary/Critical Care Medicine. Pager: [phone]. After hours pager: [phone].

## 2015-10-02 NOTE — Plan of Care (Signed)
Problem: Nutrition: Goal: Dietary intake will improve Outcome: Completed/Met Date Met:  10/02/15 Vital HP started 09/30/15

## 2015-10-03 ENCOUNTER — Inpatient Hospital Stay (HOSPITAL_COMMUNITY): Payer: Medicaid Other

## 2015-10-03 LAB — TRIGLYCERIDES: Triglycerides: 85 mg/dL (ref <150)

## 2015-10-03 LAB — BASIC METABOLIC PANEL
Anion gap: 11 (ref 5–15)
BUN: 23 mg/dL — AB (ref 6–20)
CHLORIDE: 104 mmol/L (ref 101–111)
CO2: 26 mmol/L (ref 22–32)
CREATININE: 0.67 mg/dL (ref 0.44–1.00)
Calcium: 9.6 mg/dL (ref 8.9–10.3)
GFR calc Af Amer: >60 mL/min (ref >60)
GFR calc non Af Amer: >60 mL/min (ref >60)
GLUCOSE: 111 mg/dL — AB (ref 65–99)
Potassium: 3.4 mmol/L — ABNORMAL LOW (ref 3.5–5.1)
SODIUM: 141 mmol/L (ref 135–145)

## 2015-10-03 LAB — GLUCOSE, CAPILLARY
GLUCOSE-CAPILLARY: 113 mg/dL — AB (ref 65–99)
GLUCOSE-CAPILLARY: 119 mg/dL — AB (ref 65–99)
GLUCOSE-CAPILLARY: 101 mg/dL — AB (ref 65–99)
GLUCOSE-CAPILLARY: 120 mg/dL — AB (ref 65–99)
GLUCOSE-CAPILLARY: 120 mg/dL — AB (ref 65–99)
Glucose-Capillary: 117 mg/dL — ABNORMAL HIGH (ref 65–99)
Glucose-Capillary: 134 mg/dL — ABNORMAL HIGH (ref 65–99)

## 2015-10-03 LAB — BLOOD GAS, ARTERIAL
ACID-BASE EXCESS: 5.3 mmol/L — AB (ref 0.0–2.0)
BICARBONATE: 28.8 meq/L — AB (ref 20.0–24.0)
Drawn by: 283401
FIO2: 40
LHR: 14 {breaths}/min
O2 SAT: 98.6 %
PATIENT TEMPERATURE: 98.6
PCO2 ART: 38.7 mmHg (ref 35.0–45.0)
PEEP: 5 cmH2O
PH ART: 7.484 — AB (ref 7.350–7.450)
TCO2: 30 mmol/L (ref 0–100)
VT: 420 mL
pO2, Arterial: 139 mmHg — ABNORMAL HIGH (ref 80.0–100.0)

## 2015-10-03 LAB — HEMOGLOBIN A1C
Hgb A1c MFr Bld: 5.6 % (ref 4.8–5.6)
Mean Plasma Glucose: 114 mg/dL

## 2015-10-03 LAB — CBC
HCT: 31.1 % — ABNORMAL LOW (ref 36.0–46.0)
HEMOGLOBIN: 10 g/dL — AB (ref 12.0–15.0)
MCH: 31.3 pg (ref 26.0–34.0)
MCHC: 32.2 g/dL (ref 30.0–36.0)
MCV: 97.2 fL (ref 78.0–100.0)
PLATELETS: 184 10*3/uL (ref 150–400)
RBC: 3.2 MIL/uL — ABNORMAL LOW (ref 3.87–5.11)
RDW: 13.5 % (ref 11.5–15.5)
WBC: 6.5 10*3/uL (ref 4.0–10.5)

## 2015-10-03 LAB — MAGNESIUM: Magnesium: 2.4 mg/dL (ref 1.7–2.4)

## 2015-10-03 LAB — PHOSPHORUS: Phosphorus: 5.6 mg/dL — ABNORMAL HIGH (ref 2.5–4.6)

## 2015-10-03 MED ORDER — HEPARIN SODIUM (PORCINE) 5000 UNIT/ML IJ SOLN
5000.0000 [IU] | Freq: Two times a day (BID) | INTRAMUSCULAR | Status: DC
  Administered 2015-10-03 – 2015-11-10 (×73): 5000 [IU] via SUBCUTANEOUS
  Filled 2015-10-03 (×73): qty 1

## 2015-10-03 MED ORDER — POTASSIUM CHLORIDE 20 MEQ/15ML (10%) PO SOLN
40.0000 meq | Freq: Once | ORAL | Status: AC
  Administered 2015-10-03: 40 meq
  Filled 2015-10-03: qty 30

## 2015-10-03 MED ORDER — HEPARIN SODIUM (PORCINE) 5000 UNIT/ML IJ SOLN: 5000.0000 [IU] | Freq: Three times a day (TID) | INTRAMUSCULAR | Status: DC

## 2015-10-03 MED ORDER — VITAL AF 1.2 CAL PO LIQD
1000.0000 mL | ORAL | Status: DC
  Administered 2015-10-03 – 2015-10-05 (×3): 1000 mL

## 2015-10-03 NOTE — Progress Notes (Signed)
Patient's drain stopped draining around 0200 with Ander Purpura, RN. CT was done. IVC not draining this A.M after handoff from nurse. Reported to Dr. Kathyrn Sheriff during rounds.   1700 Patient is stable. She continues to follow commands on right side. Drain is not pulsating or draining. Dr. Saintclair Halsted notified.

## 2015-10-03 NOTE — Progress Notes (Signed)
No issues overnight.   EXAM:  BP 118/55 mmHg  Pulse 70  Temp(Src) 98.1 F (36.7 C) (Axillary)  Resp 14  Ht 5\' 3"  (1.6 m)  Wt 46.1 kg (101 lb 10.1 oz)  BMI 18.01 kg/m2  SpO2 100%  On propofol Will open eyes Moves toes/fingers on right, no movement on Left EVD in place  CT head reviewed, appears to demonstrate stable ventricle size, no new hemorrhage.  IMPRESSION:  60 y.o. female s/p right thalamic hemorrhage, largely unchanged clinically  PLAN: - Cont current mgmt - Attempt to wean propofol, may need to add antihypertensives. - Cont EVD

## 2015-10-03 NOTE — Plan of Care (Signed)
Problem: Nutrition: Goal: Risk of aspiration will decrease Outcome: Not Progressing Patient still on tube feeding

## 2015-10-03 NOTE — Progress Notes (Signed)
PULMONARY / CRITICAL CARE MEDICINE   Name: Joanna Farmer MRN: [id number] DOB: 1956-03-16    ADMISSION DATE:  09/29/2015 CONSULTATION DATE:  09/29/2015  REFERRING MD:  Ezequiel Essex, M.D. / [facility] EDP  CHIEF COMPLAINT:  Altered mental status  HISTORY OF PRESENT ILLNESS:   60 year old female past medical history as below, which is significant for hypertension, alcohol abuse, cocaine abuse, HSV, coronary artery disease status post stents to RCA and LAD in 2015, and ischemic cardiomyopathy with LV EF 35-40%. She presented to [facility] emergency department 6/15 with altered mental status. Reportedly the night before she suffered a fall during the night, and since that time has been sleeping all day. She was brought to the emergency department with this complaint. In emergency department she was unresponsive but reportedly breathing adequately. She had no spontaneous movement and left gaze deviation, but did have a positive gag reflex. CT scan of the head was obtained and demonstrated acute intracranial hemorrhage with intraventricular extension. Cervical spine was without radiographic evidence of injury. She was intubated for airway protection with anticipation of transfer to [facility]. PCCM accepting.   SUBJECTIVE:  No events overnight, opened eyes earlier per family.  Not following commands yet.  VITAL SIGNS: BP 128/56 mmHg  Pulse 75  Temp(Src) 98.1 F (36.7 C) (Axillary)  Resp 9  Ht 5\' 3"  (1.6 m)  Wt 101 lb 10.1 oz (46.1 kg)  BMI 18.01 kg/m2  SpO2 100%  HEMODYNAMICS:    VENTILATOR SETTINGS: Vent Mode:  [-] PRVC FiO2 (%):  [40 %] 40 % Set Rate:  [14 bmp] 14 bmp Vt Set:  [420 mL] 420 mL PEEP:  [5 cmH20] 5 cmH20 Pressure Support:  [5 cmH20] 5 cmH20 Plateau Pressure:  [14 cmH20-15 cmH20] 14 cmH20  INTAKE / OUTPUT: I/O last 3 completed shifts: In: 2056 [I.V.:946; NG/GT:1110] Out: 1396 [Urine:1270; Drains:126]  PHYSICAL EXAMINATION: General:  Sedated. No acute  distress. Integument:  Warm & dry. No rash on exposed skin.  HEENT:  Drain in place. No scleral injection or icterus. Endotracheal tube in place.  Cardiovascular:  RRR, no M/R/G. Pulmonary:  Coarse bilaterally. Fair ae bilaterally.  Abdomen: Soft. Normal bowel sounds. Nondistended.  Musculoskeletal:  Normal bulk. No joint deformity or effusion appreciated. Neurological:  Sedated, does not follow commands.  LABS:  BMET  Recent Labs Lab 10/01/15 0450 10/02/15 0415 10/03/15 0344  NA 141 141 141  K 3.3* 3.7 3.4*  CL 107 104 104  CO2 27 27 26   BUN 11 13 23*  CREATININE 0.69 0.74 0.67  GLUCOSE 107* 116* 111*   Electrolytes  Recent Labs Lab 10/01/15 0450 10/01/15 1652 10/02/15 0415 10/03/15 0344  CALCIUM 9.2  --  9.6 9.6  MG 2.0 1.9 1.9 2.4  PHOS 2.8 1.9* 4.3 5.6*    CBC  Recent Labs Lab 10/01/15 0450 10/02/15 0415 10/03/15 0344  WBC 6.8 7.7 6.5  HGB 10.3* 10.7* 10.0*  HCT 32.0* 33.2* 31.1*  PLT 143* 165 184   Coag's  Recent Labs Lab 09/29/15 1500  INR 0.96   Sepsis Markers  Recent Labs Lab 09/29/15 1500 09/29/15 1511  LATICACIDVEN 1.3 1.41   ABG  Recent Labs Lab 09/30/15 0417 10/02/15 0350 10/03/15 0403  PHART 7.387 7.446 7.484*  PCO2ART 33.8* 39.2 38.7  PO2ART 186* 126* 139*   Liver Enzymes  Recent Labs Lab 09/29/15 1500  AST 47*  ALT 40  ALKPHOS 65  BILITOT 1.2  ALBUMIN 4.3   Cardiac Enzymes  Recent Labs  Lab 09/29/15 1500  TROPONINI 0.04*   Glucose  Recent Labs Lab 10/02/15 0742 10/02/15 1133 10/02/15 1547 10/02/15 1934 10/02/15 2339 10/03/15 0402  GLUCAP 121* 131* 118* 137* 134* 113*   Imaging Ct Head Wo Contrast  10/03/2015  CLINICAL DATA:  Initial evaluation for acute change in mental status. EXAM: CT HEAD WITHOUT CONTRAST TECHNIQUE: Contiguous axial images were obtained from the base of the skull through the vertex without intravenous contrast. COMPARISON:  Prior CT from 09/29/2015. FINDINGS: Acute  parenchymal hemorrhage centered at the right thalamus measures 31 x 27 x 16 mm (estimated volume 6.7 cc) overall, size and morphology is not significantly changed relative to prior CT. Slightly increased localized mass effect. Intraventricular extension again seen with blood present within the lateral and third ventricles. Overall, degree of intraventricular hemorrhage is fairly small, and likely not significant changed relative to prior. Ventricular catheter in place extending via a right frontal approach with tip near the foramen of Monro. Ventricular dilatation is stable to slightly improved relative to most recent CT from 09/29/2015, most evident at the temporal horns. No evidence for ventricular trapping. No midline shift. No new intracranial hemorrhage. No other acute large vessel territory infarct. No extra-axial fluid collection. Skin staples present at the right frontal scalp associated with the ventricular catheter. Scalp soft tissues otherwise unremarkable. No acute abnormality about the globes and orbits. Mild mucosal thickening within the right sphenoid sinus. Paranasal sinuses are otherwise largely clear. No mastoid effusion. Burr hole at the right frontal calvarium related to the ventricular catheter. Calvarium otherwise intact. IMPRESSION: 1. No significant interval change in size of right thalamic parenchymal hemorrhage (estimated volume 6.7 cc), although localized edema has mildly increased. No significant mass effect. 2. Similar intraventricular extension with small volume intraventricular hemorrhage. 3. Right frontal approach ventricular catheter in place with tip at the foramen of Monro. Overall ventricular size is felt to be stable to slightly decreased relative to 09/29/2015. 4. No other new acute intracranial process. Electronically Signed   By: Jeannine Boga M.D.   On: 10/03/2015 06:59   Dg Chest Port 1 View  10/03/2015  CLINICAL DATA:  Respiratory difficulty EXAM: PORTABLE CHEST 1  VIEW COMPARISON:  Yesterday FINDINGS: Tubular devices are stable. Minimal subsegmental atelectasis at the retrocardiac left base. Lungs otherwise clear. Normal heart size. No pneumothorax. IMPRESSION: Stable.  Subsegmental atelectasis at the left base. Electronically Signed   By: Marybelle Killings M.D.   On: 10/03/2015 07:33   STUDIES:  CT Head W/O 6/15: Acute intracranial hemorrhage with intraventricular extension and possible developing hydrocephalus Although no fractures identified in the cervical spine, the evaluation of the cervical spine is limited. CT Neck 6/15: Limited evaluation of cervical spine due to motion. No fractures evident. Port CXR 6/15: Silhouetting left hemidiaphragm. Endotracheal tube tip at the level of the carina to nearly entering right mainstem bronchus. Orogastric tube passing below diaphragm. Port CXR 6/16>>ETT 4.5 cm above carina. Atelectasis in lung bases CT head 6/19 > no significant change since compared to prior scan.  MICROBIOLOGY: MRSA PCR 6/15 >> (-) Blood Ctx x2 6/15 >> (-)  ANTIBIOTICS: 6/15 Rocephin >>6/18 6/16 Acef (prophylactic abx) >> 6/18  SIGNIFICANT EVENTS: 6/15 - Admit to ICU on ventilator for ICH after transfer from [facility] ED  LINES/TUBES: OETT 7.5 6/15 >> Foley 6/15 >> OGT 6/15 >> PIV X3 IVC Drain 6/16 >>   DISCUSSION: 60 year old female past medical history of coronary artery disease, ischemic cardiomyopathy, and polysubstance abuse being admitted with right thalamic  intracranial hemorrhage. She is intubated for airway protection and was transferred to [facility] for ICU admission. Will maintain on ventilator with tight blood pressure control with Cardene infusion. Neurology and neurosurgery consulted.  IVC drain placed early am 6/16.    ASSESSMENT / PLAN:  PULMONARY A: Acute Hypoxemic resp failure 2/2 Unable to Protect Airway - Secondary to [place]. P:   Begin PS trials when pt wakes up Wean O2 for sats > 94% PCXR in am  CARDIOVASCULAR A:   Chronic Systolic CHF - EF 123456 w/ grade 1 diastolic dysfunction. RV normal. H/O Hypertension H/O CAD - S/P PCI in 2015 H/O Hyperlipidemia P:  Tight BP control with SBP goal < 155mmHg Continue clevidipine per NSGY Continue carvedilol, HCTZ, losartan  RENAL A:   Metabolic Acidosis - resolved Hypokalemia - s/p repletion 6/19. P:   Monitor renal function and UOP Strict I&O Correct electrolytes as indicated KVO IVF  GASTROINTESTINAL A:   H/O GERD P:   TF per nutrition Pepcid IV QD  HEMATOLOGIC A:   No acute issues. P:  Follow CBC SCDs No chemical DVT ppx given ICH  INFECTIOUS A:   No acute evidence of infection. H/O HSV Infection P:   Follow WBC and fever curve Prophylactic Abx for Ventriculostomy > this has been dc'd/  ENDOCRINE A:   No acute issues P:   Follow glucose on BMP  NEUROLOGIC A:   Right Thalamic Intracranial Hemorrhage s/p IVC 6/16 Cocaine Abuse - urine drug screen positive for cocaine on admission H/O EtOH Abuse H/O Depression P:  Sedation: Propofol gtt / Fentanyl PRN. RASS goal: 0. Neurology assistance appreciated. Neurosurgery assistance appreciated. Blood pressure control as above.  FAMILY  - Updates: Multiple family updated at bedside 6/19.  CC time: 35 minutes.   Montey Hora, [place] - [facility] [place] [facility] Pager: [phone]  or [phone] 10/03/2015, 10:42 AM   ATTENDING NOTE / ATTESTATION NOTE :   I have discussed the case with the resident/APP Rahul Desai.  I agree with the resident/APP's  history, physical examination, assessment, and plans.  I have edited the above note and modified it according to our agreed history, physical examination, assessment and plan.   Pt admitted for ICH 2/2 cocaine use. Has a ventriculsotomy tube. Begin PST when pt wakes up.   I have spent 35  minutes of critical care time with this patient today.  Family :Family updated at length today.  Spoke to pts daughter  at bedside.    Monica Becton, MD 10/03/2015, 1:16 PM [place] [facility] Pager [phone] After 3 pm or if no answer, call [phone]

## 2015-10-03 NOTE — Progress Notes (Signed)
Nutrition Follow-up  INTERVENTION:   D/C Vital High Protein and Prostat  Vital AF 1.2 @ 40 ml/hr Provides: 1152 kcal (98% of needs), 72 grams protein, and 778 ml H2O.   NUTRITION DIAGNOSIS:   Inadequate oral intake related to inability to eat as evidenced by NPO status. Ongoing.   GOAL:   Patient will meet greater than or equal to 90% of their needs Progressing.   MONITOR:   TF tolerance, Vent status, Labs  ASSESSMENT:   Pt with past medical history of coronary artery disease, ischemic cardiomyopathy, and polysubstance abuse being admitted with right thalamic intracranial hemorrhage, right frontal external ventricular drain placed 6/16.   6/16 IVC drain Remains on vent. Has cleviprex ordered but not infusing currently.   Patient is currently intubated on ventilator support MV: 6.1 L/min Temp (24hrs), Avg:98.6 F (37 C), Min:98.1 F (36.7 C), Max:99.1 F (37.3 C)  Propofol: 5.7 ml/hr provide: 150 kcal per day from lipid Medications reviewed and include: MVI Labs reviewed: K+ 3.4, PO4 5.6 OG tube   Diet Order:  Diet NPO time specified  Skin:  Reviewed, no issues  Last BM:  6/18  Height:   Ht Readings from Last 1 Encounters:  09/29/15 5\' 3"  (1.6 m)    Weight:   Wt Readings from Last 1 Encounters:  10/03/15 101 lb 10.1 oz (46.1 kg)    Ideal Body Weight:  52.2 kg  BMI:  Body mass index is 18.01 kg/(m^2).  Estimated Nutritional Needs:   Kcal:  1170  Protein:  70-85 grams  Fluid:  > 1.5 L/day  EDUCATION NEEDS:   No education needs identified at this time  [place], [place], [place] Pager [phone] After Hours Pager

## 2015-10-03 NOTE — Progress Notes (Signed)
Met with pt's son and daughter, per their request.  They have questions about long term disposition, which I explained we frankly do not know yet.  Disposition will depend on how well pt does once extubated, and usually is determined by PT/OT assessment.  They seem to understand this.  Son lives in [place], and daughter in [place].  They are interested in possibly using the [facility], if available.  Spoke with [employer]; they stated the house is in use presently, but will be available on Wednesday.  Chaplain staff to meet with son and daughter before 2pm today, if possible to get their information and give them information on [facility].    Reinaldo Raddle, RN, BSN  Trauma/Neuro ICU Case Manager [phone]

## 2015-10-03 NOTE — Progress Notes (Signed)
   10/03/15 1600  Clinical Encounter Type  Visited With Family  Visit Type Social support;Initial  Referral From Nurse;Social work  Spiritual Encounters  Spiritual Needs Other (Comment)  Stress Factors  Family Stress Factors Other (Comment) (Housing)  Chaplain responded to a spiritual care consult.  Patient's family wanted to inquire as to residential facilities.  After Chaplain consulted with  The Spiritual Care office chaplain shared with the family member Ethlyn Daniels that at present all residences were at capacity.  Chaplain also shared there may be availibility on Wednesday.  Ms. Nicki Reaper responded that she was told there was available housing by the Education officer, museum.  Chaplain shared there is a wait list for housing and offered to add her name.   After further conversation, Ms. Nicki Reaper mentioned she had already submitted her name.  Chaplain asked Ms. Scott if she could be of further service to her or her family.

## 2015-10-03 NOTE — Progress Notes (Signed)
[facility] ADULT ICU REPLACEMENT PROTOCOL FOR AM LAB REPLACEMENT ONLY  The patient does not apply for the [facility] Adult ICU Electrolyte Replacment Protocol based on the criteria listed below:   1. Is GFR >/= 40 ml/min? Yes.    Patient's GFR today is >60 2. Is urine output >/= 0.5 ml/kg/hr for the last 6 hours? No. Patient's UOP is 0.38 ml/kg/hr 3. Is BUN < 60 mg/dL? Yes.    Patient's BUN today is 23 4. Abnormal electrolyte(s): K+3.4 5. Ordered repletion with: NA 6. If a panic level lab has been reported, has the CCM MD in charge been notified? Yes.  .   Physician:  Joni Reining [facility] 10/03/2015 6:27 AM

## 2015-10-03 NOTE — Progress Notes (Signed)
STROKE TEAM PROGRESS NOTE   SUBJECTIVE (INTERVAL HISTORY) No family present. Dr. Kathyrn Sheriff has already rounded this am. Off Cleviprex. Remains on propofol. If propofol decreased, BP goes beyond goal < 160. CT this am stable. EVD drainage minimal. UOP decreased with 10cc/hr of LR. Theresa for some IVF.    OBJECTIVE Temp:  [98.6 F (37 C)-99.5 F (37.5 C)] 98.8 F (37.1 C) (06/19 0400) Pulse Rate:  [57-116] 61 (06/19 0805) Cardiac Rhythm:  [-] Normal sinus rhythm (06/19 0800) Resp:  [9-50] 14 (06/19 0805) BP: (103-185)/(52-85) 185/85 mmHg (06/19 0805) SpO2:  [99 %-100 %] 100 % (06/19 0805) FiO2 (%):  [40 %] 40 % (06/19 0805) Weight:  [46.1 kg (101 lb 10.1 oz)] 46.1 kg (101 lb 10.1 oz) (06/19 0500)  CBC:  Recent Labs Lab 09/29/15 1500  10/02/15 0415 10/03/15 0344  WBC 7.4  < > 7.7 6.5  NEUTROABS 6.1  --   --   --   HGB 13.5  < > 10.7* 10.0*  HCT 39.1  < > 33.2* 31.1*  MCV 96.5  < > 97.6 97.2  PLT 211  < > 165 184  < > = values in this interval not displayed.  Basic Metabolic Panel:   Recent Labs Lab 10/02/15 0415 10/03/15 0344  NA 141 141  K 3.7 3.4*  CL 104 104  CO2 27 26  GLUCOSE 116* 111*  BUN 13 23*  CREATININE 0.74 0.67  CALCIUM 9.6 9.6  MG 1.9 2.4  PHOS 4.3 5.6*    Lipid Panel:     Component Value Date/Time   CHOL 134 10/01/2015 0450   TRIG 85 10/03/2015 0344   HDL 77 10/01/2015 0450   CHOLHDL 1.7 10/01/2015 0450   VLDL 24 10/01/2015 0450   LDLCALC 33 10/01/2015 0450   HgbA1c: No results found for: HGBA1C Urine Drug Screen:     Component Value Date/Time   LABOPIA NONE DETECTED 09/30/2015 1816   COCAINSCRNUR POSITIVE* 09/30/2015 1816   LABBENZ POSITIVE* 09/30/2015 1816   AMPHETMU NONE DETECTED 09/30/2015 1816   THCU NONE DETECTED 09/30/2015 1816   LABBARB NONE DETECTED 09/30/2015 1816      IMAGING  Ct Angio Head and neck W Or Wo Contrast 09/30/2015   1. No evidence of major intracranial arterial occlusion, aneurysm, or flow limiting proximal  stenosis.  2. Intracranial atherosclerosis with mild right ICA, mild left M2, and mild bilateral P2 narrowing.  3. Unchanged 3 cm right thalamic hemorrhage. Small volume intraventricular hemorrhage, slightly decreased from prior.  4. 50% proximal right ICA stenosis.  5. Mild proximal right vertebral artery stenosis.   Ct Head Wo Contrast 10/03/2015 1. No significant interval change in size of right thalamic parenchymal hemorrhage (estimated volume 6.7 cc), although localized edema has mildly increased. No significant mass effect. 2. Similar intraventricular extension with small volume intraventricular hemorrhage. 3. Right frontal approach ventricular catheter in place with tip at the foramen of Monro. Overall ventricular size is felt to be stable to slightly decreased relative to 09/29/2015. 4. No other new acute intracranial process.  09/29/2015   Acute intracranial hemorrhage with intraventricular extension and possible developing hydrocephalus Although no fractures identified in the cervical spine, the evaluation of the cervical spine is limited.   Ct Cervical Spine Wo Contrast 09/29/2015   Acute intracranial hemorrhage with intraventricular extension and possible developing hydrocephalus Although no fractures identified in the cervical spine, the evaluation of the cervical spine is limited.   Dg Chest Port 1 View 09/30/2015  Appliances appear in satisfactory position. No central venous catheter is identified. Atelectasis in the lung bases.   Dg Chest Port 1 View 10/03/2015 Stable. Subsegmental atelectasis at the left base. 10/02/2015 Stable support apparatus. No acute cardiopulmonary abnormality seen. 10/01/2015 1. Support structures as described. No pneumothorax 2. Mild left lung base opacity, slightly increased, favor mild Atelectasis. 09/30/2015 Appliances appear in satisfactory position. No central venous catheter is identified. Atelectasis in the lung bases. 09/29/2015 Appliances in  satisfactory position. Atelectasis in the left lung base.  TTE  - Left ventricle: The cavity size was mildly dilated. Wall  thickness was increased in a pattern of moderate LVH. Systolic function was normal. The estimated ejection fraction was in the range of 60% to 65%. Wall motion was normal; there were no regional wall motion abnormalities. Doppler parameters are consistent with both elevated ventricular end-diastolic filling pressure and elevated left atrial filling pressure. - Atrial septum: No defect or patent foramen ovale was identified.   PHYSICAL EXAM Temp:  [98.6 F (37 C)-99.5 F (37.5 C)] 98.8 F (37.1 C) (06/19 0400) Pulse Rate:  [57-116] 61 (06/19 0805) Resp:  [9-50] 14 (06/19 0805) BP: (103-185)/(52-85) 185/85 mmHg (06/19 0805) SpO2:  [99 %-100 %] 100 % (06/19 0805) FiO2 (%):  [40 %] 40 % (06/19 0805) Weight:  [46.1 kg (101 lb 10.1 oz)] 46.1 kg (101 lb 10.1 oz) (06/19 0500)  General - Well nourished, well developed, intubated.  Ophthalmologic - Fundi not visualized due to ET positioning.  Cardiovascular - Regular rate and rhythm.  Neuro - intubated on vent and propofol, eyes not open, not following commands. PERRL, eyes in the middle position, left eye downward disconjugation, doll's eye present, positive gag and corneal. Facial droop not able to evaluate due to ET tube. Right UE and LE spontaneous movement. On pain stimulation, strong against gravity on the right UE and LE, left UE 0/5 and LLE 3/5. Babinski positive bilaterally, DTR 1+. Sensation, gait and coordination not tested.    ASSESSMENT/PLAN [name] is a 60 y.o. female with history of hypertension, hyperlipidemia, alcohol abuse, cocaine abuse, herpes simplex, coronary artery disease with previous MI and stents, and cardiomyopathy  presenting with altered mental status and a fall. She did not receive IV t-PA due to intracranial hemorrhage.  ICH:  Non-dominant right BG/thalamic/hypothalamic ICH with  IVH s/p EVD placement likely secondary to HTN.  Resultant  Left hemiparesis, respiratory failure  MRI MRA not performed  CTA head and neck - stable ICH, right proximal ICA 50% stenosis  Repeat CT head this am stable   2D Echo EF 60-65%  LDL - 33  HgbA1c 5.6  UDS cocaine positive  VTE prophylaxis - SCDs and subq heparin Q12h Diet NPO time specified. Has tube feedings, no free water  aspirin 81 mg daily and clopidogrel 75 mg daily prior to admission, now on No antithrombotic secondary to hemorrhage.  Ongoing aggressive stroke risk factor management  Increase LR to 40/h  Therapy recommendations: Pending  Disposition: Pending  Respiratory failure  Intubated on vent  CCM on board  Wean off vent as able  Hypertensive emergency  Off cleviprex this am, BP stays within goal as long as propofol not adjusted  BP goal < 160  Back on home BP meds - on cozaar and HCTZ, coreg  monitor  Hyperlipidemia  Home meds: Lipitor 80 mg daily - not resumed in the hospital secondary to [place].  LDL 33, goal < 70  Resume statin at discharge  Cocaine abuse  UDS positive for cocaine  Hx of cocaine use  Other Stroke Risk Factors  Advanced age  Cigarette smoker - advised to stop smoking  ETOH use, advised to drink no more than 1-2 drinks per day.  Coronary artery disease s/p stent - on home DAPT with high dose lipitor  Cardiomyopathy - EF 20% in 01/2015 but this admission EF 60-65%  Other Active Problems  Anemia - hemoglobin 10.3; hematocrit 32  Hypokalemia - supplemented -> 3.7  Decreased UOP, IVF increased to 75ml/hr  Hospital day # [place] for Pager information 10/03/2015 9:29 AM   This patient is critically ill due to [place] with IVH s/p EVD and hypertension and intubation and at significant risk of neurological worsening, death form hematoma expansion, hydrocephalus, heart failure, cerebral edema and herniation. This  patient's care requires constant monitoring of vital signs, hemodynamics, respiratory and cardiac monitoring, review of multiple databases, neurological assessment, discussion with family, other specialists and medical decision making of high complexity. I spent 35 minutes of neurocritical care time in the care of this patient.  Pt still intubated, did not pass weaning trials today. Decreased UOP, will increase IVF. Off cleviprex and BP stable. Continue home BP meds now. OK to have subq heparin for DVt prophylaxis.  Rosalin Hawking, MD PhD Stroke Neurology 10/03/2015 12:42 PM     To contact Stroke Continuity provider, please refer to [email or web address] After hours, contact General Neurology

## 2015-10-04 ENCOUNTER — Inpatient Hospital Stay (HOSPITAL_COMMUNITY): Payer: Medicaid Other

## 2015-10-04 ENCOUNTER — Encounter (HOSPITAL_COMMUNITY): Payer: Self-pay | Admitting: Radiology

## 2015-10-04 LAB — CULTURE, BLOOD (ROUTINE X 2)
Culture: NO GROWTH
Culture: NO GROWTH

## 2015-10-04 LAB — CBC
HCT: 30.4 % — ABNORMAL LOW (ref 36.0–46.0)
Hemoglobin: 9.7 g/dL — ABNORMAL LOW (ref 12.0–15.0)
MCH: 31.6 pg (ref 26.0–34.0)
MCHC: 31.9 g/dL (ref 30.0–36.0)
MCV: 99 fL (ref 78.0–100.0)
PLATELETS: 203 10*3/uL (ref 150–400)
RBC: 3.07 MIL/uL — ABNORMAL LOW (ref 3.87–5.11)
RDW: 13.9 % (ref 11.5–15.5)
WBC: 7.9 10*3/uL (ref 4.0–10.5)

## 2015-10-04 LAB — BASIC METABOLIC PANEL
Anion gap: 8 (ref 5–15)
BUN: 21 mg/dL — AB (ref 6–20)
CHLORIDE: 104 mmol/L (ref 101–111)
CO2: 29 mmol/L (ref 22–32)
CREATININE: 0.64 mg/dL (ref 0.44–1.00)
Calcium: 9.8 mg/dL (ref 8.9–10.3)
GFR calc Af Amer: >60 mL/min (ref >60)
GFR calc non Af Amer: >60 mL/min (ref >60)
Glucose, Bld: 111 mg/dL — ABNORMAL HIGH (ref 65–99)
Potassium: 4 mmol/L (ref 3.5–5.1)
Sodium: 141 mmol/L (ref 135–145)

## 2015-10-04 LAB — URINALYSIS, ROUTINE W REFLEX MICROSCOPIC
BILIRUBIN URINE: NEGATIVE
GLUCOSE, UA: NEGATIVE mg/dL
Hgb urine dipstick: NEGATIVE
Ketones, ur: NEGATIVE mg/dL
Leukocytes, UA: NEGATIVE
NITRITE: NEGATIVE
PH: 7 (ref 5.0–8.0)
Protein, ur: NEGATIVE mg/dL
SPECIFIC GRAVITY, URINE: 1.016 (ref 1.005–1.030)

## 2015-10-04 LAB — CSF CELL COUNT WITH DIFFERENTIAL
Eosinophils, CSF: 0 % (ref 0–1)
LYMPHS CSF: 22 % — AB (ref 40–80)
Monocyte-Macrophage-Spinal Fluid: 11 % — ABNORMAL LOW (ref 15–45)
RBC COUNT CSF: 27000 /mm3 — AB
SEGMENTED NEUTROPHILS-CSF: 67 % — AB (ref 0–6)
WBC, CSF: 11 /mm3 (ref 0–5)

## 2015-10-04 LAB — MAGNESIUM: Magnesium: 2.2 mg/dL (ref 1.7–2.4)

## 2015-10-04 LAB — TRIGLYCERIDES: Triglycerides: 109 mg/dL (ref <150)

## 2015-10-04 LAB — PHOSPHORUS: Phosphorus: 4.5 mg/dL (ref 2.5–4.6)

## 2015-10-04 LAB — GLUCOSE, CAPILLARY
GLUCOSE-CAPILLARY: 126 mg/dL — AB (ref 65–99)
GLUCOSE-CAPILLARY: 131 mg/dL — AB (ref 65–99)
GLUCOSE-CAPILLARY: 116 mg/dL — AB (ref 65–99)
Glucose-Capillary: 132 mg/dL — ABNORMAL HIGH (ref 65–99)
Glucose-Capillary: 130 mg/dL — ABNORMAL HIGH (ref 65–99)

## 2015-10-04 LAB — PROTEIN AND GLUCOSE, CSF
Glucose, CSF: 73 mg/dL — ABNORMAL HIGH (ref 40–70)
Total  Protein, CSF: 41 mg/dL (ref 15–45)

## 2015-10-04 MED ORDER — LOSARTAN POTASSIUM 50 MG PO TABS
50.0000 mg | ORAL_TABLET | Freq: Once | ORAL | Status: AC
  Administered 2015-10-04: 50 mg via ORAL
  Filled 2015-10-04: qty 1

## 2015-10-04 MED ORDER — ACETAMINOPHEN 160 MG/5ML PO SOLN
650.0000 mg | Freq: Four times a day (QID) | ORAL | Status: DC | PRN
  Administered 2015-10-04 – 2015-10-12 (×9): 650 mg
  Filled 2015-10-04 (×11): qty 20.3

## 2015-10-04 MED ORDER — HYDRALAZINE HCL 20 MG/ML IJ SOLN
10.0000 mg | INTRAMUSCULAR | Status: DC | PRN
  Administered 2015-10-05 – 2015-10-08 (×5): 10 mg via INTRAVENOUS
  Filled 2015-10-04 (×5): qty 1

## 2015-10-04 MED ORDER — LOSARTAN POTASSIUM 50 MG PO TABS
100.0000 mg | ORAL_TABLET | Freq: Every day | ORAL | Status: DC
  Administered 2015-10-05: 100 mg via ORAL
  Filled 2015-10-04: qty 2

## 2015-10-04 MED ORDER — VANCOMYCIN HCL IN DEXTROSE 1-5 GM/200ML-% IV SOLN
1000.0000 mg | INTRAVENOUS | Status: DC
  Administered 2015-10-04 – 2015-10-05 (×2): 1000 mg via INTRAVENOUS
  Filled 2015-10-04 (×3): qty 200

## 2015-10-04 MED ORDER — HYDRALAZINE HCL 20 MG/ML IJ SOLN
10.0000 mg | INTRAMUSCULAR | Status: DC | PRN
  Administered 2015-10-04: 10 mg via INTRAVENOUS
  Filled 2015-10-04: qty 1

## 2015-10-04 MED ORDER — HYDROCHLOROTHIAZIDE 25 MG PO TABS
25.0000 mg | ORAL_TABLET | Freq: Every day | ORAL | Status: DC
  Administered 2015-10-05 – 2015-10-10 (×6): 25 mg via ORAL
  Filled 2015-10-04 (×6): qty 1

## 2015-10-04 MED ORDER — PIPERACILLIN-TAZOBACTAM 3.375 G IVPB
3.3750 g | Freq: Three times a day (TID) | INTRAVENOUS | Status: DC
  Administered 2015-10-04 – 2015-10-16 (×36): 3.375 g via INTRAVENOUS
  Filled 2015-10-04 (×38): qty 50

## 2015-10-04 NOTE — Progress Notes (Signed)
Sputum culture collected, sent to lab.  

## 2015-10-04 NOTE — Progress Notes (Signed)
STROKE TEAM PROGRESS NOTE   SUBJECTIVE (INTERVAL HISTORY) Boyfriend at bedside. EVD drain has been increased to 15, minimal drainage per RN. CT from yesterday is stable. BP remains elevated. On weaning trials.    OBJECTIVE Temp:  [97.9 F (36.6 C)-100.4 F (38 C)] 99.3 F (37.4 C) (06/20 0800) Pulse Rate:  [58-81] 81 (06/20 0820) Cardiac Rhythm:  [-] Normal sinus rhythm (06/19 2000) Resp:  [8-25] 15 (06/20 0820) BP: (102-174)/(49-77) 144/55 mmHg (06/20 0820) SpO2:  [100 %] 100 % (06/20 0820) FiO2 (%):  [40 %] 40 % (06/20 0820) Weight:  [44.6 kg (98 lb 5.2 oz)] 44.6 kg (98 lb 5.2 oz) (06/20 0600)  CBC:  Recent Labs Lab 09/29/15 1500  10/03/15 0344 10/04/15 0359  WBC 7.4  < > 6.5 7.9  NEUTROABS 6.1  --   --   --   HGB 13.5  < > 10.0* 9.7*  HCT 39.1  < > 31.1* 30.4*  MCV 96.5  < > 97.2 99.0  PLT 211  < > 184 203  < > = values in this interval not displayed.  Basic Metabolic Panel:   Recent Labs Lab 10/03/15 0344 10/04/15 0359  NA 141 141  K 3.4* 4.0  CL 104 104  CO2 26 29  GLUCOSE 111* 111*  BUN 23* 21*  CREATININE 0.67 0.64  CALCIUM 9.6 9.8  MG 2.4 2.2  PHOS 5.6* 4.5    Lipid Panel:     Component Value Date/Time   CHOL 134 10/01/2015 0450   TRIG 109 10/04/2015 0359   HDL 77 10/01/2015 0450   CHOLHDL 1.7 10/01/2015 0450   VLDL 24 10/01/2015 0450   LDLCALC 33 10/01/2015 0450   HgbA1c:  Lab Results  Component Value Date   HGBA1C 5.6 10/01/2015   Urine Drug Screen:     Component Value Date/Time   LABOPIA NONE DETECTED 09/30/2015 1816   COCAINSCRNUR POSITIVE* 09/30/2015 1816   LABBENZ POSITIVE* 09/30/2015 1816   AMPHETMU NONE DETECTED 09/30/2015 1816   THCU NONE DETECTED 09/30/2015 1816   LABBARB NONE DETECTED 09/30/2015 1816      IMAGING  Ct Angio Head and neck W Or Wo Contrast 09/30/2015   1. No evidence of major intracranial arterial occlusion, aneurysm, or flow limiting proximal stenosis.  2. Intracranial atherosclerosis with mild right  ICA, mild left M2, and mild bilateral P2 narrowing.  3. Unchanged 3 cm right thalamic hemorrhage. Small volume intraventricular hemorrhage, slightly decreased from prior.  4. 50% proximal right ICA stenosis.  5. Mild proximal right vertebral artery stenosis.   Ct Head Wo Contrast 10/04/2015  Degenerating RIGHT thalamus hematoma with local mass effect and edema. Similar mild hydrocephalus, stable RIGHT frontal ventriculostomy catheter. Degenerating intraventricular blood products.    10/03/2015 1. No significant interval change in size of right thalamic parenchymal hemorrhage (estimated volume 6.7 cc), although localized edema has mildly increased. No significant mass effect. 2. Similar intraventricular extension with small volume intraventricular hemorrhage. 3. Right frontal approach ventricular catheter in place with tip at the foramen of Monro. Overall ventricular size is felt to be stable to slightly decreased relative to 09/29/2015. 4. No other new acute intracranial process.  09/29/2015   Acute intracranial hemorrhage with intraventricular extension and possible developing hydrocephalus Although no fractures identified in the cervical spine, the evaluation of the cervical spine is limited.   Ct Cervical Spine Wo Contrast 09/29/2015   Acute intracranial hemorrhage with intraventricular extension and possible developing hydrocephalus Although no fractures identified in the cervical  spine, the evaluation of the cervical spine is limited.   Dg Chest Port 1 View 09/30/2015   Appliances appear in satisfactory position. No central venous catheter is identified. Atelectasis in the lung bases.   Dg Chest Port 1 View 10/04/2015 1. Lines and tubes in stable position. 2. Low lung volumes with mild bibasilar atelectasis and/or infiltrates. 3. Mild cardiomegaly 10/03/2015 Stable. Subsegmental atelectasis at the left base. 10/02/2015 Stable support apparatus. No acute cardiopulmonary abnormality  seen. 10/01/2015 1. Support structures as described. No pneumothorax 2. Mild left lung base opacity, slightly increased, favor mild Atelectasis. 09/30/2015 Appliances appear in satisfactory position. No central venous catheter is identified. Atelectasis in the lung bases. 09/29/2015 Appliances in satisfactory position. Atelectasis in the left lung base.  TTE  - Left ventricle: The cavity size was mildly dilated. Wall  thickness was increased in a pattern of moderate LVH. Systolic function was normal. The estimated ejection fraction was in the range of 60% to 65%. Wall motion was normal; there were no regional wall motion abnormalities. Doppler parameters are consistent with both elevated ventricular end-diastolic filling pressure and elevated left atrial filling pressure. - Atrial septum: No defect or patent foramen ovale was identified.   PHYSICAL EXAM Temp:  [97.9 F (36.6 C)-100.4 F (38 C)] 99.3 F (37.4 C) (06/20 0800) Pulse Rate:  [58-81] 81 (06/20 0820) Resp:  [8-25] 15 (06/20 0820) BP: (102-174)/(49-77) 144/55 mmHg (06/20 0820) SpO2:  [100 %] 100 % (06/20 0820) FiO2 (%):  [40 %] 40 % (06/20 0820) Weight:  [44.6 kg (98 lb 5.2 oz)] 44.6 kg (98 lb 5.2 oz) (06/20 0600)  General - Well nourished, well developed, intubated.  Ophthalmologic - Fundi not visualized due to ET positioning.  Cardiovascular - Regular rate and rhythm.  Neuro - intubated on vent and propofol, eyes not open, not following commands. PERRL, eyes in the middle position, left eye downward disconjugation, doll's eye present, positive gag and corneal. Facial droop not able to evaluate due to ET tube. Right UE and LE spontaneous movement. On pain stimulation, strong against gravity on the right UE and LE, left UE 0/5 and LLE 3-/5. Babinski positive bilaterally, DTR 1+. Sensation, gait and coordination not tested.    ASSESSMENT/PLAN Ms. ALFHILD FEINER is a 60 y.o. female with history of hypertension, hyperlipidemia,  alcohol abuse, cocaine abuse, herpes simplex, coronary artery disease with previous MI and stents, and cardiomyopathy  presenting with altered mental status and a fall. She did not receive IV t-PA due to intracranial hemorrhage.  ICH:  Non-dominant right BG/thalamic/hypothalamic ICH with IVH s/p EVD placement likely secondary to HTN.  Resultant  Left hemiparesis, respiratory failure  MRI MRA not performed  CTA head and neck - stable ICH, right proximal ICA 50% stenosis  Repeat CT head this am stable   2D Echo EF 60-65%  LDL - 33  HgbA1c 5.6  UDS cocaine positive  VTE prophylaxis - SCDs and subq heparin Q12h Diet NPO time specified. Has tube feedings, no free water  aspirin 81 mg daily and clopidogrel 75 mg daily prior to admission, now on No antithrombotic secondary to hemorrhage.  Ongoing aggressive stroke risk factor management  Therapy recommendations: Pending  Disposition: Pending  Respiratory failure  Intubated on vent  CCM on board  Weaning this am  Wean off vent as able  Hypertensive emergency  Remains Off cleviprex, BP slightly elevated as propofol decreased  BP goal < 160  Back on home BP meds - on cozaar and HCTZ,  coreg  Increase cozaar to 100mg  and HCTZ to 25mg   Hyperlipidemia  Home meds: Lipitor 80 mg daily - not resumed in the hospital secondary to [place].  LDL 33, goal < 70  Resume statin at discharge  May lower lipitor dose to 40mg  on discharge  Cocaine abuse  UDS positive for cocaine  Hx of cocaine use  Other Stroke Risk Factors  Advanced age  Cigarette smoker - advised to stop smoking  ETOH use, advised to drink no more than 1-2 drinks per day.  Coronary artery disease s/p stent - on home DAPT with high dose lipitor  Cardiomyopathy - EF 20% in 01/2015 but this admission EF 60-65%  Other Active Problems  Anemia - hemoglobin 10.3; hematocrit 32  Hypokalemia - supplemented -> 3.7  Hospital day # [place] for Pager information 10/04/2015 9:08 AM   This patient is critically ill due to [place] with IVH s/p EVD and hypertension and intubation and at significant risk of neurological worsening, death form hematoma expansion, hydrocephalus, heart failure, cerebral edema and herniation. This patient's care requires constant monitoring of vital signs, hemodynamics, respiratory and cardiac monitoring, review of multiple databases, neurological assessment, discussion with family, other specialists and medical decision making of high complexity. I spent 35 minutes of neurocritical care time in the care of this patient.  Pt still intubated, on weaning trials today. BP remains on the high side, increase losartan and HCTZ. EVD drainage minimal and drainage level increased to 15, consider EVD removal by NSG sometime soon.   Rosalin Hawking, MD PhD Stroke Neurology 10/04/2015 2:10 PM   To contact Stroke Continuity provider, please refer to [email or web address] After hours, contact General Neurology

## 2015-10-04 NOTE — Progress Notes (Signed)
Drain flushed CSF collected for analysis

## 2015-10-04 NOTE — Progress Notes (Signed)
[facility] Progress Note Patient Name: Joanna Farmer DOB: 11-13-1955 MRN: [id number]   Date of Service  10/04/2015  HPI/Events of Note  CSF studies: Glucose = 73. Protein = 41, RBC = 27000 and WBC = 11. Gram Stain - no bacteria seen. Results c/w post SAH and not c/w CNS infection.  eICU Interventions  Continue present management.      Intervention Category Intermediate Interventions: Diagnostic test evaluation  Lysle Dingwall 10/04/2015, 7:49 PM

## 2015-10-04 NOTE — Progress Notes (Signed)
[facility] Progress Note Patient Name: Joanna Farmer DOB: December 08, 1955 MRN: [id number]   Date of Service  10/04/2015  HPI/Events of Note  Fever to 101.4 F. Ventricular Drain. Intubated and ventilated. No Antibiotics.   eICU Interventions  Will order: 1. Blood cultures X 2 now. 2. UA now. 3. Tracheal aspirate culture now.  4. Nurse to ask Neurosurgeon if he wants to culture CSF. 5. Vancomycin and Zosyn per pharmacy consultation. 6. Tylenol liquid 650 mg per tube Q 6 hours PRN Temp > 100.5 F.     Intervention Category Major Interventions: Infection - evaluation and management  Sommer,Steven Eugene 10/04/2015, 4:05 PM

## 2015-10-04 NOTE — Progress Notes (Signed)
Pharmacy Antibiotic Note  Joanna Farmer is a 60 y.o. female admitted on 09/29/2015 with fever of unknown source.  Pharmacy has been consulted for vancomycin and Zosyn dosing.  Patient completed Rocephin for UTI earlier in hospitalization, vented and spiking fevers- tmax 101.4.  Plan: Vancomycin 1g IV every 24 hours.  Goal trough 15-20 mcg/mL. Zosyn 3.375g IV q8h (4 hour infusion).  Height: 5\' 3"  (160 cm) Weight: 98 lb 5.2 oz (44.6 kg) IBW/kg (Calculated) : 52.4  Temp (24hrs), Avg:99 F (37.2 C), Min:97.5 F (36.4 C), Max:100.4 F (38 C)   Recent Labs Lab 09/29/15 1500  09/29/15 1511 09/30/15 0430 10/01/15 0450 10/02/15 0415 10/03/15 0344 10/04/15 0359  WBC 7.4  --   --  6.5 6.8 7.7 6.5 7.9  CREATININE 0.92  < >  --  0.85 0.69 0.74 0.67 0.64  LATICACIDVEN 1.3  --  1.41  --   --   --   --   --   < > = values in this interval not displayed.  Estimated Creatinine Clearance: 52.7 mL/min (by C-G formula based on Cr of 0.64).    No Known Allergies  Antimicrobials this admission: Rocephin 6/15>> 6/17 Vanc 6/20>> Zosyn 6/20>>  Dose adjustments this admission: n/a  Microbiology results: 6/20 BCx: 6/20 TA: 6/15 BCx: neg 6/16 urine: 30K citrobacter youngae 6/16 MRSA PCR: neg  Thank you for allowing pharmacy to be a part of this patient's care.  Evolette Pendell D. Wilmar Prabhakar, PharmD, BCPS Clinical Pharmacist Pager: [phone] 10/04/2015 4:14 PM

## 2015-10-04 NOTE — Progress Notes (Signed)
PULMONARY / CRITICAL CARE MEDICINE   Name: Joanna Farmer MRN: [id number] DOB: 1955/12/31    ADMISSION DATE:  09/29/2015 CONSULTATION DATE:  09/29/2015  REFERRING MD:  Ezequiel Essex, M.D. / [facility] EDP  CHIEF COMPLAINT:  Altered mental status  HISTORY OF PRESENT ILLNESS:   60 year old female past medical history as below, which is significant for hypertension, alcohol abuse, cocaine abuse, HSV, coronary artery disease status post stents to RCA and LAD in 2015, and ischemic cardiomyopathy with LV EF 35-40%. She presented to [facility] emergency department 6/15 with altered mental status. Reportedly the night before she suffered a fall during the night, and since that time has been sleeping all day. She was brought to the emergency department with this complaint. In emergency department she was unresponsive but reportedly breathing adequately. She had no spontaneous movement and left gaze deviation, but did have a positive gag reflex. CT scan of the head was obtained and demonstrated acute intracranial hemorrhage with intraventricular extension. Cervical spine was without radiographic evidence of injury. She was intubated for airway protection with anticipation of transfer to [facility]. PCCM accepting.   SUBJECTIVE:  No events overnight, opened eyes earlier per family.  Not following commands yet. Some movement in R side of body, non purposeful.   VITAL SIGNS: BP 144/55 mmHg  Pulse 81  Temp(Src) 99.3 F (37.4 C) (Axillary)  Resp 15  Ht 5\' 3"  (1.6 m)  Wt 98 lb 5.2 oz (44.6 kg)  BMI 17.42 kg/m2  SpO2 100%  HEMODYNAMICS:    VENTILATOR SETTINGS: Vent Mode:  [-] CPAP FiO2 (%):  [40 %] 40 % Set Rate:  [14 bmp] 14 bmp Vt Set:  [420 mL] 420 mL PEEP:  [5 cmH20] 5 cmH20 Pressure Support:  [5 cmH20] 5 cmH20 Plateau Pressure:  [10 cmH20-16 cmH20] 10 cmH20  INTAKE / OUTPUT: I/O last 3 completed shifts: In: 2509.7 [I.V.:1280.4; NG/GT:1229.3] Out: 1334 [Urine:1265;  Drains:69]  PHYSICAL EXAMINATION: General:  Sedated. Intubated with IVC drain. Not following commands Integument:  Warm & dry. No rash on exposed skin.  HEENT:  Drain in place. No scleral injection or icterus. Endotracheal tube in place.  Cardiovascular:  RRR, no M/R/G. Pulmonary:  Coarse bilaterally.   Abdomen: Soft. Normal bowel sounds. Nondistended.  Musculoskeletal:  Normal bulk. No joint deformity or effusion appreciated. Neurological:  Sedated, does not follow commands.  LABS:  BMET  Recent Labs Lab 10/02/15 0415 10/03/15 0344 10/04/15 0359  NA 141 141 141  K 3.7 3.4* 4.0  CL 104 104 104  CO2 27 26 29   BUN 13 23* 21*  CREATININE 0.74 0.67 0.64  GLUCOSE 116* 111* 111*   Electrolytes  Recent Labs Lab 10/02/15 0415 10/03/15 0344 10/04/15 0359  CALCIUM 9.6 9.6 9.8  MG 1.9 2.4 2.2  PHOS 4.3 5.6* 4.5    CBC  Recent Labs Lab 10/02/15 0415 10/03/15 0344 10/04/15 0359  WBC 7.7 6.5 7.9  HGB 10.7* 10.0* 9.7*  HCT 33.2* 31.1* 30.4*  PLT 165 184 203   Coag's  Recent Labs Lab 09/29/15 1500  INR 0.96   Sepsis Markers  Recent Labs Lab 09/29/15 1500 09/29/15 1511  LATICACIDVEN 1.3 1.41   ABG  Recent Labs Lab 09/30/15 0417 10/02/15 0350 10/03/15 0403  PHART 7.387 7.446 7.484*  PCO2ART 33.8* 39.2 38.7  PO2ART 186* 126* 139*   Liver Enzymes  Recent Labs Lab 09/29/15 1500  AST 47*  ALT 40  ALKPHOS 65  BILITOT 1.2  ALBUMIN 4.3  Cardiac Enzymes  Recent Labs Lab 09/29/15 1500  TROPONINI 0.04*   Glucose  Recent Labs Lab 10/03/15 1116 10/03/15 1542 10/03/15 1932 10/03/15 2328 10/04/15 0332 10/04/15 0750  GLUCAP 117* 101* 120* 120* 126* 131*   Imaging Ct Head Wo Contrast  10/04/2015  CLINICAL DATA:  Followup thalamus hemorrhage. History of hypertension, hyperlipidemia, cocaine abuse. EXAM: CT HEAD WITHOUT CONTRAST TECHNIQUE: Contiguous axial images were obtained from the base of the skull through the vertex without intravenous  contrast. COMPARISON:  CT HEAD October 03, 2015 FINDINGS: INTRACRANIAL CONTENTS: Evolving and 28 x 20 mm mesial RIGHT thalamus intraparenchymal hematoma was 30 x 27 mm. Surrounding low-density vasogenic edema. Mass effect on the third ventricle. Stable position of RIGHT frontal ventriculostomy catheter with distal tip at the level for a foramen of Monro. Small amount of presumed interstitial edema along the catheter tract is unchanged. Mild residual hydrocephalus with dependent blood products in the occipital horns. No acute large vascular territory infarct. No abnormal extra-axial fluid collections. Basal cisterns are patent. Moderate calcific atherosclerosis of the carotid siphons. ORBITS: The included ocular globes and orbital contents are normal. SINUSES: The mastoid aircells and included paranasal sinuses are well-aerated. SKULL/SOFT TISSUES: No skull fracture. No significant soft tissue swelling. RIGHT frontal burr hole. IMPRESSION: Degenerating RIGHT thalamus hematoma with local mass effect and edema. Similar mild hydrocephalus, stable RIGHT frontal ventriculostomy catheter. Degenerating intraventricular blood products. Electronically Signed   By: Elon Alas M.D.   On: 10/04/2015 03:05   Dg Chest Port 1 View  10/04/2015  CLINICAL DATA:  Respiratory failure. EXAM: PORTABLE CHEST 1 VIEW COMPARISON:  10/03/2015 . FINDINGS: Endotracheal tube and NG tube in stable position. Cardiomegaly with normal pulmonary vascularity. Low lung volumes with mild bibasilar atelectasis. No pleural effusion or pneumothorax . IMPRESSION: 1.  Lines and tubes in stable position. 2. Low lung volumes with mild bibasilar atelectasis and/or infiltrates. 3.  Mild cardiomegaly Electronically Signed   By: Marcello Moores  Register   On: 10/04/2015 07:42   STUDIES:  CT Head W/O 6/15: Acute intracranial hemorrhage with intraventricular extension and possible developing hydrocephalus Although no fractures identified in the cervical spine, the  evaluation of the cervical spine is limited. CT Neck 6/15: Limited evaluation of cervical spine due to motion. No fractures evident. Port CXR 6/15: Silhouetting left hemidiaphragm. Endotracheal tube tip at the level of the carina to nearly entering right mainstem bronchus. Orogastric tube passing below diaphragm. Port CXR 6/16>>ETT 4.5 cm above carina. Atelectasis in lung bases CT head 6/19 > no significant change since compared to prior scan. CT Head  10/04/2015 >> Degenerating RIGHT thalamus hematoma with local mass effect and edema. Similar mild hydrocephalus, stable RIGHT frontal ventriculostomy catheter. Degenerating intraventricular blood products.  MICROBIOLOGY: MRSA PCR 6/15 >> (-) Blood Ctx x2 6/15 >> (-)  ANTIBIOTICS: 6/15 Rocephin >>6/18 6/16 Acef (prophylactic abx) >> 6/18  SIGNIFICANT EVENTS: 6/15 - Admit to ICU on ventilator for ICH after transfer from [facility] ED  LINES/TUBES: OETT 7.5 6/15 >> Foley 6/15 >> OGT 6/15 >> PIV X3 IVC Drain 6/16 >>   DISCUSSION: 60 year old female past medical history of coronary artery disease, ischemic cardiomyopathy, and polysubstance abuse being admitted with right thalamic intracranial hemorrhage. She is intubated for airway protection and was transferred to [facility] for ICU admission. Will maintain on ventilator with tight blood pressure control with Cardene infusion. Neurology and neurosurgery consulted.  IVC drain placed early am 6/16.    ASSESSMENT / PLAN:  PULMONARY A: Acute Hypoxemic resp failure  2/2 Unable to Protect Airway - Secondary to [place]. CPAP trial 6/20 am P:   Begin PS trials when pt more alert. Try to wean off sedation.  Wean O2 for sats > 94% PCXR 6/21  CARDIOVASCULAR A:  Chronic Systolic CHF - EF 123456 w/ grade 1 diastolic dysfunction. RV normal. H/O Hypertension H/O CAD - S/P PCI in 2015 H/O Hyperlipidemia P:  Tight BP control with SBP goal < 138mmHg Continue clevidipine per NSGY Continue carvedilol,  HCTZ, losartan  RENAL A:   Metabolic Acidosis - resolved Hypokalemia - s/p repletion 6/19. P:   Monitor renal function and UOP Strict I&O Correct electrolytes as indicated KVO IVF  GASTROINTESTINAL A:   H/O GERD P:   TF per nutrition Pepcid IV QD  HEMATOLOGIC A:   No acute issues. P:  Follow CBC SCDs No chemical DVT ppx given ICH  INFECTIOUS A:   No acute evidence of infection. H/O HSV Infection P:   Follow WBC and fever curve Prophylactic Abx for Ventriculostomy > this has been dc'd Low thresh hold for ABX with fever/ Leukocytosis as high aspiration risk.  ENDOCRINE A:   No acute issues P:   CBG's Q 4 hours SSI Follow glucose on BMP  NEUROLOGIC A:   Right Thalamic Intracranial Hemorrhage s/p IVC 6/16 Cocaine Abuse - urine drug screen positive for cocaine on admission H/O EtOH Abuse H/O Depression Following commands on the right side only. P:  Sedation: Propofol gtt / Fentanyl PRN. RASS goal: 0. Neurology assistance appreciated. Neurosurgery assistance appreciated. Blood pressure control as above.  FAMILY  - Updates:  Family  at bedside 6/20, updated.  Magdalen Spatz, AGACNP-BC [place] [phone] 10/04/2015, 10:35 AM     ATTENDING NOTE / ATTESTATION NOTE :   I have discussed the case with the resident/APP  Eric Form.  I agree with the resident/APP's  history, physical examination, assessment, and plans.  I have edited the above note and modified it according to our agreed history, physical examination, assessment and plan.   Pt with ICH, related to cocaine use.  Awaiting to wake up. Unsure of neurologic recovery. Wean when she wakes up. Not on abx.   I have spent 32 minutes of critical care time with this patient today.  Family :No family at bedside.    Monica Becton, MD 10/04/2015, 12:27 PM [facility] Pager [phone] After 3 pm or if no answer, call  [phone]

## 2015-10-04 NOTE — Progress Notes (Signed)
CRITICAL VALUE ALERT  Critical value received: CSF WBC 11  Date of notification:  10/04/15  Time of notification:  1942  Critical value read back:Yes.    Nurse who received alert:  Governor Rooks RN  MD notified (1st page): S. Sommer MD  Time of first page: 1942  MD notified (2nd page):  Time of second page:  Responding MD: S. Sommer MD  Time MD responded: 1942  No new orders given at this time. Will continue monitor. Lianne Bushy RN BSN

## 2015-10-04 NOTE — Progress Notes (Signed)
No acute events Drain patent Neuro exam unchanged Increase popoff to 15 cm H2O Transduce ICP

## 2015-10-05 ENCOUNTER — Inpatient Hospital Stay (HOSPITAL_COMMUNITY): Payer: Medicaid Other

## 2015-10-05 DIAGNOSIS — G935 Compression of brain: Secondary | ICD-10-CM

## 2015-10-05 DIAGNOSIS — J189 Pneumonia, unspecified organism: Secondary | ICD-10-CM | POA: Insufficient documentation

## 2015-10-05 LAB — BASIC METABOLIC PANEL
ANION GAP: 7 (ref 5–15)
BUN: 19 mg/dL (ref 6–20)
CALCIUM: 9.3 mg/dL (ref 8.9–10.3)
CHLORIDE: 102 mmol/L (ref 101–111)
CO2: 30 mmol/L (ref 22–32)
CREATININE: 0.76 mg/dL (ref 0.44–1.00)
GFR calc non Af Amer: >60 mL/min (ref >60)
GLUCOSE: 125 mg/dL — AB (ref 65–99)
Potassium: 3.3 mmol/L — ABNORMAL LOW (ref 3.5–5.1)
Sodium: 139 mmol/L (ref 135–145)

## 2015-10-05 LAB — CBC
HCT: 27.2 % — ABNORMAL LOW (ref 36.0–46.0)
HEMOGLOBIN: 8.7 g/dL — AB (ref 12.0–15.0)
MCH: 31 pg (ref 26.0–34.0)
MCHC: 32 g/dL (ref 30.0–36.0)
MCV: 96.8 fL (ref 78.0–100.0)
Platelets: 215 10*3/uL (ref 150–400)
RBC: 2.81 MIL/uL — AB (ref 3.87–5.11)
RDW: 13.5 % (ref 11.5–15.5)
WBC: 7 10*3/uL (ref 4.0–10.5)

## 2015-10-05 LAB — GLUCOSE, CAPILLARY
GLUCOSE-CAPILLARY: 107 mg/dL — AB (ref 65–99)
GLUCOSE-CAPILLARY: 124 mg/dL — AB (ref 65–99)
GLUCOSE-CAPILLARY: 127 mg/dL — AB (ref 65–99)
GLUCOSE-CAPILLARY: 129 mg/dL — AB (ref 65–99)
GLUCOSE-CAPILLARY: 136 mg/dL — AB (ref 65–99)
Glucose-Capillary: 143 mg/dL — ABNORMAL HIGH (ref 65–99)
Glucose-Capillary: 126 mg/dL — ABNORMAL HIGH (ref 65–99)

## 2015-10-05 LAB — PROCALCITONIN: Procalcitonin: <0.1 ng/mL

## 2015-10-05 LAB — TRIGLYCERIDES: Triglycerides: 79 mg/dL (ref <150)

## 2015-10-05 LAB — PATHOLOGIST SMEAR REVIEW

## 2015-10-05 MED ORDER — POTASSIUM CHLORIDE 20 MEQ/15ML (10%) PO SOLN
20.0000 meq | ORAL | Status: AC
  Administered 2015-10-05 (×2): 20 meq
  Filled 2015-10-05 (×2): qty 15

## 2015-10-05 NOTE — Progress Notes (Signed)
PULMONARY / CRITICAL CARE MEDICINE   Name: Joanna Farmer MRN: [id number] DOB: [birth year]    ADMISSION DATE:  09/29/2015 CONSULTATION DATE:  09/29/2015  REFERRING MD:  Ezequiel Essex, M.D. / [facility]  CHIEF COMPLAINT:  Altered mental status  HISTORY OF PRESENT ILLNESS:   60 year old female past medical history as below, which is significant for hypertension, alcohol abuse, cocaine abuse, HSV, coronary artery disease status post stents to RCA and LAD in 2015, and ischemic cardiomyopathy with LV EF 35-40%. She presented to [facility] emergency department 6/15 with altered mental status. Reportedly the night before she suffered a fall during the night, and since that time has been sleeping all day. She was brought to the emergency department with this complaint. In emergency department she was unresponsive but reportedly breathing adequately. She had no spontaneous movement and left gaze deviation, but did have a positive gag reflex. CT scan of the head was obtained and demonstrated acute intracranial hemorrhage with intraventricular extension. Cervical spine was without radiographic evidence of injury. She was intubated for airway protection with anticipation of transfer to [facility]. PCCM accepting.   SUBJECTIVE:   Fevers overnight. Pancultured and started abx.   VITAL SIGNS: BP 127/54 mmHg  Pulse 89  Temp(Src) 99 F (37.2 C) (Core (Comment))  Resp 19  Ht 5\' 3"  (1.6 m)  Wt 46.9 kg (103 lb 6.3 oz)  BMI 18.32 kg/m2  SpO2 100%  HEMODYNAMICS:    VENTILATOR SETTINGS: Vent Mode:  [-] CPAP FiO2 (%):  [40 %] 40 % Set Rate:  [14 bmp] 14 bmp Vt Set:  [420 mL] 420 mL PEEP:  [5 cmH20] 5 cmH20 Pressure Support:  [5 cmH20-8 cmH20] 5 cmH20 Plateau Pressure:  [12 cmH20-16 cmH20] 12 cmH20  INTAKE / OUTPUT: I/O last 3 completed shifts: In: 3331 [I.V.:1581; NG/GT:1400; IV Piggyback:350] Out: 2236 [Urine:2100; Drains:136]  PHYSICAL EXAMINATION: General:  Sedated. Intubated with IVC  drain. Not following commands Integument:  Warm & dry. No rash on exposed skin.  HEENT:  Drain in place. No scleral injection or icterus. Endotracheal tube in place.  Cardiovascular:  RRR, no M/R/G. Pulmonary:  Coarse bilaterally.   Abdomen: Soft. Normal bowel sounds. Nondistended.  Musculoskeletal:  Normal bulk. No joint deformity or effusion appreciated. Neurological: Off sedation > she gets "agitated" and is not fully awake. Some movement in R side.   LABS:  BMET  Recent Labs Lab 10/03/15 0344 10/04/15 0359 10/05/15 0320  NA 141 141 139  K 3.4* 4.0 3.3*  CL 104 104 102  CO2 26 29 30   BUN 23* 21* 19  CREATININE 0.67 0.64 0.76  GLUCOSE 111* 111* 125*   Electrolytes  Recent Labs Lab 10/02/15 0415 10/03/15 0344 10/04/15 0359 10/05/15 0320  CALCIUM 9.6 9.6 9.8 9.3  MG 1.9 2.4 2.2  --   PHOS 4.3 5.6* 4.5  --     CBC  Recent Labs Lab 10/03/15 0344 10/04/15 0359 10/05/15 0320  WBC 6.5 7.9 7.0  HGB 10.0* 9.7* 8.7*  HCT 31.1* 30.4* 27.2*  PLT 184 203 215   Coag's  Recent Labs Lab 09/29/15 1500  INR 0.96   Sepsis Markers  Recent Labs Lab 09/29/15 1500 09/29/15 1511  LATICACIDVEN 1.3 1.41   ABG  Recent Labs Lab 09/30/15 0417 10/02/15 0350 10/03/15 0403  PHART 7.387 7.446 7.484*  PCO2ART 33.8* 39.2 38.7  PO2ART 186* 126* 139*   Liver Enzymes  Recent Labs Lab 09/29/15 1500  AST 47*  ALT 40  ALKPHOS  65  BILITOT 1.2  ALBUMIN 4.3   Cardiac Enzymes  Recent Labs Lab 09/29/15 1500  TROPONINI 0.04*   Glucose  Recent Labs Lab 10/04/15 1115 10/04/15 1529 10/04/15 1940 10/04/15 2332 10/05/15 0337 10/05/15 0756  GLUCAP 132* 116* 130* 124* 107* 129*   Imaging Dg Chest Port 1 View  10/05/2015  CLINICAL DATA:  Acute respiratory failure EXAM: PORTABLE CHEST 1 VIEW COMPARISON:  10/04/2015 FINDINGS: Endotracheal tube tip 4.1 cm above the carina. Nasogastric tube tracks into the stomach. Atherosclerotic calcification of the aortic arch.  Indistinct retrocardiac density in the left lower lobe. This appears essentially stable. The lungs appear otherwise clear. IMPRESSION: 1. Indistinct linear opacities in the left lower lobe could reflect atelectasis or pneumonia. 2. Tubes and lines remain satisfactorily position. 3. Atherosclerotic calcification of the aortic arch. Electronically Signed   By: Van Clines M.D.   On: 10/05/2015 07:59   STUDIES:  CT Head W/O 6/15: Acute intracranial hemorrhage with intraventricular extension and possible developing hydrocephalus Although no fractures identified in the cervical spine, the evaluation of the cervical spine is limited. CT Neck 6/15: Limited evaluation of cervical spine due to motion. No fractures evident. Port CXR 6/15: Silhouetting left hemidiaphragm. Endotracheal tube tip at the level of the carina to nearly entering right mainstem bronchus. Orogastric tube passing below diaphragm. Port CXR 6/16>>ETT 4.5 cm above carina. Atelectasis in lung bases CT head 6/19 > no significant change since compared to prior scan. CT Head  10/04/2015 >> Degenerating RIGHT thalamus hematoma with local mass effect and edema. Similar mild hydrocephalus, stable RIGHT frontal ventriculostomy catheter. Degenerating intraventricular blood products.  MICROBIOLOGY: MRSA PCR 6/15 >> (-) Blood Ctx x2 6/15 >> (-) Blood 6/20 > Sputum 6/20 > Urine 6/20 >   ANTIBIOTICS: 6/15 Rocephin >>6/18 6/16 Acef (prophylactic abx) >> 6/18 Zosyn 6/20 > Vanc 6/20 >   SIGNIFICANT EVENTS: 6/15 - Admit to ICU on ventilator for ICH after transfer from [facility] ED  LINES/TUBES: OETT 7.5 6/15 >> Foley 6/15 >> OGT 6/15 >> PIV X3 IVC Drain 6/16 >>   DISCUSSION: 60 year old female past medical history of coronary artery disease, ischemic cardiomyopathy, and polysubstance abuse being admitted with right thalamic intracranial hemorrhage. She is intubated for airway protection and was transferred to [facility] for ICU  admission. Will maintain on ventilator with tight blood pressure control with Cardene infusion. Neurology and neurosurgery consulted.  IVC drain placed early am 6/16.    ASSESSMENT / PLAN:  PULMONARY A: Acute Hypoxemic resp failure 2/2 Unable to Protect Airway - Secondary to [place]. CXR 6/21 Possible LLL infiltrate, possible HCAP.  P:   Daily PST > doing fair but not fully awake 2/2 neuro issues.  Wean O2 for sats > 94% F/u culture. On broadspectrum abx. Check PCT.   CARDIOVASCULAR A:  Chronic Systolic CHF - EF 123456 w/ grade 1 diastolic dysfunction. RV normal. H/O Hypertension H/O CAD - S/P PCI in 2015 H/O Hyperlipidemia P:  Tight BP control with SBP goal < 127mmHg Continue clevidipine per NSGY Continue carvedilol, HCTZ, losartan May need diuresis given CHF  RENAL A:   Metabolic Acidosis - resolved Hypokalemia  P:   Monitor renal function and UOP Strict I&O Correct electrolytes as indicated   GASTROINTESTINAL A:   H/O GERD P:   TF per nutrition Pepcid IV QD  HEMATOLOGIC A:   No acute issues. P:  Follow CBC SCDs No chemical DVT ppx given ICH  INFECTIOUS A:   Persistent fever. CXR with possible LLL  infiltrate H/O HSV Infection P:   F/u panculture. On zosyn and vanc. Check PCT. Deescalate accordingly.  Follow WBC and fever curve  ENDOCRINE A:   No acute issues P:   CBG's Q 4 hours SSI Follow glucose on BMP  NEUROLOGIC A:   Right Thalamic Intracranial Hemorrhage s/p IVC 6/16 Cocaine Abuse - urine drug screen positive for cocaine on admission H/O EtOH Abuse H/O Depression Following commands on the right side only. P:  Sedation: Propofol gtt / Fentanyl PRN. RASS goal: 0. Neurology assistance appreciated. Neurosurgery assistance appreciated. Blood pressure control as above.  FAMILY  - Updates:  No family at bedside.   Critical care time with this patient today : 35 mins.    Monica Becton, MD 10/05/2015, 10:11 AM [facility] Pager [phone] After 3 pm or if no answer, call [phone]

## 2015-10-05 NOTE — Progress Notes (Signed)
[facility] ADULT ICU REPLACEMENT PROTOCOL FOR AM LAB REPLACEMENT ONLY  The patient does apply for the [facility] Adult ICU Electrolyte Replacment Protocol based on the criteria listed below:   1. Is GFR >/= 40 ml/min? Yes.    Patient's GFR today is >60 2. Is urine output >/= 0.5 ml/kg/hr for the last 6 hours? Yes.   Patient's UOP is 2.01 ml/kg/hr 3. Is BUN < 60 mg/dL? Yes.    Patient's BUN today is 19 4. Abnormal electrolyte(s): K3.3 5. Ordered repletion with: per protocol 6. If a panic level lab has been reported, has the CCM MD in charge been notified? Yes.  .   Physician:  Governor Rooks, MD  [facility] 10/05/2015 4:50 AM

## 2015-10-05 NOTE — Progress Notes (Signed)
Pt ICP greater than 25 for nearly 10 minutes, I gave it a little extra time since we had just repositioned and laid the pt flat for a clean up. Notified Dr. Cyndy Freeze, he gave instructions to unclamp the IVC drain and set the drain at -20cm of water.

## 2015-10-05 NOTE — Progress Notes (Signed)
EVD clogged again overnight Flushed this morning Pressure low Neuro exam unchanged Clamp drain and scan in AM CSF results reassuring, no sign of infection. Elevated WBC consistent with RBC count.

## 2015-10-05 NOTE — Progress Notes (Signed)
STROKE TEAM PROGRESS NOTE   SUBJECTIVE (INTERVAL HISTORY) Boyfriend at bedside. EVD drain clogged overnight. Pt neuro stable. EVD clamped and repeat CT in am. BP stable. Currently tolerating weaning trials. But not sure if able to wean off vent.    OBJECTIVE Temp:  [98.1 F (36.7 C)-100.6 F (38.1 C)] 100.6 F (38.1 C) (06/21 1600) Pulse Rate:  [56-99] 71 (06/21 1600) Cardiac Rhythm:  [-] Normal sinus rhythm (06/21 1600) Resp:  [12-23] 16 (06/21 1600) BP: (114-186)/(47-76) 120/54 mmHg (06/21 1600) SpO2:  [100 %] 100 % (06/21 1600) FiO2 (%):  [40 %] 40 % (06/21 1600) Weight:  [103 lb 6.3 oz (46.9 kg)] 103 lb 6.3 oz (46.9 kg) (06/21 0444)  CBC:  Recent Labs Lab 09/29/15 1500  10/04/15 0359 10/05/15 0320  WBC 7.4  < > 7.9 7.0  NEUTROABS 6.1  --   --   --   HGB 13.5  < > 9.7* 8.7*  HCT 39.1  < > 30.4* 27.2*  MCV 96.5  < > 99.0 96.8  PLT 211  < > 203 215  < > = values in this interval not displayed.  Basic Metabolic Panel:   Recent Labs Lab 10/03/15 0344 10/04/15 0359 10/05/15 0320  NA 141 141 139  K 3.4* 4.0 3.3*  CL 104 104 102  CO2 26 29 30   GLUCOSE 111* 111* 125*  BUN 23* 21* 19  CREATININE 0.67 0.64 0.76  CALCIUM 9.6 9.8 9.3  MG 2.4 2.2  --   PHOS 5.6* 4.5  --     Lipid Panel:     Component Value Date/Time   CHOL 134 10/01/2015 0450   TRIG 79 10/05/2015 0320   HDL 77 10/01/2015 0450   CHOLHDL 1.7 10/01/2015 0450   VLDL 24 10/01/2015 0450   LDLCALC 33 10/01/2015 0450   HgbA1c:  Lab Results  Component Value Date   HGBA1C 5.6 10/01/2015   Urine Drug Screen:     Component Value Date/Time   LABOPIA NONE DETECTED 09/30/2015 1816   COCAINSCRNUR POSITIVE* 09/30/2015 1816   LABBENZ POSITIVE* 09/30/2015 1816   AMPHETMU NONE DETECTED 09/30/2015 1816   THCU NONE DETECTED 09/30/2015 1816   LABBARB NONE DETECTED 09/30/2015 1816      IMAGING I have personally reviewed the radiological images below and agree with the radiology interpretations.  Ct  Angio Head and neck W Or Wo Contrast 09/30/2015   1. No evidence of major intracranial arterial occlusion, aneurysm, or flow limiting proximal stenosis.  2. Intracranial atherosclerosis with mild right ICA, mild left M2, and mild bilateral P2 narrowing.  3. Unchanged 3 cm right thalamic hemorrhage. Small volume intraventricular hemorrhage, slightly decreased from prior.  4. 50% proximal right ICA stenosis.  5. Mild proximal right vertebral artery stenosis.   Ct Head Wo Contrast 10/04/2015  Degenerating RIGHT thalamus hematoma with local mass effect and edema. Similar mild hydrocephalus, stable RIGHT frontal ventriculostomy catheter. Degenerating intraventricular blood products.    10/03/2015 1. No significant interval change in size of right thalamic parenchymal hemorrhage (estimated volume 6.7 cc), although localized edema has mildly increased. No significant mass effect. 2. Similar intraventricular extension with small volume intraventricular hemorrhage. 3. Right frontal approach ventricular catheter in place with tip at the foramen of [place]. Overall ventricular size is felt to be stable to slightly decreased relative to 09/29/2015. 4. No other new acute intracranial process.  09/29/2015   Acute intracranial hemorrhage with intraventricular extension and possible developing hydrocephalus Although no fractures identified in the  cervical spine, the evaluation of the cervical spine is limited.   TTE  - Left ventricle: The cavity size was mildly dilated. Wall  thickness was increased in a pattern of moderate LVH. Systolic function was normal. The estimated ejection fraction was in the range of 60% to 65%. Wall motion was normal; there were no regional wall motion abnormalities. Doppler parameters are consistent with both elevated ventricular end-diastolic filling pressure and elevated left atrial filling pressure. - Atrial septum: No defect or patent foramen ovale was identified.   PHYSICAL  EXAM Temp:  [98.1 F (36.7 C)-100.6 F (38.1 C)] 100.6 F (38.1 C) (06/21 1600) Pulse Rate:  [56-99] 71 (06/21 1600) Resp:  [12-23] 16 (06/21 1600) BP: (114-186)/(47-76) 120/54 mmHg (06/21 1600) SpO2:  [100 %] 100 % (06/21 1600) FiO2 (%):  [40 %] 40 % (06/21 1600) Weight:  [103 lb 6.3 oz (46.9 kg)] 103 lb 6.3 oz (46.9 kg) (06/21 0444)  General - Well nourished, well developed, intubated.  Ophthalmologic - Fundi not visualized due to ET positioning.  Cardiovascular - Regular rate and rhythm.  Neuro - intubated on vent and propofol, eyes not open, not following commands. PERRL, eyes in the middle position, left eye downward disconjugation, doll's eye present, positive gag and corneal. Facial droop not able to evaluate due to ET tube. Right UE and LE spontaneous movement. On pain stimulation, strong against gravity on the right UE and LE, left UE 0/5 and LLE 3-/5. Babinski positive bilaterally, DTR 1+. Sensation, gait and coordination not tested.    ASSESSMENT/PLAN Joanna Farmer is a 60 y.o. female with history of hypertension, hyperlipidemia, alcohol abuse, cocaine abuse, herpes simplex, coronary artery disease with previous MI and stents, and cardiomyopathy  presenting with altered mental status and a fall. She did not receive IV t-PA due to intracranial hemorrhage.  ICH:  Non-dominant right BG/thalamic/hypothalamic ICH with IVH s/p EVD placement likely secondary to HTN.  Resultant  Left hemiparesis, respiratory failure  MRI MRA not performed  CTA head and neck - stable ICH, right proximal ICA 50% stenosis  Repeat CT head stable  EVD clamped, repeat CT in am   2D Echo EF 60-65%  LDL - 33  HgbA1c 5.6  UDS cocaine positive  VTE prophylaxis - SCDs and subq heparin Q12h Diet NPO time specified. Has tube feedings, no free water  aspirin 81 mg daily and clopidogrel 75 mg daily prior to admission, now on No antithrombotic secondary to hemorrhage.  Ongoing aggressive  stroke risk factor management  Therapy recommendations: Pending  Disposition: Pending  Respiratory failure  Intubated on vent  CCM on board  Weaning this am  Wean off vent as able  Fever  CSF WBC 11 and RBC 27000 - no evidence of ventriculitis  UA negative so far   BCx pending  CXR stable so far  On vanco and zosyn  Hypertensive emergency  Remains Off cleviprex, BP stable  BP goal < 160  Back on home BP meds - on cozaar and HCTZ, coreg  Increase cozaar to 100mg  and HCTZ to 25mg   Hyperlipidemia  Home meds: Lipitor 80 mg daily - not resumed in the hospital secondary to [place].  LDL 33, goal < 70  Resume statin at discharge  May lower lipitor dose to 40mg  on discharge  Cocaine abuse  UDS positive for cocaine  Hx of cocaine use  Other Stroke Risk Factors  Advanced age  Cigarette smoker - advised to stop smoking  ETOH use, advised to  drink no more than 1-2 drinks per day.  Coronary artery disease s/p stent - on home DAPT with high dose lipitor  Cardiomyopathy - EF 20% in 01/2015 but this admission EF 60-65%  Other Active Problems  Anemia - hemoglobin 10.3; hematocrit 32  Hypokalemia - supplemented -> 3.7  Hospital day # 6  This patient is critically ill due to [place] with IVH s/p EVD and hypertension and intubation and at significant risk of neurological worsening, death form hematoma expansion, hydrocephalus, heart failure, cerebral edema and herniation. This patient's care requires constant monitoring of vital signs, hemodynamics, respiratory and cardiac monitoring, review of multiple databases, neurological assessment, discussion with family, other specialists and medical decision making of high complexity. I spent 35 minutes of neurocritical care time in the care of this patient.   Rosalin Hawking, MD PhD Stroke Neurology 10/05/2015 5:47 PM   To contact Stroke Continuity provider, please refer to [email or web address] After hours, contact General  Neurology

## 2015-10-06 ENCOUNTER — Inpatient Hospital Stay (HOSPITAL_COMMUNITY): Payer: Medicaid Other

## 2015-10-06 LAB — BASIC METABOLIC PANEL
ANION GAP: 8 (ref 5–15)
BUN: 16 mg/dL (ref 6–20)
CALCIUM: 9.2 mg/dL (ref 8.9–10.3)
CO2: 28 mmol/L (ref 22–32)
CREATININE: 0.75 mg/dL (ref 0.44–1.00)
Chloride: 103 mmol/L (ref 101–111)
Glucose, Bld: 121 mg/dL — ABNORMAL HIGH (ref 65–99)
Potassium: 3.6 mmol/L (ref 3.5–5.1)
SODIUM: 139 mmol/L (ref 135–145)

## 2015-10-06 LAB — CBC
HEMATOCRIT: 28.3 % — AB (ref 36.0–46.0)
Hemoglobin: 8.9 g/dL — ABNORMAL LOW (ref 12.0–15.0)
MCH: 31.2 pg (ref 26.0–34.0)
MCHC: 31.4 g/dL (ref 30.0–36.0)
MCV: 99.3 fL (ref 78.0–100.0)
PLATELETS: 226 10*3/uL (ref 150–400)
RBC: 2.85 MIL/uL — ABNORMAL LOW (ref 3.87–5.11)
RDW: 13.5 % (ref 11.5–15.5)
WBC: 6.6 10*3/uL (ref 4.0–10.5)

## 2015-10-06 LAB — PROCALCITONIN: PROCALCITONIN: 0.1 ng/mL

## 2015-10-06 LAB — GLUCOSE, CAPILLARY
GLUCOSE-CAPILLARY: 116 mg/dL — AB (ref 65–99)
Glucose-Capillary: 123 mg/dL — ABNORMAL HIGH (ref 65–99)
Glucose-Capillary: 132 mg/dL — ABNORMAL HIGH (ref 65–99)
Glucose-Capillary: 110 mg/dL — ABNORMAL HIGH (ref 65–99)
Glucose-Capillary: 117 mg/dL — ABNORMAL HIGH (ref 65–99)

## 2015-10-06 LAB — TRIGLYCERIDES: Triglycerides: 72 mg/dL (ref <150)

## 2015-10-06 MED ORDER — LOSARTAN POTASSIUM 50 MG PO TABS
50.0000 mg | ORAL_TABLET | Freq: Two times a day (BID) | ORAL | Status: DC
  Administered 2015-10-06 – 2015-10-10 (×8): 50 mg via ORAL
  Filled 2015-10-06 (×8): qty 1

## 2015-10-06 MED ORDER — VITAL AF 1.2 CAL PO LIQD
1000.0000 mL | ORAL | Status: DC
  Administered 2015-10-06 – 2015-10-23 (×18): 1000 mL

## 2015-10-06 MED ORDER — ALTEPLASE 2 MG IJ SOLR
2.0000 mg | Freq: Once | INTRAMUSCULAR | Status: AC
  Administered 2015-10-06: 2 mg
  Filled 2015-10-06: qty 2

## 2015-10-06 NOTE — Progress Notes (Signed)
No acute events EVD flushed again today Pressures low Neuro unchanged Continue drainage for now

## 2015-10-06 NOTE — Progress Notes (Deleted)
RT disconnected patient from vent to be manually ventilated to the OR by the CRNA.

## 2015-10-06 NOTE — Progress Notes (Signed)
STROKE TEAM PROGRESS NOTE   SUBJECTIVE (INTERVAL HISTORY) No family at bedside. EVD drain clogged overnight again and ICP increased to 30s. Pt neuro stable. EVD cleared with tPA by NSG and repeat CT stable and no hydrocephalus. BP on the high side, will spread BP meds schedule. Currently tolerating weaning trials. But not able to wean off vent.    OBJECTIVE Temp:  [98.8 F (37.1 C)-101.3 F (38.5 C)] 100.4 F (38 C) (06/22 1700) Pulse Rate:  [56-110] 73 (06/22 1700) Cardiac Rhythm:  [-] Normal sinus rhythm (06/22 1200) Resp:  [0-23] 14 (06/22 1700) BP: (102-177)/(46-128) 138/58 mmHg (06/22 1700) SpO2:  [100 %] 100 % (06/22 1700) FiO2 (%):  [30 %-40 %] 30 % (06/22 1700) Weight:  [102 lb 8.2 oz (46.5 kg)] 102 lb 8.2 oz (46.5 kg) (06/22 0500)  CBC:   Recent Labs Lab 10/05/15 0320 10/06/15 0917  WBC 7.0 6.6  HGB 8.7* 8.9*  HCT 27.2* 28.3*  MCV 96.8 99.3  PLT 215 [id number]    Basic Metabolic Panel:   Recent Labs Lab 10/03/15 0344 10/04/15 0359 10/05/15 0320 10/06/15 0917  NA 141 141 139 139  K 3.4* 4.0 3.3* 3.6  CL 104 104 102 103  CO2 26 29 30 28   GLUCOSE 111* 111* 125* 121*  BUN 23* 21* 19 16  CREATININE 0.67 0.64 0.76 0.75  CALCIUM 9.6 9.8 9.3 9.2  MG 2.4 2.2  --   --   PHOS 5.6* 4.5  --   --     Lipid Panel:     Component Value Date/Time   CHOL 134 10/01/2015 0450   TRIG 72 10/06/2015 0426   HDL 77 10/01/2015 0450   CHOLHDL 1.7 10/01/2015 0450   VLDL 24 10/01/2015 0450   LDLCALC 33 10/01/2015 0450   HgbA1c:  Lab Results  Component Value Date   HGBA1C 5.6 10/01/2015   Urine Drug Screen:     Component Value Date/Time   LABOPIA NONE DETECTED 09/30/2015 1816   COCAINSCRNUR POSITIVE* 09/30/2015 1816   LABBENZ POSITIVE* 09/30/2015 1816   AMPHETMU NONE DETECTED 09/30/2015 1816   THCU NONE DETECTED 09/30/2015 1816   LABBARB NONE DETECTED 09/30/2015 1816      IMAGING I have personally reviewed the radiological images below and agree with the radiology  interpretations.  Ct Angio Head and neck W Or Wo Contrast 09/30/2015   1. No evidence of major intracranial arterial occlusion, aneurysm, or flow limiting proximal stenosis.  2. Intracranial atherosclerosis with mild right ICA, mild left M2, and mild bilateral P2 narrowing.  3. Unchanged 3 cm right thalamic hemorrhage. Small volume intraventricular hemorrhage, slightly decreased from prior.  4. 50% proximal right ICA stenosis.  5. Mild proximal right vertebral artery stenosis.   Ct Head Wo Contrast 10/06/2015  IMPRESSION: 1. Continued interval evolution of right thalamic parenchymal hematoma with similar localized mass effect and edema. 2. Stable position of right frontal approach ventricular catheter. Stable ventricular size with small volume degenerating intraventricular hemorrhage.   10/04/2015  Degenerating RIGHT thalamus hematoma with local mass effect and edema. Similar mild hydrocephalus, stable RIGHT frontal ventriculostomy catheter. Degenerating intraventricular blood products.    10/03/2015 1. No significant interval change in size of right thalamic parenchymal hemorrhage (estimated volume 6.7 cc), although localized edema has mildly increased. No significant mass effect. 2. Similar intraventricular extension with small volume intraventricular hemorrhage. 3. Right frontal approach ventricular catheter in place with tip at the foramen of Monro. Overall ventricular size is felt to be stable  to slightly decreased relative to 09/29/2015. 4. No other new acute intracranial process.  09/29/2015   Acute intracranial hemorrhage with intraventricular extension and possible developing hydrocephalus Although no fractures identified in the cervical spine, the evaluation of the cervical spine is limited.   TTE  - Left ventricle: The cavity size was mildly dilated. Wall  thickness was increased in a pattern of moderate LVH. Systolic function was normal. The estimated ejection fraction was in the  range of 60% to 65%. Wall motion was normal; there were no regional wall motion abnormalities. Doppler parameters are consistent with both elevated ventricular end-diastolic filling pressure and elevated left atrial filling pressure. - Atrial septum: No defect or patent foramen ovale was identified.   PHYSICAL EXAM Temp:  [98.8 F (37.1 C)-101.3 F (38.5 C)] 100.4 F (38 C) (06/22 1700) Pulse Rate:  [56-110] 73 (06/22 1700) Resp:  [0-23] 14 (06/22 1700) BP: (102-177)/(46-128) 138/58 mmHg (06/22 1700) SpO2:  [100 %] 100 % (06/22 1700) FiO2 (%):  [30 %-40 %] 30 % (06/22 1700) Weight:  [102 lb 8.2 oz (46.5 kg)] 102 lb 8.2 oz (46.5 kg) (06/22 0500)  General - Well nourished, well developed, intubated.  Ophthalmologic - Fundi not visualized due to ET positioning.  Cardiovascular - Regular rate and rhythm.  Neuro - intubated on vent and propofol, eyes not open, not following commands. PERRL, eyes in the middle position, left eye downward disconjugation, doll's eye present, positive gag and corneal. Facial droop not able to evaluate due to ET tube. Right UE and LE spontaneous movement. On pain stimulation, strong against gravity on the right UE and LE, left UE 0/5 and LLE 3-/5. Babinski positive bilaterally, DTR 1+. Sensation, gait and coordination not tested.    ASSESSMENT/PLAN Ms. JAMESHIA KORST is a 60 y.o. female with history of hypertension, hyperlipidemia, alcohol abuse, cocaine abuse, herpes simplex, coronary artery disease with previous MI and stents, and cardiomyopathy  presenting with altered mental status and a fall. She did not receive IV t-PA due to intracranial hemorrhage.  ICH:  Non-dominant right BG/thalamic/hypothalamic ICH with IVH s/p EVD placement likely secondary to HTN and cocaine use.  Resultant  Left hemiparesis, respiratory failure  CTA head and neck - stable ICH, right proximal ICA 50% stenosis  Repeat CT head stable, no hydrocephalus  EVD in place with level  at 20cmH2O   2D Echo EF 60-65%  LDL - 33  HgbA1c 5.6  UDS cocaine positive  VTE prophylaxis - SCDs and subq heparin Q12h Diet NPO time specified. Has tube feedings, no free water  aspirin 81 mg daily and clopidogrel 75 mg daily prior to admission, now on No antithrombotic secondary to hemorrhage.   Ongoing aggressive stroke risk factor management  Therapy recommendations: Pending  Disposition: Pending  Respiratory failure  Intubated on vent  CCM on board  Weaning this am  Wean off vent as able  Fever  CSF WBC 11 and RBC 27000 - no evidence of ventriculitis  UA negative so far   Urine culture positive for Citrobacter - sensitive to zosyn  BCx NGTD  CXR stable so far  On zosyn  Hypertensive emergency  Remains Off cleviprex, BP high at night shift  BP goal < 160  Back on home BP meds - on cozaar and HCTZ, coreg  Spread cozaar to 50 mg bid and HCTZ to 12.5mg  bid  Hyperlipidemia  Home meds: Lipitor 80 mg daily - not resumed in the hospital secondary to [place].  LDL 33, goal <  70  Resume statin at discharge  May lower lipitor dose to 40mg  on discharge  Cocaine abuse  UDS positive for cocaine  Hx of cocaine use  Other Stroke Risk Factors  Advanced age  Cigarette smoker - advised to stop smoking  ETOH use, advised to drink no more than 1-2 drinks per day.  Coronary artery disease s/p stent - at home DAPT with high dose lipitor  Cardiomyopathy - EF 20% in 01/2015 but this admission EF 60-65%  Other Active Problems  Anemia - hemoglobin 10.3; hematocrit 32  Hypokalemia - supplemented -> 3.7  Hospital day # 7  This patient is critically ill due to [place] with IVH s/p EVD and hypertension and intubation and at significant risk of neurological worsening, death form hematoma expansion, hydrocephalus, heart failure, cerebral edema and herniation. This patient's care requires constant monitoring of vital signs, hemodynamics, respiratory and cardiac  monitoring, review of multiple databases, neurological assessment, discussion with family, other specialists and medical decision making of high complexity. I spent 35 minutes of neurocritical care time in the care of this patient.  Neurology has no new addition at this time. We will sign off. Please call us back if any questions. Thanks for the consult.   Rosalin Hawking, MD PhD Stroke Neurology 10/06/2015 5:59 PM   To contact Stroke Continuity provider, please refer to [email or web address] After hours, contact General Neurology

## 2015-10-06 NOTE — Progress Notes (Signed)
Nutrition Follow-up  INTERVENTION:   Increase Vital AF 1.2 @ 45 ml/hr Provides: 1296 kcal, 81 grams protein, and 875 ml H2O.  TF regimen and propofol at current rate providing 1391 total kcal/day (100 % of kcal needs)  NUTRITION DIAGNOSIS:   Inadequate oral intake related to inability to eat as evidenced by NPO status. Ongoing.   GOAL:    Patient will meet greater than or equal to 90% of their needs Met.   MONITOR:   TF tolerance, Vent status, Labs  ASSESSMENT:   Pt with past medical history of coronary artery disease, ischemic cardiomyopathy, and polysubstance abuse being admitted with right thalamic intracranial hemorrhage, right frontal external ventricular drain placed 6/16.   Patient is currently intubated on ventilator support MV: 7.9 L/min Temp (24hrs), Avg:99.7 F (37.6 C), Min:98.8 F (37.1 C), Max:100.9 F (38.3 C)  Propofol: 3.6 ml/hr provides: 95 kcal per day  Medications reviewed and include: MVI Labs reviewed: CBG's: 116-132  Diet Order:  Diet NPO time specified  Skin:  Reviewed, no issues  Last BM:  6/21  Height:   Ht Readings from Last 1 Encounters:  09/29/15 '5\' 3"'$  (1.6 m)    Weight:   Wt Readings from Last 1 Encounters:  10/06/15 102 lb 8.2 oz (46.5 kg)    Ideal Body Weight:  52.2 kg  BMI:  Body mass index is 18.16 kg/(m^2).  Estimated Nutritional Needs:   Kcal:  9678  Protein:  70-85 grams  Fluid:  > 1.5 L/day  EDUCATION NEEDS:   No education needs identified at this time  [place], [place], [place] Pager [phone] After Hours Pager

## 2015-10-06 NOTE — Progress Notes (Signed)
PULMONARY / CRITICAL CARE MEDICINE   Name: Joanna Farmer MRN: [id number] DOB: [birth year]    ADMISSION DATE:  09/29/2015 CONSULTATION DATE:  09/29/2015  REFERRING MD:  Ezequiel Essex, M.D. / [facility] EDP  CHIEF COMPLAINT:  Altered mental status  HISTORY OF PRESENT ILLNESS:   60 year old female past medical history as below, which is significant for hypertension, alcohol abuse, cocaine abuse, HSV, coronary artery disease status post stents to RCA and LAD in 2015, and ischemic cardiomyopathy with LV EF 35-40%. She presented to [facility] emergency department 6/15 with altered mental status. Reportedly the night before she suffered a fall during the night, and since that time has been sleeping all day. She was brought to the emergency department with this complaint. In emergency department she was unresponsive but reportedly breathing adequately. She had no spontaneous movement and left gaze deviation, but did have a positive gag reflex. CT scan of the head was obtained and demonstrated acute intracranial hemorrhage with intraventricular extension. Cervical spine was without radiographic evidence of injury. She was intubated for airway protection with anticipation of transfer to [facility]. PCCM accepting.   SUBJECTIVE:   Slowly moving R side but over all still drowsy. Follows simple commands.   VITAL SIGNS: BP 131/49 mmHg  Pulse 84  Temp(Src) 99.5 F (37.5 C) (Core (Comment))  Resp 21  Ht 5\' 3"  (1.6 m)  Wt 46.5 kg (102 lb 8.2 oz)  BMI 18.16 kg/m2  SpO2 100%  HEMODYNAMICS:    VENTILATOR SETTINGS: Vent Mode:  [-] PRVC FiO2 (%):  [30 %-40 %] 30 % Set Rate:  [14 bmp] 14 bmp Vt Set:  [420 mL] 420 mL PEEP:  [5 cmH20] 5 cmH20 Pressure Support:  [8 cmH20] 8 cmH20 Plateau Pressure:  [15 cmH20-16 cmH20] 15 cmH20  INTAKE / OUTPUT: I/O last 3 completed shifts: In: 3660.1 [I.V.:1650.1; NG/GT:1660; IV Piggyback:350] Out: 2493 [Urine:2450; Drains:43]  PHYSICAL EXAMINATION: General:   Sedated. Intubated with IVC drain. Follows simple commands Integument:  Warm & dry. No rash on exposed skin.  HEENT:  Drain in place. No scleral injection or icterus. Endotracheal tube in place.  Cardiovascular:  RRR, no M/R/G. Pulmonary:  Coarse bilaterally.   Abdomen: Soft. Normal bowel sounds. Nondistended.  Musculoskeletal:  Normal bulk. No joint deformity or effusion appreciated. Neurological: Off sedation > less agitation and more purposeful mm. Moves R side.   LABS:  BMET  Recent Labs Lab 10/04/15 0359 10/05/15 0320 10/06/15 0917  NA 141 139 139  K 4.0 3.3* 3.6  CL 104 102 103  CO2 29 30 28   BUN 21* 19 16  CREATININE 0.64 0.76 0.75  GLUCOSE 111* 125* 121*   Electrolytes  Recent Labs Lab 10/02/15 0415 10/03/15 0344 10/04/15 0359 10/05/15 0320 10/06/15 0917  CALCIUM 9.6 9.6 9.8 9.3 9.2  MG 1.9 2.4 2.2  --   --   PHOS 4.3 5.6* 4.5  --   --     CBC  Recent Labs Lab 10/04/15 0359 10/05/15 0320 10/06/15 0917  WBC 7.9 7.0 6.6  HGB 9.7* 8.7* 8.9*  HCT 30.4* 27.2* 28.3*  PLT 203 215 226   Coag's  Recent Labs Lab 09/29/15 1500  INR 0.96   Sepsis Markers  Recent Labs Lab 09/29/15 1500 09/29/15 1511 10/05/15 1031 10/06/15 0426  LATICACIDVEN 1.3 1.41  --   --   PROCALCITON  --   --  <0.10 0.10   ABG  Recent Labs Lab 09/30/15 0417 10/02/15 0350 10/03/15 0403  PHART  7.387 7.446 7.484*  PCO2ART 33.8* 39.2 38.7  PO2ART 186* 126* 139*   Liver Enzymes  Recent Labs Lab 09/29/15 1500  AST 47*  ALT 40  ALKPHOS 65  BILITOT 1.2  ALBUMIN 4.3   Cardiac Enzymes  Recent Labs Lab 09/29/15 1500  TROPONINI 0.04*   Glucose  Recent Labs Lab 10/05/15 1217 10/05/15 1546 10/05/15 1940 10/05/15 2335 10/06/15 0404 10/06/15 0755  GLUCAP 143* 126* 127* 136* 123* 132*   Imaging Ct Head Wo Contrast  10/06/2015  CLINICAL DATA:  Follow-up examination for intracranial hemorrhage. EXAM: CT HEAD WITHOUT CONTRAST TECHNIQUE: Contiguous axial  images were obtained from the base of the skull through the vertex without intravenous contrast. COMPARISON:  Prior CT from 10/04/2015. FINDINGS: Continued interval evolution of parenchymal hemorrhage centered at the right thalamus, measuring 2.8 x 2.4 x 1.4 cm on today's study, not significantly changed. Hematoma itself appears to be slightly less dense. Similar localized mass effect on the adjacent third ventricle. Intraventricular extension with degenerating blood products within the occipital horns of both lateral ventricles. Small amount of blood present within the third ventricle as well. Right frontal approach ventricular catheter in stable position with tip at the foramen of Monro. Similar edema along the catheter tract. Ventricle size is stable without hydrocephalus. No new intracranial hemorrhage. No other acute large vessel territory infarct. No extra-axial fluid collection. Skin staples remain at the right frontal scalp. Scalp soft tissues otherwise unremarkable. No acute abnormality about the globes and orbits. Mild mucosal thickening within the left sphenoid sinus. Visualized paranasal sinuses are otherwise clear. No mastoid effusion. Calvarium unchanged. IMPRESSION: 1. Continued interval evolution of right thalamic parenchymal hematoma with similar localized mass effect and edema. 2. Stable position of right frontal approach ventricular catheter. Stable ventricular size with small volume degenerating intraventricular hemorrhage. Electronically Signed   By: Jeannine Boga M.D.   On: 10/06/2015 05:26   STUDIES:  CT Head W/O 6/15: Acute intracranial hemorrhage with intraventricular extension and possible developing hydrocephalus Although no fractures identified in the cervical spine, the evaluation of the cervical spine is limited. CT Neck 6/15: Limited evaluation of cervical spine due to motion. No fractures evident. Port CXR 6/15: Silhouetting left hemidiaphragm. Endotracheal tube tip at the  level of the carina to nearly entering right mainstem bronchus. Orogastric tube passing below diaphragm. Port CXR 6/16>>ETT 4.5 cm above carina. Atelectasis in lung bases CT head 6/19 > no significant change since compared to prior scan. CT Head  10/04/2015 >> Degenerating RIGHT thalamus hematoma with local mass effect and edema. Similar mild hydrocephalus, stable RIGHT frontal ventriculostomy catheter. Degenerating intraventricular blood products.  MICROBIOLOGY: MRSA PCR 6/15 >> (-) Blood Ctx x2 6/15 >> (-) Blood 6/20 > (-) Sputum 6/20 > (-) Urine 6/20 > (-)  ANTIBIOTICS: 6/15 Rocephin >>6/18 6/16 Acef (prophylactic abx) >> 6/18 Zosyn 6/20 > 6/22 Vanc 6/20 > 6/22  SIGNIFICANT EVENTS: 6/15 - Admit to ICU on ventilator for ICH after transfer from [facility] ED  LINES/TUBES: OETT 7.5 6/15 >> Foley 6/15 >> OGT 6/15 >> PIV X3 IVC Drain 6/16 >>   DISCUSSION: 60 year old female past medical history of coronary artery disease, ischemic cardiomyopathy, and polysubstance abuse being admitted with right thalamic intracranial hemorrhage. She is intubated for airway protection and was transferred to [facility] for ICU admission. Will maintain on ventilator with tight blood pressure control with Cardene infusion. Neurology and neurosurgery consulted.  IVC drain placed early am 6/16.    ASSESSMENT / PLAN:  PULMONARY A: Acute  Hypoxemic resp failure 2/2 Unable to Protect Airway - Secondary to [place]. CXR 6/21 Possible LLL infiltrate, possible HCAP.  P:   Daily PST > doing fair but not fully awake 2/2 neuro issues.  Not sure if pt will be awake enough to protect airway. May need a trache.  Wean O2 for sats > 90%. Will d/c vanc. cont zosyn. PCT is N. Will order cxr in am. If better, plan to deescalate abx then.   CARDIOVASCULAR A:  Chronic Systolic CHF - EF 123456 w/ grade 1 diastolic dysfunction. RV normal. H/O Hypertension H/O CAD - S/P PCI in 2015 H/O Hyperlipidemia P:  Tight BP control with  SBP goal < 172mmHg Continue clevidipine per NSGY Continue carvedilol, HCTZ, losartan May need diuresis given CHF. Will ask NSGY if we can d/c maintenace IVF.   RENAL A:   Metabolic Acidosis - resolved Hypokalemia  P:   Monitor renal function and UOP Strict I&O Correct electrolytes as indicated   GASTROINTESTINAL A:   H/O GERD P:   TF per nutrition Pepcid IV QD  HEMATOLOGIC A:   No acute issues. P:  Follow CBC SCDs No chemical DVT ppx given ICH  INFECTIOUS A:   Persistent fever. CXR with possible LLL infiltrate (possible LLL HCAP) H/O HSV Infection P:   Cultures (-) so far. Will d/cvanc. Check CXR in am. Will deescalate in am.   ENDOCRINE A:   No acute issues P:   CBG's Q 4 hours SSI Follow glucose on BMP  NEUROLOGIC A:   Right Thalamic Intracranial Hemorrhage s/p IVC 6/16 Cocaine Abuse - urine drug screen positive for cocaine on admission H/O EtOH Abuse H/O Depression Following commands on the right side only. P:  Sedation: Propofol gtt / Fentanyl PRN. RASS goal: 0. Neurology assistance appreciated. Neurosurgery assistance appreciated. Blood pressure control as above.  FAMILY  - Updates:  Family updated at bedside. .   Critical care time with this patient today : 35 mins.    Monica Becton, MD 10/06/2015, 11:32 AM [place] [facility] Pager [phone] After 3 pm or if no answer, call [phone]

## 2015-10-06 NOTE — Progress Notes (Signed)
Cleared drain with TPA.  Clamp for 30 minutes then open.

## 2015-10-07 ENCOUNTER — Inpatient Hospital Stay (HOSPITAL_COMMUNITY): Payer: Medicaid Other

## 2015-10-07 LAB — CBC
HEMATOCRIT: 29.5 % — AB (ref 36.0–46.0)
Hemoglobin: 9.4 g/dL — ABNORMAL LOW (ref 12.0–15.0)
MCH: 30.8 pg (ref 26.0–34.0)
MCHC: 31.9 g/dL (ref 30.0–36.0)
MCV: 96.7 fL (ref 78.0–100.0)
Platelets: 289 10*3/uL (ref 150–400)
RBC: 3.05 MIL/uL — AB (ref 3.87–5.11)
RDW: 13.5 % (ref 11.5–15.5)
WBC: 6.7 10*3/uL (ref 4.0–10.5)

## 2015-10-07 LAB — BASIC METABOLIC PANEL WITH GFR
Anion gap: 7 (ref 5–15)
BUN: 15 mg/dL (ref 6–20)
CO2: 26 mmol/L (ref 22–32)
Calcium: 9.4 mg/dL (ref 8.9–10.3)
Chloride: 103 mmol/L (ref 101–111)
Creatinine, Ser: 0.69 mg/dL (ref 0.44–1.00)
GFR calc Af Amer: >60 mL/min
GFR calc non Af Amer: >60 mL/min
Glucose, Bld: 132 mg/dL — ABNORMAL HIGH (ref 65–99)
Potassium: 3.5 mmol/L (ref 3.5–5.1)
Sodium: 136 mmol/L (ref 135–145)

## 2015-10-07 LAB — GLUCOSE, CAPILLARY
GLUCOSE-CAPILLARY: 138 mg/dL — AB (ref 65–99)
GLUCOSE-CAPILLARY: 154 mg/dL — AB (ref 65–99)
Glucose-Capillary: 131 mg/dL — ABNORMAL HIGH (ref 65–99)
Glucose-Capillary: 135 mg/dL — ABNORMAL HIGH (ref 65–99)
Glucose-Capillary: 137 mg/dL — ABNORMAL HIGH (ref 65–99)
Glucose-Capillary: 141 mg/dL — ABNORMAL HIGH (ref 65–99)

## 2015-10-07 LAB — TRIGLYCERIDES: Triglycerides: 93 mg/dL (ref <150)

## 2015-10-07 LAB — PROCALCITONIN: Procalcitonin: <0.1 ng/mL

## 2015-10-07 LAB — CULTURE, RESPIRATORY W GRAM STAIN: Culture: NORMAL

## 2015-10-07 LAB — CULTURE, RESPIRATORY: SPECIAL REQUESTS: NORMAL

## 2015-10-07 NOTE — Progress Notes (Signed)
Pharmacy Antibiotic Note  Joanna Farmer is a 60 y.o. female admitted on 09/29/2015 with fever of unknown source.  Pharmacy has been consulted for Zosyn dosing.  Day #4 of abx for fever of unknown source. S/p Rocephin x3 days for UTI. Tmax 101.3 yesterday, WBC wnl. SCr stable, CrCl 50-44ml/min.  Plan: Continue Zosyn 3.375 gm IV q8h (4 hour infusion) Monitor clinical picture, renal function F/U C&S, abx deescalation / LOT  Height: 5\' 3"  (160 cm) Weight: 107 lb 2.3 oz (48.6 kg) IBW/kg (Calculated) : 52.4  Temp (24hrs), Avg:100 F (37.8 C), Min:99.1 F (37.3 C), Max:101.3 F (38.5 C)   Recent Labs Lab 10/03/15 0344 10/04/15 0359 10/05/15 0320 10/06/15 0917 10/07/15 0344  WBC 6.5 7.9 7.0 6.6 6.7  CREATININE 0.67 0.64 0.76 0.75 0.69    Estimated Creatinine Clearance: 57.4 mL/min (by C-G formula based on Cr of 0.69).    No Known Allergies  Antimicrobials this admission: Rocephin 6/15 >> 6/17 Vanc 6/20 >> 6/22 Zosyn 6/20 >>  Dose adjustments this admission: n/a  Microbiology results: 6/16 Urine cx: 30k of citrobacter (R - cefazolin) 6/20 Bcx: ngtd 6/20 TA: ngF   Thank you for allowing pharmacy to be a part of this patient's care.  Elenor Quinones, PharmD, BCPS Clinical Pharmacist Pager [phone] 10/07/2015 10:59 AM

## 2015-10-07 NOTE — Progress Notes (Signed)
No acute events Neuro unchanged EVD patent Trial of clamping again, re-open if ICPs sustained greater than 25 for 5 minutes

## 2015-10-07 NOTE — Progress Notes (Signed)
PULMONARY / CRITICAL CARE MEDICINE   Name: Joanna Farmer MRN: [id number] DOB: 24-Feb-1956    ADMISSION DATE:  09/29/2015 CONSULTATION DATE:  09/29/2015  REFERRING MD:  Ezequiel Essex, M.D. / [facility] ED[facility]  CHIEF COMPLAINT:  Altered mental status  HISTORY OF PRESENT ILLNESS:   60 year old female past medical history as below, which is significant for hypertension, alcohol abuse, cocaine abuse, HSV, coronary artery disease status post stents to RCA and LAD in 2015, and ischemic cardiomyopathy with LV EF 35-40%. She presented to [facility] emergency department 6/15 with altered mental status. Reportedly the night before she suffered a fall during the night, and since that time has been sleeping all day. She was brought to the emergency department with this complaint. In emergency department she was unresponsive but reportedly breathing adequately. She had no spontaneous movement and left gaze deviation, but did have a positive gag reflex. CT scan of the head was obtained and demonstrated acute intracranial hemorrhage with intraventricular extension. Cervical spine was without radiographic evidence of injury. She was intubated for airway protection with anticipation of transfer to [facility]. PCCM accepting.   SUBJECTIVE:   Slowly moving R side but over all still drowsy. Follows simple commands.  Doing PST but not awake enough to be extubated.   VITAL SIGNS: BP 154/57 mmHg  Pulse 72  Temp(Src) 100.4 F (38 C) (Core (Comment))  Resp 12  Ht 5\' 3"  (1.6 m)  Wt 48.6 kg (107 lb 2.3 oz)  BMI 18.98 kg/m2  SpO2 100%  HEMODYNAMICS:    VENTILATOR SETTINGS: Vent Mode:  [-] PSV;CPAP FiO2 (%):  [30 %] 30 % Set Rate:  [14 bmp] 14 bmp Vt Set:  [420 mL] 420 mL PEEP:  [5 cmH20] 5 cmH20 Pressure Support:  [8 cmH20] 8 cmH20 Plateau Pressure:  [12 cmH20-15 cmH20] 12 cmH20  INTAKE / OUTPUT: I/O last 3 completed shifts: In: 3184.5 [I.V.:1569.5; FN:[id number]; IV Piggyback:150] Out: 2821 [Urine:2765;  Drains:56]  PHYSICAL EXAMINATION: General:  Sedated. Intubated with IVC drain. Follows simple commands. Noted to be more "sedated" today.  Integument:  Warm & dry. No rash on exposed skin.  HEENT:  Drain in place. No scleral injection or icterus. Endotracheal tube in place.  Cardiovascular:  RRR, no M/R/G. Pulmonary:  Coarse bilaterally.   Abdomen: Soft. Normal bowel sounds. Nondistended.  Musculoskeletal:  Normal bulk. No joint deformity or effusion appreciated. Neurological: Off sedation > less agitation and more purposeful mm. Moves R side.   LABS:  BMET  Recent Labs Lab 10/05/15 0320 10/06/15 0917 10/07/15 0344  NA 139 139 136  K 3.3* 3.6 3.5  CL 102 103 103  CO2 30 28 26   BUN 19 16 15   CREATININE 0.76 0.75 0.69  GLUCOSE 125* 121* 132*   Electrolytes  Recent Labs Lab 10/02/15 0415 10/03/15 0344 10/04/15 0359 10/05/15 0320 10/06/15 0917 10/07/15 0344  CALCIUM 9.6 9.6 9.8 9.3 9.2 9.4  MG 1.9 2.4 2.2  --   --   --   PHOS 4.3 5.6* 4.5  --   --   --     CBC  Recent Labs Lab 10/05/15 0320 10/06/15 0917 10/07/15 0344  WBC 7.0 6.6 6.7  HGB 8.7* 8.9* 9.4*  HCT 27.2* 28.3* 29.5*  PLT 215 226 289   Coag's No results for input(s): APTT, INR in the last 168 hours. Sepsis Markers  Recent Labs Lab 10/05/15 1031 10/06/15 0426 10/07/15 0344  PROCALCITON <0.10 0.10 <0.10   ABG  Recent Labs Lab  10/02/15 0350 10/03/15 0403  PHART 7.446 7.484*  PCO2ART 39.2 38.7  PO2ART 126* 139*   Liver Enzymes No results for input(s): AST, ALT, ALKPHOS, BILITOT, ALBUMIN in the last 168 hours. Cardiac Enzymes No results for input(s): TROPONINI, PROBNP in the last 168 hours. Glucose  Recent Labs Lab 10/06/15 1119 10/06/15 1537 10/06/15 1943 10/06/15 2348 10/07/15 0353 10/07/15 0741  GLUCAP 116* 110* 117* 137* 131* 138*   Imaging Dg Chest Port 1 View  10/07/2015  CLINICAL DATA:  60 year old female with intracranial hemorrhage. Intubated. Initial encounter.  EXAM: PORTABLE CHEST 1 VIEW COMPARISON:  10/05/2015 and earlier. FINDINGS: Portable AP semi upright view at 0836 hours. Stable endotracheal tube, tip between the level the clavicles and carina. Enteric tube courses to the abdomen, tip not included. Chronic left lung base hypo ventilation over this series of exams since 09/29/2015. Retrocardiac opacity has mildly progressed since 10/04/2015. There could be an associated small left pleural effusion. Minimal streaky opacity at the right base most resembles atelectasis. Mediastinal contours remain normal. Calcified aortic atherosclerosis. No pneumothorax or pulmonary edema. The upper lobes remain clear. IMPRESSION: 1.  Stable lines and tubes. 2. Mild progression of left lung base opacity which could reflect severe atelectasis but is suspicious for left lower lobe pneumonia. Possible associated small left pleural effusion. 3. Mild right lung base atelectasis. 4. Calcified aortic atherosclerosis. Electronically Signed   By: Genevie Ann M.D.   On: 10/07/2015 08:52   STUDIES:  CT Head W/O 6/15: Acute intracranial hemorrhage with intraventricular extension and possible developing hydrocephalus Although no fractures identified in the cervical spine, the evaluation of the cervical spine is limited. CT Neck 6/15: Limited evaluation of cervical spine due to motion. No fractures evident. Port CXR 6/15: Silhouetting left hemidiaphragm. Endotracheal tube tip at the level of the carina to nearly entering right mainstem bronchus. Orogastric tube passing below diaphragm. Port CXR 6/16>>ETT 4.5 cm above carina. Atelectasis in lung bases CT head 6/19 > no significant change since compared to prior scan. CT Head  10/04/2015 >> Degenerating RIGHT thalamus hematoma with local mass effect and edema. Similar mild hydrocephalus, stable RIGHT frontal ventriculostomy catheter. Degenerating intraventricular blood products.  MICROBIOLOGY: MRSA PCR 6/15 >> (-) Blood Ctx x2 6/15 >>  (-) Blood 6/20 > (-) Sputum 6/20 > (-) Urine 6/20 > (-)  ANTIBIOTICS: 6/15 Rocephin >>6/18 6/16 Acef (prophylactic abx) >> 6/18 Zosyn 6/20 >  Vanc 6/20 > 6/22  SIGNIFICANT EVENTS: 6/15 - Admit to ICU on ventilator for ICH after transfer from [facility]  LINES/TUBES: OETT 7.5 6/15 >> Foley 6/15 >> OGT 6/15 >> PIV X3 IVC Drain 6/16 >>   DISCUSSION: 60 year old female past medical history of coronary artery disease, ischemic cardiomyopathy, and polysubstance abuse being admitted with right thalamic intracranial hemorrhage. She is intubated for airway protection and was transferred to [facility] for ICU admission. Will maintain on ventilator with tight blood pressure control with Cardene infusion. Neurology and neurosurgery consulted.  IVC drain placed early am 6/16.    ASSESSMENT / PLAN:  PULMONARY A: Acute Hypoxemic resp failure 2/2 Unable to Protect Airway - Secondary to [place]. CXR 6/21 Possible LLL infiltrate, possible HCAP.  P:   Daily PST > doing fair but not fully awake 2/2 neuro issues.  Not sure if pt will be awake enough to protect airway. May need a trache.  Wean O2 for sats > 90%. CXR on 6/23 still with LLL infiltrate. I am suspicious for HCAP in L base. Will cont zosyn  with zosyn for now. May deescalate to unasyn over the weekend.   CARDIOVASCULAR A:  Chronic Systolic CHF - EF 123456 w/ grade 1 diastolic dysfunction. RV normal. H/O Hypertension H/O CAD - S/P PCI in 2015 H/O Hyperlipidemia P:  Tight BP control with SBP goal < 153mmHg Continue clevidipine per NSGY Continue carvedilol, HCTZ, losartan   RENAL A:   Metabolic Acidosis - resolved Hypokalemia  P:   Monitor renal function and UOP Strict I&O Correct electrolytes as indicated   GASTROINTESTINAL A:   H/O GERD P:   TF per nutrition Pepcid IV QD  HEMATOLOGIC A:   No acute issues. P:  Follow CBC SCDs No chemical DVT ppx given ICH  INFECTIOUS A:   Persistent fever. CXR with possible LLL  infiltrate (possible LLL HCAP) H/O HSV Infection P:   Cultures (-) so far. Will cont with zosyn for now. I am concerned for LLL HCAP more than atelectasis. May deescalate to unasyn in 1-2 days if cultures remain (-).   ENDOCRINE A:   No acute issues P:   CBG's Q 4 hours SSI Follow glucose on BMP  NEUROLOGIC A:   Right Thalamic Intracranial Hemorrhage s/p IVC 6/16 Cocaine Abuse - urine drug screen positive for cocaine on admission H/O EtOH Abuse H/O Depression Following commands on the right side only. P:  Sedation: Propofol gtt / Fentanyl PRN. RASS goal: 0. Neurology assistance appreciated. Neuro signed off.  Neurosurgery assistance appreciated. Blood pressure control as above.  FAMILY  - Updates:  Boyfriend updated at bedside.    Critical care time with this patient today : 35 mins.    Monica Becton, MD 10/07/2015, 11:11 AM [facility] Pager [phone] After 3 pm or if no answer, call [phone]

## 2015-10-08 ENCOUNTER — Inpatient Hospital Stay (HOSPITAL_COMMUNITY): Payer: Medicaid Other

## 2015-10-08 LAB — CBC
HCT: 28.2 % — ABNORMAL LOW (ref 36.0–46.0)
HEMOGLOBIN: 9.2 g/dL — AB (ref 12.0–15.0)
MCH: 31.3 pg (ref 26.0–34.0)
MCHC: 32.6 g/dL (ref 30.0–36.0)
MCV: 95.9 fL (ref 78.0–100.0)
Platelets: 283 10*3/uL (ref 150–400)
RBC: 2.94 MIL/uL — AB (ref 3.87–5.11)
RDW: 13.4 % (ref 11.5–15.5)
WBC: 7.5 10*3/uL (ref 4.0–10.5)

## 2015-10-08 LAB — BASIC METABOLIC PANEL
Anion gap: 11 (ref 5–15)
BUN: 18 mg/dL (ref 6–20)
CHLORIDE: 101 mmol/L (ref 101–111)
CO2: 25 mmol/L (ref 22–32)
Calcium: 9.4 mg/dL (ref 8.9–10.3)
Creatinine, Ser: 0.68 mg/dL (ref 0.44–1.00)
GFR calc non Af Amer: >60 mL/min (ref >60)
Glucose, Bld: 132 mg/dL — ABNORMAL HIGH (ref 65–99)
POTASSIUM: 3.2 mmol/L — AB (ref 3.5–5.1)
SODIUM: 137 mmol/L (ref 135–145)

## 2015-10-08 LAB — GLUCOSE, CAPILLARY
GLUCOSE-CAPILLARY: 116 mg/dL — AB (ref 65–99)
GLUCOSE-CAPILLARY: 137 mg/dL — AB (ref 65–99)
Glucose-Capillary: 129 mg/dL — ABNORMAL HIGH (ref 65–99)
Glucose-Capillary: 142 mg/dL — ABNORMAL HIGH (ref 65–99)
Glucose-Capillary: 123 mg/dL — ABNORMAL HIGH (ref 65–99)
Glucose-Capillary: 111 mg/dL — ABNORMAL HIGH (ref 65–99)

## 2015-10-08 LAB — CSF CULTURE W GRAM STAIN: Culture: NO GROWTH

## 2015-10-08 LAB — TRIGLYCERIDES: TRIGLYCERIDES: 61 mg/dL (ref <150)

## 2015-10-08 MED ORDER — ALTEPLASE 2 MG IJ SOLR
2.0000 mg | Freq: Once | INTRAMUSCULAR | Status: AC
  Administered 2015-10-08: 2 mg
  Filled 2015-10-08: qty 2

## 2015-10-08 MED ORDER — FENTANYL CITRATE (PF) 100 MCG/2ML IJ SOLN
25.0000 ug | INTRAMUSCULAR | Status: DC | PRN
  Filled 2015-10-08: qty 2

## 2015-10-08 MED ORDER — HYDRALAZINE HCL 20 MG/ML IJ SOLN
10.0000 mg | INTRAMUSCULAR | Status: DC | PRN
  Administered 2015-10-11 – 2015-10-20 (×9): 10 mg via INTRAVENOUS
  Administered 2015-10-21: 20 mg via INTRAVENOUS
  Filled 2015-10-08 (×12): qty 1

## 2015-10-08 MED ORDER — POTASSIUM CHLORIDE 10 MEQ/100ML IV SOLN
10.0000 meq | INTRAVENOUS | Status: AC
  Administered 2015-10-08 (×4): 10 meq via INTRAVENOUS
  Filled 2015-10-08 (×4): qty 100

## 2015-10-08 NOTE — Progress Notes (Signed)
SLP Cancellation Note  Patient Details Name: AZENETH KOST MRN: [id number] DOB: [birth year]   Cancelled treatment:       Reason Eval/Treat Not Completed: Medical issues which prohibited therapy.  Pt just extubated at about 9am after 10 day intubation.  ST will follow up next date for BSE  Shelly Flatten, MA, [facility] SLP [phone] Lamar Sprinkles 10/08/2015, 10:34 AM

## 2015-10-08 NOTE — Progress Notes (Signed)
PULMONARY / CRITICAL CARE MEDICINE   Name: Joanna Farmer MRN: [id number] DOB: 07-25-1955    ADMISSION DATE:  09/29/2015 CONSULTATION DATE:  09/29/2015  REFERRING MD:  Ezequiel Essex, M.D. / [facility] ED[facility]  CHIEF COMPLAINT:  Altered mental status  HISTORY OF PRESENT ILLNESS:   60 year old female past medical history as below, which is significant for hypertension, alcohol abuse, cocaine abuse, HSV, coronary artery disease status post stents to RCA and LAD in 2015, and ischemic cardiomyopathy with LV EF 35-40%. She presented to [facility] emergency department 6/15 with altered mental status. Reportedly the night before she suffered a fall during the night, and since that time has been sleeping all day. She was brought to the emergency department with this complaint. In emergency department she was unresponsive but reportedly breathing adequately. She had no spontaneous movement and left gaze deviation, but did have a positive gag reflex. CT scan of the head was obtained and demonstrated acute intracranial hemorrhage with intraventricular extension. Cervical spine was without radiographic evidence of injury. She was intubated for airway protection with anticipation of transfer to [facility]. PCCM accepting.   SUBJECTIVE:   More awake today off propofol Tolerates PSV but with small Vt's  VITAL SIGNS: BP 171/68 mmHg  Pulse 60  Temp(Src) 99.3 F (37.4 C) (Core (Comment))  Resp 15  Ht 5\' 3"  (1.6 m)  Wt 50 kg (110 lb 3.7 oz)  BMI 19.53 kg/m2  SpO2 100%  HEMODYNAMICS:    VENTILATOR SETTINGS: Vent Mode:  [-] PRVC FiO2 (%):  [30 %] 30 % Set Rate:  [14 bmp] 14 bmp Vt Set:  [420 mL] 420 mL PEEP:  [5 cmH20] 5 cmH20 Pressure Support:  [8 cmH20] 8 cmH20 Plateau Pressure:  [15 cmH20] 15 cmH20  INTAKE / OUTPUT: I/O last 3 completed shifts: In: 3390.2 [I.V.:1520.2; NG/GT:1620; IV Piggyback:250] Out: 2812 [Urine:2455; Drains:57; Stool:300]  PHYSICAL EXAMINATION: General:  Sedated.  Intubated with IVC drain. Follows simple commands.  Integument:  Warm & dry. No rash on exposed skin.  HEENT:  Drain in place. No scleral injection or icterus. Endotracheal tube in place. Large tongue, no significant oral secretions.  Cardiovascular:  RRR, no M/R/G. Pulmonary:  Coarse bilaterally.   Abdomen: Soft. Normal bowel sounds. Nondistended.  Musculoskeletal:  Normal bulk. No joint deformity or effusion appreciated. Neurological:  Moves R side. Coughed on command  LABS:  BMET  Recent Labs Lab 10/06/15 0917 10/07/15 0344 10/08/15 0529  NA 139 136 137  K 3.6 3.5 3.2*  CL 103 103 101  CO2 28 26 25   BUN 16 15 18   CREATININE 0.75 0.69 0.68  GLUCOSE 121* 132* 132*   Electrolytes  Recent Labs Lab 10/02/15 0415 10/03/15 0344 10/04/15 0359  10/06/15 0917 10/07/15 0344 10/08/15 0529  CALCIUM 9.6 9.6 9.8  < > 9.2 9.4 9.4  MG 1.9 2.4 2.2  --   --   --   --   PHOS 4.3 5.6* 4.5  --   --   --   --   < > = values in this interval not displayed.  CBC  Recent Labs Lab 10/06/15 0917 10/07/15 0344 10/08/15 0529  WBC 6.6 6.7 7.5  HGB 8.9* 9.4* 9.2*  HCT 28.3* 29.5* 28.2*  PLT 226 289 283   Coag's No results for input(s): APTT, INR in the last 168 hours. Sepsis Markers  Recent Labs Lab 10/05/15 1031 10/06/15 0426 10/07/15 0344  PROCALCITON <0.10 0.10 <0.10   ABG  Recent Labs Lab  10/02/15 0350 10/03/15 0403  PHART 7.446 7.484*  PCO2ART 39.2 38.7  PO2ART 126* 139*   Liver Enzymes No results for input(s): AST, ALT, ALKPHOS, BILITOT, ALBUMIN in the last 168 hours. Cardiac Enzymes No results for input(s): TROPONINI, PROBNP in the last 168 hours. Glucose  Recent Labs Lab 10/07/15 1119 10/07/15 1543 10/07/15 1952 10/07/15 2335 10/08/15 0339 10/08/15 0811  GLUCAP 135* 141* 154* 116* 137* 129*   Imaging No results found. STUDIES:  CT Head W/O 6/15: Acute intracranial hemorrhage with intraventricular extension and possible developing hydrocephalus  Although no fractures identified in the cervical spine, the evaluation of the cervical spine is limited. CT Neck 6/15: Limited evaluation of cervical spine due to motion. No fractures evident. Port CXR 6/15: Silhouetting left hemidiaphragm. Endotracheal tube tip at the level of the carina to nearly entering right mainstem bronchus. Orogastric tube passing below diaphragm. Port CXR 6/16>>ETT 4.5 cm above carina. Atelectasis in lung bases CT head 6/19 > no significant change since compared to prior scan. CT Head  10/04/2015 >> Degenerating RIGHT thalamus hematoma with local mass effect and edema. Similar mild hydrocephalus, stable RIGHT frontal ventriculostomy catheter. Degenerating intraventricular blood products.  MICROBIOLOGY: MRSA PCR 6/15 >> (-) Blood Ctx x2 6/15 >> (-) Blood 6/20 > (-) Sputum 6/20 > (-) Urine 6/20 > (-)  ANTIBIOTICS: 6/15 Rocephin >>6/18 6/16 Acef (prophylactic abx) >> 6/18 Zosyn 6/20 >  Vanc 6/20 > 6/22  SIGNIFICANT EVENTS: 6/15 - Admit to ICU on ventilator for ICH after transfer from [facility] ED  LINES/TUBES: OETT 7.5 6/15 >> 6/24 Foley 6/15 >> OGT 6/15 >> 6/24 PIV X3 IVC Drain 6/16 >>   DISCUSSION: 60 year old female past medical history of coronary artery disease, ischemic cardiomyopathy, and polysubstance abuse being admitted with right thalamic intracranial hemorrhage. She is intubated for airway protection and was transferred to [facility] for ICU admission. Will maintain on ventilator with tight blood pressure control with Cardene infusion. Neurology and neurosurgery consulted.  IVC drain placed early am 6/16.    ASSESSMENT / PLAN:  PULMONARY A: Acute Hypoxemic resp failure 2/2 Unable to Protect Airway - Secondary to [place]. CXR 6/21 Possible LLL infiltrate, possible HCAP.  P:   Goal extubate 6/24. If fails due to airway protection then would proceed w trach Wean O2 for sats > 90%. abx for probable LLL PNA as below  CARDIOVASCULAR A:  Chronic  Systolic CHF - EF 123456 w/ grade 1 diastolic dysfunction. RV normal. H/O Hypertension H/O CAD - S/P PCI in 2015 H/O Hyperlipidemia P:  Tight BP control with SBP goal < 169mmHg Continue clevidipine if needed per NSGY Continue carvedilol, HCTZ, losartan   RENAL A:   Metabolic Acidosis - resolved Hypokalemia  P:   Monitor renal function and UOP Strict I&O Correct electrolytes as indicated   GASTROINTESTINAL A:   H/O GERD P:   TF per nutrition, hold on 6/24 for extubation. Likely place core-track and restart once extubated Pepcid IV  HEMATOLOGIC A:   No acute issues. P:  Follow CBC SCDs No chemical DVT ppx given ICH  INFECTIOUS A:   Persistent fever. CXR with possible LLL infiltrate (possible LLL HCAP) H/O HSV Infection P:   Pip/tazo day 5 of 7 on 6/24  ENDOCRINE A:   No acute issues P:   CBG's Q 4 hours SSI Follow glucose on BMP  NEUROLOGIC A:   Right Thalamic Intracranial Hemorrhage s/p IVC 6/16 Cocaine Abuse - urine drug screen positive for cocaine on admission H/O EtOH Abuse  H/O Depression Following commands on the right side only. P:  Sedation: Propofol gtt / Fentanyl PRN. RASS goal: 0. Neurology assistance appreciated. Neuro signed off.  Neurosurgery assistance appreciated, drain management as per their recommenddations Blood pressure control as above.  FAMILY  - Updates:  Boyfriend updated at bedside 6/23  Critical care time with this patient today : 35 mins.   Baltazar Apo, MD, PhD 10/08/2015, 9:03 AM [facility] [phone] or if no answer [phone]

## 2015-10-08 NOTE — Progress Notes (Signed)
ICP waveform dampened and reading 26. Zeroed transducer and briefly lowered drain to confirm patency. Patent flow and recalibrated, ICP ranging 23-26. Dr. Annette Stable notified and instructed to open drain to 10 cm H2O.

## 2015-10-08 NOTE — Procedures (Signed)
Extubation Procedure Note  Patient Details:   Name: Joanna Farmer DOB: [birth year] MRN: [id number]   Airway Documentation:     Evaluation  O2 sats: stable throughout Complications: No apparent complications Patient did tolerate procedure well. Bilateral Breath Sounds: Clear, Diminished   Yes  Patient tolerated wean. MD ordered to extubate and is bedside. No cuff leak noted but MD said to go ahead with extubation. Patient extubated to a 2 Lpm nasal cannula. No signs of dyspnea or stridor. Patient instructed on the Incentive Spirometer, however, she isn't tightening lips tight around mouth piece and is barely pulling 23mL. RN at bedside.    Myrtie Neither 10/08/2015, 9:24 AM

## 2015-10-08 NOTE — Progress Notes (Signed)
[facility] Progress Note Patient Name: Joanna Farmer DOB: Feb 26, 1956 MRN: [id number]   Date of Service  10/08/2015  HPI/Events of Note  K 3.2. Creatinine 0.68. Only has PIV.  eICU Interventions  KCl 23mEq IV x4 runs     Intervention Category Intermediate Interventions: Electrolyte abnormality - evaluation and management  Tera Partridge 10/08/2015, 6:33 AM

## 2015-10-08 NOTE — Evaluation (Signed)
Clinical/Bedside Swallow Evaluation Patient Details  Name: LUNAFREYA REDEKER MRN: [id number] Date of Birth: December 03, 1955  Today's Date: 10/08/2015 Time: SLP Start Time (ACUTE ONLY): 1504 SLP Stop Time (ACUTE ONLY): 1524 SLP Time Calculation (min) (ACUTE ONLY): 20 min  Past Medical History:  Past Medical History  Diagnosis Date  . Essential hypertension   . Depression   . Hyperlipidemia with target LDL less than 70   . Alcohol abuse   . History of cocaine use   . Herpes simplex virus (HSV) infection 2016    Antibodies  . GERD (gastroesophageal reflux disease)   . Pneumonia 01/2015  . HSV (herpes simplex virus) infection     hx/notes 03/02/2015  . CAD S/P RCA (in 2015 then 03/16/2015) 03/17/2015    a. Reportedly 2 stents placed [place], [name]; cath 03/16/2015 Mid RCA to Dist RCA lesion, 75%-90% stenosed. Moderate to severe disease in the proximal to mid RCA. Post intervention with overlapping drug-eluting stents, 2.5 x 38 mmSynergy and 2.75 x 38mm Synergy- postdilated to 3.3 mm, there is a 0% residual stenosis.   [name] History of MI (myocardial infarction)     "they say I've had one sometime down the line" (03/16/2015)  . Cardiomyopathy, ischemic - suggest prior Anterior & Inferio MI     Echo [id number]: Systolic function was severely reduced. EF~ 20%. ~Grade 1 diastolic dysfunction. Severe HK of entire anterior myocardium; Mod HK of the basal-mid anteroseptal, mid inferolateral, and mid anterolateral myocardium; mild HK of the mid inferoseptal and basal inferolateral myocardium.    Past Surgical History:  Past Surgical History  Procedure Laterality Date  . Tubal ligation  1983  . Cardiac catheterization N/A 03/16/2015    Procedure: Right/Left Heart Cath and Coronary Angiography;  Surgeon: Jettie Booze, MD;  Location: [facility];  Service: Cardiovascular;  Laterality: N/A;  . Cardiac catheterization Right 03/16/2015    Procedure: Coronary Stent Intervention;  Surgeon:  Jettie Booze, MD;  Location: [facility];  Service: Cardiovascular;  Laterality: Right;  . Coronary angioplasty with stent placement  2015    `2 stents"  . Colonoscopy N/A 06/15/2015    Procedure: Colonoscopy ;  Surgeon: Rogene Houston, MD;  [facility];  Service: Endoscopy;  Laterality: N/A;  930-moved to 06/15/15 @ 7:30 - Ann notified pt   HPI:  60 year old female past medical history which is significant for hypertension, alcohol abuse, cocaine abuse, HSV, coronary artery disease status post stents to RCA and LAD in 2015, and ischemic cardiomyopathy with LV EF 35-40%. She presented to [facility] emergency department 6/15 with altered mental status. Reportedly the night before she suffered a fall during the night, and since that time has been sleeping all day. She was brought to the emergency department with this complaint. In emergency department she was unresponsive but reportedly breathing adequately. She had no spontaneous movement and left gaze deviation, but did have a positive gag reflex. CT scan of the head was obtained and demonstrated acute intracranial hemorrhage with intraventricular extension. Cervical spine was without radiographic evidence of injury. She was intubated for airway protection with anticipation of transfer to [facility].   Assessment / Plan / Recommendation Clinical Impression  Clinical swallowing evaluation was completed using ice chips and thin lqiuids via spoon sips.  The patient presented with oral and pharyngeal dysphagia characterized by poor awareness of the bolus requiring cues to swallow and delayed swallow trigger.  Wet, gurgly vocal quality noted post swallow.  Evaluation was discontinued for the patient's safety.  Recommend keeping the patient strict NPO.  ST will continue to follow for PO readiness.      Aspiration Risk  Moderate aspiration risk;Severe aspiration risk    Diet Recommendation   Strict NPO  Medication  Administration: Via alternative means    Other  Recommendations Oral Care Recommendations: Oral care QID   Follow up Recommendations   (TBD)    Frequency and Duration min 3x week  2 weeks       Prognosis Prognosis for Safe Diet Advancement: Fair Barriers to Reach Goals: Severity of deficits      Swallow Study   General Date of Onset: 09/29/15 HPI: 60 year old female past medical history which is significant for hypertension, alcohol abuse, cocaine abuse, HSV, coronary artery disease status post stents to RCA and LAD in 2015, and ischemic cardiomyopathy with LV EF 35-40%. She presented to [facility] emergency department 6/15 with altered mental status. Reportedly the night before she suffered a fall during the night, and since that time has been sleeping all day. She was brought to the emergency department with this complaint. In emergency department she was unresponsive but reportedly breathing adequately. She had no spontaneous movement and left gaze deviation, but did have a positive gag reflex. CT scan of the head was obtained and demonstrated acute intracranial hemorrhage with intraventricular extension. Cervical spine was without radiographic evidence of injury. She was intubated for airway protection with anticipation of transfer to [facility]. Type of Study: Bedside Swallow Evaluation Previous Swallow Assessment: None noted.   Diet Prior to this Study: NPO Temperature Spikes Noted: Yes Respiratory Status: Nasal cannula History of Recent Intubation: Yes Length of Intubations (days): 10 days Date extubated: 10/07/15 Behavior/Cognition: Cooperative;Requires cueing Oral Cavity Assessment: Within Functional Limits Oral Care Completed by SLP: No Oral Cavity - Dentition: Edentulous Self-Feeding Abilities: Total assist Patient Positioning: Upright in bed Baseline Vocal Quality: Aphonic Volitional Cough: Weak Volitional Swallow: Able to elicit    Oral/Motor/Sensory  Function     Ice Chips Ice chips: Impaired Presentation: Spoon Oral Phase Impairments: Poor awareness of bolus;Impaired mastication Oral Phase Functional Implications: Oral holding;Prolonged oral transit Pharyngeal Phase Impairments: Suspected delayed Swallow   Thin Liquid Thin Liquid: Impaired Presentation: Spoon Oral Phase Impairments: Poor awareness of bolus Oral Phase Functional Implications: Prolonged oral transit Pharyngeal  Phase Impairments: Suspected delayed Swallow;Wet Vocal Quality    Nectar Thick Nectar Thick Liquid: Not tested   Honey Thick Honey Thick Liquid: Not tested   Puree Puree: Not tested   Solid   GO   Solid: Not tested    Functional Assessment Tool Used: ASHA NOMS Functional Limitations: Swallowing Swallow Current Status [id number]): 100 percent impaired, limited or restricted Swallow Goal Status [id number]): At least 60 percent but less than 80 percent impaired, limited or restricted    Shelly Flatten, [place], [place] [facility] SLP [phone] Lamar Sprinkles 10/08/2015,3:31 PM

## 2015-10-08 NOTE — Progress Notes (Signed)
Neuro unchanged EVD clogged, flushed; will flush again with tpa Continue EVD

## 2015-10-08 NOTE — Progress Notes (Addendum)
After explicitly clear instruction to boyfriend that the patient was not to have anything by mouth (on multiple occassions) RN found him giving the patient soda with a straw after he had also manipulated the bed in order to reach her better (see EVD output, 21 cc this hour).  Pt boyfriend re-educated on the importance of keeping pt NPO as well as not repositioning the bed or the patient.  Pt lungs auscultated, rhonchi in upper lobes.   Security will be called if there is a reoccurrence, pt boyfriend is aware and states understanding.  Dr. Lamonte Sakai with CCM made aware.

## 2015-10-09 ENCOUNTER — Inpatient Hospital Stay (HOSPITAL_COMMUNITY): Payer: Medicaid Other

## 2015-10-09 LAB — BASIC METABOLIC PANEL
Anion gap: 11 (ref 5–15)
BUN: 16 mg/dL (ref 6–20)
CALCIUM: 9.2 mg/dL (ref 8.9–10.3)
CO2: 23 mmol/L (ref 22–32)
CREATININE: 0.72 mg/dL (ref 0.44–1.00)
Chloride: 98 mmol/L — ABNORMAL LOW (ref 101–111)
GLUCOSE: 140 mg/dL — AB (ref 65–99)
Potassium: 3.2 mmol/L — ABNORMAL LOW (ref 3.5–5.1)
SODIUM: 132 mmol/L — AB (ref 135–145)

## 2015-10-09 LAB — POCT I-STAT 3, ART BLOOD GAS (G3+)
Acid-Base Excess: 2 mmol/L (ref 0.0–2.0)
Bicarbonate: 26.2 mEq/L — ABNORMAL HIGH (ref 20.0–24.0)
O2 Saturation: 100 %
PCO2 ART: 39 mmHg (ref 35.0–45.0)
PH ART: 7.435 (ref 7.350–7.450)
TCO2: 27 mmol/L (ref 0–100)
pO2, Arterial: 163 mmHg — ABNORMAL HIGH (ref 80.0–100.0)

## 2015-10-09 LAB — CULTURE, BLOOD (ROUTINE X 2)
CULTURE: NO GROWTH
CULTURE: NO GROWTH

## 2015-10-09 LAB — GLUCOSE, CAPILLARY
GLUCOSE-CAPILLARY: 111 mg/dL — AB (ref 65–99)
GLUCOSE-CAPILLARY: 121 mg/dL — AB (ref 65–99)
GLUCOSE-CAPILLARY: 126 mg/dL — AB (ref 65–99)
Glucose-Capillary: 132 mg/dL — ABNORMAL HIGH (ref 65–99)
Glucose-Capillary: 163 mg/dL — ABNORMAL HIGH (ref 65–99)
Glucose-Capillary: 134 mg/dL — ABNORMAL HIGH (ref 65–99)

## 2015-10-09 LAB — MAGNESIUM: Magnesium: 2.2 mg/dL (ref 1.7–2.4)

## 2015-10-09 MED ORDER — MIDAZOLAM HCL 2 MG/2ML IJ SOLN: 2.0000 mg | INTRAMUSCULAR | Status: DC | PRN

## 2015-10-09 MED ORDER — FENTANYL CITRATE (PF) 100 MCG/2ML IJ SOLN
100.0000 ug | Freq: Once | INTRAMUSCULAR | Status: AC
  Administered 2015-10-09: 100 ug via INTRAVENOUS

## 2015-10-09 MED ORDER — ALTEPLASE 2 MG IJ SOLR
2.0000 mg | Freq: Once | INTRAMUSCULAR | Status: AC
  Administered 2015-10-09: 2 mg

## 2015-10-09 MED ORDER — POTASSIUM CHLORIDE 20 MEQ/15ML (10%) PO SOLN
30.0000 meq | ORAL | Status: AC
  Administered 2015-10-09 (×2): 30 meq
  Filled 2015-10-09 (×2): qty 30

## 2015-10-09 MED ORDER — ETOMIDATE 2 MG/ML IV SOLN
40.0000 mg | Freq: Once | INTRAVENOUS | Status: AC
  Administered 2015-10-09: 40 mg via INTRAVENOUS

## 2015-10-09 MED ORDER — CARVEDILOL 12.5 MG PO TABS
12.5000 mg | ORAL_TABLET | Freq: Two times a day (BID) | ORAL | Status: DC
  Administered 2015-10-09 – 2015-11-10 (×62): 12.5 mg via ORAL
  Filled 2015-10-09 (×62): qty 1

## 2015-10-09 MED ORDER — MIDAZOLAM HCL 2 MG/2ML IJ SOLN
INTRAMUSCULAR | Status: AC
  Administered 2015-10-09: 2 mg
  Filled 2015-10-09: qty 2

## 2015-10-09 MED ORDER — SODIUM CHLORIDE 0.9 % IV SOLN
25.0000 ug/h | INTRAVENOUS | Status: DC
  Administered 2015-10-09: 50 ug/h via INTRAVENOUS
  Filled 2015-10-09 (×2): qty 50

## 2015-10-09 MED ORDER — FENTANYL BOLUS VIA INFUSION
50.0000 ug | INTRAVENOUS | Status: DC | PRN
  Filled 2015-10-09: qty 50

## 2015-10-09 MED ORDER — FENTANYL CITRATE (PF) 100 MCG/2ML IJ SOLN
INTRAMUSCULAR | Status: AC
  Filled 2015-10-09: qty 4

## 2015-10-09 MED ORDER — FENTANYL CITRATE (PF) 100 MCG/2ML IJ SOLN: 50.0000 ug | Freq: Once | INTRAMUSCULAR | Status: AC

## 2015-10-09 NOTE — Progress Notes (Signed)
PT Cancellation Note  Patient Details Name: TAEJA PATMON MRN: [id number] DOB: Feb 27, 1956   Cancelled Treatment:    Reason Eval/Treat Not Completed: Medical issues which prohibited therapy.  Per RN, increased size of hemorrhage on CT.  RN recommending holding.  PT will check back tomorrow.  Thanks,    Barbarann Ehlers. [place], [place], DPT [phone]   10/09/2015, 4:52 PM

## 2015-10-09 NOTE — Procedures (Signed)
Intubation Procedure Note KEISHONA MUNRO [id number] Nov 07, 1955  Procedure: Intubation Indications: Airway protection and maintenance  Procedure Details Consent: Unable to obtain consent because of altered level of consciousness. Time Out: Verified patient identification, verified procedure, site/side was marked, verified correct patient position, special equipment/implants available, medications/allergies/relevent history reviewed, required imaging and test results available.  Performed  Maximum sterile technique was used including gloves, gown and hand hygiene.  MAC4 Glidescope  7.5 ETT placed under direct vis with Mac-4 Glidescope, position confirmed by ETCO2, auscultation, direct visualization     Evaluation Hemodynamic Status: BP stable throughout; O2 sats: stable throughout Patient's Current Condition: stable Complications: No apparent complications Patient did tolerate procedure well. Chest X-ray ordered to verify placement.  CXR: pending.   Baltazar Apo, MD, PhD 10/09/2015, 4:53 PM [facility] [phone] or if no answer [phone]

## 2015-10-09 NOTE — Progress Notes (Signed)
PULMONARY / CRITICAL CARE MEDICINE   Name: Joanna Farmer MRN: [id number] DOB: 1955-07-11    ADMISSION DATE:  09/29/2015 CONSULTATION DATE:  09/29/2015  REFERRING MD:  Ezequiel Essex, M.D. / [facility] EDP  CHIEF COMPLAINT:  Altered mental status  HISTORY OF PRESENT ILLNESS:   60 year old female past medical history as below, which is significant for hypertension, alcohol abuse, cocaine abuse, HSV, coronary artery disease status post stents to RCA and LAD in 2015, and ischemic cardiomyopathy with LV EF 35-40%. She presented to [facility] emergency department 6/15 with altered mental status. Reportedly the night before she suffered a fall during the night, and since that time has been sleeping all day. She was brought to the emergency department with this complaint. In emergency department she was unresponsive but reportedly breathing adequately. She had no spontaneous movement and left gaze deviation, but did have a positive gag reflex. CT scan of the head was obtained and demonstrated acute intracranial hemorrhage with intraventricular extension. Cervical spine was without radiographic evidence of injury. She was intubated for airway protection with anticipation of transfer to [facility]. PCCM accepting.   SUBJECTIVE:   Extubated successfully Snoring respirations Able to swallow secretions, unable to participate with IS  VITAL SIGNS: BP 125/55 mmHg  Pulse 79  Temp(Src) 98.6 F (37 C) (Oral)  Resp 23  Ht 5\' 3"  (1.6 m)  Wt 48.8 kg (107 lb 9.4 oz)  BMI 19.06 kg/m2  SpO2 98%  HEMODYNAMICS:    VENTILATOR SETTINGS:    INTAKE / OUTPUT: I/O last 3 completed shifts: In: 3592.3 [I.V.:1871.8; NG/GT:1520.5; IV [id number] Out: [id number] [Urine:2470; Drains:181; Stool:600]  PHYSICAL EXAMINATION: General:  Ill appearing IVC drain. Somnolent  Integument:  Warm & dry. No rash on exposed skin.  HEENT:  Drain in place. No scleral injection or icterus. no significant oral secretions.  Snoring resps  Cardiovascular:  RRR, no M/R/G. Pulmonary:  Coarse bilaterally.   Abdomen: Soft. Normal bowel sounds. Nondistended.  Musculoskeletal:  Normal bulk. No joint deformity or effusion appreciated. Neurological:  Moves R side. Able to attempt to answer questions but very weak voice,   LABS:  BMET  Recent Labs Lab 10/07/15 0344 10/08/15 0529 10/09/15 0324  NA 136 137 132*  K 3.5 3.2* 3.2*  CL 103 101 98*  CO2 26 25 23   BUN 15 18 16   CREATININE 0.69 0.68 0.72  GLUCOSE 132* 132* 140*   Electrolytes  Recent Labs Lab 10/03/15 0344 10/04/15 0359  10/07/15 0344 10/08/15 0529 10/09/15 0324  CALCIUM 9.6 9.8  < > 9.4 9.4 9.2  MG 2.4 2.2  --   --   --  2.2  PHOS 5.6* 4.5  --   --   --   --   < > = values in this interval not displayed.  CBC  Recent Labs Lab 10/06/15 0917 10/07/15 0344 10/08/15 0529  WBC 6.6 6.7 7.5  HGB 8.9* 9.4* 9.2*  HCT 28.3* 29.5* 28.2*  PLT 226 289 283   Coag's No results for input(s): APTT, INR in the last 168 hours. Sepsis Markers  Recent Labs Lab 10/05/15 1031 10/06/15 0426 10/07/15 0344  PROCALCITON <0.10 0.10 <0.10   ABG  Recent Labs Lab 10/03/15 0403  PHART 7.484*  PCO2ART 38.7  PO2ART 139*   Liver Enzymes No results for input(s): AST, ALT, ALKPHOS, BILITOT, ALBUMIN in the last 168 hours. Cardiac Enzymes No results for input(s): TROPONINI, PROBNP in the last 168 hours. Glucose  Recent Labs Lab 10/08/15 1140  10/08/15 1542 10/08/15 1950 10/08/15 2357 10/09/15 0350 10/09/15 0735  GLUCAP 142* 123* 111* 126* 132* 163*   Imaging Dg Abd Portable 1v  10/08/2015  CLINICAL DATA:  Nasogastric tube placement.  Initial encounter. EXAM: PORTABLE ABDOMEN - 1 VIEW COMPARISON:  None. FINDINGS: The patient's enteric tube is noted coiling within the stomach, ending overlying the pylorus. This will likely progress further with time. The visualized bowel gas pattern is unremarkable. Scattered air and stool filled loops of  colon are seen; no abnormal dilatation of small bowel loops is seen to suggest small bowel obstruction. No free intra-abdominal air is identified, though evaluation for free air is limited on a single supine view. The visualized osseous structures are within normal limits; the sacroiliac joints are unremarkable in appearance. Mild left basilar opacity could reflect pneumonia, depending on the patient's symptoms. IMPRESSION: 1. Enteric tube noted coiling within the stomach, ending overlying the pylorus. This will likely progress further with time. 2. Mild left basilar airspace opacity could reflect pneumonia, depending on the patient's symptoms. 3. Unremarkable bowel gas pattern; no free intra-abdominal air seen. Electronically Signed   By: Garald Balding M.D.   On: 10/08/2015 19:16   STUDIES:  CT Head W/O 6/15: Acute intracranial hemorrhage with intraventricular extension and possible developing hydrocephalus Although no fractures identified in the cervical spine, the evaluation of the cervical spine is limited. CT Neck 6/15: Limited evaluation of cervical spine due to motion. No fractures evident. Port CXR 6/15: Silhouetting left hemidiaphragm. Endotracheal tube tip at the level of the carina to nearly entering right mainstem bronchus. Orogastric tube passing below diaphragm. Port CXR 6/16>>ETT 4.5 cm above carina. Atelectasis in lung bases CT head 6/19 > no significant change since compared to prior scan. CT Head  10/04/2015 >> Degenerating RIGHT thalamus hematoma with local mass effect and edema. Similar mild hydrocephalus, stable RIGHT frontal ventriculostomy catheter. Degenerating intraventricular blood products.  MICROBIOLOGY: MRSA PCR 6/15 >> (-) Blood Ctx x2 6/15 >> (-) Blood 6/20 > (-) Sputum 6/20 > (-) Urine 6/20 > (-)  ANTIBIOTICS: 6/15 Rocephin >>6/18 6/16 Acef (prophylactic abx) >> 6/18 Zosyn 6/20 >  Vanc 6/20 > 6/22  SIGNIFICANT EVENTS: 6/15 - Admit to ICU on ventilator for  ICH after transfer from [facility] ED  LINES/TUBES: OETT 7.5 6/15 >> 6/24 Foley 6/15 >> OGT 6/15 >> 6/24 PIV X3 IVC Drain 6/16 >>   DISCUSSION: 60 year old female past medical history of coronary artery disease, ischemic cardiomyopathy, and polysubstance abuse being admitted with right thalamic intracranial hemorrhage. She is intubated for airway protection and was transferred to [facility] for ICU admission. Will maintain on ventilator with tight blood pressure control with Cardene infusion. Neurology and neurosurgery consulted.  IVC drain placed early am 6/16.    ASSESSMENT / PLAN:  PULMONARY A: Acute Hypoxemic resp failure 2/2 Unable to Protect Airway - Secondary to [place]. CXR 6/21 Possible LLL infiltrate, possible HCAP.  P:   Tolerating extubation, snoring resps but no notable secretions and appears to protect airway Wean O2 for sats > 90%. abx for probable LLL PNA as below  CARDIOVASCULAR A:  Chronic Systolic CHF - EF [id number] w/ grade 1 diastolic dysfunction. RV normal. H/O Hypertension H/O CAD - S/P PCI in 2015 H/O Hyperlipidemia P:  Tight BP control with SBP goal < 130mmHg Continue clevidipine if needed per NSGY Continue carvedilol and increase 6/25, HCTZ, losartan per tube   RENAL A:   Metabolic Acidosis - resolved Hypokalemia  P:   Monitor renal  function and UOP Strict I&O Correct electrolytes as indicated   GASTROINTESTINAL A:   H/O GERD P:   Restart TF  Pepcid IV  HEMATOLOGIC A:   No acute issues. P:  Follow CBC SCDs No chemical DVT ppx given ICH  INFECTIOUS A:   Persistent fever. CXR with possible LLL infiltrate (possible LLL HCAP) H/O HSV Infection P:   Pip/tazo day 6 of 7 on 6/25  ENDOCRINE A:   No acute issues P:   CBG's Q 4 hours SSI Follow glucose on BMP  NEUROLOGIC A:   Right Thalamic Intracranial Hemorrhage s/p IVC 6/16 Cocaine Abuse - urine drug screen positive for cocaine on admission H/O EtOH Abuse H/O Depression Following  commands on the right side only. P:  Sedation: off RASS goal: 0. Neurology assistance appreciated. Neuro signed off.  Neurosurgery assistance appreciated, drain management as per their recs. May need to be de-clotted 6/25 Blood pressure control as above.  FAMILY  - Updates:  Boyfriend updated at bedside 6/25   Independent Cc time 59  Baltazar Apo, MD, PhD 10/09/2015, 10:57 AM [place] [facility] [phone] or if no answer [phone]

## 2015-10-09 NOTE — Progress Notes (Signed)
SLP Cancellation Note  Patient Details Name: Joanna Farmer MRN: [id number] DOB: [birth year]   Cancelled treatment:       Reason Eval/Treat Not Completed: Patient at procedure or test/unavailable. Pt at head CT. Will continue attempts.   Kern Reap, [place], CCC-SLP 10/09/2015, 3:02 PM [phone]

## 2015-10-09 NOTE — Procedures (Signed)
Intubation Procedure Note AKEERA HENNE [id number] 11-Jan-1956  Procedure: Intubation Indications: Airway protection and maintenance  Procedure Details Consent: Unable to obtain consent because of altered level of consciousness. Time Out: Verified patient identification, verified procedure, site/side was marked, verified correct patient position, special equipment/implants available, medications/allergies/relevent history reviewed, required imaging and test results available.  Performed  Maximum sterile technique was used including gloves, gown, hand hygiene and mask.  MAC and 4    Evaluation Hemodynamic Status: BP stable throughout; O2 sats: stable throughout Patient's Current Condition: stable Complications: No apparent complications Patient did tolerate procedure well. Chest X-ray ordered to verify placement.  CXR: pending.    Pt intubated using glidescope #4 Mac with 7.5 ETT secured at 24 @ lip for airway protection following change in mental status. Pt stable throughout procedure with no complications. Positive color change on ETCO2 noted. Bilateral breath sounds. Direct visualization. CXR pending. RT will continue to monitor.   Ellan Lambert 10/09/2015

## 2015-10-09 NOTE — Progress Notes (Signed)
Unable to obtain ICP waveform, and IVC not draining/pulsating.  Pt is neurologically unchanged.  MD paged.  Will see upon rounding.

## 2015-10-09 NOTE — Progress Notes (Signed)
[facility] ADULT ICU REPLACEMENT PROTOCOL FOR AM LAB REPLACEMENT ONLY  The patient does apply for the [facility] Adult ICU Electrolyte Replacment Protocol based on the criteria listed below:   1. Is GFR >/= 40 ml/min? Yes.    Patient's GFR today is >60 2. Is urine output >/= 0.5 ml/kg/hr for the last 6 hours? Yes.   Patient's UOP is 1.1 ml/kg/hr 3. Is BUN < 60 mg/dL? Yes.    Patient's BUN today is 16 4. Abnormal electrolyte(s): K+3.2 5. Ordered repletion with: protocol 6. If a panic level lab has been reported, has the CCM MD in charge been notified? No..   Physician:  Rozelle Logan, [place] 10/09/2015 4:43 AM

## 2015-10-09 NOTE — Progress Notes (Signed)
Overall unchanged. Neurologic exam stable. Ventricular train clogged again this morning. Drain flushed with TPA and has begun to flow again slowly.  Continue current management. No new recommendations.

## 2015-10-09 NOTE — Progress Notes (Addendum)
Pt harder to arouse than during previous assessments and not following commands as she previously was.  ICP monitor zeroed and sustaining in the low 20s (previously reading between 6-15).  Pt head midline, HOB > 45 degrees, EVD draining frank blood.  Dr. Annette Stable with neurosurgery paged, STAT head CT ordered.  Will monitor.

## 2015-10-10 ENCOUNTER — Encounter (HOSPITAL_COMMUNITY): Payer: Self-pay | Admitting: Radiology

## 2015-10-10 ENCOUNTER — Inpatient Hospital Stay (HOSPITAL_COMMUNITY): Payer: Medicaid Other

## 2015-10-10 DIAGNOSIS — J9600 Acute respiratory failure, unspecified whether with hypoxia or hypercapnia: Secondary | ICD-10-CM

## 2015-10-10 LAB — GLUCOSE, CAPILLARY
GLUCOSE-CAPILLARY: 126 mg/dL — AB (ref 65–99)
GLUCOSE-CAPILLARY: 122 mg/dL — AB (ref 65–99)
GLUCOSE-CAPILLARY: 130 mg/dL — AB (ref 65–99)
Glucose-Capillary: 123 mg/dL — ABNORMAL HIGH (ref 65–99)
Glucose-Capillary: 133 mg/dL — ABNORMAL HIGH (ref 65–99)

## 2015-10-10 LAB — BASIC METABOLIC PANEL
ANION GAP: 11 (ref 5–15)
BUN: 33 mg/dL — ABNORMAL HIGH (ref 6–20)
CALCIUM: 9.4 mg/dL (ref 8.9–10.3)
CO2: 22 mmol/L (ref 22–32)
Chloride: 102 mmol/L (ref 101–111)
Creatinine, Ser: 1.44 mg/dL — ABNORMAL HIGH (ref 0.44–1.00)
GFR calc Af Amer: 45 mL/min — ABNORMAL LOW (ref >60)
GFR, EST NON AFRICAN AMERICAN: 39 mL/min — AB (ref >60)
GLUCOSE: 122 mg/dL — AB (ref 65–99)
Potassium: 4.3 mmol/L (ref 3.5–5.1)
Sodium: 135 mmol/L (ref 135–145)

## 2015-10-10 LAB — MAGNESIUM: Magnesium: 2.2 mg/dL (ref 1.7–2.4)

## 2015-10-10 MED ORDER — FENTANYL CITRATE (PF) 100 MCG/2ML IJ SOLN
100.0000 ug | INTRAMUSCULAR | Status: AC | PRN
  Administered 2015-10-10: 50 ug via INTRAVENOUS
  Administered 2015-10-11: 75 ug via INTRAVENOUS
  Administered 2015-10-12: 100 ug via INTRAVENOUS
  Filled 2015-10-10 (×4): qty 2

## 2015-10-10 MED ORDER — FENTANYL CITRATE (PF) 100 MCG/2ML IJ SOLN
100.0000 ug | INTRAMUSCULAR | Status: DC | PRN
  Administered 2015-10-11 – 2015-10-21 (×15): 100 ug via INTRAVENOUS
  Filled 2015-10-10 (×16): qty 2

## 2015-10-10 MED ORDER — FENTANYL CITRATE (PF) 100 MCG/2ML IJ SOLN
INTRAMUSCULAR | Status: AC
  Filled 2015-10-10: qty 2

## 2015-10-10 MED ORDER — SODIUM CHLORIDE 0.9 % IV BOLUS (SEPSIS)
250.0000 mL | Freq: Once | INTRAVENOUS | Status: AC
  Administered 2015-10-10: 250 mL via INTRAVENOUS

## 2015-10-10 NOTE — Progress Notes (Signed)
[place] Progress Note Patient Name: Joanna Farmer DOB: 1956/01/30 MRN: [id number]   Date of Service  10/10/2015  HPI/Events of Note    eICU Interventions  Fentanyl prn added     Intervention Category Minor Interventions: Agitation / anxiety - evaluation and management  [name] S. 10/10/2015, 10:33 PM

## 2015-10-10 NOTE — Progress Notes (Signed)
Radiologist called. Pt head CT showed enlarging right thalamus hematoma with increasing local mass effect. Neurosurgery MD notified. Results discussed with neurosurgeon. Will continue to monitor.

## 2015-10-10 NOTE — Progress Notes (Signed)
TO WHOM IT MAY CONCERN  This is to state that Joanna Farmer with dob  1955-10-13 is sister of Mr Randa Ngo . She is critically ill with date of admission 09/29/2015 and unclear as of  10/10/2015  what her chances of recovery are but she is in ICU and her life is in grave danger. Therefore, it is very appropriate for her brother Mr Owens Shark to spend this time with her which could well be her last days. Please do not hesitate to contact me if you have any further questions. This could be a day to day situation and I recommend reassessment in a week.    Sincerely yours     Dr. Brand Males, M.D., [facility].C.P Pulmonary and Critical Care Medicine Staff [place] [facility] Telephone [phone]   10/10/2015 11:59 AM

## 2015-10-10 NOTE — Progress Notes (Signed)
Worsening thalamic hemorrhage Drain patent PERRL, small Weakly withdraws bilaterally Poor prognosis, will discuss with family this PM

## 2015-10-10 NOTE — Progress Notes (Signed)
Fentanyl drip stopped 10/09/15 at 23:00.  Fentanyl drip 2,500 mcg in 250 ml, 225 ml wasted in sink, witnessed by Margo Aye, RN.

## 2015-10-10 NOTE — Progress Notes (Signed)
PT Cancellation Note  Patient Details Name: Joanna Farmer MRN: [id number] DOB: 03-19-1956   Cancelled Treatment:    Reason Eval/Treat Not Completed: Medical issues which prohibited therapy.  Pt's medical status is declining.  Per Neurosurgery note "poor prognosis".  PT to sign off at this time.  Please re-order if pt turns a corner and starts improving.   Thanks,    Barbarann Ehlers. [place], [place], DPT [phone]   10/10/2015, 6:23 PM

## 2015-10-10 NOTE — Progress Notes (Signed)
PULMONARY / CRITICAL CARE MEDICINE   Name: Joanna Farmer MRN: [id number] DOB: Nov 02, 1955    ADMISSION DATE:  09/29/2015 CONSULTATION DATE:  09/29/2015  REFERRING MD:  Ezequiel Essex, M.D. / [facility] EDP  CHIEF COMPLAINT:  Altered mental status  BRIEF   60 year old female past medical history as below, which is significant for hypertension, alcohol abuse, cocaine abuse, HSV, coronary artery disease status post stents to RCA and LAD in 2015, and ischemic cardiomyopathy with LV EF 35-40%. She presented to [facility] emergency department 6/15 with altered mental status. Reportedly the night before she suffered a fall during the night, and since that time has been sleeping all day. She was brought to the emergency department with this complaint. In emergency department she was unresponsive but reportedly breathing adequately. She had no spontaneous movement and left gaze deviation, but did have a positive gag reflex. CT scan of the head was obtained and demonstrated acute intracranial hemorrhage with intraventricular extension. Cervical spine was without radiographic evidence of injury. She was intubated for airway protection with anticipation of transfer to [facility]. PCCM accepting.    STUDIES:  CT Head W/O 6/15: Acute intracranial hemorrhage with intraventricular extension and possible developing hydrocephalus Although no fractures identified in the cervical spine, the evaluation of the cervical spine is limited. CT Neck 6/15: Limited evaluation of cervical spine due to motion. No fractures evident. Port CXR 6/15: Silhouetting left hemidiaphragm. Endotracheal tube tip at the level of the carina to nearly entering right mainstem bronchus. Orogastric tube passing below diaphragm. Port CXR 6/16>>ETT 4.5 cm above carina. Atelectasis in lung bases CT head 6/19 > no significant change since compared to prior scan. CT Head  10/04/2015 >> Degenerating RIGHT thalamus hematoma with local mass effect  and edema. Similar mild hydrocephalus, stable RIGHT frontal ventriculostomy catheter. Degenerating intraventricular blood products.  MICROBIOLOGY: MRSA PCR 6/15 >> (-) Blood Ctx x2 6/15 >> (-) Blood 6/20 > (-) Sputum 6/20 > (-) Urine 6/20 > (-)  ANTIBIOTICS: 6/15 Rocephin >>6/18 6/16 Acef (prophylactic abx) >> 6/18 Zosyn 6/20 >  Vanc 6/20 > 6/22    LINES/TUBES: OETT 7.5 6/15 >> 6/24 Foley 6/15 >> OGT 6/15 >> 6/24 PIV X3 IVC Drain 6/16 >>    SIGNIFICANT EVENTS: 6/15 - Admit to ICU on ventilator for ICH after transfer from [place] ED 6/25 Extubated successfully - reintuated    SUBJECTIVE/OVERNIGHT/INTERVAL HX 6/26 - reintubated yesteda. CT today shows worsening rt thalamus hematoma with local mass effect. NSGY aware . He is less responsive today. WorseningCreat. Not on cleviprex. Not on pressors. Brother Legrand Como and sister at bedside. Per RN - very poor responsive state and son might be open to terminal wean depending on NSGY conversaton  VITAL SIGNS: BP 118/49 mmHg  Pulse 62  Temp(Src) 99 F (37.2 C) (Axillary)  Resp 16  Ht 5\' 3"  (1.6 m)  Wt 49.3 kg (108 lb 11 oz)  BMI 19.26 kg/m2  SpO2 100%  HEMODYNAMICS:    VENTILATOR SETTINGS: Vent Mode:  [-] PRVC FiO2 (%):  [30 %-60 %] 30 % Set Rate:  [14 bmp-16 bmp] 16 bmp Vt Set:  [420 mL] 420 mL PEEP:  [5 cmH20] 5 cmH20 Plateau Pressure:  [15 cmH20-20 cmH20] 15 cmH20  INTAKE / OUTPUT: I/O last 3 completed shifts: In: 4302.7 [I.V.:2122.2; NG/GT:1930.5; IV Piggyback:250] Out: 2025 [Urine:1626; Drains:247; [id number]  PHYSICAL EXAMINATION: General:  Ill appearing IVC drain.  Integument:  Warm & dry. No rash on exposed skin.  HEENT:  Drain in  place. No scleral injection or icterus. no significant oral secretions. Snoring resps  Cardiovascular:  RRR, no M/R/G. Pulmonary:  Coarse bilaterally.   Abdomen: Soft. Normal bowel sounds. Nondistended.  Musculoskeletal:  Normal bulk. No joint deformity or effusion  appreciated. Neurological: RASS -5 equivalent without sedation LABS: PULMONARY  Recent Labs Lab 10/09/15 1808  PHART 7.435  PCO2ART 39.0  PO2ART 163.0*  HCO3 26.2*  TCO2 27  O2SAT 100.0    CBC  Recent Labs Lab 10/06/15 0917 10/07/15 0344 10/08/15 0529  HGB 8.9* 9.4* 9.2*  HCT 28.3* 29.5* 28.2*  WBC 6.6 6.7 7.5  PLT 226 289 283    COAGULATION No results for input(s): INR in the last 168 hours.  CARDIAC  No results for input(s): TROPONINI in the last 168 hours. No results for input(s): PROBNP in the last 168 hours.   CHEMISTRY  Recent Labs Lab 10/04/15 0359  10/06/15 0917 10/07/15 0344 10/08/15 0529 10/09/15 0324 10/10/15 0418  NA 141  < > 139 136 137 132* 135  K 4.0  < > 3.6 3.5 3.2* 3.2* 4.3  CL 104  < > 103 103 101 98* 102  CO2 29  < > 28 26 25 23 22   GLUCOSE 111*  < > 121* 132* 132* 140* 122*  BUN 21*  < > 16 15 18 16  33*  CREATININE 0.64  < > 0.75 0.69 0.68 0.72 1.44*  CALCIUM 9.8  < > 9.2 9.4 9.4 9.2 9.4  MG 2.2  --   --   --   --  2.2 2.2  PHOS 4.5  --   --   --   --   --   --   < > = values in this interval not displayed. Estimated Creatinine Clearance: 32.3 mL/min (by C-G formula based on Cr of 1.44).   LIVER No results for input(s): AST, ALT, ALKPHOS, BILITOT, PROT, ALBUMIN, INR in the last 168 hours.   INFECTIOUS  Recent Labs Lab 10/05/15 1031 10/06/15 0426 10/07/15 0344  PROCALCITON <0.10 0.10 <0.10     ENDOCRINE CBG (last 3)   Recent Labs  10/09/15 2331 10/10/15 0431 10/10/15 0759  GLUCAP 111* 123* 126*         IMAGING x48h  - image(s) personally visualized  -   highlighted in bold Ct Head Wo Contrast  10/10/2015  CLINICAL DATA:  Follow-up evaluation.  Thalamus hemorrhage. EXAM: CT HEAD WITHOUT CONTRAST TECHNIQUE: Contiguous axial images were obtained from the base of the skull through the vertex without intravenous contrast. COMPARISON:  CT HEAD October 09, 2015 FINDINGS: INTRACRANIAL CONTENTS: RIGHT thalamus  hematoma was 3.7 x 2.8 cm, now 4 x 3.3 cm with worsening mass-effect, surrounding low-density vasogenic edema. Again noted is intraventricular extension of hemorrhage. RIGHT frontal ventriculostomy catheter in place with distal tip at the foramen of Monro. Scratch Mild hydrocephalus, improved from prior CT without residual ventricular entrapment. Patchy white matter hypodensities exclusive of the aforementioned abnormality most compatible with chronic small vessel ischemic disease. Mildly effaced basal cistern. No acute large vascular territory infarct. ORBITS: The included ocular globes and orbital contents are normal. SINUSES: The mastoid aircells and included paranasal sinuses are well-aerated. SKULL/SOFT TISSUES: No skull fracture. No significant soft tissue swelling. RIGHT nasogastric tube. IMPRESSION: Enlarging RIGHT thalamus hematoma with increasing local mass effect. Resolving LEFT ventricular entrapment with RIGHT frontal ventriculostomy catheter in place. Mild residual hydrocephalus. These results will be called to the ordering clinician or representative by the Radiologist Assistant, and communication documented  in the PACS or zVision Dashboard. Electronically Signed   By: Elon Alas M.D.   On: 10/10/2015 05:21   Ct Head Wo Contrast  10/09/2015  CLINICAL DATA:  Altered mental status 09/29/2015, fall on 09/28/2015, hypertension, abnormal neurologic exam today at 1400 hours, history coronary disease post MI and coronary PTCA, smoker EXAM: CT HEAD WITHOUT CONTRAST TECHNIQUE: Contiguous axial images were obtained from the base of the skull through the vertex without intravenous contrast. COMPARISON:  None. FINDINGS: Intraventricular shunt via RIGHT frontal approach with tip at the LEFT foramina [place]. Decompressed RIGHT lateral ventricle with increase in dilatation of the LEFT lateral ventricle since the previous exam. Again identified RIGHT thalamic hemorrhage measuring 3.7 x 2.8 x 2.5 cm,  previously 2.8 x 2.4 x 1.6 cm with slightly greater mass effect on the midline. Increased intraventricular hemorrhage since previous exam within lateral and third ventricles. No new areas of hemorrhage, infarction, or mass. No extra-axial fluid collection. Bones and sinuses unremarkable. IMPRESSION: Increase in size of RIGHT thalamic hemorrhage since previous study with slightly increased intraventricular hemorrhage and mass effect upon the midline at the level of the thalamus. Findings called to [facility] on [place] on 10/09/2015 at 1543 hours. Electronically Signed   By: Lavonia Dana M.D.   On: 10/09/2015 15:41   Dg Chest Port 1 View  10/10/2015  CLINICAL DATA:  Respiratory failure. EXAM: PORTABLE CHEST 1 VIEW COMPARISON:  10/09/2015 . FINDINGS: Endotracheal tube and feeding tube in stable position. Mediastinum hilar structures normal. Low lung volumes with mild bibasilar atelectasis and/or infiltrates again noted. Tiny bilateral pleural effusions cannot be excluded. No pneumothorax. IMPRESSION: 1. Lines and tubes in stable position. 2. Mild bibasilar atelectasis and/or infiltrates. Small bilateral pleural effusions . Electronically Signed   By: Marcello Moores  Register   On: 10/10/2015 07:30   Dg Chest Port 1 View  10/09/2015  CLINICAL DATA:  Intubated EXAM: PORTABLE CHEST 1 VIEW COMPARISON:  10/07/2015 FINDINGS: Cardiomediastinal silhouette is stable. Endotracheal tube with tip 1.2 cm above the carina. No infiltrate or pulmonary edema. There is NG feeding tube coiled within stomach with tip in pyloric region. No pneumothorax IMPRESSION: Endotracheal tube with tip 1.2 cm above the carina. No pneumothorax. NG feeding tube coiled within stomach with tip in distal stomach/pyloric region. Electronically Signed   By: Lahoma Crocker M.D.   On: 10/09/2015 17:33   Dg Abd Portable 1v  10/08/2015  CLINICAL DATA:  Nasogastric tube placement.  Initial encounter. EXAM: PORTABLE ABDOMEN - 1 VIEW COMPARISON:  None. FINDINGS: The  patient's enteric tube is noted coiling within the stomach, ending overlying the pylorus. This will likely progress further with time. The visualized bowel gas pattern is unremarkable. Scattered air and stool filled loops of colon are seen; no abnormal dilatation of small bowel loops is seen to suggest small bowel obstruction. No free intra-abdominal air is identified, though evaluation for free air is limited on a single supine view. The visualized osseous structures are within normal limits; the sacroiliac joints are unremarkable in appearance. Mild left basilar opacity could reflect pneumonia, depending on the patient's symptoms. IMPRESSION: 1. Enteric tube noted coiling within the stomach, ending overlying the pylorus. This will likely progress further with time. 2. Mild left basilar airspace opacity could reflect pneumonia, depending on the patient's symptoms. 3. Unremarkable bowel gas pattern; no free intra-abdominal air seen. Electronically Signed   By: Garald Balding M.D.   On: 10/08/2015 19:16         DISCUSSION: 60 year old  female past medical history of coronary artery disease, ischemic cardiomyopathy, and polysubstance abuse being admitted with right thalamic intracranial hemorrhage. She is intubated for airway protection and was transferred to [facility] for ICU admission. Will maintain on ventilator with tight blood pressure control with Cardene infusion. Neurology and neurosurgery consulted.  IVC drain placed early am 6/16.    ASSESSMENT / PLAN:  PULMONARY A: Acute Hypoxemic resp failure 2/2 Unable to Protect Airway - Secondary to [place]. CXR 6/21 Possible LLL infiltrate, possible HCAP.    - does not meet sbt criteria  P:   Tolerating extubation, snoring resps but no notable secretions and appears to protect airway Wean O2 for sats > 90%. abx for probable LLL PNA as below  CARDIOVASCULAR A:  Chronic Systolic CHF - EF [id number] w/ grade 1 diastolic dysfunction. RV normal. H/O  Hypertension H/O CAD - S/P PCI in 2015 H/O Hyperlipidemia   - not on pressors or cleviprex  P:  Tight BP control with SBP goal < 150mmHg Continue clevidipine if needed per NSGY Continue carvedilol and increase 6/25, HCTZ, losartan per tube   RENAL A:   Metabolic Acidosis - resolved Hypokalemia  P:   Monitor renal function and UOP Strict I&O Correct electrolytes as indicated   GASTROINTESTINAL A:   H/O GERD P:   Restart TF  Pepcid IV  HEMATOLOGIC A:   No acute issues. P:  Follow CBC SCDs No chemical DVT ppx given ICH  INFECTIOUS A:   Persistent fever. CXR with possible LLL infiltrate (possible LLL HCAP) H/O HSV Infection P:   Pip/tazo day 6 of 7 on 6/25  ENDOCRINE A:   No acute issues P:   CBG's Q 4 hours SSI Follow glucose on BMP  NEUROLOGIC A:   Right Thalamic Intracranial Hemorrhage s/p IVC 6/16 Cocaine Abuse - urine drug screen positive for cocaine on admission H/O EtOH Abuse H/O Depression Following commands on the right side only.  - deeply comatose, RASS -5 equivalent. OFf sedation  P:  Sedation: off RASS goal: 0 but not achivevable prognostically Neurosurgery assistance appreciated, drain management as per their recs.  Blood pressure control as above.  FAMILY  - Updates:  Boyfriend updated at bedside 6/25  . Brother and sisnter updated 10/10/2015  NSGY plans to talk to family 10/10/2015     The patient is critically ill with multiple organ systems failure and requires high complexity decision making for assessment and support, frequent evaluation and titration of therapies, application of advanced monitoring technologies and extensive interpretation of multiple databases.   Critical Care Time devoted to patient care services described in this note is  30  Minutes. This time reflects time of care of this signee Dr Brand Males. This critical care time does not reflect procedure time, or teaching time or supervisory time of  PA/NP/Med student/Med Resident etc but could involve care discussion time    Dr. Brand Males, M.D., [facility].C.P Pulmonary and Critical Care Medicine Staff Physician [facility] Pager: [phone], If no answer or between  15:00h - 7:00h: call [phone]  10/10/2015 11:25 AM

## 2015-10-10 NOTE — Progress Notes (Signed)
Pt hypotensive. Clevidipine has been since 20:15, fentanyl gtt off at 2300 on 6/25. Neurosurgery MD notified. Discussed labs and condition. Will continue to monitor.

## 2015-10-10 NOTE — Progress Notes (Signed)
Pt coughing on vent. No sedation ordered. Elink MD notified. PRN fentanyl order. Medication administered. Will continue to monitor.

## 2015-10-11 ENCOUNTER — Inpatient Hospital Stay (HOSPITAL_COMMUNITY): Payer: Medicaid Other

## 2015-10-11 LAB — BASIC METABOLIC PANEL
ANION GAP: 11 (ref 5–15)
BUN: 22 mg/dL — ABNORMAL HIGH (ref 6–20)
CALCIUM: 9.6 mg/dL (ref 8.9–10.3)
CHLORIDE: 101 mmol/L (ref 101–111)
CO2: 23 mmol/L (ref 22–32)
Creatinine, Ser: 0.75 mg/dL (ref 0.44–1.00)
GFR calc non Af Amer: >60 mL/min (ref >60)
Glucose, Bld: 145 mg/dL — ABNORMAL HIGH (ref 65–99)
Potassium: 3.3 mmol/L — ABNORMAL LOW (ref 3.5–5.1)
Sodium: 135 mmol/L (ref 135–145)

## 2015-10-11 LAB — CBC WITH DIFFERENTIAL/PLATELET
Basophils Absolute: 0 10*3/uL (ref 0.0–0.1)
Basophils Relative: 0 %
EOS ABS: 0.1 10*3/uL (ref 0.0–0.7)
EOS PCT: 1 %
HCT: 29 % — ABNORMAL LOW (ref 36.0–46.0)
Hemoglobin: 9.4 g/dL — ABNORMAL LOW (ref 12.0–15.0)
LYMPHS ABS: 1.3 10*3/uL (ref 0.7–4.0)
Lymphocytes Relative: 13 %
MCH: 30.7 pg (ref 26.0–34.0)
MCHC: 32.4 g/dL (ref 30.0–36.0)
MCV: 94.8 fL (ref 78.0–100.0)
MONOS PCT: 9 %
Monocytes Absolute: 0.9 10*3/uL (ref 0.1–1.0)
Neutro Abs: 7.4 10*3/uL (ref 1.7–7.7)
Neutrophils Relative %: 77 %
Platelets: 354 10*3/uL (ref 150–400)
RBC: 3.06 MIL/uL — ABNORMAL LOW (ref 3.87–5.11)
RDW: 13.3 % (ref 11.5–15.5)
WBC: 9.7 10*3/uL (ref 4.0–10.5)

## 2015-10-11 LAB — PHOSPHORUS: Phosphorus: 3.5 mg/dL (ref 2.5–4.6)

## 2015-10-11 LAB — GLUCOSE, CAPILLARY
GLUCOSE-CAPILLARY: 127 mg/dL — AB (ref 65–99)
GLUCOSE-CAPILLARY: 134 mg/dL — AB (ref 65–99)
GLUCOSE-CAPILLARY: 142 mg/dL — AB (ref 65–99)
Glucose-Capillary: 156 mg/dL — ABNORMAL HIGH (ref 65–99)
Glucose-Capillary: 122 mg/dL — ABNORMAL HIGH (ref 65–99)
Glucose-Capillary: 125 mg/dL — ABNORMAL HIGH (ref 65–99)

## 2015-10-11 LAB — MAGNESIUM: Magnesium: 2.4 mg/dL (ref 1.7–2.4)

## 2015-10-11 MED ORDER — POTASSIUM CHLORIDE 20 MEQ/15ML (10%) PO SOLN
40.0000 meq | Freq: Once | ORAL | Status: AC
  Administered 2015-10-11: 40 meq
  Filled 2015-10-11: qty 30

## 2015-10-11 NOTE — Progress Notes (Signed)
[facility] ADULT ICU REPLACEMENT PROTOCOL FOR AM LAB REPLACEMENT ONLY  The patient does not apply for the [facility] Adult ICU Electrolyte Replacment Protocol based on the criteria listed below:   1. Is GFR >/= 40 ml/min? Yes.    Patient's GFR today is 45 2. Is urine output >/= 0.5 ml/kg/hr for the last 6 hours? No. Patient's UOP is NONE RECORDED ml/kg/hr 3. Is BUN < 60 mg/dL? Yes.    Patient's BUN today is 22 4. Abnormal electrolyte(s): 3.3 5. Ordered repletion with: NA 6. If a panic level lab has been reported, has the CCM MD in charge been notified? No..   Physician:  Arlester Marker, Eddie Dibbles [place]illiard 10/11/2015 5:16 AM

## 2015-10-11 NOTE — Progress Notes (Signed)
PULMONARY / CRITICAL CARE MEDICINE   Name: MARKAILA PECHA MRN: [id number] DOB: 07-11-1955    ADMISSION DATE:  09/29/2015 CONSULTATION DATE:  09/29/2015  REFERRING MD:  Ezequiel Essex, M.D. / AP EDP  CHIEF COMPLAINT:  Altered mental status  BRIEF   60 year old female past medical history as below, which is significant for hypertension, alcohol abuse, cocaine abuse, HSV, coronary artery disease status post stents to RCA and LAD in 2015, and ischemic cardiomyopathy with LV EF 35-40%. She presented to [facility] emergency department 6/15 with altered mental status. Reportedly the night before she suffered a fall during the night, and since that time has been sleeping all day. She was brought to the emergency department with this complaint. In emergency department she was unresponsive but reportedly breathing adequately. She had no spontaneous movement and left gaze deviation, but did have a positive gag reflex. CT scan of the head was obtained and demonstrated acute intracranial hemorrhage with intraventricular extension. Cervical spine was without radiographic evidence of injury. She was intubated for airway protection with anticipation of transfer to [facility]. PCCM accepting.    STUDIES:  CT Head W/O 6/15: Acute intracranial hemorrhage with intraventricular extension and possible developing hydrocephalus Although no fractures identified in the cervical spine, the evaluation of the cervical spine is limited. CT Neck 6/15: Limited evaluation of cervical spine due to motion. No fractures evident. Port CXR 6/15: Silhouetting left hemidiaphragm. Endotracheal tube tip at the level of the carina to nearly entering right mainstem bronchus. Orogastric tube passing below diaphragm. Port CXR 6/16>>ETT 4.5 cm above carina. Atelectasis in lung bases CT head 6/19 > no significant change since compared to prior scan. CT Head  10/04/2015 >> Degenerating RIGHT thalamus hematoma with local mass effect  and edema. Similar mild hydrocephalus, stable RIGHT frontal ventriculostomy catheter. Degenerating intraventricular blood products.  MICROBIOLOGY: MRSA PCR 6/15 >> (-) Blood Ctx x2 6/15 >> (-) Blood 6/20 > (-) Sputum 6/20 > (-) Urine 6/20 > (-)  ANTIBIOTICS: 6/15 Rocephin >>6/18 6/16 Acef (prophylactic abx) >> 6/18 Zosyn 6/20 >  Vanc 6/20 > 6/22    LINES/TUBES: OETT 7.5 6/15 >> 6/24 Foley 6/15 >> OGT 6/15 >> 6/24 PIV X3 IVC Drain 6/16 >>    SIGNIFICANT EVENTS: 6/15 - Admit to ICU on ventilator for ICH after transfer from [place] ED 6/25 Extubated successfully - reintuated    SUBJECTIVE/OVERNIGHT/INTERVAL HX 6/26 - reintubated yesteda. CT today shows worsening rt thalamus hematoma with local mass effect. NSGY aware . He is less responsive today. WorseningCreat. Not on cleviprex. Not on pressors. Brother Legrand Como and sister at bedside. Per RN - very poor responsive state and son might be open to terminal wean depending on NSGY conversaton  VITAL SIGNS: BP 146/55 mmHg  Pulse 73  Temp(Src) 99.6 F (37.6 C) (Axillary)  Resp 16  Ht 5\' 3"  (1.6 m)  Wt 107 lb 5.8 oz (48.7 kg)  BMI 19.02 kg/m2  SpO2 100%  HEMODYNAMICS:    VENTILATOR SETTINGS: Vent Mode:  [-] PRVC FiO2 (%):  [30 %] 30 % Set Rate:  [16 bmp] 16 bmp Vt Set:  [420 mL] 420 mL PEEP:  [5 cmH20] 5 cmH20 Plateau Pressure:  [14 cmH20-16 cmH20] 14 cmH20  INTAKE / OUTPUT: I/O last 3 completed shifts: In: 3685.8 [I.V.:1455.8; NG/GT:1680; IV Piggyback:550] Out: 33 [Urine:216; Drains:322; Stool:352]  PHYSICAL EXAMINATION: General:  Ill appearing IVC drain.  Integument:  Warm & dry. No rash on exposed skin.  HEENT:  Drain in place. No  scleral injection or icterus. no significant oral secretions.  Cardiovascular:  RRR, no M/R/G. Pulmonary:  Coarse bilaterally.   Abdomen: Soft. Normal bowel sounds. Nondistended.  Musculoskeletal:  Normal bulk. No joint deformity or effusion appreciated. Neurological: RASS -5  equivalent without sedation LABS: PULMONARY  Recent Labs Lab 10/09/15 1808  PHART 7.435  PCO2ART 39.0  PO2ART 163.0*  HCO3 26.2*  TCO2 27  O2SAT 100.0    CBC  Recent Labs Lab 10/07/15 0344 10/08/15 0529 10/11/15 0331  HGB 9.4* 9.2* 9.4*  HCT 29.5* 28.2* 29.0*  WBC 6.7 7.5 9.7  PLT 289 283 354    COAGULATION No results for input(s): INR in the last 168 hours.  CARDIAC  No results for input(s): TROPONINI in the last 168 hours. No results for input(s): PROBNP in the last 168 hours.   CHEMISTRY  Recent Labs Lab 10/07/15 0344 10/08/15 0529 10/09/15 0324 10/10/15 0418 10/11/15 0331  NA 136 137 132* 135 135  K 3.5 3.2* 3.2* 4.3 3.3*  CL 103 101 98* 102 101  CO2 26 25 23 22 23   GLUCOSE 132* 132* 140* 122* 145*  BUN 15 18 16  33* 22*  CREATININE 0.69 0.68 0.72 1.44* 0.75  CALCIUM 9.4 9.4 9.2 9.4 9.6  MG  --   --  2.2 2.2 2.4  PHOS  --   --   --   --  3.5   Estimated Creatinine Clearance: 57.5 mL/min (by C-G formula based on Cr of 0.75).   LIVER No results for input(s): AST, ALT, ALKPHOS, BILITOT, PROT, ALBUMIN, INR in the last 168 hours.   INFECTIOUS  Recent Labs Lab 10/05/15 1031 10/06/15 0426 10/07/15 0344  PROCALCITON <0.10 0.10 <0.10     ENDOCRINE CBG (last 3)   Recent Labs  10/11/15 0440 10/11/15 0806 10/11/15 1111  GLUCAP 142* 127* 156*         IMAGING x48h  - image(s) personally visualized  -   highlighted in bold Ct Head Wo Contrast  10/10/2015  CLINICAL DATA:  Follow-up evaluation.  Thalamus hemorrhage. EXAM: CT HEAD WITHOUT CONTRAST TECHNIQUE: Contiguous axial images were obtained from the base of the skull through the vertex without intravenous contrast. COMPARISON:  CT HEAD October 09, 2015 FINDINGS: INTRACRANIAL CONTENTS: RIGHT thalamus hematoma was 3.7 x 2.8 cm, now 4 x 3.3 cm with worsening mass-effect, surrounding low-density vasogenic edema. Again noted is intraventricular extension of hemorrhage. RIGHT frontal  ventriculostomy catheter in place with distal tip at the foramen of [place]. Scratch Mild hydrocephalus, improved from prior CT without residual ventricular entrapment. Patchy white matter hypodensities exclusive of the aforementioned abnormality most compatible with chronic small vessel ischemic disease. Mildly effaced basal cistern. No acute large vascular territory infarct. ORBITS: The included ocular globes and orbital contents are normal. SINUSES: The mastoid aircells and included paranasal sinuses are well-aerated. SKULL/SOFT TISSUES: No skull fracture. No significant soft tissue swelling. RIGHT nasogastric tube. IMPRESSION: Enlarging RIGHT thalamus hematoma with increasing local mass effect. Resolving LEFT ventricular entrapment with RIGHT frontal ventriculostomy catheter in place. Mild residual hydrocephalus. These results will be called to the ordering clinician or representative by the Radiologist Assistant, and communication documented in the PACS or zVision Dashboard. Electronically Signed   By: Elon Alas M.D.   On: 10/10/2015 05:21   Ct Head Wo Contrast  10/09/2015  CLINICAL DATA:  Altered mental status 09/29/2015, fall on 09/28/2015, hypertension, abnormal neurologic exam today at 1400 hours, history coronary disease post MI and coronary PTCA, smoker EXAM: CT HEAD  WITHOUT CONTRAST TECHNIQUE: Contiguous axial images were obtained from the base of the skull through the vertex without intravenous contrast. COMPARISON:  None. FINDINGS: Intraventricular shunt via RIGHT frontal approach with tip at the LEFT foramina [place]. Decompressed RIGHT lateral ventricle with increase in dilatation of the LEFT lateral ventricle since the previous exam. Again identified RIGHT thalamic hemorrhage measuring 3.7 x 2.8 x 2.5 cm, previously 2.8 x 2.4 x 1.6 cm with slightly greater mass effect on the midline. Increased intraventricular hemorrhage since previous exam within lateral and third ventricles. No new areas  of hemorrhage, infarction, or mass. No extra-axial fluid collection. Bones and sinuses unremarkable. IMPRESSION: Increase in size of RIGHT thalamic hemorrhage since previous study with slightly increased intraventricular hemorrhage and mass effect upon the midline at the level of the thalamus. Findings called to [facility] on [place] on 10/09/2015 at 1543 hours. Electronically Signed   By: Lavonia Dana M.D.   On: 10/09/2015 15:41   Dg Chest Port 1 View  10/11/2015  CLINICAL DATA:  Endotracheal tube EXAM: PORTABLE CHEST 1 VIEW COMPARISON:  Yesterday FINDINGS: Feeding tube at least reaches the stomach, with coil seen in the fundus. Low endotracheal tube with tip 17 mm above the carina. At least segmental atelectasis at the right base. Borderline cardiomegaly, stable. No edema, effusion, or pneumothorax. IMPRESSION: 1. Low endotracheal tube, tip 17 mm above the carina. 2. Right basilar atelectasis similar to prior. Electronically Signed   By: Monte Fantasia M.D.   On: 10/11/2015 07:37   Dg Chest Port 1 View  10/10/2015  CLINICAL DATA:  Respiratory failure. EXAM: PORTABLE CHEST 1 VIEW COMPARISON:  10/09/2015 . FINDINGS: Endotracheal tube and feeding tube in stable position. Mediastinum hilar structures normal. Low lung volumes with mild bibasilar atelectasis and/or infiltrates again noted. Tiny bilateral pleural effusions cannot be excluded. No pneumothorax. IMPRESSION: 1. Lines and tubes in stable position. 2. Mild bibasilar atelectasis and/or infiltrates. Small bilateral pleural effusions . Electronically Signed   By: Marcello Moores  Register   On: 10/10/2015 07:30   Dg Chest Port 1 View  10/09/2015  CLINICAL DATA:  Intubated EXAM: PORTABLE CHEST 1 VIEW COMPARISON:  10/07/2015 FINDINGS: Cardiomediastinal silhouette is stable. Endotracheal tube with tip 1.2 cm above the carina. No infiltrate or pulmonary edema. There is NG feeding tube coiled within stomach with tip in pyloric region. No pneumothorax IMPRESSION:  Endotracheal tube with tip 1.2 cm above the carina. No pneumothorax. NG feeding tube coiled within stomach with tip in distal stomach/pyloric region. Electronically Signed   By: Lahoma Crocker M.D.   On: 10/09/2015 17:33         DISCUSSION: 60 year old female past medical history of coronary artery disease, ischemic cardiomyopathy, and polysubstance abuse being admitted with right thalamic intracranial hemorrhage. She is intubated for airway protection and was transferred to [facility] for ICU admission. Will maintain on ventilator with tight blood pressure control with Cardene infusion. Neurology and neurosurgery consulted.  IVC drain placed early am 6/16.  Poor prognosis.   ASSESSMENT / PLAN:  PULMONARY A: Acute Hypoxemic resp failure 2/2 Unable to Protect Airway - Secondary to [place]. CXR 6/21 Possible LLL infiltrate, possible HCAP.    - does not meet sbt criteria  P:   Tolerating extubation, snoring resps but no notable secretions and appears to protect airway Wean O2 for sats > 90%. abx for probable LLL PNA as below  CARDIOVASCULAR A:  Chronic Systolic CHF - EF 123456 w/ grade 1 diastolic dysfunction. RV normal. H/O Hypertension  H/O CAD - S/P PCI in 2015 H/O Hyperlipidemia   - not on pressors or cleviprex  P:  Tight BP control with SBP goal < 172mmHg Continue clevidipine if needed per NSGY Continue carvedilol and increase 6/25, HCTZ, losartan per tube   RENAL A:   Metabolic Acidosis - resolved Hypokalemia  P:   Monitor renal function and UOP Strict I&O Correct electrolytes as indicated   GASTROINTESTINAL A:   H/O GERD P:   Restart TF  Pepcid IV  HEMATOLOGIC A:   No acute issues. P:  Follow CBC SCDs No chemical DVT ppx given ICH  INFECTIOUS A:   Persistent fever. CXR with possible LLL infiltrate (possible LLL HCAP) H/O HSV Infection P:   Pip/tazo day 7 of 7 on 6/26  ENDOCRINE A:   No acute issues P:   CBG's Q 4 hours SSI Follow glucose on  BMP  NEUROLOGIC A:   Right Thalamic Intracranial Hemorrhage s/p IVC 6/16 Cocaine Abuse - urine drug screen positive for cocaine on admission H/O EtOH Abuse H/O Depression Following commands on the right side only.  - deeply comatose, RASS -5 equivalent. OFf sedation  P:  Sedation: off RASS goal: 0 but not achivevable prognostically Neurosurgery assistance appreciated, drain management as per their recs.  Blood pressure control as above. Poor prognosis   FAMILY  - Updates:  Boyfriend updated at bedside 6/25  . Brother and sisnter updated  NSGY plans to talk to family  No one at bedside 6/27   [facility] Valerie Cones ACNP Maryanna Shape PCCM Pager [phone] till 3 pm If no answer page [phone] 10/11/2015, 12:47 PM

## 2015-10-11 NOTE — Progress Notes (Signed)
[facility] Progress Note Patient Name: Joanna Farmer DOB: August 13, 1955 MRN: [id number]   Date of Service  10/11/2015  HPI/Events of Note  K+ = 3.3 and Creatinine = 0.75.  eICU Interventions  Will replete K+.     Intervention Category Intermediate Interventions: Electrolyte abnormality - evaluation and management  Sommer,Steven Eugene 10/11/2015, 5:32 AM

## 2015-10-11 NOTE — Progress Notes (Signed)
Speech Language Pathology Discharge Patient Details Name: Joanna Farmer MRN: [id number] DOB: [birth year] Today's Date: 10/11/2015 Time:  -     Patient discharged from SLP services secondary to continues to be intubated with poor prognosis per notes.  Please see latest therapy progress note for current level of functioning and progress toward goals.    Progress and discharge plan discussed with patient and/or caregiver: Patient unable to participate in discharge planning and no caregivers available  GO     Joanna Farmer 10/11/2015, 2:31 PM

## 2015-10-11 NOTE — Progress Notes (Signed)
No acute events Neuro unchanged Drain patent Poor prognosis: discussed this with son, he understands.  Will discuss with daughter when she arrives.

## 2015-10-11 NOTE — Progress Notes (Signed)
Nutrition Follow-up  DOCUMENTATION CODES:   Not applicable  INTERVENTION:   Continue: Vital AF 1.2 @ 45 ml/hr Provides: 1296 kcal (102% of needs), 81 grams protein, and 875 ml H2O.    NUTRITION DIAGNOSIS:   Inadequate oral intake related to inability to eat as evidenced by NPO status. Ongoing.   GOAL:   Patient will meet greater than or equal to 90% of their needs Met.   MONITOR:   TF tolerance, Vent status, Labs  ASSESSMENT:   Pt with past medical history of coronary artery disease, ischemic cardiomyopathy, and polysubstance abuse being admitted with right thalamic intracranial hemorrhage, right frontal external ventricular drain placed 6/16.   Patient is currently intubated on ventilator support MV: 6.9 L/min Temp (24hrs), Avg:99.6 F (37.6 C), Min:98.2 F (36.8 C), Max:102.2 F (39 C)  Propofol: off Medications reviewed and include: MVI Labs reviewed: CBG's: 122-156 Poor prognosis per neurosurgery to discuss with family.   Diet Order:  Diet NPO time specified  Skin:  Reviewed, no issues  Last BM:  6/26 300 ml rectal pouch  Height:   Ht Readings from Last 1 Encounters:  09/29/15 '5\' 3"'$  (1.6 m)    Weight:   Wt Readings from Last 1 Encounters:  10/11/15 107 lb 5.8 oz (48.7 kg)    Ideal Body Weight:  52.2 kg  BMI:  Body mass index is 19.02 kg/(m^2).  Estimated Nutritional Needs:   Kcal:  1270  Protein:  70-85 grams  Fluid:  > 1.5 L/day  EDUCATION NEEDS:   No education needs identified at this time  [place], [place], [place] Pager [phone] After Hours Pager

## 2015-10-12 LAB — BASIC METABOLIC PANEL
Anion gap: 12 (ref 5–15)
BUN: 25 mg/dL — AB (ref 6–20)
CHLORIDE: 105 mmol/L (ref 101–111)
CO2: 23 mmol/L (ref 22–32)
Calcium: 9.3 mg/dL (ref 8.9–10.3)
Creatinine, Ser: 0.9 mg/dL (ref 0.44–1.00)
GFR calc Af Amer: >60 mL/min (ref >60)
GFR calc non Af Amer: >60 mL/min (ref >60)
GLUCOSE: 115 mg/dL — AB (ref 65–99)
POTASSIUM: 3.9 mmol/L (ref 3.5–5.1)
Sodium: 140 mmol/L (ref 135–145)

## 2015-10-12 LAB — CBC WITH DIFFERENTIAL/PLATELET
Basophils Absolute: 0 10*3/uL (ref 0.0–0.1)
Basophils Relative: 1 %
EOS PCT: 3 %
Eosinophils Absolute: 0.2 10*3/uL (ref 0.0–0.7)
HEMATOCRIT: 27.6 % — AB (ref 36.0–46.0)
Hemoglobin: 9.4 g/dL — ABNORMAL LOW (ref 12.0–15.0)
LYMPHS ABS: 1.8 10*3/uL (ref 0.7–4.0)
LYMPHS PCT: 26 %
MCH: 33.1 pg (ref 26.0–34.0)
MCHC: 34.1 g/dL (ref 30.0–36.0)
MCV: 97.2 fL (ref 78.0–100.0)
Monocytes Absolute: 0.9 10*3/uL (ref 0.1–1.0)
Monocytes Relative: 12 %
NEUTROS ABS: 4.2 10*3/uL (ref 1.7–7.7)
Neutrophils Relative %: 58 %
Platelets: 346 10*3/uL (ref 150–400)
RBC: 2.84 MIL/uL — ABNORMAL LOW (ref 3.87–5.11)
RDW: 13.7 % (ref 11.5–15.5)
WBC: 7.5 10*3/uL (ref 4.0–10.5)

## 2015-10-12 LAB — GLUCOSE, CAPILLARY
GLUCOSE-CAPILLARY: 113 mg/dL — AB (ref 65–99)
GLUCOSE-CAPILLARY: 114 mg/dL — AB (ref 65–99)
GLUCOSE-CAPILLARY: 127 mg/dL — AB (ref 65–99)
Glucose-Capillary: 116 mg/dL — ABNORMAL HIGH (ref 65–99)
Glucose-Capillary: 128 mg/dL — ABNORMAL HIGH (ref 65–99)

## 2015-10-12 LAB — MAGNESIUM: Magnesium: 2.4 mg/dL (ref 1.7–2.4)

## 2015-10-12 LAB — PHOSPHORUS: Phosphorus: 3.2 mg/dL (ref 2.5–4.6)

## 2015-10-12 NOTE — Progress Notes (Signed)
Pt ICP > 25 beyond 10 minute threshold.  Paged Dr. Cyndy Freeze.  Instructed to place IVC at 5 cmH2O and re-open drain.

## 2015-10-12 NOTE — Progress Notes (Signed)
PULMONARY / CRITICAL CARE MEDICINE   Name: KIP MCINERNY MRN: [id number] DOB: 20-Mar-1956    ADMISSION DATE:  09/29/2015 CONSULTATION DATE:  09/29/2015  REFERRING MD:  Ezequiel Essex, M.D. / AP EDP  CHIEF COMPLAINT:  Altered mental status  BRIEF   60 year old female past medical history as below, which is significant for hypertension, alcohol abuse, cocaine abuse, HSV, coronary artery disease status post stents to RCA and LAD in 2015, and ischemic cardiomyopathy with LV EF 35-40%. She presented to [facility] emergency department 6/15 with altered mental status. Reportedly the night before she suffered a fall during the night, and since that time has been sleeping all day. She was brought to the emergency department with this complaint. In emergency department she was unresponsive but reportedly breathing adequately. She had no spontaneous movement and left gaze deviation, but did have a positive gag reflex. CT scan of the head was obtained and demonstrated acute intracranial hemorrhage with intraventricular extension. Cervical spine was without radiographic evidence of injury. She was intubated for airway protection with anticipation of transfer to [facility]. PCCM accepting.    STUDIES:  CT Head W/O 6/15: Acute intracranial hemorrhage with intraventricular extension and possible developing hydrocephalus Although no fractures identified in the cervical spine, the evaluation of the cervical spine is limited. CT Neck 6/15: Limited evaluation of cervical spine due to motion. No fractures evident. Port CXR 6/15: Silhouetting left hemidiaphragm. Endotracheal tube tip at the level of the carina to nearly entering right mainstem bronchus. Orogastric tube passing below diaphragm. Port CXR 6/16>>ETT 4.5 cm above carina. Atelectasis in lung bases CT head 6/19 > no significant change since compared to prior scan. CT Head  10/04/2015 >> Degenerating RIGHT thalamus hematoma with local mass effect  and edema. Similar mild hydrocephalus, stable RIGHT frontal ventriculostomy catheter. Degenerating intraventricular blood products.  MICROBIOLOGY: MRSA PCR 6/15 >> (-) Blood Ctx x2 6/15 >> (-) Blood 6/20 > (-) Sputum 6/20 > (-) Urine 6/20 > (-)  ANTIBIOTICS: 6/15 Rocephin >>6/18 6/16 Acef (prophylactic abx) >> 6/18 Zosyn 6/20 >  Vanc 6/20 > 6/22    LINES/TUBES: OETT 7.5 6/15 >> 6/24 replaced 6/24>> Foley 6/15 >> OGT 6/15 >> 6/24 PIV X3 IVC Drain 6/16 >>    SIGNIFICANT EVENTS: 6/15 - Admit to ICU on ventilator for ICH after transfer from [place] ED 6/25 Extubated successfully - re intubated     SUBJECTIVE/OVERNIGHT/INTERVAL HX 6/26 - reintubated yesteda. CT today shows worsening rt thalamus hematoma with local mass effect. NSGY aware . He is less responsive today. WorseningCreat. Not on cleviprex. Not on pressors. Brother Legrand Como and sister at bedside. Per RN - very poor responsive state and son might be open to terminal wean depending on NSGY conversaton  VITAL SIGNS: BP 155/53 mmHg  Pulse 54  Temp(Src) 98.6 F (37 C) (Axillary)  Resp 16  Ht 5\' 3"  (1.6 m)  Wt 104 lb 0.9 oz (47.2 kg)  BMI 18.44 kg/m2  SpO2 100%  HEMODYNAMICS:    VENTILATOR SETTINGS: Vent Mode:  [-] PRVC FiO2 (%):  [30 %] 30 % Set Rate:  [16 bmp] 16 bmp Vt Set:  [420 mL] 420 mL PEEP:  [5 cmH20] 5 cmH20 Plateau Pressure:  [13 cmH20-17 cmH20] 17 cmH20  INTAKE / OUTPUT: I/O last 3 completed shifts: In: [id number] [I.V.:1480; NG/GT:1920; IV Piggyback:250] Out: [id number] [Urine:1; Drains:303; Stool:300]  PHYSICAL EXAMINATION: General:  Ill appearing IVC drain.  Integument:  Warm & dry. No rash on exposed skin.  HEENT:  Drain in place. No scleral injection or icterus. no significant oral secretions.  Cardiovascular:  RRR, no M/R/G. Pulmonary:  Coarse bilaterally.   Abdomen: Soft. Normal bowel sounds. Nondistended.  Musculoskeletal:  Normal bulk. No joint deformity or effusion appreciated. Neurological:  RASS -5 equivalent without sedation LABS: PULMONARY  Recent Labs Lab 10/09/15 1808  PHART 7.435  PCO2ART 39.0  PO2ART 163.0*  HCO3 26.2*  TCO2 27  O2SAT 100.0    CBC  Recent Labs Lab 10/08/15 0529 10/11/15 0331 10/12/15 0500  HGB 9.2* 9.4* 9.4*  HCT 28.2* 29.0* 27.6*  WBC 7.5 9.7 7.5  PLT 283 354 346    COAGULATION No results for input(s): INR in the last 168 hours.  CARDIAC  No results for input(s): TROPONINI in the last 168 hours. No results for input(s): PROBNP in the last 168 hours.   CHEMISTRY  Recent Labs Lab 10/08/15 0529 10/09/15 0324 10/10/15 0418 10/11/15 0331 10/12/15 0500  NA 137 132* 135 135 140  K 3.2* 3.2* 4.3 3.3* 3.9  CL 101 98* 102 101 105  CO2 25 23 22 23 23   GLUCOSE 132* 140* 122* 145* 115*  BUN 18 16 33* 22* 25*  CREATININE 0.68 0.72 1.44* 0.75 0.90  CALCIUM 9.4 9.2 9.4 9.6 9.3  MG  --  2.2 2.2 2.4 2.4  PHOS  --   --   --  3.5 3.2   Estimated Creatinine Clearance: 49.5 mL/min (by C-G formula based on Cr of 0.9).   LIVER No results for input(s): AST, ALT, ALKPHOS, BILITOT, PROT, ALBUMIN, INR in the last 168 hours.   INFECTIOUS  Recent Labs Lab 10/05/15 1031 10/06/15 0426 10/07/15 0344  PROCALCITON <0.10 0.10 <0.10     ENDOCRINE CBG (last 3)   Recent Labs  10/11/15 1526 10/11/15 1951 10/11/15 2347  GLUCAP 122* 125* 127*         IMAGING x48h  - image(s) personally visualized  -   highlighted in bold Dg Chest Port 1 View  10/11/2015  CLINICAL DATA:  Endotracheal tube EXAM: PORTABLE CHEST 1 VIEW COMPARISON:  Yesterday FINDINGS: Feeding tube at least reaches the stomach, with coil seen in the fundus. Low endotracheal tube with tip 17 mm above the carina. At least segmental atelectasis at the right base. Borderline cardiomegaly, stable. No edema, effusion, or pneumothorax. IMPRESSION: 1. Low endotracheal tube, tip 17 mm above the carina. 2. Right basilar atelectasis similar to prior. Electronically Signed   By:  Monte Fantasia M.D.   On: 10/11/2015 07:37         DISCUSSION: 60 year old female past medical history of coronary artery disease, ischemic cardiomyopathy, and polysubstance abuse being admitted with right thalamic intracranial hemorrhage. She is intubated for airway protection and was transferred to [facility] for ICU admission. Will maintain on ventilator with tight blood pressure control with Cardene infusion. Neurology and neurosurgery consulted.  IVC drain placed early am 6/16.  Poor prognosis.   ASSESSMENT / PLAN:  PULMONARY A: Acute Hypoxemic resp failure 2/2 Unable to Protect Airway - Secondary to [place]. CXR 6/21 Possible LLL infiltrate, possible HCAP.    - does not meet sbt criteria  P:   Intubated, not weanable Wean O2 for sats > 90%.   CARDIOVASCULAR A:  Chronic Systolic CHF - EF 123456 w/ grade 1 diastolic dysfunction. RV normal. H/O Hypertension H/O CAD - S/P PCI in 2015 H/O Hyperlipidemia   - not on pressors or cleviprex  P:  Tight BP control with SBP goal <  120mmHg Continue clevidipine if needed per NSGY Continue carvedilol and increase 6/25, HCTZ, losartan per tube   RENAL A:   Metabolic Acidosis - resolved Hypokalemia  P:   Monitor renal function and UOP Strict I&O Correct electrolytes as indicated   GASTROINTESTINAL A:   H/O GERD P:   Restart TF  Pepcid IV  HEMATOLOGIC A:   No acute issues. P:  Follow CBC SCDs No chemical DVT ppx given ICH  INFECTIOUS A:   Persistent fever. CXR with possible LLL infiltrate (possible LLL HCAP) H/O HSV Infection P:   Pip/tazo completed 6/26  ENDOCRINE CBG (last 3)   Recent Labs  10/11/15 1526 10/11/15 1951 10/11/15 2347  GLUCAP 122* 125* 127*     A:   No acute issues P:   CBG's Q 4 hours SSI Follow glucose on BMP  NEUROLOGIC A:   Right Thalamic Intracranial Hemorrhage s/p IVC 6/16 Cocaine Abuse - urine drug screen positive for cocaine on admission H/O EtOH Abuse H/O  Depression Following commands on the right side only.  - deeply comatose, RASS -5 equivalent. Off sedation  P:  Sedation: off, but gets prn fentanyl for coughing RASS goal: 0 but not achivevable prognostically Neurosurgery assistance appreciated, drain management as per their recs.  Blood pressure control as above. Poor prognosis   FAMILY  - Updates:  Boyfriend updated at bedside 6/25  . Brother and sisnter updated  NSGY plans to talk to family  No one at bedside 6/27-28   [facility] Eman Morimoto ACNP Maryanna Shape PCCM Pager [phone] till 3 pm If no answer page [phone] 10/12/2015, 8:48 AM

## 2015-10-12 NOTE — Progress Notes (Signed)
Significant improvement overnight PERRL Moving right side purposefully Will clamp EVD and monitor ICP

## 2015-10-13 ENCOUNTER — Inpatient Hospital Stay (HOSPITAL_COMMUNITY): Payer: Medicaid Other

## 2015-10-13 LAB — BASIC METABOLIC PANEL
Anion gap: 8 (ref 5–15)
BUN: 21 mg/dL — ABNORMAL HIGH (ref 6–20)
CHLORIDE: 109 mmol/L (ref 101–111)
CO2: 26 mmol/L (ref 22–32)
Calcium: 9.4 mg/dL (ref 8.9–10.3)
Creatinine, Ser: 0.86 mg/dL (ref 0.44–1.00)
GFR calc non Af Amer: >60 mL/min (ref >60)
Glucose, Bld: 127 mg/dL — ABNORMAL HIGH (ref 65–99)
POTASSIUM: 3.1 mmol/L — AB (ref 3.5–5.1)
SODIUM: 143 mmol/L (ref 135–145)

## 2015-10-13 LAB — CBC WITH DIFFERENTIAL/PLATELET
BASOS ABS: 0 10*3/uL (ref 0.0–0.1)
Basophils Relative: 1 %
EOS PCT: 4 %
Eosinophils Absolute: 0.3 10*3/uL (ref 0.0–0.7)
HEMATOCRIT: 28.5 % — AB (ref 36.0–46.0)
Hemoglobin: 9.2 g/dL — ABNORMAL LOW (ref 12.0–15.0)
LYMPHS ABS: 2 10*3/uL (ref 0.7–4.0)
LYMPHS PCT: 25 %
MCH: 32.2 pg (ref 26.0–34.0)
MCHC: 32.3 g/dL (ref 30.0–36.0)
MCV: 99.7 fL (ref 78.0–100.0)
MONO ABS: 0.7 10*3/uL (ref 0.1–1.0)
Monocytes Relative: 8 %
NEUTROS ABS: 5 10*3/uL (ref 1.7–7.7)
Neutrophils Relative %: 62 %
Platelets: 352 10*3/uL (ref 150–400)
RBC: 2.86 MIL/uL — ABNORMAL LOW (ref 3.87–5.11)
RDW: 13.9 % (ref 11.5–15.5)
WBC: 7.9 10*3/uL (ref 4.0–10.5)

## 2015-10-13 LAB — GLUCOSE, CAPILLARY
GLUCOSE-CAPILLARY: 108 mg/dL — AB (ref 65–99)
GLUCOSE-CAPILLARY: 122 mg/dL — AB (ref 65–99)
Glucose-Capillary: 121 mg/dL — ABNORMAL HIGH (ref 65–99)
Glucose-Capillary: 121 mg/dL — ABNORMAL HIGH (ref 65–99)
Glucose-Capillary: 135 mg/dL — ABNORMAL HIGH (ref 65–99)
Glucose-Capillary: 115 mg/dL — ABNORMAL HIGH (ref 65–99)
Glucose-Capillary: 140 mg/dL — ABNORMAL HIGH (ref 65–99)

## 2015-10-13 LAB — MAGNESIUM: Magnesium: 2.4 mg/dL (ref 1.7–2.4)

## 2015-10-13 LAB — PHOSPHORUS: Phosphorus: 4.6 mg/dL (ref 2.5–4.6)

## 2015-10-13 MED ORDER — POTASSIUM CHLORIDE 10 MEQ/100ML IV SOLN
10.0000 meq | INTRAVENOUS | Status: AC
  Administered 2015-10-13 (×4): 10 meq via INTRAVENOUS
  Filled 2015-10-13 (×4): qty 100

## 2015-10-13 MED ORDER — AMLODIPINE 1 MG/ML ORAL SUSPENSION
5.0000 mg | Freq: Every day | ORAL | Status: DC
  Administered 2015-10-14 – 2015-10-15 (×2): 5 mg
  Filled 2015-10-13 (×4): qty 5

## 2015-10-13 MED ORDER — ACETAMINOPHEN 160 MG/5ML PO SOLN
650.0000 mg | ORAL | Status: DC | PRN
  Administered 2015-10-13 – 2015-10-28 (×15): 650 mg
  Filled 2015-10-13 (×15): qty 20.3

## 2015-10-13 NOTE — Progress Notes (Signed)
RT transported patient to CT from 70M and back without any complications. RT will continue to monitor.

## 2015-10-13 NOTE — Progress Notes (Signed)
Pt was having signs of rhythmic motion on left side of body earlier appeared to be shivering type motion as if patient was cold. Pt would still respond to pain on the right side of the body purposefully during the episode. Would continue to watch. When the nurse practitioner Valetta Fuller was at bedside the pt appeared to again have rhythmic motion on the rt side this time mostly in the face and arm and right foot. Pt would still move the right side during the episode to pain but would return to the rhythmic motion. Pt is not on any anti seizure medication. Per CCM NP requested to make NS MD aware. Notified NS MD at this time. No new orders to carry out at this moment. Will continue to monitor.

## 2015-10-13 NOTE — Progress Notes (Signed)
No changes Exam stable EVD patent CT shows stable hematoma Continue drain, attempt to wean in several days

## 2015-10-13 NOTE — Progress Notes (Signed)
PULMONARY / CRITICAL CARE MEDICINE   Name: Joanna Farmer MRN: [id number] DOB: Nov 22, 1955    ADMISSION DATE:  09/29/2015 CONSULTATION DATE:  09/29/2015  REFERRING MD:  Ezequiel Essex, M.D. / [facility] EDP  CHIEF COMPLAINT:  Altered mental status  BRIEF   60 year old female past medical history as below, which is significant for hypertension, alcohol abuse, cocaine abuse, HSV, coronary artery disease status post stents to RCA and LAD in 2015, and ischemic cardiomyopathy with LV EF 35-40%. She presented to [facility] emergency department 6/15 with altered mental status. Reportedly the night before she suffered a fall during the night, and since that time has been sleeping all day. She was brought to the emergency department with this complaint. In emergency department she was unresponsive but reportedly breathing adequately. She had no spontaneous movement and left gaze deviation, but did have a positive gag reflex. CT scan of the head was obtained and demonstrated acute intracranial hemorrhage with intraventricular extension. Cervical spine was without radiographic evidence of injury. She was intubated for airway protection with anticipation of transfer to [facility]. PCCM accepting.    STUDIES:  CT Head W/O 6/15: Acute intracranial hemorrhage with intraventricular extension and possible developing hydrocephalus Although no fractures identified in the cervical spine, the evaluation of the cervical spine is limited. CT Neck 6/15: Limited evaluation of cervical spine due to motion. No fractures evident. Port CXR 6/15: Silhouetting left hemidiaphragm. Endotracheal tube tip at the level of the carina to nearly entering right mainstem bronchus. Orogastric tube passing below diaphragm. Port CXR 6/16>>ETT 4.5 cm above carina. Atelectasis in lung bases CT head 6/19 > no significant change since compared to prior scan. CT Head  10/04/2015 >>Degenerating RIGHT thalamus hematoma with local mass effect  and edema. Similar mild hydrocephalus, stable RIGHT frontal ventriculostomy catheter. Degenerating intraventricular blood products.  CT head 6/29>>> Right thalamic hematoma unchanged. Decreased intraventricular hemorrhage and decreased ventricular size since the prior CT.  MICROBIOLOGY: MRSA PCR 6/15 >> (-) Blood Ctx x2 6/15 >> (-) Blood 6/20 > (-) Sputum 6/20 > (-) Urine 6/20 > (-)  ANTIBIOTICS: 6/15 Rocephin >>6/18 6/16 Acef (prophylactic abx) >> 6/18 Zosyn 6/20 >  Vanc 6/20 > 6/22   LINES/TUBES: OETT 7.5 6/15 >> 6/24 replaced 6/24>> Foley 6/15 >> OGT 6/15 >> 6/24 PIV X3 IVC Drain 6/16 >>    SIGNIFICANT EVENTS: 6/15 - Admit to ICU on ventilator for ICH after transfer from [place] ED 6/25 Extubated successfully - re intubated     SUBJECTIVE/OVERNIGHT/INTERVAL HX No sig change overnight.  CT stable.  Unable to wean cleviprex for HTN.   VITAL SIGNS: BP 127/48 mmHg  Pulse 63  Temp(Src) 98.9 F (37.2 C) (Oral)  Resp 14  Ht 5\' 3"  (1.6 m)  Wt 47.4 kg (104 lb 8 oz)  BMI 18.52 kg/m2  SpO2 100%  HEMODYNAMICS:    VENTILATOR SETTINGS: Vent Mode:  [-] CPAP;PSV FiO2 (%):  [30 %] 30 % Set Rate:  [16 bmp] 16 bmp Vt Set:  [420 mL] 420 mL PEEP:  [5 cmH20] 5 cmH20 Pressure Support:  [8 cmH20] 8 cmH20 Plateau Pressure:  [14 cmH20-16 cmH20] 14 cmH20  INTAKE / OUTPUT: I/O last 3 completed shifts: In: [id number] [I.V.:1610; NG/GT:1785; IV Piggyback:250] Out: [id number] [Drains:272; Stool:650]  PHYSICAL EXAMINATION: General:  Ill appearing female, NAD with IVC drain.  Integument:  Warm & dry. No rash on exposed skin.  HEENT:  Drain in place. No scleral injection or icterus. no significant oral secretions. Pupils 61mm,  NR Cardiovascular:  RRR, no M/R/G. Pulmonary:  resps even non labored on PS 8/5, Coarse bilaterally.   Abdomen: Soft. Normal bowel sounds. Nondistended.  Musculoskeletal:  Normal bulk. No joint deformity or effusion appreciated. Neurological: NO sedation, moves RLE  spontaneously, ?some purposeful movements per nursing but does not follow commands.  LABS: PULMONARY  Recent Labs Lab 10/09/15 1808  PHART 7.435  PCO2ART 39.0  PO2ART 163.0*  HCO3 26.2*  TCO2 27  O2SAT 100.0    CBC  Recent Labs Lab 10/11/15 0331 10/12/15 0500 10/13/15 0622  HGB 9.4* 9.4* 9.2*  HCT 29.0* 27.6* 28.5*  WBC 9.7 7.5 7.9  PLT 354 346 352    COAGULATION No results for input(s): INR in the last 168 hours.  CARDIAC  No results for input(s): TROPONINI in the last 168 hours. No results for input(s): PROBNP in the last 168 hours.   CHEMISTRY  Recent Labs Lab 10/09/15 0324 10/10/15 0418 10/11/15 0331 10/12/15 0500 10/13/15 0622  NA 132* 135 135 140 143  K 3.2* 4.3 3.3* 3.9 3.1*  CL 98* 102 101 105 109  CO2 23 22 23 23 26   GLUCOSE 140* 122* 145* 115* 127*  BUN 16 33* 22* 25* 21*  CREATININE 0.72 1.44* 0.75 0.90 0.86  CALCIUM 9.2 9.4 9.6 9.3 9.4  MG 2.2 2.2 2.4 2.4 2.4  PHOS  --   --  3.5 3.2 4.6   Estimated Creatinine Clearance: 52.1 mL/min (by C-G formula based on Cr of 0.86).   LIVER No results for input(s): AST, ALT, ALKPHOS, BILITOT, PROT, ALBUMIN, INR in the last 168 hours.   INFECTIOUS  Recent Labs Lab 10/07/15 0344  PROCALCITON <0.10     ENDOCRINE CBG (last 3)   Recent Labs  10/13/15 0007 10/13/15 0358 10/13/15 0734  GLUCAP 121* 121* 135*       IMAGING x48h Ct Head Wo Contrast  10/13/2015  CLINICAL DATA:  Intracranial hemorrhage follow-up EXAM: CT HEAD WITHOUT CONTRAST TECHNIQUE: Contiguous axial images were obtained from the base of the skull through the vertex without intravenous contrast. COMPARISON:  10/10/2015 FINDINGS: Hemorrhage in the right thalamus and basal ganglia unchanged measuring 33 x 39 mm. Hypodensity edema surrounds the hematoma. Hematoma displaces the midline to the left unchanged. Right frontal ventricular catheter tip in the right frontal horn unchanged. Decreased intraventricular hemorrhage.  Decreased ventricular size since the prior study. No acute infarct.  No evidence of new hemorrhage since prior study. IMPRESSION: Right thalamic hematoma unchanged. Decreased intraventricular hemorrhage and decreased ventricular size since the prior CT. Electronically Signed   By: Franchot Gallo M.D.   On: 10/13/2015 07:05      DISCUSSION: 60 year old female past medical history of coronary artery disease, ischemic cardiomyopathy, and polysubstance abuse being admitted with right thalamic intracranial hemorrhage. She is intubated for airway protection and was transferred to [facility] for ICU admission. Will maintain on ventilator with tight blood pressure control with Cardene infusion. Neurology and neurosurgery consulted.  IVC drain placed early am 6/16.  Poor prognosis.   ASSESSMENT / PLAN:  PULMONARY A: Acute Hypoxemic resp failure 2/2 Unable to Protect Airway - Secondary to [place]. CXR 6/21 Possible LLL infiltrate, possible HCAP.  P:   Vent support - 8cc/kg  F/u CXR  F/u ABG PS wean ok but NOT ready for extubation r/t poor mental status  Wean O2 for sats > 90%.   CARDIOVASCULAR A:  Chronic Systolic CHF - EF [id number] w/ grade 1 diastolic dysfunction. RV normal. Hypertension H/O  CAD - S/P PCI in 2015 H/O Hyperlipidemia P:  Tight BP control with SBP goal < 132mmHg will add amlodipine 5mg  daily 6/29 Continue carvedilol Attempt wean cleviprex  Continue clevidipine if needed per NSGY    RENAL A:   Metabolic Acidosis - resolved Hypokalemia PRN  P:   Monitor renal function and UOP Strict I&O Correct electrolytes as indicated Replete K    GASTROINTESTINAL A:   H/O GERD P:   Restart TF  Pepcid IV  HEMATOLOGIC A:   No acute issues. P:  Follow CBC SCDs No chemical DVT ppx given ICH  INFECTIOUS A:   Persistent fever. CXR with possible LLL infiltrate (possible LLL HCAP) H/O HSV Infection P:   Pip/tazo completed 6/26  ENDOCRINE A:   No acute issues P:   CBG's Q  4 hours SSI Follow glucose on BMP  NEUROLOGIC A:   Right Thalamic Intracranial Hemorrhage s/p IVC 6/16 Cocaine Abuse  H/O EtOH Abuse H/O Depression Following commands on the right side only. P:  Sedation off  RASS goal: 0 but not achivevable prognostically Neurosurgery assistance appreciated, drain management as per their recs.  Blood pressure control as above. Poor prognosis   FAMILY  - Updates: no family available 6/29   Nickolas Madrid, NP 10/13/2015  12:11 PM Pager: [phone] or [phone]

## 2015-10-13 NOTE — Progress Notes (Signed)
Called [facility] about refractory hyperthermia.  Acetaminophen order changed form Q6 to Q4.

## 2015-10-14 LAB — CBC WITH DIFFERENTIAL/PLATELET
Basophils Absolute: 0 10*3/uL (ref 0.0–0.1)
Basophils Relative: 1 %
EOS PCT: 4 %
Eosinophils Absolute: 0.2 10*3/uL (ref 0.0–0.7)
HEMATOCRIT: 26.8 % — AB (ref 36.0–46.0)
Hemoglobin: 8.5 g/dL — ABNORMAL LOW (ref 12.0–15.0)
LYMPHS PCT: 19 %
Lymphs Abs: 1.2 10*3/uL (ref 0.7–4.0)
MCH: 31.4 pg (ref 26.0–34.0)
MCHC: 31.7 g/dL (ref 30.0–36.0)
MCV: 98.9 fL (ref 78.0–100.0)
MONO ABS: 0.4 10*3/uL (ref 0.1–1.0)
MONOS PCT: 7 %
NEUTROS ABS: 4.6 10*3/uL (ref 1.7–7.7)
Neutrophils Relative %: 69 %
PLATELETS: 381 10*3/uL (ref 150–400)
RBC: 2.71 MIL/uL — ABNORMAL LOW (ref 3.87–5.11)
RDW: 14 % (ref 11.5–15.5)
WBC: 6.5 10*3/uL (ref 4.0–10.5)

## 2015-10-14 LAB — GLUCOSE, CAPILLARY
GLUCOSE-CAPILLARY: 106 mg/dL — AB (ref 65–99)
GLUCOSE-CAPILLARY: 111 mg/dL — AB (ref 65–99)
GLUCOSE-CAPILLARY: 124 mg/dL — AB (ref 65–99)
Glucose-Capillary: 123 mg/dL — ABNORMAL HIGH (ref 65–99)
Glucose-Capillary: 122 mg/dL — ABNORMAL HIGH (ref 65–99)
Glucose-Capillary: 120 mg/dL — ABNORMAL HIGH (ref 65–99)
Glucose-Capillary: 109 mg/dL — ABNORMAL HIGH (ref 65–99)

## 2015-10-14 LAB — LACTIC ACID, PLASMA: LACTIC ACID, VENOUS: 0.7 mmol/L (ref 0.5–1.9)

## 2015-10-14 LAB — BASIC METABOLIC PANEL
Anion gap: 9 (ref 5–15)
BUN: 17 mg/dL (ref 6–20)
CHLORIDE: 107 mmol/L (ref 101–111)
CO2: 24 mmol/L (ref 22–32)
Calcium: 9.5 mg/dL (ref 8.9–10.3)
Creatinine, Ser: 0.68 mg/dL (ref 0.44–1.00)
GFR calc Af Amer: >60 mL/min (ref >60)
GFR calc non Af Amer: >60 mL/min (ref >60)
GLUCOSE: 123 mg/dL — AB (ref 65–99)
POTASSIUM: 3.6 mmol/L (ref 3.5–5.1)
Sodium: 140 mmol/L (ref 135–145)

## 2015-10-14 LAB — MAGNESIUM: Magnesium: 2.2 mg/dL (ref 1.7–2.4)

## 2015-10-14 LAB — PHOSPHORUS: Phosphorus: 3.4 mg/dL (ref 2.5–4.6)

## 2015-10-14 LAB — PROCALCITONIN

## 2015-10-14 NOTE — Progress Notes (Signed)
Nutrition Follow-up  DOCUMENTATION CODES:   Not applicable  INTERVENTION:  Continue Vital AF 1.2 at goal rate of 45 ml/hr via NGT to provide 1296 kcal, 81 grams of protein, and 875 ml of free water.   RD to continue to monitor.   NUTRITION DIAGNOSIS:   Inadequate oral intake related to inability to eat as evidenced by NPO status; ongoing  GOAL:   Patient will meet greater than or equal to 90% of their needs; met  MONITOR:   TF tolerance, Vent status, Labs  REASON FOR ASSESSMENT:   Ventilator    ASSESSMENT:   Pt with past medical history of coronary artery disease, ischemic cardiomyopathy, and polysubstance abuse being admitted with right thalamic intracranial hemorrhage, right frontal external ventricular drain placed 6/16.   Patient is currently intubated on ventilator support MV: 8.6 L/min Temp (24hrs), Avg:100.9 F (38.3 C), Min:98.4 F (36.9 C), Max:102.8 F (39.3 C)  Propofol: off Cleviprex: 10 ml/hr which provides 480 kcal/day   Per MD note, will attempt to wean cleviprex today. Plans for PS wean but not ready for extubation due to poor mental status. RD to continue with current TF orders.   Labs and medications reviewed.   Diet Order:  Diet NPO time specified  Skin:  Reviewed, no issues  Last BM:  6/29 225 ml rectal pouch  Height:   Ht Readings from Last 1 Encounters:  09/29/15 '5\' 3"'$  (1.6 m)    Weight:   Wt Readings from Last 1 Encounters:  10/14/15 109 lb 2 oz (49.5 kg)    Ideal Body Weight:  52.2 kg  BMI:  Body mass index is 19.34 kg/(m^2).  Estimated Nutritional Needs:   Kcal:  1270  Protein:  70-85 grams  Fluid:  > 1.5 L/day  EDUCATION NEEDS:   No education needs identified at this time  Corrin Parker, MS, RD, LDN Pager # [phone] After hours/ weekend pager # [phone]

## 2015-10-14 NOTE — Progress Notes (Signed)
PULMONARY / CRITICAL CARE MEDICINE   Name: Joanna Farmer MRN: [id number] DOB: [birth year]    ADMISSION DATE:  09/29/2015 CONSULTATION DATE:  09/29/2015  REFERRING MD:  Ezequiel Essex, M.D. / [facility]  CHIEF COMPLAINT:  Altered mental status  BRIEF   60 year old female past medical history as below, which is significant for hypertension, alcohol abuse, cocaine abuse, HSV, coronary artery disease status post stents to RCA and LAD in 2015, and ischemic cardiomyopathy with LV EF 35-40%. She presented to [facility] emergency department 6/15 with altered mental status. Reportedly the night before she suffered a fall during the night, and since that time has been sleeping all day. She was brought to the emergency department with this complaint. In emergency department she was unresponsive but reportedly breathing adequately. She had no spontaneous movement and left gaze deviation, but did have a positive gag reflex. CT scan of the head was obtained and demonstrated acute intracranial hemorrhage with intraventricular extension. Cervical spine was without radiographic evidence of injury. She was intubated for airway protection with anticipation of transfer to [facility]. PCCM accepting.   MICROBIOLOGY: MRSA PCR 6/15 >> (-) Blood Ctx x2 6/15 >> (-) Blood 6/20 > (-) Sputum 6/20 > (-) Urine 6/20 > (-)  ANTIBIOTICS: 6/15 Rocephin >>6/18 6/16 Acef (prophylactic abx) >> 6/18 Zosyn 6/20 >  Vanc 6/20 > 6/22   STUDIES:    LINES/TUBES: OETT 7.5 6/15 >> 6/24 replaced 6/24>> Foley 6/15 >> OGT 6/15 >> 6/24 PIV X3 IVC Drain 6/16 >>    SIGNIFICANT EVENTS: 6/15 - Admit to ICU on ventilator for ICH after transfer from [facility] ED CT Head W/O 6/15: Acute intracranial hemorrhage with intraventricular extension and possible developing hydrocephalus Although no fractures identified in the cervical spine, the evaluation of the cervical spine is limited. CT Neck 6/15: Limited evaluation of cervical spine  due to motion. No fractures evident. Port CXR 6/15: Silhouetting left hemidiaphragm. Endotracheal tube tip at the level of the carina to nearly entering right mainstem bronchus. Orogastric tube passing below diaphragm. Port CXR 6/16>>ETT 4.5 cm above carina. Atelectasis in lung bases CT head 6/19 > no significant change since compared to prior scan. CT Head  10/04/2015 >>Degenerating RIGHT thalamus hematoma with local mass effect and edema. Similar mild hydrocephalus, stable RIGHT frontal ventriculostomy catheter. Degenerating intraventricular blood products.    6/25 Extubated successfully - re intubated   6/28: no sig change overnight.  CT stable.  Unable to wean cleviprex for HTN.    SUBJECTIVE/OVERNIGHT/INTERVAL HX 6/29: ongoing fevers withotu change in WBC. Moving right side. Doing SBt. Left side only flicker to pain occ per RN. Still on cleviprex - rn says new bp goal per NSGY is sbp < 140. Doing PSVT   VITAL SIGNS: BP 138/65 mmHg  Pulse 76  Temp(Src) 102.1 F (38.9 C) (Oral)  Resp 17  Ht 5\' 3"  (1.6 m)  Wt 49.5 kg (109 lb 2 oz)  BMI 19.34 kg/m2  SpO2 100%  HEMODYNAMICS:    VENTILATOR SETTINGS: Vent Mode:  [-] PSV;CPAP FiO2 (%):  [30 %] 30 % Set Rate:  [16 bmp] 16 bmp Vt Set:  [420 mL] 420 mL PEEP:  [5 cmH20] 5 cmH20 Pressure Support:  [8 cmH20] 8 cmH20 Plateau Pressure:  [11 cmH20-14 cmH20] 11 cmH20  INTAKE / OUTPUT: I/O last 3 completed shifts: In: 3342.8 [I.V.:1057.8; NG/GT:1635; IV [id number] Out: [id number] [Drains:329; Stool:500]  PHYSICAL EXAMINATION: General:  Ill appearing female, NAD with IVC drain.  Integument:  Warm &  dry. No rash on exposed skin.  HEENT:  Drain in place. No scleral injection or icterus. no significant oral secretions. Pupils 52mm, NR Cardiovascular:  RRR, no M/R/G. Pulmonary:  resps even non labored on PS 8/5, Coarse bilaterally.   Abdomen: Soft. Normal bowel sounds. Nondistended.  Musculoskeletal:  Normal bulk. No joint deformity  or effusion appreciated. Neurological: NO sedation, moves RLE spontaneously, ?some purposeful movements per nursing but does not follow commands.  LABS: PULMONARY  Recent Labs Lab 10/09/15 1808  PHART 7.435  PCO2ART 39.0  PO2ART 163.0*  HCO3 26.2*  TCO2 27  O2SAT 100.0    CBC  Recent Labs Lab 10/12/15 0500 10/13/15 0622 10/14/15 0400  HGB 9.4* 9.2* 8.5*  HCT 27.6* 28.5* 26.8*  WBC 7.5 7.9 6.5  PLT 346 352 381    COAGULATION No results for input(s): INR in the last 168 hours.  CARDIAC  No results for input(s): TROPONINI in the last 168 hours. No results for input(s): PROBNP in the last 168 hours.   CHEMISTRY  Recent Labs Lab 10/10/15 0418 10/11/15 0331 10/12/15 0500 10/13/15 0622 10/14/15 0400  NA 135 135 140 143 140  K 4.3 3.3* 3.9 3.1* 3.6  CL 102 101 105 109 107  CO2 22 23 23 26 24   GLUCOSE 122* 145* 115* 127* 123*  BUN 33* 22* 25* 21* 17  CREATININE 1.44* 0.75 0.90 0.86 0.68  CALCIUM 9.4 9.6 9.3 9.4 9.5  MG 2.2 2.4 2.4 2.4 2.2  PHOS  --  3.5 3.2 4.6 3.4   Estimated Creatinine Clearance: 58.4 mL/min (by C-G formula based on Cr of 0.68).   LIVER No results for input(s): AST, ALT, ALKPHOS, BILITOT, PROT, ALBUMIN, INR in the last 168 hours.   INFECTIOUS No results for input(s): LATICACIDVEN, PROCALCITON in the last 168 hours.   ENDOCRINE CBG (last 3)   Recent Labs  10/13/15 2339 10/14/15 0347 10/14/15 0744  GLUCAP 106* 123* 122*       IMAGING x48h Ct Head Wo Contrast  10/13/2015  CLINICAL DATA:  Intracranial hemorrhage follow-up EXAM: CT HEAD WITHOUT CONTRAST TECHNIQUE: Contiguous axial images were obtained from the base of the skull through the vertex without intravenous contrast. COMPARISON:  10/10/2015 FINDINGS: Hemorrhage in the right thalamus and basal ganglia unchanged measuring 33 x 39 mm. Hypodensity edema surrounds the hematoma. Hematoma displaces the midline to the left unchanged. Right frontal ventricular catheter tip in  the right frontal horn unchanged. Decreased intraventricular hemorrhage. Decreased ventricular size since the prior study. No acute infarct.  No evidence of new hemorrhage since prior study. IMPRESSION: Right thalamic hematoma unchanged. Decreased intraventricular hemorrhage and decreased ventricular size since the prior CT. Electronically Signed   By: Franchot Gallo M.D.   On: 10/13/2015 07:05      DISCUSSION: 60 year old female past medical history of coronary artery disease, ischemic cardiomyopathy, and polysubstance abuse being admitted with right thalamic intracranial hemorrhage. She is intubated for airway protection and was transferred to [facility] for ICU admission. Will maintain on ventilator with tight blood pressure control with Cardene infusion. Neurology and neurosurgery consulted.  IVC drain placed early am 6/16.  Poor prognosis.   ASSESSMENT / PLAN:  PULMONARY A: Acute Hypoxemic resp failure 2/2 Unable to Protect Airway - Secondary to [place]. CXR 6/21 Possible LLL infiltrate, possible HCAP.    - doing SBT but mental status precludes extubation P:   PS wean ok but NOT ready for extubation r/t poor mental status  Wean O2 for sats >  90%.   CARDIOVASCULAR A:  Chronic Systolic CHF - EF 123456 w/ grade 1 diastolic dysfunction. RV normal. Hypertension H/O CAD - S/P PCI in 2015 H/O Hyperlipidemia  - still on cleviprex P:  Tight BP control with SBP goal < 138mmHg will add amlodipine 5mg  daily 6/29 Continue carvedilol Attempt wean cleviprex  Continue clevidipine if needed per NSGY    RENAL A:   Metabolic Acidosis - resolved    - nil acute   P:   Monitor renal function and UOP Strict I&O Correct electrolytes as indicated Replete K    GASTROINTESTINAL A:   H/O GERD P:   Restart TF  Pepcid IV  HEMATOLOGIC A:   No acute issues. P:  Follow CBC SCDs No chemical DVT ppx given ICH  INFECTIOUS Results for orders placed or performed during the hospital  encounter of 09/29/15  Blood culture (routine x 2)     Status: None   Collection Time: 09/29/15  4:43 PM  Result Value Ref Range Status   Specimen Description BLOOD RIGHT ANTECUBITAL  Final   Special Requests BOTTLES DRAWN AEROBIC AND ANAEROBIC 6CC  Final   Culture NO GROWTH 5 DAYS  Final   Report Status 10/04/2015 FINAL  Final  Blood culture (routine x 2)     Status: None   Collection Time: 09/29/15  4:49 PM  Result Value Ref Range Status   Specimen Description BLOOD LEFT HAND  Final   Special Requests BOTTLES DRAWN AEROBIC AND ANAEROBIC 6CC  Final   Culture NO GROWTH 5 DAYS  Final   Report Status 10/04/2015 FINAL  Final  MRSA PCR Screening     Status: None   Collection Time: 09/30/15  1:27 AM  Result Value Ref Range Status   MRSA by PCR NEGATIVE NEGATIVE Final    Comment:        The GeneXpert MRSA Assay (FDA approved for NASAL specimens only), is one component of a comprehensive MRSA colonization surveillance program. It is not intended to diagnose MRSA infection nor to guide or monitor treatment for MRSA infections.   Culture, Urine     Status: Abnormal   Collection Time: 09/30/15  9:51 AM  Result Value Ref Range Status   Specimen Description URINE, CATHETERIZED  Final   Special Requests none.  Final   Culture 30,000 COLONIES/mL CITROBACTER YOUNGAE (A)  Final   Report Status 10/02/2015 FINAL  Final   Organism ID, Bacteria CITROBACTER YOUNGAE (A)  Final      Susceptibility   Citrobacter youngae - MIC*    CEFAZOLIN <=4 RESISTANT Resistant     CEFTRIAXONE <=1 SENSITIVE Sensitive     CIPROFLOXACIN <=0.25 SENSITIVE Sensitive     GENTAMICIN <=1 SENSITIVE Sensitive     IMIPENEM <=0.25 SENSITIVE Sensitive     NITROFURANTOIN <=16 SENSITIVE Sensitive     TRIMETH/SULFA <=20 SENSITIVE Sensitive     PIP/TAZO <=4 SENSITIVE Sensitive     * 30,000 COLONIES/mL CITROBACTER YOUNGAE  Culture, blood (routine x 2)     Status: None   Collection Time: 10/04/15  4:15 PM  Result Value Ref  Range Status   Specimen Description BLOOD LEFT HAND  Final   Special Requests IN PEDIATRIC BOTTLE 3CC  Final   Culture NO GROWTH 5 DAYS  Final   Report Status 10/09/2015 FINAL  Final  Culture, respiratory (NON-Expectorated)     Status: None   Collection Time: 10/04/15  4:22 PM  Result Value Ref Range Status   Specimen Description TRACHEAL ASPIRATE  Final   Special Requests Normal  Final   Gram Stain   Final    FEW WBC PRESENT,BOTH PMN AND MONONUCLEAR NO ORGANISMS SEEN    Culture Consistent with normal respiratory flora.  Final   Report Status 10/07/2015 FINAL  Final  Culture, blood (routine x 2)     Status: None   Collection Time: 10/04/15  4:25 PM  Result Value Ref Range Status   Specimen Description BLOOD RIGHT HAND  Final   Special Requests BOTTLES DRAWN AEROBIC AND ANAEROBIC 5CC  Final   Culture NO GROWTH 5 DAYS  Final   Report Status 10/09/2015 FINAL  Final  CSF culture with Stat gram stain     Status: None   Collection Time: 10/04/15  5:36 PM  Result Value Ref Range Status   Specimen Description CSF  Final   Special Requests SHUNT  Final   Gram Stain   Final    FEW WBC PRESENT,BOTH PMN AND MONONUCLEAR NO ORGANISMS SEEN    Culture NO GROWTH 3 DAYS  Final   Report Status 10/08/2015 FINAL  Final    A:   H/O HSV Infection  Persistent fever. CXR with possible LLL infiltrate (possible LLL HCAP); fever still ongoing  P:   Check pct and lactate 10/14/2015  Antibiotics Given (last 72 hours)    Date/Time Action Medication Dose Rate   10/11/15 1337 Given   piperacillin-tazobactam (ZOSYN) IVPB 3.375 g 3.375 g 12.5 mL/hr   10/11/15 2101 Given   piperacillin-tazobactam (ZOSYN) IVPB 3.375 g 3.375 g 12.5 mL/hr   10/12/15 0542 Given   piperacillin-tazobactam (ZOSYN) IVPB 3.375 g 3.375 g 12.5 mL/hr   10/12/15 1415 Given   piperacillin-tazobactam (ZOSYN) IVPB 3.375 g 3.375 g 12.5 mL/hr   10/12/15 2126 Given   piperacillin-tazobactam (ZOSYN) IVPB 3.375 g 3.375 g 12.5 mL/hr    10/13/15 0549 Given   piperacillin-tazobactam (ZOSYN) IVPB 3.375 g 3.375 g 12.5 mL/hr   10/13/15 1332 Given   piperacillin-tazobactam (ZOSYN) IVPB 3.375 g 3.375 g 12.5 mL/hr   10/13/15 2204 Given   piperacillin-tazobactam (ZOSYN) IVPB 3.375 g 3.375 g 12.5 mL/hr   10/14/15 0525 Given   piperacillin-tazobactam (ZOSYN) IVPB 3.375 g 3.375 g 12.5 mL/hr        ENDOCRINE A:   No acute issues P:   CBG's Q 4 hours SSI Follow glucose on BMP  NEUROLOGIC A:   Right Thalamic Intracranial Hemorrhage s/p IVC 6/16 Cocaine Abuse  H/O EtOH Abuse H/O Depression  Following commands on the right side only.- an improvement 10/13/15. Unchagned 10/14/15   P:  Sedation off  RASS goal: 0 but not achivevable prognostically Neurosurgery assistance appreciated, drain management as per their recs.  Blood pressure control as above. Poor prognosis   FAMILY  - Updates: no family available 6/29 and 10/14/15      The patient is critically ill with multiple organ systems failure and requires high complexity decision making for assessment and support, frequent evaluation and titration of therapies, application of advanced monitoring technologies and extensive interpretation of multiple databases.   Critical Care Time devoted to patient care services described in this note is  30  Minutes. This time reflects time of care of this signee Dr Brand Males. This critical care time does not reflect procedure time, or teaching time or supervisory time of PA/NP/Med student/Med Resident etc but could involve care discussion time    Dr. Brand Males, M.D., [facility].C.P Pulmonary and Critical Care Medicine Staff Physician [place] [facility] Pager: [phone], If no answer or between  15:00h - 7:00h: call [phone]  10/14/2015 11:47 AM

## 2015-10-14 NOTE — Progress Notes (Signed)
No acute events Opens eyes to painful stimulus Localizes right, trace withdrawal on left Stable Continue EVD Will attempt to wean Monday

## 2015-10-15 ENCOUNTER — Inpatient Hospital Stay (HOSPITAL_COMMUNITY): Payer: Medicaid Other

## 2015-10-15 LAB — HEPATIC FUNCTION PANEL
ALT: 46 U/L (ref 14–54)
AST: 25 U/L (ref 15–41)
Albumin: 3 g/dL — ABNORMAL LOW (ref 3.5–5.0)
Alkaline Phosphatase: 35 U/L — ABNORMAL LOW (ref 38–126)
Total Bilirubin: 0.2 mg/dL — ABNORMAL LOW (ref 0.3–1.2)
Total Protein: 7.8 g/dL (ref 6.5–8.1)

## 2015-10-15 LAB — GLUCOSE, CAPILLARY
GLUCOSE-CAPILLARY: 129 mg/dL — AB (ref 65–99)
GLUCOSE-CAPILLARY: 117 mg/dL — AB (ref 65–99)
GLUCOSE-CAPILLARY: 124 mg/dL — AB (ref 65–99)
GLUCOSE-CAPILLARY: 113 mg/dL — AB (ref 65–99)
Glucose-Capillary: 128 mg/dL — ABNORMAL HIGH (ref 65–99)

## 2015-10-15 LAB — BASIC METABOLIC PANEL
ANION GAP: 7 (ref 5–15)
BUN: 18 mg/dL (ref 6–20)
CALCIUM: 9.6 mg/dL (ref 8.9–10.3)
CO2: 24 mmol/L (ref 22–32)
Chloride: 111 mmol/L (ref 101–111)
Creatinine, Ser: 0.79 mg/dL (ref 0.44–1.00)
GFR calc Af Amer: >60 mL/min (ref >60)
GLUCOSE: 127 mg/dL — AB (ref 65–99)
Potassium: 3.1 mmol/L — ABNORMAL LOW (ref 3.5–5.1)
Sodium: 142 mmol/L (ref 135–145)

## 2015-10-15 LAB — MAGNESIUM: Magnesium: 2.5 mg/dL — ABNORMAL HIGH (ref 1.7–2.4)

## 2015-10-15 LAB — PHOSPHORUS: Phosphorus: 4.3 mg/dL (ref 2.5–4.6)

## 2015-10-15 LAB — PROCALCITONIN

## 2015-10-15 NOTE — Progress Notes (Signed)
Patient ID: Joanna Farmer, female   DOB: Aug 12, 1955, 60 y.o.   MRN: [id number] Patient withdraws to stimulation intermittently follows commands  External ventricular drain appears to be functioning well.  Plan per Dr. Cyndy Freeze is  to challenge on Monday for possible removal of drain.

## 2015-10-15 NOTE — Progress Notes (Signed)
PULMONARY / CRITICAL CARE MEDICINE   Name: Joanna Farmer MRN: [id number] DOB: 1955/06/26    ADMISSION DATE:  09/29/2015 CONSULTATION DATE:  09/29/2015  REFERRING MD:  Ezequiel Essex, M.D. / [facility] EDP  CHIEF COMPLAINT:  Altered mental status  BRIEF   60 year old female past medical history as below, which is significant for hypertension, alcohol abuse, cocaine abuse, HSV, coronary artery disease status post stents to RCA and LAD in 2015, and ischemic cardiomyopathy with LV EF 35-40%. She presented to [facility] emergency department 6/15 with altered mental status. Reportedly the night before she suffered a fall during the night, and since that time has been sleeping all day. She was brought to the emergency department with this complaint. In emergency department she was unresponsive but reportedly breathing adequately. She had no spontaneous movement and left gaze deviation, but did have a positive gag reflex. CT scan of the head was obtained and demonstrated acute intracranial hemorrhage with intraventricular extension. Cervical spine was without radiographic evidence of injury. She was intubated for airway protection with anticipation of transfer to [facility]. PCCM accepting.   MICROBIOLOGY: MRSA PCR 6/15 >> (-) Blood Ctx x2 6/15 >> (-) Blood 6/20 > (-) Sputum 6/20 > (-) Urine 6/20 > (-)  ANTIBIOTICS: 6/15 Rocephin >>6/18 6/16 Acef (prophylactic abx) >> 6/18 Zosyn 6/20 >  Vanc 6/20 > 6/22   STUDIES:    LINES/TUBES: OETT 7.5 6/15 >> 6/24 replaced 6/24>> Foley 6/15 >> OGT 6/15 >> 6/24 PIV X3 IVC Drain 6/16 >>    SIGNIFICANT EVENTS: 6/15 - Admit to ICU on ventilator for ICH after transfer from [facility] CT Head W/O 6/15: Acute intracranial hemorrhage with intraventricular extension and possible developing hydrocephalus Although no fractures identified in the cervical spine, the evaluation of the cervical spine is limited. CT Neck 6/15: Limited evaluation of cervical spine  due to motion. No fractures evident. Port CXR 6/15: Silhouetting left hemidiaphragm. Endotracheal tube tip at the level of the carina to nearly entering right mainstem bronchus. Orogastric tube passing below diaphragm. Port CXR 6/16>>ETT 4.5 cm above carina. Atelectasis in lung bases CT head 6/19 > no significant change since compared to prior scan. CT Head  10/04/2015 >>Degenerating RIGHT thalamus hematoma with local mass effect and edema. Similar mild hydrocephalus, stable RIGHT frontal ventriculostomy catheter. Degenerating intraventricular blood products.    6/25 Extubated successfully - re intubated   6/28: no sig change overnight.  CT stable.  Unable to wean cleviprex for HTN.   6/29: ongoing fevers withotu change in WBC. Moving right side. Doing SBt. Left side only flicker to pain occ per RN. Still on cleviprex - rn says new bp goal per NSGY is sbp < 140. Doing PSVT    SUBJECTIVE/OVERNIGHT/INTERVAL HX 10/15/15- per neurosurgery patient was brought per neurosurgery patient withdraws to stimulation intermittently and follows commands. X-ray ventricular drain appears to be functioning well. The plan is to possibly remove the drain on 10/17/2015. Ongoing fvers but biomarkers normal  VITAL SIGNS: BP 119/52 mmHg  Pulse 69  Temp(Src) 101.6 F (38.7 C) (Axillary)  Resp 14  Ht 5\' 3"  (1.6 m)  Wt 46.2 kg (101 lb 13.6 oz)  BMI 18.05 kg/m2  SpO2 99%  HEMODYNAMICS:    VENTILATOR SETTINGS: Vent Mode:  [-] PSV;CPAP FiO2 (%):  [30 %] 30 % Set Rate:  [16 bmp] 16 bmp Vt Set:  [420 mL] 420 mL PEEP:  [5 cmH20] 5 cmH20 Pressure Support:  [8 cmH20] 8 cmH20 Plateau Pressure:  [  10 cmH20-18 cmH20] 18 cmH20  INTAKE / OUTPUT: I/O last 3 completed shifts: In: 2363.7 [I.V.:373.7; NG/GT:1740; IV Piggyback:250] Out: [id number] [Drains:321; Stool:625]  PHYSICAL EXAMINATION: General:  Ill appearing female, NAD with IVC drain.  Integument:  Warm & dry. No rash on exposed skin.  HEENT:  Drain in  place. No scleral injection or icterus. no significant oral secretions. Pupils 46mm, NR Cardiovascular:  RRR, no M/R/G. Pulmonary:  resps even non labored on PS 8/5, Coarse bilaterally.   Abdomen: Soft. Normal bowel sounds. Nondistended.  Musculoskeletal:  Normal bulk. No joint deformity or effusion appreciated. Neurological: NO sedation, moves RLE spontaneously, ?some purposeful movements per nursing but does not follow commands.  LABS: PULMONARY  Recent Labs Lab 10/09/15 1808  PHART 7.435  PCO2ART 39.0  PO2ART 163.0*  HCO3 26.2*  TCO2 27  O2SAT 100.0    CBC  Recent Labs Lab 10/12/15 0500 10/13/15 0622 10/14/15 0400  HGB 9.4* 9.2* 8.5*  HCT 27.6* 28.5* 26.8*  WBC 7.5 7.9 6.5  PLT 346 352 381    COAGULATION No results for input(s): INR in the last 168 hours.  CARDIAC  No results for input(s): TROPONINI in the last 168 hours. No results for input(s): PROBNP in the last 168 hours.   CHEMISTRY  Recent Labs Lab 10/11/15 0331 10/12/15 0500 10/13/15 0622 10/14/15 0400 10/15/15 0331  NA 135 140 143 140 142  K 3.3* 3.9 3.1* 3.6 3.1*  CL 101 105 109 107 111  CO2 23 23 26 24 24   GLUCOSE 145* 115* 127* 123* 127*  BUN 22* 25* 21* 17 18  CREATININE 0.75 0.90 0.86 0.68 0.79  CALCIUM 9.6 9.3 9.4 9.5 9.6  MG 2.4 2.4 2.4 2.2 2.5*  PHOS 3.5 3.2 4.6 3.4 4.3   Estimated Creatinine Clearance: 54.5 mL/min (by C-G formula based on Cr of 0.79).   LIVER  Recent Labs Lab 10/15/15 0331  AST 25  ALT 46  ALKPHOS 35*  BILITOT 0.2*  PROT 7.8  ALBUMIN 3.0*     INFECTIOUS  Recent Labs Lab 10/14/15 1255 10/15/15 0331  LATICACIDVEN 0.7  --   PROCALCITON <0.10 <0.10     ENDOCRINE CBG (last 3)   Recent Labs  10/14/15 2322 10/15/15 0316 10/15/15 0839  GLUCAP 109* 128* 129*       IMAGING x48h No results found.    DISCUSSION: 60 year old female past medical history of coronary artery disease, ischemic cardiomyopathy, and polysubstance abuse being  admitted with right thalamic intracranial hemorrhage. She is intubated for airway protection and was transferred to [facility] for ICU admission. Will maintain on ventilator with tight blood pressure control with Cardene infusion. Neurology and neurosurgery consulted.  IVC drain placed early am 6/16.  Poor prognosis.   ASSESSMENT / PLAN:  PULMONARY A: Acute Hypoxemic resp failure 2/2 Unable to Protect Airway - Secondary to [place]. CXR 6/21 Possible LLL infiltrate, possible HCAP.    - doing SBT but mental status precludes extubation. ET Tube appears wtihdrawn P:   Stat cxr for et tube psition PS wean ok but NOT ready for extubation r/t poor mental status  Wean O2 for sats > 90%.   CARDIOVASCULAR A:  Chronic Systolic CHF - EF 123456 w/ grade 1 diastolic dysfunction. RV normal. Hypertension H/O CAD - S/P PCI in 2015 H/O Hyperlipidemia  - still on cleviprex 10/15/15 P:  Tight BP control with SBP goal < 187mmHg will add amlodipine 5mg  daily 6/29 Continue carvedilol Attempt wean cleviprex  Continue clevidipine if needed  per NSGY    RENAL A:   Metabolic Acidosis - resolved    - nil acute   P:   Monitor renal function and UOP Strict I&O Correct electrolytes as indicated Replete K    GASTROINTESTINAL A:   H/O GERD P:   Restart TF  Pepcid IV  HEMATOLOGIC A:   No acute issues. P:  Follow CBC SCDs No chemical DVT ppx given ICH  INFECTIOUS Results for orders placed or performed during the hospital encounter of 09/29/15  Blood culture (routine x 2)     Status: None   Collection Time: 09/29/15  4:43 PM  Result Value Ref Range Status   Specimen Description BLOOD RIGHT ANTECUBITAL  Final   Special Requests BOTTLES DRAWN AEROBIC AND ANAEROBIC 6CC  Final   Culture NO GROWTH 5 DAYS  Final   Report Status 10/04/2015 FINAL  Final  Blood culture (routine x 2)     Status: None   Collection Time: 09/29/15  4:49 PM  Result Value Ref Range Status   Specimen Description BLOOD  LEFT HAND  Final   Special Requests BOTTLES DRAWN AEROBIC AND ANAEROBIC 6CC  Final   Culture NO GROWTH 5 DAYS  Final   Report Status 10/04/2015 FINAL  Final  MRSA PCR Screening     Status: None   Collection Time: 09/30/15  1:27 AM  Result Value Ref Range Status   MRSA by PCR NEGATIVE NEGATIVE Final    Comment:        The GeneXpert MRSA Assay (FDA approved for NASAL specimens only), is one component of a comprehensive MRSA colonization surveillance program. It is not intended to diagnose MRSA infection nor to guide or monitor treatment for MRSA infections.   Culture, Urine     Status: Abnormal   Collection Time: 09/30/15  9:51 AM  Result Value Ref Range Status   Specimen Description URINE, CATHETERIZED  Final   Special Requests none.  Final   Culture 30,000 COLONIES/mL CITROBACTER YOUNGAE (A)  Final   Report Status 10/02/2015 FINAL  Final   Organism ID, Bacteria CITROBACTER YOUNGAE (A)  Final      Susceptibility   Citrobacter youngae - MIC*    CEFAZOLIN <=4 RESISTANT Resistant     CEFTRIAXONE <=1 SENSITIVE Sensitive     CIPROFLOXACIN <=0.25 SENSITIVE Sensitive     GENTAMICIN <=1 SENSITIVE Sensitive     IMIPENEM <=0.25 SENSITIVE Sensitive     NITROFURANTOIN <=16 SENSITIVE Sensitive     TRIMETH/SULFA <=20 SENSITIVE Sensitive     PIP/TAZO <=4 SENSITIVE Sensitive     * 30,000 COLONIES/mL CITROBACTER YOUNGAE  Culture, blood (routine x 2)     Status: None   Collection Time: 10/04/15  4:15 PM  Result Value Ref Range Status   Specimen Description BLOOD LEFT HAND  Final   Special Requests IN PEDIATRIC BOTTLE 3CC  Final   Culture NO GROWTH 5 DAYS  Final   Report Status 10/09/2015 FINAL  Final  Culture, respiratory (NON-Expectorated)     Status: None   Collection Time: 10/04/15  4:22 PM  Result Value Ref Range Status   Specimen Description TRACHEAL ASPIRATE  Final   Special Requests Normal  Final   Gram Stain   Final    FEW WBC PRESENT,BOTH PMN AND MONONUCLEAR NO ORGANISMS  SEEN    Culture Consistent with normal respiratory flora.  Final   Report Status 10/07/2015 FINAL  Final  Culture, blood (routine x 2)     Status: None  Collection Time: 10/04/15  4:25 PM  Result Value Ref Range Status   Specimen Description BLOOD RIGHT HAND  Final   Special Requests BOTTLES DRAWN AEROBIC AND ANAEROBIC 5CC  Final   Culture NO GROWTH 5 DAYS  Final   Report Status 10/09/2015 FINAL  Final  CSF culture with Stat gram stain     Status: None   Collection Time: 10/04/15  5:36 PM  Result Value Ref Range Status   Specimen Description CSF  Final   Special Requests SHUNT  Final   Gram Stain   Final    FEW WBC PRESENT,BOTH PMN AND MONONUCLEAR NO ORGANISMS SEEN    Culture NO GROWTH 3 DAYS  Final   Report Status 10/08/2015 FINAL  Final    A:   H/O HSV Infection  Persistent fever. CXR with possible LLL infiltrate (possible LLL HCAP); fever still ongoing but PCT and lactate noram  P:   Check pct and lactate 10/15/2015  Antibiotics Given (last 72 hours)    Date/Time Action Medication Dose Rate   10/12/15 1415 Given   piperacillin-tazobactam (ZOSYN) IVPB 3.375 g 3.375 g 12.5 mL/hr   10/12/15 2126 Given   piperacillin-tazobactam (ZOSYN) IVPB 3.375 g 3.375 g 12.5 mL/hr   10/13/15 0549 Given   piperacillin-tazobactam (ZOSYN) IVPB 3.375 g 3.375 g 12.5 mL/hr   10/13/15 1332 Given   piperacillin-tazobactam (ZOSYN) IVPB 3.375 g 3.375 g 12.5 mL/hr   10/13/15 2204 Given   piperacillin-tazobactam (ZOSYN) IVPB 3.375 g 3.375 g 12.5 mL/hr   10/14/15 0525 Given   piperacillin-tazobactam (ZOSYN) IVPB 3.375 g 3.375 g 12.5 mL/hr   10/14/15 2130 Given   piperacillin-tazobactam (ZOSYN) IVPB 3.375 g 3.375 g 12.5 mL/hr   10/15/15 0553 Given   piperacillin-tazobactam (ZOSYN) IVPB 3.375 g 3.375 g 12.5 mL/hr        ENDOCRINE A:   No acute issues P:   CBG's Q 4 hours SSI Follow glucose on BMP  NEUROLOGIC A:   Right Thalamic Intracranial Hemorrhage s/p IVC 6/16 Cocaine Abuse   H/O EtOH Abuse H/O Depression  Following commands on the right side only.- an improvement 10/13/15. Unchagned 10/14/15 and 10/15/15   P:  Sedation off  RASS goal: 0 but not achivevable prognostically Neurosurgery assistance appreciated, drain management as per their recs.  Blood pressure control as above. Poor prognosis   FAMILY  - Updates: no family available 6/29 and 10/14/15. Fiance updated 10/15/15      The patient is critically ill with multiple organ systems failure and requires high complexity decision making for assessment and support, frequent evaluation and titration of therapies, application of advanced monitoring technologies and extensive interpretation of multiple databases.   Critical Care Time devoted to patient care services described in this note is  30  Minutes. This time reflects time of care of this signee Dr Brand Males. This critical care time does not reflect procedure time, or teaching time or supervisory time of PA/NP/Med student/Med Resident etc but could involve care discussion time    Dr. Brand Males, M.D., [facility].C.P Pulmonary and Critical Care Medicine Staff Physician [place] [facility] Pager: [phone], If no answer or between  15:00h - 7:00h: call [phone]  10/15/2015 11:04 AM

## 2015-10-16 LAB — GLUCOSE, CAPILLARY
GLUCOSE-CAPILLARY: 106 mg/dL — AB (ref 65–99)
GLUCOSE-CAPILLARY: 117 mg/dL — AB (ref 65–99)
GLUCOSE-CAPILLARY: 136 mg/dL — AB (ref 65–99)
Glucose-Capillary: 123 mg/dL — ABNORMAL HIGH (ref 65–99)
Glucose-Capillary: 117 mg/dL — ABNORMAL HIGH (ref 65–99)
Glucose-Capillary: 134 mg/dL — ABNORMAL HIGH (ref 65–99)

## 2015-10-16 LAB — C DIFFICILE QUICK SCREEN W PCR REFLEX
C DIFFICILE (CDIFF) INTERP: NEGATIVE
C DIFFICLE (CDIFF) ANTIGEN: NEGATIVE
C Diff toxin: NEGATIVE

## 2015-10-16 LAB — BASIC METABOLIC PANEL
ANION GAP: 10 (ref 5–15)
BUN: 21 mg/dL — AB (ref 6–20)
CHLORIDE: 109 mmol/L (ref 101–111)
CO2: 24 mmol/L (ref 22–32)
Calcium: 9.5 mg/dL (ref 8.9–10.3)
Creatinine, Ser: 0.98 mg/dL (ref 0.44–1.00)
GFR calc Af Amer: >60 mL/min (ref >60)
Glucose, Bld: 126 mg/dL — ABNORMAL HIGH (ref 65–99)
POTASSIUM: 3.2 mmol/L — AB (ref 3.5–5.1)
SODIUM: 143 mmol/L (ref 135–145)

## 2015-10-16 LAB — PROCALCITONIN

## 2015-10-16 LAB — MAGNESIUM: MAGNESIUM: 2.5 mg/dL — AB (ref 1.7–2.4)

## 2015-10-16 LAB — PHOSPHORUS: PHOSPHORUS: 4.5 mg/dL (ref 2.5–4.6)

## 2015-10-16 MED ORDER — METRONIDAZOLE 50 MG/ML ORAL SUSPENSION
500.0000 mg | Freq: Three times a day (TID) | ORAL | Status: DC
  Filled 2015-10-16 (×2): qty 10

## 2015-10-16 MED ORDER — AMLODIPINE BESYLATE 10 MG PO TABS
10.0000 mg | ORAL_TABLET | Freq: Every day | ORAL | Status: DC
  Administered 2015-10-17 – 2015-11-09 (×23): 10 mg
  Filled 2015-10-16 (×24): qty 1

## 2015-10-16 MED ORDER — AMLODIPINE BESYLATE 5 MG PO TABS
5.0000 mg | ORAL_TABLET | Freq: Every day | ORAL | Status: DC
Start: 2015-10-16 — End: 2015-10-16
  Administered 2015-10-16: 5 mg
  Filled 2015-10-16: qty 1

## 2015-10-16 MED ORDER — FAMOTIDINE 40 MG/5ML PO SUSR
20.0000 mg | Freq: Every day | ORAL | Status: DC
  Administered 2015-10-16 – 2015-11-09 (×25): 20 mg
  Filled 2015-10-16 (×24): qty 2.5

## 2015-10-16 MED ORDER — POTASSIUM CHLORIDE 20 MEQ/15ML (10%) PO SOLN
30.0000 meq | ORAL | Status: AC
  Administered 2015-10-16 (×2): 30 meq
  Filled 2015-10-16 (×2): qty 30

## 2015-10-16 NOTE — Progress Notes (Addendum)
PULMONARY / CRITICAL CARE MEDICINE   Name: ESTEBAN SALERA MRN: [id number] DOB: Mar 04, 1956    ADMISSION DATE:  09/29/2015 CONSULTATION DATE:  09/29/2015  REFERRING MD:  Ezequiel Essex, M.D. / [facility] EDP  CHIEF COMPLAINT:  Altered mental status  BRIEF   60 year old female past medical history as below, which is significant for hypertension, alcohol abuse, cocaine abuse, HSV, coronary artery disease status post stents to RCA and LAD in 2015, and ischemic cardiomyopathy with LV EF 35-40%. She presented to [facility] emergency department 6/15 with altered mental status. Reportedly the night before she suffered a fall during the night, and since that time has been sleeping all day. She was brought to the emergency department with this complaint. In emergency department she was unresponsive but reportedly breathing adequately. She had no spontaneous movement and left gaze deviation, but did have a positive gag reflex. CT scan of the head was obtained and demonstrated acute intracranial hemorrhage with intraventricular extension. Cervical spine was without radiographic evidence of injury. She was intubated for airway protection with anticipation of transfer to [facility]. PCCM accepting.   MICROBIOLOGY: MRSA PCR 6/15 >> (-) Blood Ctx x2 6/15 >> (-) Blood 6/20 > (-) Sputum 6/20 > (-) Urine 6/616- Citrobacter Urine 6/20 > (-)     ANTIBIOTICS: 6/15 Rocephin >>6/18 6/16 Acef (prophylactic abx) >> 6/18 Zosyn 6/20 > 7/2 (ONGOING FEVERS AND ? DRUG FEVER) Vanc 6/20 > 6/22     STUDIES:    LINES/TUBES: OETT 7.5 6/15 >> 6/24 replaced 6/24>> Foley 6/15 >> OGT 6/15 >> 6/24 PIV X3 IVC Drain 6/16 >>    SIGNIFICANT EVENTS: 6/15 - Admit to ICU on ventilator for ICH after [facility] from [facility] CT Head W/O 6/15: Acute intracranial hemorrhage with intraventricular extension and possible developing hydrocephalus Although no fractures identified in the cervical spine, the evaluation of the  cervical spine is limited. CT Neck 6/15: Limited evaluation of cervical spine due to motion. No fractures evident. Port CXR 6/15: Silhouetting left hemidiaphragm. Endotracheal tube tip at the level of the carina to nearly entering right mainstem bronchus. Orogastric tube passing below diaphragm. Port CXR 6/16>>ETT 4.5 cm above carina. Atelectasis in lung bases CT head 6/19 > no significant change since compared to prior scan. CT Head  10/04/2015 >>Degenerating RIGHT thalamus hematoma with local mass effect and edema. Similar mild hydrocephalus, stable RIGHT frontal ventriculostomy catheter. Degenerating intraventricular blood products.    6/25 Extubated successfully - re intubated   6/28: no sig change overnight.  CT stable.  Unable to wean cleviprex for HTN.   6/29: ongoing fevers withotu change in WBC. Moving right side. Doing SBt. Left side only flicker to pain occ per RN. Still on cleviprex - rn says new bp goal per NSGY is sbp < 140. Doing PSVT   10/15/15- per neurosurgery patient was brought per neurosurgery patient withdraws to stimulation intermittently and follows commands. X-ray ventricular drain appears to be functioning well. The plan is to possibly remove the drain on 10/17/2015. Ongoing fvers but biomarkers normal   SUBJECTIVE/OVERNIGHT/INTERVAL HX 10/16/15 - NO CHANGE. EVD change 10/17/15. Still with fevers but PCT normal (on Zosyn). Off cleviprex but RN says night bp med needed   VITAL SIGNS: BP 99/49 mmHg  Pulse 87  Temp(Src) 102.8 F (39.3 C) (Oral)  Resp 21  Ht 5\' 3"  (1.6 m)  Wt 47.2 kg (104 lb 0.9 oz)  BMI 18.44 kg/m2  SpO2 100%  HEMODYNAMICS:    VENTILATOR SETTINGS: Vent Mode:  [-]  PSV;CPAP FiO2 (%):  [30 %] 30 % Set Rate:  [16 bmp] 16 bmp Vt Set:  [420 mL] 420 mL PEEP:  [5 cmH20] 5 cmH20 Pressure Support:  [8 cmH20] 8 cmH20 Plateau Pressure:  [10 cmH20-13 cmH20] 13 cmH20  INTAKE / OUTPUT: I/O last 3 completed shifts: In: 1942 [I.V.:122; NG/GT:1620;  IV Piggyback:200] Out: [id number] [Urine:650; Drains:337; Stool:350]  PHYSICAL EXAMINATION: General:  Ill appearing female, NAD with IVC drain.  Integument:  Warm & dry. No rash on exposed skin.  HEENT:  Drain in place. No scleral injection or icterus. no significant oral secretions. Pupils 3mm, NR Cardiovascular:  RRR, no M/R/G. Pulmonary:  resps even non labored on PS 8/5, Coarse bilaterally.   Abdomen: Soft. Normal bowel sounds. Nondistended.  Musculoskeletal:  Normal bulk. No joint deformity or effusion appreciated. Neurological: NO sedation, moves RLE spontaneously, ?some purposeful movements per nursing but does not follow commands.  LABS: PULMONARY  Recent Labs Lab 10/09/15 1808  PHART 7.435  PCO2ART 39.0  PO2ART 163.0*  HCO3 26.2*  TCO2 27  O2SAT 100.0    CBC  Recent Labs Lab 10/12/15 0500 10/13/15 0622 10/14/15 0400  HGB 9.4* 9.2* 8.5*  HCT 27.6* 28.5* 26.8*  WBC 7.5 7.9 6.5  PLT 346 352 381    COAGULATION No results for input(s): INR in the last 168 hours.  CARDIAC  No results for input(s): TROPONINI in the last 168 hours. No results for input(s): PROBNP in the last 168 hours.   CHEMISTRY  Recent Labs Lab 10/12/15 0500 10/13/15 0622 10/14/15 0400 10/15/15 0331 10/16/15 0356  NA 140 143 140 142 143  K 3.9 3.1* 3.6 3.1* 3.2*  CL 105 109 107 111 109  CO2 23 26 24 24 24   GLUCOSE 115* 127* 123* 127* 126*  BUN 25* 21* 17 18 21*  CREATININE 0.90 0.86 0.68 0.79 0.98  CALCIUM 9.3 9.4 9.5 9.6 9.5  MG 2.4 2.4 2.2 2.5* 2.5*  PHOS 3.2 4.6 3.4 4.3 4.5   Estimated Creatinine Clearance: 45.5 mL/min (by C-G formula based on Cr of 0.98).   LIVER  Recent Labs Lab 10/15/15 0331  AST 25  ALT 46  ALKPHOS 35*  BILITOT 0.2*  PROT 7.8  ALBUMIN 3.0*     INFECTIOUS  Recent Labs Lab 10/14/15 1255 10/15/15 0331 10/16/15 0356  LATICACIDVEN 0.7  --   --   PROCALCITON <0.10 <0.10 <0.10     ENDOCRINE CBG (last 3)   Recent Labs  10/16/15 0405  10/16/15 0805 10/16/15 1128  GLUCAP 123* 117* 136*       IMAGING x48h Dg Chest Port 1 View  10/15/2015  CLINICAL DATA:  End of battery life of intrathecal infusion pump. EXAM: PORTABLE CHEST 1 VIEW COMPARISON:  10/11/2015 chest radiograph. FINDINGS: Endotracheal tube tip is 3.9 cm above the carina. Enteric tube enters stomach with the tip not seen on this image. Stable cardiomediastinal silhouette with top-normal heart size. No pneumothorax. No pleural effusion. No pulmonary edema. Mild patchy bibasilar lung opacities appear decreased. IMPRESSION: 1. Support structures as described . 2. Improved lung volumes with decreased mild patchy bibasilar lung opacities, favor atelectasis. Electronically Signed   By: Ilona Sorrel M.D.   On: 10/15/2015 11:32      DISCUSSION: 60 year old female past medical history of coronary artery disease, ischemic cardiomyopathy, and polysubstance abuse being admitted with right thalamic intracranial hemorrhage. She is intubated for airway protection and was transferred to [facility] for ICU admission. Will maintain on ventilator with tight  blood pressure control with Cardene infusion. Neurology and neurosurgery consulted.  IVC drain placed early am 6/16.  Poor prognosis.   ASSESSMENT / PLAN:  PULMONARY A: Acute Hypoxemic resp failure 2/2 Unable to Protect Airway - Secondary to [place]. CXR 6/21 Possible LLL infiltrate, possible HCAP.    -10/16/15:  doing SBT but mental status precludes extubation.  P:   PS wean ok but NOT ready for extubation r/t poor mental status  Wean O2 for sats > 90%.   CARDIOVASCULAR A:  Chronic Systolic CHF - EF 123456 w/ grade 1 diastolic dysfunction. RV normal. Hypertension H/O CAD - S/P PCI in 2015 H/O Hyperlipidemia  - off  cleviprex 10/16/15 P:  Tight BP control with SBP goal < 128mmHg amlodipine 5mg  daily 6/29 bu change on 10/16/15 to 10mg  qhs Continue carvedilol   RENAL A:   Metabolic Acidosis - resolved    - nil  acute   P:   Monitor renal function and UOP Strict I&O Correct electrolytes as indicated   GASTROINTESTINAL A:   H/O GERD  - on TF and ppi - RN reports diarrhea x 1 week on 78/2/17  P:    TF  chagne ppi to h2 blockade Check C diff - 7./2/17 - see ID section  HEMATOLOGIC A:   No acute issues. P:  Follow CBC SCDs No chemical DVT ppx given ICH  INFECTIOUS   A:   H/O HSV Infection  7/2/17P - Persistent fever. CXR with possible LLL infiltrate (possible LLL HCAP); fever still ongoing but PCT and lactate normal. On zosyn since 10/04/15. Having diarrhea  P:   Check C diff - empiric flagyl Rx DC zosynr   ENDOCRINE A:   No acute issues P:   CBG's Q 4 hours SSI Follow glucose on BMP  NEUROLOGIC A:   Right Thalamic Intracranial Hemorrhage s/p IVC 6/16 Cocaine Abuse  H/O EtOH Abuse H/O Depression  Following commands on the right side only.- an improvement 10/13/15. Unchagned 10/14/15 and 10/15/15 and7/2/17   P:  Sedation off  RASS goal: 0 but not achivevable prognostically Neurosurgery assistance appreciated, drain management as per their recs.  Blood pressure control as above. Poor prognosis   FAMILY  - Updates: no family available 6/29 and 10/14/15. Fiance updated 10/15/15 and 10/16/15   GLOBAL - on week of 10/17/15 - need to discuss with nsgy about peg/trach v ltac      The patient is critically ill with multiple organ systems failure and requires high complexity decision making for assessment and support, frequent evaluation and titration of therapies, application of advanced monitoring technologies and extensive interpretation of multiple databases.   Critical Care Time devoted to patient care services described in this note is  30  Minutes. This time reflects time of care of this signee Dr Brand Males. This critical care time does not reflect procedure time, or teaching time or supervisory time of PA/NP/Med student/Med Resident etc but could involve  care discussion time    Dr. Brand Males, M.D., [facility].C.P Pulmonary and Critical Care Medicine Staff Physician [place] [facility] Pager: [phone], If no answer or between  15:00h - 7:00h: call [phone]  10/16/2015 1:14 PM

## 2015-10-16 NOTE — Progress Notes (Signed)
[facility] ADULT ICU REPLACEMENT PROTOCOL FOR AM LAB REPLACEMENT ONLY  The patient does apply for the [facility] Adult ICU Electrolyte Replacment Protocol based on the criteria listed below:   1. Is GFR >/= 40 ml/min? Yes.    Patient's GFR today is >60 2. Is urine output >/= 0.5 ml/kg/hr for the last 6 hours? Yes.   Patient's UOP is 0.95 ml/kg/hr 3. Is BUN < 60 mg/dL? Yes.    Patient's BUN today is 21 4. Abnormal electrolyte(s): K=3.2 5. Ordered repletion with: 42meq K per tube Q4H for 2 doses 6. If a panic level lab has been reported, has the CCM MD in charge been notified? No..   Physician: Dr. Jamelle Haring, [place] [facility] 10/16/2015 5:36 AM

## 2015-10-16 NOTE — Progress Notes (Signed)
Patient ID: Joanna Farmer, female   DOB: 04/03/1956, 60 y.o.   MRN: [id number] No significant change neurologically does not follow commands withdraws in the right  EVD functioning well  Dr. Desiree Lucy plan to challenge EVD in the morning.

## 2015-10-17 ENCOUNTER — Inpatient Hospital Stay (HOSPITAL_COMMUNITY): Payer: Medicaid Other

## 2015-10-17 DIAGNOSIS — G911 Obstructive hydrocephalus: Secondary | ICD-10-CM

## 2015-10-17 LAB — MAGNESIUM: Magnesium: 2.5 mg/dL — ABNORMAL HIGH (ref 1.7–2.4)

## 2015-10-17 LAB — GLUCOSE, CAPILLARY
Glucose-Capillary: 117 mg/dL — ABNORMAL HIGH (ref 65–99)
Glucose-Capillary: 121 mg/dL — ABNORMAL HIGH (ref 65–99)
Glucose-Capillary: 128 mg/dL — ABNORMAL HIGH (ref 65–99)
Glucose-Capillary: 128 mg/dL — ABNORMAL HIGH (ref 65–99)
Glucose-Capillary: 129 mg/dL — ABNORMAL HIGH (ref 65–99)
Glucose-Capillary: 139 mg/dL — ABNORMAL HIGH (ref 65–99)

## 2015-10-17 LAB — BASIC METABOLIC PANEL
ANION GAP: 20 — AB (ref 5–15)
BUN: 21 mg/dL — AB (ref 6–20)
CALCIUM: 11.2 mg/dL — AB (ref 8.9–10.3)
CO2: 23 mmol/L (ref 22–32)
CREATININE: 0.68 mg/dL (ref 0.44–1.00)
Chloride: 107 mmol/L (ref 101–111)
GFR calc non Af Amer: >60 mL/min (ref >60)
Glucose, Bld: 129 mg/dL — ABNORMAL HIGH (ref 65–99)
POTASSIUM: 3.7 mmol/L (ref 3.5–5.1)
SODIUM: 150 mmol/L — AB (ref 135–145)

## 2015-10-17 LAB — PHOSPHORUS: PHOSPHORUS: 3.7 mg/dL (ref 2.5–4.6)

## 2015-10-17 MED ORDER — FREE WATER
250.0000 mL | Freq: Four times a day (QID) | Status: DC
  Administered 2015-10-17 – 2015-10-23 (×23): 250 mL

## 2015-10-17 NOTE — Progress Notes (Signed)
PULMONARY / CRITICAL CARE MEDICINE   Name: Joanna Farmer MRN: [id number] DOB: [birth year]    ADMISSION DATE:  09/29/2015 CONSULTATION DATE:  09/29/2015  REFERRING MD:  Ezequiel Essex, M.D. / [facility]  CHIEF COMPLAINT:  Altered mental status, thalamic bleed.  BRIEF   60 year old female past medical history as below, which is significant for hypertension, alcohol abuse, cocaine abuse, HSV, coronary artery disease status post stents to RCA and LAD in 2015, and ischemic cardiomyopathy with LV EF 35-40%. She presented to [facility] emergency department 6/15 with altered mental status. Reportedly the night before she suffered a fall during the night, and since that time has been sleeping all day. She was brought to the emergency department with this complaint. In emergency department she was unresponsive but reportedly breathing adequately. She had no spontaneous movement and left gaze deviation, but did have a positive gag reflex. CT scan of the head was obtained and demonstrated acute intracranial hemorrhage with intraventricular extension. Cervical spine was without radiographic evidence of injury. She was intubated for airway protection with anticipation of transfer to [facility]. PCCM accepting.  MICROBIOLOGY: MRSA PCR 6/15 >> (-) Blood Ctx x2 6/15 >> (-) Blood 6/20 > (-) Sputum 6/20 > (-) Urine 6/616- Citrobacter Urine 6/20 > (-)  ANTIBIOTICS: 6/15 Rocephin >>6/18 6/16 Acef (prophylactic abx) >> 6/18 Zosyn 6/20 > 7/2 (ONGOING FEVERS AND ? DRUG FEVER) Vanc 6/20 > 6/22  STUDIES:    LINES/TUBES: OETT 7.5 6/15 >> 6/24 replaced 6/24>> Foley 6/15 >> OGT 6/15 >> 6/24 PIV X3 IVC Drain 6/16 >>   SIGNIFICANT EVENTS: 6/15 - Admit to ICU on ventilator for ICH after transfer from [facility] ED CT Head W/O 6/15: Acute intracranial hemorrhage with intraventricular extension and possible developing hydrocephalus Although no fractures identified in the cervical spine, the evaluation of the  cervical spine is limited. CT Neck 6/15: Limited evaluation of cervical spine due to motion. No fractures evident. Port CXR 6/15: Silhouetting left hemidiaphragm. Endotracheal tube tip at the level of the carina to nearly entering right mainstem bronchus. Orogastric tube passing below diaphragm. Port CXR 6/16>>ETT 4.5 cm above carina. Atelectasis in lung bases CT head 6/19 > no significant change since compared to prior scan. CT Head  10/04/2015 >>Degenerating RIGHT thalamus hematoma with local mass effect and edema. Similar mild hydrocephalus, stable RIGHT frontal ventriculostomy catheter. Degenerating intraventricular blood products.   6/25 Extubated successfully - re intubated   6/28: no sig change overnight.  CT stable.  Unable to wean cleviprex for HTN.   6/29: ongoing fevers withotu change in WBC. Moving right side. Doing SBt. Left side only flicker to pain occ per RN. Still on cleviprex - rn says new bp goal per NSGY is sbp < 140. Doing PSVT  10/15/15- per neurosurgery patient was brought per neurosurgery patient withdraws to stimulation intermittently and follows commands. X-ray ventricular drain appears to be functioning well. The plan is to possibly remove the drain on 10/17/2015. Ongoing fvers but biomarkers normal  SUBJECTIVE/OVERNIGHT/INTERVAL HX Patient following some commands this AM but clearly encephalopathic.   VITAL SIGNS: BP 147/77 mmHg  Pulse 80  Temp(Src) 100.1 F (37.8 C) (Oral)  Resp 17  Ht 5\' 3"  (1.6 m)  Wt 46 kg (101 lb 6.6 oz)  BMI 17.97 kg/m2  SpO2 100%  HEMODYNAMICS:    VENTILATOR SETTINGS: Vent Mode:  [-] PSV;CPAP FiO2 (%):  [30 %] 30 % Set Rate:  [16 bmp] 16 bmp Vt Set:  [420 mL] 420 mL  PEEP:  [5 cmH20] 5 cmH20 Pressure Support:  [5 cmH20-8 cmH20] 5 cmH20 Plateau Pressure:  [10 cmH20-11 cmH20] 11 cmH20  INTAKE / OUTPUT: I/O last 3 completed shifts: In: 1720 [NG/GT:1620; IV Piggyback:100] Out: 2893 [Urine:1840; Drains:303;  Stool:750]  PHYSICAL EXAMINATION: General:  Ill appearing female, NAD with IVC drain.  Integument:  Warm & dry. No rash on exposed skin.  HEENT:  Drain in place. No scleral injection or icterus. no significant oral secretions. Pupils 26mm, NR Cardiovascular:  RRR, no M/R/G and SEM at LUSB. Pulmonary:  Resps even non labored on PS 5/5, Coarse bilaterally.   Abdomen: Soft. Normal bowel sounds. Nondistended.  Musculoskeletal:  Normal bulk. No joint deformity or effusion appreciated. Neurological: NO sedation, moves RLE spontaneously, ?some purposeful movements per nursing but does not follow commands.  LABS: PULMONARY No results for input(s): PHART, PCO2ART, PO2ART, HCO3, TCO2, O2SAT in the last 168 hours.  Invalid input(s): PCO2, PO2  CBC  Recent Labs Lab 10/12/15 0500 10/13/15 0622 10/14/15 0400  HGB 9.4* 9.2* 8.5*  HCT 27.6* 28.5* 26.8*  WBC 7.5 7.9 6.5  PLT 346 352 381   COAGULATION No results for input(s): INR in the last 168 hours.  CARDIAC  No results for input(s): TROPONINI in the last 168 hours. No results for input(s): PROBNP in the last 168 hours.  CHEMISTRY  Recent Labs Lab 10/13/15 0622 10/14/15 0400 10/15/15 0331 10/16/15 0356 10/17/15 0337  NA 143 140 142 143 150*  K 3.1* 3.6 3.1* 3.2* 3.7  CL 109 107 111 109 107  CO2 26 24 24 24 23   GLUCOSE 127* 123* 127* 126* 129*  BUN 21* 17 18 21* 21*  CREATININE 0.86 0.68 0.79 0.98 0.68  CALCIUM 9.4 9.5 9.6 9.5 11.2*  MG 2.4 2.2 2.5* 2.5* 2.5*  PHOS 4.6 3.4 4.3 4.5 3.7   Estimated Creatinine Clearance: 54.3 mL/min (by C-G formula based on Cr of 0.68).  LIVER  Recent Labs Lab 10/15/15 0331  AST 25  ALT 46  ALKPHOS 35*  BILITOT 0.2*  PROT 7.8  ALBUMIN 3.0*   INFECTIOUS  Recent Labs Lab 10/14/15 1255 10/15/15 0331 10/16/15 0356  LATICACIDVEN 0.7  --   --   PROCALCITON <0.10 <0.10 <0.10   ENDOCRINE CBG (last 3)   Recent Labs  10/17/15 0020 10/17/15 0329 10/17/15 0727  GLUCAP 129* 128*  117*   IMAGING x48h Dg Chest Port 1 View  10/17/2015  CLINICAL DATA:  Intubated patient EXAM: PORTABLE CHEST 1 VIEW COMPARISON:  None in PACs FINDINGS: The lungs are adequately inflated. There is subtle increased alveolar density just above the right lateral costophrenic angle. The cardiac silhouette is normal in size. The pulmonary vascularity is not engorged. There is calcification in the wall of the aortic arch. The endotracheal tube tip lies 3.8 cm above the carina. The feeding tube tip lies in the region of the pylorus or proximal duodenum. There is coiling of much of the feeding tube in the stomach which may place the patient at risk for knotting of the tube. IMPRESSION: Right basilar atelectasis or subtle infiltrate. Otherwise no acute cardiopulmonary abnormality. The support tubes are in reasonable position. See the discussion above regarding the coiling of the feeding tube within the stomach. There is aortic atherosclerosis. Electronically Signed   By: David  Martinique M.D.   On: 10/17/2015 07:32   Dg Chest Port 1 View  10/15/2015  CLINICAL DATA:  End of battery life of intrathecal infusion pump. EXAM: PORTABLE CHEST 1 VIEW  COMPARISON:  10/11/2015 chest radiograph. FINDINGS: Endotracheal tube tip is 3.9 cm above the carina. Enteric tube enters stomach with the tip not seen on this image. Stable cardiomediastinal silhouette with top-normal heart size. No pneumothorax. No pleural effusion. No pulmonary edema. Mild patchy bibasilar lung opacities appear decreased. IMPRESSION: 1. Support structures as described . 2. Improved lung volumes with decreased mild patchy bibasilar lung opacities, favor atelectasis. Electronically Signed   By: Ilona Sorrel M.D.   On: 10/15/2015 11:32   DISCUSSION: 60 year old female past medical history of coronary artery disease, ischemic cardiomyopathy, and polysubstance abuse being admitted with right thalamic intracranial hemorrhage. She is intubated for airway protection and  was transferred to [facility] for ICU admission. Will maintain on ventilator with tight blood pressure control with Cardene infusion. Neurology and neurosurgery consulted.  IVC drain placed early am 6/16.  Poor prognosis.   ASSESSMENT / PLAN:  PULMONARY A: Acute Hypoxemic resp failure 2/2 Unable to Protect Airway - Secondary to [place]. CXR 6/21 Possible LLL infiltrate, possible HCAP.    -10/16/15:  doing SBT but mental status precludes extubation.  P:   PS wean as able, no extubation given mental status. Wean O2 for sats > 90%. Spoke with family, they rea discussing trach and anticipate will be done later on this week.  CARDIOVASCULAR A:  Chronic Systolic CHF - EF 123456 w/ grade 1 diastolic dysfunction. RV normal. Hypertension H/O CAD - S/P PCI in 2015 H/O Hyperlipidemia  P:  Tight BP control with SBP goal < 144mmHg Amlodipine 5mg  daily 6/29 changed on 10/16/15 to 10mg  qhs, will continue. Continue carvedilol  RENAL A:   Metabolic Acidosis - resolved Hypernatremia.  P:   Monitor renal function and UOP. Strict I&O. Correct electrolytes as indicated. Free water 250 ml q6 hours.  GASTROINTESTINAL A:   H/O GERD On TF and ppi RN reports diarrhea x 1 week on 78/2/17  P:   TF  Chagne ppi to h2 blockade Check C diff - 7./2/17 - see ID section  HEMATOLOGIC A:   No acute issues. P:  Follow CBC SCDs No chemical DVT ppx given ICH  INFECTIOUS A:   H/O HSV Infection  7/2/17P - Persistent fever. CXR with possible LLL infiltrate (possible LLL HCAP); fever still ongoing but PCT and lactate normal. On zosyn since 10/04/15. Having diarrhea  P:   C diff negative. DCed zosyn  ENDOCRINE A:   No acute issues P:   CBG's Q 4 hours SSI Follow glucose on BMP  NEUROLOGIC A:   Right Thalamic Intracranial Hemorrhage s/p IVC 6/16 Cocaine Abuse  H/O EtOH Abuse H/O Depression  Following commands on the right side only.  P:  Sedation off  RASS goal: 0 but not achivevable  prognostically Neurosurgery assistance appreciated, drain management as per their recs.  Blood pressure control as above. Poor prognosis   FAMILY  - Updates: Spoke with 2 daughters present and one son over the phone, they are discussing trach/peg.  GLOBAL - on week of 10/17/15 - need to discuss with nsgy about peg/trach v ltac  The patient is critically ill with multiple organ systems failure and requires high complexity decision making for assessment and support, frequent evaluation and titration of therapies, application of advanced monitoring technologies and extensive interpretation of multiple databases.   Critical Care Time devoted to patient care services described in this note is  35  Minutes. This time reflects time of care of this signee Dr Jennet Maduro. This critical care time does  not reflect procedure time, or teaching time or supervisory time of PA/NP/Med student/Med Resident etc but could involve care discussion time.  [name], M.D. [facility] Pulmonary/Critical Care Medicine. Pager: [phone]. After hours pager: [phone].  10/17/2015 9:36 AM

## 2015-10-17 NOTE — Progress Notes (Signed)
[facility] Progress Note Patient Name: [name] DOB: [birth year] MRN: [id number]   Date of Service  10/17/2015  HPI/Events of Note  Request to renew R wrist restraint.   eICU Interventions  Will renew R wrist restraint.     Intervention Category Minor Interventions: Agitation / anxiety - evaluation and management  Sommer,Steven Eugene 10/17/2015, 11:32 PM

## 2015-10-17 NOTE — Progress Notes (Signed)
No issues overnight. Remains intubated on vent.  EXAM:  BP 146/54 mmHg  Pulse 73  Temp(Src) 99.5 F (37.5 C) (Oral)  Resp 13  Ht 5\' 3"  (1.6 m)  Wt 46 kg (101 lb 6.6 oz)  BMI 17.97 kg/m2  SpO2 100%  Easily arousable to voice Follows commands briskly RUE/RLE W/D LLE/LUE EVD in place, functioning normally  IMPRESSION:  60 y.o. female s/p right thalamic hemorrhage. Appears to have improved somewhat over the weekend.   PLAN: - Can attempt to wean EVD tomorrow if she remains stable - May need trach for VDRF

## 2015-10-17 NOTE — Progress Notes (Signed)
Left message for pt's daughter, Ethlyn Daniels, [phone]) at her request to speak with case manager.    Reinaldo Raddle, RN, BSN  Trauma/Neuro ICU Case Manager [phone]

## 2015-10-18 ENCOUNTER — Inpatient Hospital Stay (HOSPITAL_COMMUNITY): Payer: Medicaid Other

## 2015-10-18 LAB — GLUCOSE, CAPILLARY
GLUCOSE-CAPILLARY: 126 mg/dL — AB (ref 65–99)
GLUCOSE-CAPILLARY: 143 mg/dL — AB (ref 65–99)
Glucose-Capillary: 117 mg/dL — ABNORMAL HIGH (ref 65–99)
Glucose-Capillary: 120 mg/dL — ABNORMAL HIGH (ref 65–99)
Glucose-Capillary: 121 mg/dL — ABNORMAL HIGH (ref 65–99)
Glucose-Capillary: 127 mg/dL — ABNORMAL HIGH (ref 65–99)
Glucose-Capillary: 135 mg/dL — ABNORMAL HIGH (ref 65–99)

## 2015-10-18 LAB — POCT I-STAT 3, ART BLOOD GAS (G3+)
ACID-BASE DEFICIT: 1 mmol/L (ref 0.0–2.0)
Bicarbonate: 23.1 mEq/L (ref 20.0–24.0)
O2 SAT: 99 %
PH ART: 7.441 (ref 7.350–7.450)
Patient temperature: 99.1
TCO2: 24 mmol/L (ref 0–100)
pCO2 arterial: 34 mmHg — ABNORMAL LOW (ref 35.0–45.0)
pO2, Arterial: 123 mmHg — ABNORMAL HIGH (ref 80.0–100.0)

## 2015-10-18 LAB — BASIC METABOLIC PANEL
Anion gap: 8 (ref 5–15)
BUN: 23 mg/dL — ABNORMAL HIGH (ref 6–20)
CALCIUM: 9.9 mg/dL (ref 8.9–10.3)
CHLORIDE: 113 mmol/L — AB (ref 101–111)
CO2: 23 mmol/L (ref 22–32)
CREATININE: 0.65 mg/dL (ref 0.44–1.00)
Glucose, Bld: 128 mg/dL — ABNORMAL HIGH (ref 65–99)
Potassium: 3.7 mmol/L (ref 3.5–5.1)
SODIUM: 144 mmol/L (ref 135–145)

## 2015-10-18 LAB — CBC
HCT: 27.3 % — ABNORMAL LOW (ref 36.0–46.0)
HEMOGLOBIN: 8.7 g/dL — AB (ref 12.0–15.0)
MCH: 31.8 pg (ref 26.0–34.0)
MCHC: 31.9 g/dL (ref 30.0–36.0)
MCV: 99.6 fL (ref 78.0–100.0)
PLATELETS: 307 10*3/uL (ref 150–400)
RBC: 2.74 MIL/uL — ABNORMAL LOW (ref 3.87–5.11)
RDW: 14.2 % (ref 11.5–15.5)
WBC: 6.7 10*3/uL (ref 4.0–10.5)

## 2015-10-18 LAB — PHOSPHORUS: PHOSPHORUS: 4.4 mg/dL (ref 2.5–4.6)

## 2015-10-18 LAB — MAGNESIUM: MAGNESIUM: 2.5 mg/dL — AB (ref 1.7–2.4)

## 2015-10-18 NOTE — Progress Notes (Addendum)
PULMONARY / CRITICAL CARE MEDICINE   Name: Joanna Farmer MRN: [id number] DOB: [birth year]    ADMISSION DATE:  09/29/2015 CONSULTATION DATE:  09/29/2015  REFERRING MD:  Ezequiel Essex, M.D. / [facility]  CHIEF COMPLAINT:  Altered mental status, thalamic bleed.  BRIEF   60 year old female past medical history as below, which is significant for hypertension, alcohol abuse, cocaine abuse, HSV, coronary artery disease status post stents to RCA and LAD in 2015, and ischemic cardiomyopathy with LV EF 35-40%. She presented to [place] emergency department 6/15 with altered mental status. Reportedly the night before she suffered a fall during the night, and since that time has been sleeping all day. She was brought to the emergency department with this complaint. In emergency department she was unresponsive but reportedly breathing adequately. She had no spontaneous movement and left gaze deviation, but did have a positive gag reflex. CT scan of the head was obtained and demonstrated acute intracranial hemorrhage with intraventricular extension. Cervical spine was without radiographic evidence of injury. She was intubated for airway protection with anticipation of transfer to [facility]. PCCM accepting.  MICROBIOLOGY: MRSA PCR 6/15 >> (-) Blood Ctx x2 6/15 >> (-) Blood 6/20 > (-) Sputum 6/20 > (-) Urine 6/616- Citrobacter Urine 6/20 > (-)  ANTIBIOTICS: 6/15 Rocephin >>6/18 6/16 Acef (prophylactic abx) >> 6/18 Zosyn 6/20 > 7/2 (ONGOING FEVERS AND ? DRUG FEVER) Vanc 6/20 > 6/22  STUDIES:    LINES/TUBES: OETT 7.5 6/15 >> 6/24 replaced 6/24>> Foley 6/15 >> OGT 6/15 >> 6/24 PIV X3 IVC Drain 6/16 >>   SIGNIFICANT EVENTS: 6/15 - Admit to ICU on ventilator for ICH after transfer from [facility] ED 6/15 CT Head and neck: Acute intracranial hemorrhage with intraventricular extension and possible developing hydrocephalus Although no fractures identified in the cervical spine, the evaluation of the  cervical spine is limited. 6/19 CT head > no significant change since compared to prior scan. 6/20 CT Head   >>Degenerating RIGHT thalamus hematoma with local mass effect and edema. Similar mild hydrocephalus, stable RIGHT frontal ventriculostomy catheter. Degenerating intraventricular blood products.   6/25 Extubated successfully - re intubated   6/28: no sig change overnight.  CT stable.  Unable to wean cleviprex for HTN.   6/29: ongoing fevers withotu change in WBC. Moving right side. Doing SBt. Left side only flicker to pain occ per RN. Still on cleviprex - rn says new bp goal per NSGY is sbp < 140. Doing PSVT  10/15/15- per neurosurgery patient was brought per neurosurgery patient withdraws to stimulation intermittently and follows commands. X-ray ventricular drain appears to be functioning well. The plan is to possibly remove the drain on 10/17/2015. Ongoing fvers but biomarkers normal  10/17/15 - Patient following some commands this AM but clearly encephalopathic.   SUBJECTIVE/OVERNIGHT/INTERVAL HX 10/18/15 - unrespopnsive on vent . Per NSGY - raise drain to 15cm. Moves R  Side and family has agreed for trach. Fevers might be improving but still +     VITAL SIGNS: BP 123/57 mmHg  Pulse 90  Temp(Src) 100.3 F (37.9 C) (Oral)  Resp 21  Ht 5\' 3"  (1.6 m)  Wt 46.2 kg (101 lb 13.6 oz)  BMI 18.05 kg/m2  SpO2 100%  HEMODYNAMICS:    VENTILATOR SETTINGS: Vent Mode:  [-] PSV;CPAP FiO2 (%):  [30 %] 30 % Set Rate:  [16 bmp] 16 bmp Vt Set:  [420 mL] 420 mL PEEP:  [5 cmH20] 5 cmH20 Pressure Support:  [5 cmH20] 5 cmH20  Plateau Pressure:  [11 cmH20-15 cmH20] 13 cmH20  INTAKE / OUTPUT: I/O last 3 completed shifts: In: 1575 [NG/GT:1575] Out: 2077 [Urine:1755; Drains:297; Stool:25]  PHYSICAL EXAMINATION: General:  Ill appearing female, NAD with IVC drain.  Integument:  Warm & dry. No rash on exposed skin.  HEENT:  Drain in place. No scleral injection or icterus. no significant  oral secretions. Pupils 27mm, NR Cardiovascular:  RRR, no M/R/G and SEM at LUSB. Pulmonary:  Resps even non labored on PS 5/5, Coarse bilaterally.   Abdomen: Soft. Normal bowel sounds. Nondistended.  Musculoskeletal:  Normal bulk. No joint deformity or effusion appreciated. Neurological: NO sedation, . RASS-3 equivalent  LABS: PULMONARY  Recent Labs Lab 10/18/15 0231  PHART 7.441  PCO2ART 34.0*  PO2ART 123.0*  HCO3 23.1  TCO2 24  O2SAT 99.0    CBC  Recent Labs Lab 10/13/15 0622 10/14/15 0400 10/18/15 0344  HGB 9.2* 8.5* 8.7*  HCT 28.5* 26.8* 27.3*  WBC 7.9 6.5 6.7  PLT 352 381 307   COAGULATION No results for input(s): INR in the last 168 hours.  CARDIAC  No results for input(s): TROPONINI in the last 168 hours. No results for input(s): PROBNP in the last 168 hours.  CHEMISTRY  Recent Labs Lab 10/14/15 0400 10/15/15 0331 10/16/15 0356 10/17/15 0337 10/18/15 0344  NA 140 142 143 150* 144  K 3.6 3.1* 3.2* 3.7 3.7  CL 107 111 109 107 113*  CO2 24 24 24 23 23   GLUCOSE 123* 127* 126* 129* 128*  BUN 17 18 21* 21* 23*  CREATININE 0.68 0.79 0.98 0.68 0.65  CALCIUM 9.5 9.6 9.5 11.2* 9.9  MG 2.2 2.5* 2.5* 2.5* 2.5*  PHOS 3.4 4.3 4.5 3.7 4.4   Estimated Creatinine Clearance: 54.5 mL/min (by C-G formula based on Cr of 0.65).  LIVER  Recent Labs Lab 10/15/15 0331  AST 25  ALT 46  ALKPHOS 35*  BILITOT 0.2*  PROT 7.8  ALBUMIN 3.0*   INFECTIOUS  Recent Labs Lab 10/14/15 1255 10/15/15 0331 10/16/15 0356  LATICACIDVEN 0.7  --   --   PROCALCITON <0.10 <0.10 <0.10   ENDOCRINE CBG (last 3)   Recent Labs  10/17/15 2346 10/18/15 0352 10/18/15 0725  GLUCAP 126* 143* 120*   IMAGING x48h Dg Chest Port 1 View  10/18/2015  CLINICAL DATA:  Intubated EXAM: PORTABLE CHEST 1 VIEW COMPARISON:  10/17/2015 FINDINGS: Endotracheal tube has been retracted and is now 8.5 cm above the carina near the thoracic inlet. Heart is normal size. There is bibasilar  atelectasis. No effusions. No acute bony abnormality. IMPRESSION: Retraction of the endotracheal tube, now 8.5 cm above the carina. Bibasilar atelectasis. Electronically Signed   By: Rolm Baptise M.D.   On: 10/18/2015 07:59   Dg Chest Port 1 View  10/17/2015  CLINICAL DATA:  Intubated patient EXAM: PORTABLE CHEST 1 VIEW COMPARISON:  None in PACs FINDINGS: The lungs are adequately inflated. There is subtle increased alveolar density just above the right lateral costophrenic angle. The cardiac silhouette is normal in size. The pulmonary vascularity is not engorged. There is calcification in the wall of the aortic arch. The endotracheal tube tip lies 3.8 cm above the carina. The feeding tube tip lies in the region of the pylorus or proximal duodenum. There is coiling of much of the feeding tube in the stomach which may place the patient at risk for knotting of the tube. IMPRESSION: Right basilar atelectasis or subtle infiltrate. Otherwise no acute cardiopulmonary abnormality. The support tubes  are in reasonable position. See the discussion above regarding the coiling of the feeding tube within the stomach. There is aortic atherosclerosis. Electronically Signed   By: David  Martinique M.D.   On: 10/17/2015 07:32   DISCUSSION: 60 year old female past medical history of coronary artery disease, ischemic cardiomyopathy, and polysubstance abuse being admitted with right thalamic intracranial hemorrhage. She is intubated for airway protection and was transferred to [facility] for ICU admission. Will maintain on ventilator with tight blood pressure control with Cardene infusion. Neurology and neurosurgery consulted.  IVC drain placed early am 6/16.  Poor prognosis.   ASSESSMENT / PLAN:  PULMONARY A: Acute Hypoxemic resp failure 2/2 Unable to Protect Airway - Secondary to [place]. CXR 6/21 Possible LLL infiltrate, possible HCAP.    -10/18/15:  doing SBT but mental status precludes extubation. ET tube 8cm above carina  P:    ADVANCE ET TUBE 5.5cm -> CXR chest Family has agreed to trach PS wean as able, no extubation given mental status. Wean O2 for sats > 90%.   CARDIOVASCULAR A:  Chronic Systolic CHF - EF 123456 w/ grade 1 diastolic dysfunction. RV normal. Hypertension - s/p cleviprex ending 10/16/15 H/O CAD - S/P PCI in 2015 H/O Hyperlipidemia   - nil acute. 74/17   P:  Tight BP control with SBP goal < 16mmHg Amlodipine 5mg  daily 6/29 changed on 10/16/15 to 10mg  qhs, will continue. Continue carvedilol  RENAL A:   Metabolic Acidosis - resolved Hypernatremia.  P:   Monitor renal function and UOP. Strict I&O. Correct electrolytes as indicated. Free water 250 ml q6 hours.  GASTROINTESTINAL A:   H/O GERD On TF and ppi RN reports diarrhea x 1 week on 78/2/17  P:   TF  Chagne ppi to h2 blockade Check C diff - 7./2/17 - see ID section  HEMATOLOGIC A:   No acute issues. P:  Follow CBC SCDs No chemical DVT ppx given ICH  INFECTIOUS A:   H/O HSV Infection  7/4/17P - Persistent fever despite normal PCT, dc zosyn x48h and c diff negative. But WBC normal. Likely central fever  P:   Monitor off abx ? Needs CSF sample - NSGY input needed  ENDOCRINE A:   No acute issues P:   CBG's Q 4 hours SSI Follow glucose on BMP  NEUROLOGIC A:   Right Thalamic Intracranial Hemorrhage s/p IVC 6/16 Cocaine Abuse  H/O EtOH Abuse H/O Depression  Following commands on the right side only 10/17/15 . On 10/18/15 to MD - Alonna Buckler  P:  Sedation off  RASS goal: 0 but not achivevable prognostically Neurosurgery assistance appreciated, drain management as per their recs.  Blood pressure control as above. Poor prognosis   FAMILY  - Updates: Spoke with 2 daughters present and one son over the phone, they are discussing trach/peg.  GLOBAL - needs trach, family reportedly agreed. Need to advance et tube and check cxr   The patient is critically ill with multiple organ systems failure and  requires high complexity decision making for assessment and support, frequent evaluation and titration of therapies, application of advanced monitoring technologies and extensive interpretation of multiple databases.   Critical Care Time devoted to patient care services described in this note is  35  Minutes. This time reflects time of care of this signee Dr Wonda Horner This critical care time does not reflect procedure time, or teaching time or supervisory time of PA/NP/Med student/Med Resident etc but could involve care discussion time.  Dr. Brand Males, M.D.,  F.C.C.P Pulmonary and Critical Care Medicine Staff Physician [facility] Pager: [phone], If no answer or between  15:00h - 7:00h: call [phone]    10/18/2015 11:19 AM

## 2015-10-18 NOTE — Progress Notes (Signed)
Patient ID: Joanna Farmer, female   DOB: [birth year], 60 y.o.   MRN: [id number] BP 136/53 mmHg  Pulse 63  Temp(Src) 101.2 F (38.4 C) (Oral)  Resp 16  Ht 5\' 3"  (1.6 m)  Wt 46.2 kg (101 lb 13.6 oz)  BMI 18.05 kg/m2  SpO2 100% Will raise drain to 15cm Moves right side Family has decided to go with a tracheostomy

## 2015-10-18 NOTE — Progress Notes (Signed)
Upon arrival to patient room, patient ETT noted to be at 18 at the lip.  Tube position has been charted 22 at the lip.  Advanced ETT back 4cm to 22.  Bilateral breath sounds noted.  Sats maintained at 100%.  No complications.  Will continue to monitor.

## 2015-10-19 ENCOUNTER — Inpatient Hospital Stay (HOSPITAL_COMMUNITY): Payer: Medicaid Other

## 2015-10-19 LAB — GLUCOSE, CAPILLARY
GLUCOSE-CAPILLARY: 115 mg/dL — AB (ref 65–99)
GLUCOSE-CAPILLARY: 118 mg/dL — AB (ref 65–99)
GLUCOSE-CAPILLARY: 120 mg/dL — AB (ref 65–99)
GLUCOSE-CAPILLARY: 126 mg/dL — AB (ref 65–99)
GLUCOSE-CAPILLARY: 128 mg/dL — AB (ref 65–99)
Glucose-Capillary: 131 mg/dL — ABNORMAL HIGH (ref 65–99)

## 2015-10-19 MED ORDER — PNEUMOCOCCAL VAC POLYVALENT 25 MCG/0.5ML IJ INJ: 0.5000 mL | INJECTION | INTRAMUSCULAR | Status: DC | PRN

## 2015-10-19 NOTE — Progress Notes (Signed)
PULMONARY / CRITICAL CARE MEDICINE   Name: Joanna Farmer MRN: [id number] DOB: 08/14/1955    ADMISSION DATE:  09/29/2015 CONSULTATION DATE:  09/29/2015  REFERRING MD:  Ezequiel Essex, M.D. / [facility]  CHIEF COMPLAINT:  Altered mental status, thalamic bleed.  BRIEF   60 year old female past medical history as below, which is significant for hypertension, alcohol abuse, cocaine abuse, HSV, coronary artery disease status post stents to RCA and LAD in 2015, and ischemic cardiomyopathy with LV EF 35-40%. She presented to [facility] emergency department 6/15 with altered mental status. Reportedly the night before she suffered a fall during the night, and since that time has been sleeping all day. She was brought to the emergency department with this complaint. In emergency department she was unresponsive but reportedly breathing adequately. She had no spontaneous movement and left gaze deviation, but did have a positive gag reflex. CT scan of the head was obtained and demonstrated acute intracranial hemorrhage with intraventricular extension. Cervical spine was without radiographic evidence of injury. She was intubated for airway protection with anticipation of transfer to [facility]. PCCM accepting.  MICROBIOLOGY: MRSA PCR 6/15 >> (-) Blood Ctx x2 6/15 >> (-) Blood 6/20 > (-) Sputum 6/20 > (-) Urine 6/616- Citrobacter Urine 6/20 > (-)  ANTIBIOTICS: 6/15 Rocephin >>6/18 6/16 Acef (prophylactic abx) >> 6/18 Zosyn 6/20 > 7/2 (ONGOING FEVERS AND ? DRUG FEVER) Vanc 6/20 > 6/22  STUDIES:    LINES/TUBES: OETT 7.5 6/15 >> 6/24 replaced 6/24>> Foley 6/15 >> OGT 6/15 >> 6/24 PIV X3 IVC Drain 6/16 >>   SIGNIFICANT EVENTS: 6/15 - Admit to ICU on ventilator for ICH after transfer from [facility] ED 6/15 CT Head and neck: Acute intracranial hemorrhage with intraventricular extension and possible developing hydrocephalus Although no fractures identified in the cervical spine, the evaluation of the  cervical spine is limited. 6/19 CT head > no significant change since compared to prior scan. 6/20 CT Head   >>Degenerating RIGHT thalamus hematoma with local mass effect and edema. Similar mild hydrocephalus, stable RIGHT frontal ventriculostomy catheter. Degenerating intraventricular blood products.   6/25 Extubated successfully - re intubated   6/28: no sig change overnight.  CT stable.  Unable to wean cleviprex for HTN.   6/29: ongoing fevers withotu change in WBC. Moving right side. Doing SBt. Left side only flicker to pain occ per RN. Still on cleviprex - rn says new bp goal per NSGY is sbp < 140. Doing PSVT  10/15/15- per neurosurgery patient was brought per neurosurgery patient withdraws to stimulation intermittently and follows commands. X-ray ventricular drain appears to be functioning well. The plan is to possibly remove the drain on 10/17/2015. Ongoing fvers but biomarkers normal  10/17/15 - Patient following some commands this AM but clearly encephalopathic.   SUBJECTIVE/OVERNIGHT/INTERVAL HX 7/5 - unrespopnsive on vent .     VITAL SIGNS: BP 140/62 mmHg  Pulse 76  Temp(Src) 99.5 F (37.5 C) (Axillary)  Resp 16  Ht 5\' 3"  (1.6 m)  Wt 101 lb 6.6 oz (46 kg)  BMI 17.97 kg/m2  SpO2 100%  HEMODYNAMICS:    VENTILATOR SETTINGS: Vent Mode:  [-] CPAP;PSV FiO2 (%):  [30 %] 30 % Set Rate:  [16 bmp] 16 bmp Vt Set:  [420 mL] 420 mL PEEP:  [5 cmH20] 5 cmH20 Pressure Support:  [5 cmH20] 5 cmH20 Plateau Pressure:  [14 cmH20-15 cmH20] 15 cmH20  INTAKE / OUTPUT: I/O last 3 completed shifts: In: 2180 [NG/GT:2180] Out: 2264 [Urine:2040; Drains:184;  Stool:40]  PHYSICAL EXAMINATION: General:  Ill appearing female, NAD with IVC drain. At 15 cm Integument:  Warm & dry. No rash on exposed skin.  HEENT:  Drain in place. No scleral injection or icterus. no significant oral secretions. Pupils 52mm, NR Cardiovascular:  RRR, no M/R/G and SEM at LUSB. Pulmonary:  Resps even non  labored on PS 5/5, Coarse bilaterally.   Abdomen: Soft. Normal bowel sounds. Nondistended. TF at goal Musculoskeletal:  Normal bulk. No joint deformity or effusion appreciated. Neurological: NO sedation, . RASS-3 equivalent  LABS: PULMONARY  Recent Labs Lab 10/18/15 0231  PHART 7.441  PCO2ART 34.0*  PO2ART 123.0*  HCO3 23.1  TCO2 24  O2SAT 99.0    CBC  Recent Labs Lab 10/13/15 0622 10/14/15 0400 10/18/15 0344  HGB 9.2* 8.5* 8.7*  HCT 28.5* 26.8* 27.3*  WBC 7.9 6.5 6.7  PLT 352 381 307   COAGULATION No results for input(s): INR in the last 168 hours.  CARDIAC  No results for input(s): TROPONINI in the last 168 hours. No results for input(s): PROBNP in the last 168 hours.  CHEMISTRY  Recent Labs Lab 10/14/15 0400 10/15/15 0331 10/16/15 0356 10/17/15 0337 10/18/15 0344  NA 140 142 143 150* 144  K 3.6 3.1* 3.2* 3.7 3.7  CL 107 111 109 107 113*  CO2 24 24 24 23 23   GLUCOSE 123* 127* 126* 129* 128*  BUN 17 18 21* 21* 23*  CREATININE 0.68 0.79 0.98 0.68 0.65  CALCIUM 9.5 9.6 9.5 11.2* 9.9  MG 2.2 2.5* 2.5* 2.5* 2.5*  PHOS 3.4 4.3 4.5 3.7 4.4   Estimated Creatinine Clearance: 54.3 mL/min (by C-G formula based on Cr of 0.65).  LIVER  Recent Labs Lab 10/15/15 0331  AST 25  ALT 46  ALKPHOS 35*  BILITOT 0.2*  PROT 7.8  ALBUMIN 3.0*   INFECTIOUS  Recent Labs Lab 10/14/15 1255 10/15/15 0331 10/16/15 0356  LATICACIDVEN 0.7  --   --   PROCALCITON <0.10 <0.10 <0.10   ENDOCRINE CBG (last 3)   Recent Labs  10/18/15 2338 10/19/15 0358 10/19/15 0735  GLUCAP 127* 131* 128*   IMAGING x48h Dg Chest Port 1 View  10/18/2015  CLINICAL DATA:  60 year old female with a history of adjusted endotracheal tube EXAM: PORTABLE CHEST 1 VIEW COMPARISON:  10/18/2015 FINDINGS: Cardiomediastinal silhouette unchanged. Calcifications of the aortic arch. Vascular stent projects adjacent to the left heart border. Enteric feeding tube projects over the mediastinum and  terminates at the midline overlying the spine. Endotracheal tube has been advanced in the interval, suitably position above the carina, approximately 4.4 cm. Mixed interstitial and airspace opacities at the bilateral lung bases with decreased lung volumes compared to the prior. No large pleural effusion or pneumothorax. IMPRESSION: Interval advanced endotracheal tube, which now terminates suitably above the carina approximately 4.4 cm. Decreased lung volumes with bibasilar opacities, potentially atelectasis. Unchanged position of the feeding tube, which terminates in the abdomen overlying the midline. Aortic atherosclerosis.  Evidence of prior PTCI. Signed, Dulcy Fanny. Earleen Newport, DO [facility] Radiology Electronically Signed   By: Corrie Mckusick D.O.   On: 10/18/2015 11:57   Dg Chest Port 1 View  10/18/2015  CLINICAL DATA:  Intubated EXAM: PORTABLE CHEST 1 VIEW COMPARISON:  10/17/2015 FINDINGS: Endotracheal tube has been retracted and is now 8.5 cm above the carina near the thoracic inlet. Heart is normal size. There is bibasilar atelectasis. No effusions. No acute bony abnormality. IMPRESSION: Retraction of the endotracheal  tube, now 8.5 cm above the carina. Bibasilar atelectasis. Electronically Signed   By: Rolm Baptise M.D.   On: 10/18/2015 07:59   DISCUSSION: 60 year old female past medical history of coronary artery disease, ischemic cardiomyopathy, and polysubstance abuse being admitted with right thalamic intracranial hemorrhage. She is intubated for airway protection and was transferred to [facility] for ICU admission. Will maintain on ventilator with tight blood pressure control with Cardene infusion. Neurology and neurosurgery consulted.  IVC drain placed early am 6/16.  Poor prognosis.   ASSESSMENT / PLAN:  PULMONARY A: Acute Hypoxemic resp failure 2/2 Unable to Protect Airway - Secondary to [place]. CXR 6/21 Possible LLL infiltrate, possible HCAP.     -10/19/15:  doing SBT but mental status precludes extubation P:   Family has agreed to trach PS wean as able, no extubation given mental status. Wean O2 for sats > 90%.  CARDIOVASCULAR A:  Chronic Systolic CHF - EF 123456 w/ grade 1 diastolic dysfunction. RV normal. Hypertension - s/p cleviprex ending 10/16/15 H/O CAD - S/P PCI in 2015 H/O Hyperlipidemia  P:  Tight BP control with SBP goal < 179mmHg Amlodipine 5mg  daily 6/29 changed on 10/16/15 to 10mg  qhs, will continue. Continue carvedilol  RENAL A:   Metabolic Acidosis - resolved Hypernatremia. Resolved 7/5  P:   Monitor renal function and UOP. Strict I&O. Correct electrolytes as indicated. Free water 250 ml q6 hours.  GASTROINTESTINAL A:   H/O GERD On TF and ppi RN reports diarrhea x 1 week on 78/2/17  P:   TF  Chagne ppi to h2 blockade Check C diff - 7./2/17 - see ID section  HEMATOLOGIC A:   No acute issues. P:  Follow CBC SCDs No chemical DVT ppx given ICH  INFECTIOUS A:   H/O HSV Infection  7/4/17P - Persistent fever despite normal PCT, dc zosyn x48h and c diff negative. But WBC normal. Likely central fever  P:   Monitor off abx ? Needs CSF sample - NSGY input needed  ENDOCRINE CBG (last 3)   Recent Labs  10/18/15 2338 10/19/15 0358 10/19/15 0735  GLUCAP 127* 131* 128*   A:   No acute issues P:   CBG's Q 4 hours SSI Follow glucose on BMP  NEUROLOGIC A:   Right Thalamic Intracranial Hemorrhage s/p IVC 6/16 Cocaine Abuse  H/O EtOH Abuse H/O Depression  P:  Sedation off  RASS goal: 0 but not achivevable prognostically Neurosurgery assistance appreciated, drain management as per their recs.  Blood pressure control as above. Poor prognosis   FAMILY  - Updates: Spoke with 2 daughters present and one son over the phone, they are discussing trach/peg.  GLOBAL - needs trach, family reportedly agreed. Poor functional prognosis.  [name] Minor ACNP Maryanna Shape PCCM Pager [phone] till 3  pm If no answer page [phone] 10/19/2015, 8:47 AM  Attending Note:  60 year old female with cocaine induced hypertensive thalamic bleed who is not responsive this AM but did follow some commands on Monday.  Lungs clear to ausculation and patient is weaning but mental status precludes extubation.  If family continues to wish for full support then will need tracheostomy.  Fiance is getting in touch with family right now to determine plan of care.  In the meantime, continue BP control and weaning as able.  Will get in touch with family later this afternoon.  The patient is critically ill with multiple organ systems failure and requires high complexity decision making for assessment and support, frequent  evaluation and titration of therapies, application of advanced monitoring technologies and extensive interpretation of multiple databases.   Critical Care Time devoted to patient care services described in this note is  35  Minutes. This time reflects time of care of this signee Dr Jennet Maduro. This critical care time does not reflect procedure time, or teaching time or supervisory time of PA/NP/Med student/Med Resident etc but could involve care discussion time.  [name], M.D. [facility] Pulmonary/Critical Care Medicine. Pager: [phone]. After hours pager: [phone].

## 2015-10-19 NOTE — Progress Notes (Signed)
No issues overnight.   EXAM:  BP 167/71 mmHg  Pulse 75  Temp(Src) 99.5 F (37.5 C) (Axillary)  Resp 17  Ht 5\' 3"  (1.6 m)  Wt 46 kg (101 lb 6.6 oz)  BMI 17.97 kg/m2  SpO2 100%  Not opening eyes Breathes over vent Localizes RUE/RLE W/d weakly on left EVD in place, functioning.  IMPRESSION:  60 y.o. female s/p IVH VDRF  PLAN: - Trach later this week - Will get CT today, then clamp drain. Plan to transduce ICP as we cannot reliably follow neurologic exam, and will plan on repeat CTH in 48hrs, if stable without ventriculomegaly and unchanged pressure, may be able to d/c drain

## 2015-10-19 NOTE — Progress Notes (Signed)
Patient's ICP rising and staying in mid 20s and decreased responsiveness of patient.  Tried multiple interventions to decrease ICP with no success.  Called Dr Kathyrn Sheriff and given orders to open drain and level to 10cm H2O. Will continue to monitor. Joanna Farmer C 1:34 PM

## 2015-10-19 NOTE — Progress Notes (Signed)
Spoke with pt's daughter, Ethlyn Daniels, by phone, at her request.  She is concerned, as pt currently renting an apartment where she and her fiance live.  The apt is in patient's name, and daughter concerned that the fiance continues to live there, knowing that pt is very unlikely to return there.  The landlord has told her that unless they have POA, they cannot change the lease agreement.  I explained that pt is not able to give consent for POA, and son/daughter may need to consider consulting a lawyer to assist in this matter.    Daughter appreciated my help; denies any additional needs at this time.    Reinaldo Raddle, RN, BSN  Trauma/Neuro ICU Case Manager [phone]

## 2015-10-19 NOTE — Progress Notes (Signed)
Patient remains weaning on CPAP/PSV 5/5 with no complications. Will continue to monitor pt.

## 2015-10-20 ENCOUNTER — Inpatient Hospital Stay (HOSPITAL_COMMUNITY): Payer: Medicaid Other

## 2015-10-20 LAB — PROTIME-INR
INR: 1.07 (ref 0.00–1.49)
Prothrombin Time: 14.1 seconds (ref 11.6–15.2)

## 2015-10-20 LAB — CBC
HEMATOCRIT: 25.8 % — AB (ref 36.0–46.0)
HEMOGLOBIN: 8.3 g/dL — AB (ref 12.0–15.0)
MCH: 32 pg (ref 26.0–34.0)
MCHC: 32.2 g/dL (ref 30.0–36.0)
MCV: 99.6 fL (ref 78.0–100.0)
Platelets: 265 10*3/uL (ref 150–400)
RBC: 2.59 MIL/uL — ABNORMAL LOW (ref 3.87–5.11)
RDW: 13.6 % (ref 11.5–15.5)
WBC: 7 10*3/uL (ref 4.0–10.5)

## 2015-10-20 LAB — BASIC METABOLIC PANEL
ANION GAP: 9 (ref 5–15)
BUN: 16 mg/dL (ref 6–20)
CHLORIDE: 107 mmol/L (ref 101–111)
CO2: 24 mmol/L (ref 22–32)
Calcium: 9.6 mg/dL (ref 8.9–10.3)
Creatinine, Ser: 0.5 mg/dL (ref 0.44–1.00)
GFR calc non Af Amer: >60 mL/min (ref >60)
Glucose, Bld: 121 mg/dL — ABNORMAL HIGH (ref 65–99)
Potassium: 3.8 mmol/L (ref 3.5–5.1)
Sodium: 140 mmol/L (ref 135–145)

## 2015-10-20 LAB — GLUCOSE, CAPILLARY
GLUCOSE-CAPILLARY: 112 mg/dL — AB (ref 65–99)
GLUCOSE-CAPILLARY: 142 mg/dL — AB (ref 65–99)
GLUCOSE-CAPILLARY: 113 mg/dL — AB (ref 65–99)
GLUCOSE-CAPILLARY: 106 mg/dL — AB (ref 65–99)
Glucose-Capillary: 113 mg/dL — ABNORMAL HIGH (ref 65–99)
Glucose-Capillary: 122 mg/dL — ABNORMAL HIGH (ref 65–99)

## 2015-10-20 LAB — APTT: APTT: 32 s (ref 24–37)

## 2015-10-20 LAB — PHOSPHORUS: Phosphorus: 3.8 mg/dL (ref 2.5–4.6)

## 2015-10-20 LAB — MAGNESIUM: MAGNESIUM: 2.2 mg/dL (ref 1.7–2.4)

## 2015-10-20 MED ORDER — FENTANYL CITRATE (PF) 100 MCG/2ML IJ SOLN
200.0000 ug | Freq: Once | INTRAMUSCULAR | Status: AC
  Administered 2015-10-21: 100 ug via INTRAVENOUS
  Filled 2015-10-20: qty 4

## 2015-10-20 MED ORDER — ETOMIDATE 2 MG/ML IV SOLN
40.0000 mg | Freq: Once | INTRAVENOUS | Status: AC
  Administered 2015-10-21: 20 mg via INTRAVENOUS

## 2015-10-20 MED ORDER — MIDAZOLAM HCL 2 MG/2ML IJ SOLN
4.0000 mg | Freq: Once | INTRAMUSCULAR | Status: AC
  Administered 2015-10-21 (×2): 2 mg via INTRAVENOUS
  Filled 2015-10-20: qty 4

## 2015-10-20 MED ORDER — PROPOFOL 500 MG/50ML IV EMUL
5.0000 ug/kg/min | Freq: Once | INTRAVENOUS | Status: AC
  Administered 2015-10-21: 40 ug/kg/min via INTRAVENOUS

## 2015-10-20 MED ORDER — VECURONIUM BROMIDE 10 MG IV SOLR
10.0000 mg | Freq: Once | INTRAVENOUS | Status: AC
  Administered 2015-10-21: 10 mg via INTRAVENOUS
  Filled 2015-10-20: qty 10

## 2015-10-20 NOTE — Progress Notes (Signed)
Pt seen and examined. EVD clamped yesterday however ICP increased requiring re-opening. Remains intubated.  EXAM: Temp:  [98.5 F (36.9 C)-100.7 F (38.2 C)] 99.6 F (37.6 C) (07/06 0000) Pulse Rate:  [60-78] 66 (07/06 0000) Resp:  [9-29] 10 (07/06 0000) BP: (118-179)/(54-77) 127/58 mmHg (07/06 0000) SpO2:  [98 %-100 %] 98 % (07/06 0000) FiO2 (%):  [30 %] 30 % (07/05 2305) Weight:  [46 kg (101 lb 6.6 oz)] 46 kg (101 lb 6.6 oz) (07/05 0500) Intake/Output      07/05 0701 - 07/06 0700   NG/GT 1665   Total Intake(mL/kg) 1665 (36.2)   Urine (mL/kg/hr) 785 (0.7)   Drains 114 (0.1)   Total Output 899   Net +766        No eye opening to pain Pupils anisocoric, right slightly larger than left. W/D RUE/RLE, minimal movements LUE to pain EVD in place, draining well  LABS: Lab Results  Component Value Date   CREATININE 0.65 10/18/2015   BUN 23* 10/18/2015   NA 144 10/18/2015   K 3.7 10/18/2015   CL 113* 10/18/2015   CO2 23 10/18/2015   Lab Results  Component Value Date   WBC 6.7 10/18/2015   HGB 8.7* 10/18/2015   HCT 27.3* 10/18/2015   MCV 99.6 10/18/2015   PLT 307 10/18/2015    IMPRESSION: - 60 y.o. female with thalamic ICH. Two attempts have been made to wean EVD unsuccessfully. Pt is likely shunt dependent. - VDRF  PLAN: - Trach later this week - Will likely need shunt when Dr. Cyndy Freeze returns. Keep drain open at 10 for now.

## 2015-10-20 NOTE — Progress Notes (Signed)
PULMONARY / CRITICAL CARE MEDICINE   Name: DAISIE ZARING MRN: [id number] DOB: [birth year]    ADMISSION DATE:  09/29/2015 CONSULTATION DATE:  09/29/2015  REFERRING MD:  Ezequiel Essex, M.D. / [facility]  CHIEF COMPLAINT:  Altered mental status, thalamic bleed.  BRIEF   60 year old female past medical history as below, which is significant for hypertension, alcohol abuse, cocaine abuse, HSV, coronary artery disease status post stents to RCA and LAD in 2015, and ischemic cardiomyopathy with LV EF 35-40%. She presented to [facility] emergency department 6/15 with altered mental status. Reportedly the night before she suffered a fall during the night, and since that time has been sleeping all day. She was brought to the emergency department with this complaint. In emergency department she was unresponsive but reportedly breathing adequately. She had no spontaneous movement and left gaze deviation, but did have a positive gag reflex. CT scan of the head was obtained and demonstrated acute intracranial hemorrhage with intraventricular extension. Cervical spine was without radiographic evidence of injury. She was intubated for airway protection with anticipation of transfer to [facility]. PCCM accepting.  MICROBIOLOGY: MRSA PCR 6/15 >> (-) Blood Ctx x2 6/15 >> (-) Blood 6/20 > (-) Sputum 6/20 > (-) Urine 6/616- Citrobacter Urine 6/20 > (-)  ANTIBIOTICS: 6/15 Rocephin >>6/18 6/16 Acef (prophylactic abx) >> 6/18 Zosyn 6/20 > 7/2 (ONGOING FEVERS AND ? DRUG FEVER) Vanc 6/20 > 6/22  STUDIES:    LINES/TUBES: OETT 7.5 6/15 >> 6/24 replaced 6/24>> Foley 6/15 >> OGT 6/15 >> 6/24 PIV X3 IVC Drain 6/16 >>   SIGNIFICANT EVENTS: 6/15 - Admit to ICU on ventilator for ICH after transfer from [facility] ED 6/15 CT Head and neck: Acute intracranial hemorrhage with intraventricular extension and possible developing hydrocephalus Although no fractures identified in the cervical spine, the evaluation of the  cervical spine is limited. 6/19 CT head > no significant change since compared to prior scan. 6/20 CT Head   >>Degenerating RIGHT thalamus hematoma with local mass effect and edema. Similar mild hydrocephalus, stable RIGHT frontal ventriculostomy catheter. Degenerating intraventricular blood products.   6/25 Extubated successfully - re intubated   6/28: no sig change overnight.  CT stable.  Unable to wean cleviprex for HTN.   6/29: ongoing fevers withotu change in WBC. Moving right side. Doing SBt. Left side only flicker to pain occ per RN. Still on cleviprex - rn says new bp goal per NSGY is sbp < 140. Doing PSVT  10/15/15- per neurosurgery patient was brought per neurosurgery patient withdraws to stimulation intermittently and follows commands. X-ray ventricular drain appears to be functioning well. The plan is to possibly remove the drain on 10/17/2015. Ongoing fvers but biomarkers normal  10/17/15 - Patient following some commands this AM but clearly encephalopathic.  SUBJECTIVE/OVERNIGHT/INTERVAL HX No events overnight, unresponsive this AM.  VITAL SIGNS: BP 136/58 mmHg  Pulse 66  Temp(Src) 100.8 F (38.2 C) (Axillary)  Resp 16  Ht 5\' 3"  (1.6 m)  Wt 47.7 kg (105 lb 2.6 oz)  BMI 18.63 kg/m2  SpO2 100%  HEMODYNAMICS:    VENTILATOR SETTINGS: Vent Mode:  [-] PRVC FiO2 (%):  [30 %] 30 % Set Rate:  [16 bmp] 16 bmp Vt Set:  [420 mL] 420 mL PEEP:  [5 cmH20] 5 cmH20 Pressure Support:  [5 cmH20] 5 cmH20 Plateau Pressure:  [12 cmH20-15 cmH20] 12 cmH20  INTAKE / OUTPUT: I/O last 3 completed shifts: In: 2770 [NG/GT:2770] Out: 1971 [id number]; Drains:216]  PHYSICAL EXAMINATION: General:  Ill appearing female, NAD with IVC drain. At 15 cm Integument:  Warm & dry. No rash on exposed skin.  HEENT:  Drain in place. No scleral injection or icterus. no significant oral secretions. Pupils 61mm, NR Cardiovascular:  RRR, no M/R/G and SEM at LUSB. Pulmonary:  Resps even non labored on  PS 5/5, Coarse bilaterally.   Abdomen: Soft. Normal bowel sounds. Nondistended. TF at goal Musculoskeletal:  Normal bulk. No joint deformity or effusion appreciated. Neurological: NO sedation, . RASS-3 equivalent, unresponsive and not withdrawing to pain this AM.  LABS: PULMONARY  Recent Labs Lab 10/18/15 0231  PHART 7.441  PCO2ART 34.0*  PO2ART 123.0*  HCO3 23.1  TCO2 24  O2SAT 99.0   CBC  Recent Labs Lab 10/14/15 0400 10/18/15 0344 10/20/15 0440  HGB 8.5* 8.7* 8.3*  HCT 26.8* 27.3* 25.8*  WBC 6.5 6.7 7.0  PLT 381 307 265   COAGULATION No results for input(s): INR in the last 168 hours.  CARDIAC  No results for input(s): TROPONINI in the last 168 hours. No results for input(s): PROBNP in the last 168 hours.  CHEMISTRY  Recent Labs Lab 10/15/15 0331 10/16/15 0356 10/17/15 0337 10/18/15 0344 10/20/15 0440  NA 142 143 150* 144 140  K 3.1* 3.2* 3.7 3.7 3.8  CL 111 109 107 113* 107  CO2 24 24 23 23 24   GLUCOSE 127* 126* 129* 128* 121*  BUN 18 21* 21* 23* 16  CREATININE 0.79 0.98 0.68 0.65 0.50  CALCIUM 9.6 9.5 11.2* 9.9 9.6  MG 2.5* 2.5* 2.5* 2.5* 2.2  PHOS 4.3 4.5 3.7 4.4 3.8   Estimated Creatinine Clearance: 56.3 mL/min (by C-G formula based on Cr of 0.5).  LIVER  Recent Labs Lab 10/15/15 0331  AST 25  ALT 46  ALKPHOS 35*  BILITOT 0.2*  PROT 7.8  ALBUMIN 3.0*   INFECTIOUS  Recent Labs Lab 10/14/15 1255 10/15/15 0331 10/16/15 0356  LATICACIDVEN 0.7  --   --   PROCALCITON <0.10 <0.10 <0.10   ENDOCRINE CBG (last 3)   Recent Labs  10/19/15 1955 10/19/15 2356 10/20/15 0355  GLUCAP 126* 115* 112*   IMAGING x48h Ct Head Wo Contrast  10/19/2015  CLINICAL DATA:  60 year old female with intracranial hemorrhage status post external ventricular drain placement. Ventriculomegaly. Initial encounter. EXAM: CT HEAD WITHOUT CONTRAST TECHNIQUE: Contiguous axial images were obtained from the base of the skull through the vertex without  intravenous contrast. COMPARISON:  10/13/2015 and earlier. FINDINGS: Stable visualized osseous structures. Paranasal sinuses and mastoids are stable and well pneumatized. No acute orbit or scalp soft tissue finding. Right frontal approach external ventricular drain re- demonstrated. The intracranial catheter tip abuts the septum pellucidum and appears to communicate with the right lateral ventricle as seen on series 207, image 28. Significant generalized ventricular enlargement since 10/13/2015, relatively sparing the fourth ventricle. Expected evolution of the 3-4 cm hyperdense central brain hemorrhage centered at the right cerebral peduncle. Hyperdense hemorrhage there has decreased to 35 x 28 mm versus 39 x 33 mm previously. Previously-seen small volume lateral intraventricular hemorrhage appears resolved. No intraventricular or extra-axial hemorrhage identified. No new intracranial hemorrhage. Hypodense edema surrounding the resolving parenchymal hematoma has not significantly changed. Stable gray-white matter differentiation elsewhere. No cortically based acute infarct identified. IMPRESSION: 1. Interval ventriculomegaly relatively sparing the fourth ventricle, while the right frontal approach EVD positioning appears stable. Query EVD malfunction. 2. Expected evolution of the hyperdense hemorrhage centered at the right cerebral peduncle. Small volume of intraventricular hemorrhage has  resolved. Electronically Signed   By: Genevie Ann M.D.   On: 10/19/2015 11:52   Dg Chest Port 1 View  10/20/2015  CLINICAL DATA:  Ischemic cardiomyopathy, suspect acute MI, known cardiac steps, respiratory failure and pneumonia, acute intracranial hemorrhage EXAM: PORTABLE CHEST 1 VIEW COMPARISON:  Portable chest x-ray of October 18, 2015 FINDINGS: The lungs are well-expanded. Infiltrates at both lung bases are less conspicuous. There is no significant pleural effusion and no pneumothorax. The heart is top-normal in size. The pulmonary  vascularity is normal. There is calcification in the wall of the aortic arch. The endotracheal tube tip lies 4.7 cm above the carina. The feeding tube tip projects below the inferior margin of the image with multiple overlapping coils in the stomach. IMPRESSION: Slight interval improvement in the appearance of the lungs with decreased atelectasis or infiltrate at the bases. No CHF. Aortic atherosclerosis. Electronically Signed   By: David  Martinique M.D.   On: 10/20/2015 07:08   Dg Chest Port 1 View  10/18/2015  CLINICAL DATA:  60 year old female with a history of adjusted endotracheal tube EXAM: PORTABLE CHEST 1 VIEW COMPARISON:  10/18/2015 FINDINGS: Cardiomediastinal silhouette unchanged. Calcifications of the aortic arch. Vascular stent projects adjacent to the left heart border. Enteric feeding tube projects over the mediastinum and terminates at the midline overlying the spine. Endotracheal tube has been advanced in the interval, suitably position above the carina, approximately 4.4 cm. Mixed interstitial and airspace opacities at the bilateral lung bases with decreased lung volumes compared to the prior. No large pleural effusion or pneumothorax. IMPRESSION: Interval advanced endotracheal tube, which now terminates suitably above the carina approximately 4.4 cm. Decreased lung volumes with bibasilar opacities, potentially atelectasis. Unchanged position of the feeding tube, which terminates in the abdomen overlying the midline. Aortic atherosclerosis.  Evidence of prior PTCI. Signed, Dulcy Fanny. Earleen Newport, DO [facility] Electronically Signed   By: Corrie Mckusick D.O.   On: 10/18/2015 11:57   DISCUSSION: 60 year old female past medical history of coronary artery disease, ischemic cardiomyopathy, and polysubstance abuse being admitted with right thalamic intracranial hemorrhage. She is intubated for airway protection and was transferred to [facility] for ICU  admission. Will maintain on ventilator with tight blood pressure control with Cardene infusion. Neurology and neurosurgery consulted.  IVC drain placed early am 6/16.  Poor prognosis.   ASSESSMENT / PLAN:  PULMONARY A: Acute Hypoxemic resp failure 2/2 Unable to Protect Airway - Secondary to [place]. CXR 6/21 Possible LLL infiltrate, possible HCAP.   P:   Plan trach in AM at 10 AM. PS wean as able, no extubation given mental status. Wean O2 for sats > 90%.  CARDIOVASCULAR A:  Chronic Systolic CHF - EF 123456 w/ grade 1 diastolic dysfunction. RV normal. Hypertension - s/p cleviprex ending 10/16/15 H/O CAD - S/P PCI in 2015 H/O Hyperlipidemia  P:  Tight BP control with SBP goal < 176mmHg Amlodipine 5mg  daily 6/29 changed on 10/16/15 to 10mg  qhs, will continue. Continue carvedilol  RENAL A:   Metabolic Acidosis - resolved Hypernatremia. Resolved 7/5  P:   Monitor renal function and UOP. Strict I&O. Correct electrolytes as indicated. Free water 250 ml q6 hours.  GASTROINTESTINAL A:   H/O GERD On TF and ppi RN reports diarrhea x 1 week on 78/2/17  P:   TF  Changed ppi to h2 blockade  HEMATOLOGIC A:   No acute issues. P:  Follow CBC SCDs No chemical DVT ppx given ICH  INFECTIOUS A:   H/O HSV Infection  7/4/17P - Persistent fever despite normal PCT, dc zosyn x48h and c diff negative. But WBC normal. Likely central fever  P:   Monitor off abx C.diff negative. NS to send sample of CSF for HSV.  ENDOCRINE CBG (last 3)   Recent Labs  10/19/15 1955 10/19/15 2356 10/20/15 0355  GLUCAP 126* 115* 112*   A:   No acute issues P:   CBG's Q 4 hours SSI Follow glucose on BMP  NEUROLOGIC A:   Right Thalamic Intracranial Hemorrhage s/p IVC 6/16 Cocaine Abuse  H/O EtOH Abuse H/O Depression  P:  Sedation off  RASS goal: 0 but not achivevable prognostically Neurosurgery assistance appreciated, drain management as per their recs.  Blood pressure control as  above. Poor prognosis   FAMILY  - Updates: Spoke with daughter Lenna Sciara over the phone, confirmed trach placement on 7/7 at 10 AM, will place pre-trach order set.  GLOBAL - needs trach, family reportedly agreed. Poor functional prognosis.  The patient is critically ill with multiple organ systems failure and requires high complexity decision making for assessment and support, frequent evaluation and titration of therapies, application of advanced monitoring technologies and extensive interpretation of multiple databases.   Critical Care Time devoted to patient care services described in this note is  35  Minutes. This time reflects time of care of this signee Dr Jennet Maduro. This critical care time does not reflect procedure time, or teaching time or supervisory time of PA/NP/Med student/Med Resident etc but could involve care discussion time.  [name], M.D. [facility] Pulmonary/Critical Care Medicine. Pager: [phone]. After hours pager: [phone].

## 2015-10-20 NOTE — Progress Notes (Signed)
Nutrition Follow-up  INTERVENTION:   Continue Vital AF 1.2 at goal rate of 45 ml/hr via NGT to provide 1296 kcal (95% of needs), 81 grams of protein, and 875 ml of free water.  Free water: 250 ml every 6 hours Total free water: 1875 ml  NUTRITION DIAGNOSIS:   Inadequate oral intake related to inability to eat as evidenced by NPO status. Ongoing.   GOAL:   Patient will meet greater than or equal to 90% of their needs Met.   MONITOR:   TF tolerance, Vent status, Labs  ASSESSMENT:   Pt with past medical history of coronary artery disease, ischemic cardiomyopathy, and polysubstance abuse being admitted with right thalamic intracranial hemorrhage, right frontal external ventricular drain placed 6/16.   Patient is currently intubated on ventilator support MV: 7.4 L/min Temp (24hrs), Avg:100.3 F (37.9 C), Min:99.6 F (37.6 C), Max:100.8 F (38.2 C)  Medications reviewed and include: MVI Labs reviewed: CBG's: 112-115 Weight stable  Plan for trach 7/7  Diet Order:  Diet NPO time specified Diet NPO time specified  Skin:  Reviewed, no issues  Last BM:  7/5 40 ml via rectal pouch, 0 documented 7/4  Height:   Ht Readings from Last 1 Encounters:  09/29/15 _0  (1.6 m)    Weight:   Wt Readings from Last 1 Encounters:  10/20/15 105 lb 2.6 oz (47.7 kg)    Ideal Body Weight:  52.2 kg  BMI:  Body mass index is 18.63 kg/(m^2).  Estimated Nutritional Needs:   Kcal:  1376  Protein:  70-85 grams  Fluid:  > 1.5 L/day  EDUCATION NEEDS:   No education needs identified at this time  [place], [place], [place] Pager [phone] After Hours Pager

## 2015-10-20 NOTE — Progress Notes (Signed)
Patient ID: Joanna Farmer, female   DOB: 1955/07/14, 60 y.o.   MRN: [id number] BP 138/57 mmHg  Pulse 76  Temp(Src) 99.1 F (37.3 C) (Axillary)  Resp 22  Ht 5\' 3"  (1.6 m)  Wt 47.7 kg (105 lb 2.6 oz)  BMI 18.63 kg/m2  SpO2 100% Will open left eye to noxious stimuli, does not follow commands Slight extension in left upper extremity, slight flexion in right upper extremity Conjugate gaze, +oculocephalics +corneals ventric draining.  No neurologic changes.

## 2015-10-21 ENCOUNTER — Inpatient Hospital Stay (HOSPITAL_COMMUNITY): Payer: Medicaid Other

## 2015-10-21 LAB — POCT I-STAT 3, ART BLOOD GAS (G3+)
Acid-Base Excess: 2 mmol/L (ref 0.0–2.0)
Bicarbonate: 24.9 mEq/L — ABNORMAL HIGH (ref 20.0–24.0)
O2 SAT: 99 %
PCO2 ART: 35.3 mmHg (ref 35.0–45.0)
PH ART: 7.46 — AB (ref 7.350–7.450)
TCO2: 26 mmol/L (ref 0–100)
pO2, Arterial: 147 mmHg — ABNORMAL HIGH (ref 80.0–100.0)

## 2015-10-21 LAB — BASIC METABOLIC PANEL
Anion gap: 8 (ref 5–15)
BUN: 17 mg/dL (ref 6–20)
CHLORIDE: 105 mmol/L (ref 101–111)
CO2: 23 mmol/L (ref 22–32)
CREATININE: 0.53 mg/dL (ref 0.44–1.00)
Calcium: 9.1 mg/dL (ref 8.9–10.3)
GFR calc Af Amer: >60 mL/min (ref >60)
GFR calc non Af Amer: >60 mL/min (ref >60)
Glucose, Bld: 113 mg/dL — ABNORMAL HIGH (ref 65–99)
Potassium: 3.5 mmol/L (ref 3.5–5.1)
SODIUM: 136 mmol/L (ref 135–145)

## 2015-10-21 LAB — PHOSPHORUS: Phosphorus: 3.9 mg/dL (ref 2.5–4.6)

## 2015-10-21 LAB — CBC
HCT: 24 % — ABNORMAL LOW (ref 36.0–46.0)
HEMOGLOBIN: 7.9 g/dL — AB (ref 12.0–15.0)
MCH: 32.2 pg (ref 26.0–34.0)
MCHC: 32.9 g/dL (ref 30.0–36.0)
MCV: 98 fL (ref 78.0–100.0)
Platelets: 258 10*3/uL (ref 150–400)
RBC: 2.45 MIL/uL — ABNORMAL LOW (ref 3.87–5.11)
RDW: 13.3 % (ref 11.5–15.5)
WBC: 6.1 10*3/uL (ref 4.0–10.5)

## 2015-10-21 LAB — GLUCOSE, CAPILLARY
GLUCOSE-CAPILLARY: 110 mg/dL — AB (ref 65–99)
GLUCOSE-CAPILLARY: 119 mg/dL — AB (ref 65–99)
Glucose-Capillary: 96 mg/dL (ref 65–99)
Glucose-Capillary: 102 mg/dL — ABNORMAL HIGH (ref 65–99)
Glucose-Capillary: 128 mg/dL — ABNORMAL HIGH (ref 65–99)
Glucose-Capillary: 116 mg/dL — ABNORMAL HIGH (ref 65–99)

## 2015-10-21 LAB — MAGNESIUM: MAGNESIUM: 2.2 mg/dL (ref 1.7–2.4)

## 2015-10-21 MED ORDER — MIDAZOLAM HCL 2 MG/2ML IJ SOLN
INTRAMUSCULAR | Status: AC
  Filled 2015-10-21: qty 2

## 2015-10-21 MED ORDER — PROPOFOL BOLUS VIA INFUSION
100.0000 mg | Freq: Once | INTRAVENOUS | Status: AC
  Administered 2015-10-21: 100 mg via INTRAVENOUS

## 2015-10-21 MED ORDER — POTASSIUM CHLORIDE 20 MEQ/15ML (10%) PO SOLN
20.0000 meq | ORAL | Status: AC
  Administered 2015-10-21 (×2): 20 meq
  Filled 2015-10-21 (×2): qty 15

## 2015-10-21 NOTE — Progress Notes (Signed)
PULMONARY / CRITICAL CARE MEDICINE   Name: Joanna Farmer MRN: [id number] DOB: [birth year]    ADMISSION DATE:  09/29/2015 CONSULTATION DATE:  09/29/2015  REFERRING MD:  Ezequiel Essex, M.D. / [facility]  CHIEF COMPLAINT:  Altered mental status, thalamic bleed.  BRIEF   60 year old female past medical history as below, which is significant for hypertension, alcohol abuse, cocaine abuse, HSV, coronary artery disease status post stents to RCA and LAD in 2015, and ischemic cardiomyopathy with LV EF 35-40%. She presented to [facility] emergency department 6/15 with altered mental status. Reportedly the night before she suffered a fall during the night, and since that time has been sleeping all day. She was brought to the emergency department with this complaint. In emergency department she was unresponsive but reportedly breathing adequately. She had no spontaneous movement and left gaze deviation, but did have a positive gag reflex. CT scan of the head was obtained and demonstrated acute intracranial hemorrhage with intraventricular extension. Cervical spine was without radiographic evidence of injury. She was intubated for airway protection with anticipation of transfer to [facility]. PCCM accepting.  MICROBIOLOGY: MRSA PCR 6/15 >> (-) Blood Ctx x2 6/15 >> (-) Blood 6/20 > (-) Sputum 6/20 > (-) Urine 6/616- Citrobacter Urine 6/20 > (-)  ANTIBIOTICS: 6/15 Rocephin >>6/18 6/16 Acef (prophylactic abx) >> 6/18 Zosyn 6/20 > 7/2 (ONGOING FEVERS AND ? DRUG FEVER) Vanc 6/20 > 6/22  STUDIES:    LINES/TUBES: OETT 7.5 6/15 >> 6/24 replaced 6/24>> Foley 6/15 >> OGT 6/15 >> 6/24 PIV X3 IVC Drain 6/16 >>   SIGNIFICANT EVENTS: 6/15 - Admit to ICU on ventilator for ICH after transfer from [facility] ED 6/15 CT Head and neck: Acute intracranial hemorrhage with intraventricular extension and possible developing hydrocephalus Although no fractures identified in the cervical spine, the evaluation of the  cervical spine is limited. 6/19 CT head > no significant change since compared to prior scan. 6/20 CT Head   >>Degenerating RIGHT thalamus hematoma with local mass effect and edema. Similar mild hydrocephalus, stable RIGHT frontal ventriculostomy catheter. Degenerating intraventricular blood products.   6/25 Extubated successfully - re intubated   6/28: no sig change overnight.  CT stable.  Unable to wean cleviprex for HTN.   6/29: ongoing fevers withotu change in WBC. Moving right side. Doing SBt. Left side only flicker to pain occ per RN. Still on cleviprex - rn says new bp goal per NSGY is sbp < 140. Doing PSVT  10/15/15- per neurosurgery patient was brought per neurosurgery patient withdraws to stimulation intermittently and follows commands. X-ray ventricular drain appears to be functioning well. The plan is to possibly remove the drain on 10/17/2015. Ongoing fvers but biomarkers normal  10/17/15 - Patient following some commands this AM but clearly encephalopathic.  SUBJECTIVE/OVERNIGHT/INTERVAL HX No events overnight, unresponsive this AM.  VITAL SIGNS: BP 142/69 mmHg  Pulse 83  Temp(Src) 98.3 F (36.8 C) (Oral)  Resp 16  Ht 5\' 3"  (1.6 m)  Wt 47.5 kg (104 lb 11.5 oz)  BMI 18.55 kg/m2  SpO2 100%  HEMODYNAMICS:    VENTILATOR SETTINGS: Vent Mode:  [-] PRVC FiO2 (%):  [30 %] 30 % Set Rate:  [16 bmp] 16 bmp Vt Set:  [420 mL] 420 mL PEEP:  [5 cmH20] 5 cmH20 Pressure Support:  [5 cmH20] 5 cmH20 Plateau Pressure:  [13 cmH20-19 cmH20] 14 cmH20  INTAKE / OUTPUT: I/O last 3 completed shifts: In: 3002 [NG/GT:3002] Out: 1898 [Urine:1450; Drains:323; Stool:125]  PHYSICAL EXAMINATION:  General:  Ill appearing female, NAD with IVC drain. Integument:  Warm & dry. No rash on exposed skin.  HEENT:  Drain in place. No scleral injection or icterus. no significant oral secretions. Pupils 79mm, NR Cardiovascular:  RRR, no M/R/G and SEM at LUSB. Pulmonary:  Resps even non labored on PS  5/5, Coarse bilaterally.   Abdomen: Soft. Normal bowel sounds. Nondistended. TF at goal Musculoskeletal:  Normal bulk. No joint deformity or effusion appreciated. Neurological: NO sedation, . RASS-3 equivalent, unresponsive and not withdrawing to pain this AM.  LABS: PULMONARY  Recent Labs Lab 10/18/15 0231 10/21/15 0342  PHART 7.441 7.460*  PCO2ART 34.0* 35.3  PO2ART 123.0* 147.0*  HCO3 23.1 24.9*  TCO2 24 26  O2SAT 99.0 99.0   CBC  Recent Labs Lab 10/18/15 0344 10/20/15 0440 10/21/15 0338  HGB 8.7* 8.3* 7.9*  HCT 27.3* 25.8* 24.0*  WBC 6.7 7.0 6.1  PLT 307 265 258   COAGULATION  Recent Labs Lab 10/20/15 0833  INR 1.07    CARDIAC  No results for input(s): TROPONINI in the last 168 hours. No results for input(s): PROBNP in the last 168 hours.  CHEMISTRY  Recent Labs Lab 10/16/15 0356 10/17/15 0337 10/18/15 0344 10/20/15 0440 10/21/15 0338  NA 143 150* 144 140 136  K 3.2* 3.7 3.7 3.8 3.5  CL 109 107 113* 107 105  CO2 24 23 23 24 23   GLUCOSE 126* 129* 128* 121* 113*  BUN 21* 21* 23* 16 17  CREATININE 0.98 0.68 0.65 0.50 0.53  CALCIUM 9.5 11.2* 9.9 9.6 9.1  MG 2.5* 2.5* 2.5* 2.2 2.2  PHOS 4.5 3.7 4.4 3.8 3.9   Estimated Creatinine Clearance: 56.1 mL/min (by C-G formula based on Cr of 0.53).  LIVER  Recent Labs Lab 10/15/15 0331 10/20/15 0833  AST 25  --   ALT 46  --   ALKPHOS 35*  --   BILITOT 0.2*  --   PROT 7.8  --   ALBUMIN 3.0*  --   INR  --  1.07   INFECTIOUS  Recent Labs Lab 10/14/15 1255 10/15/15 0331 10/16/15 0356  LATICACIDVEN 0.7  --   --   PROCALCITON <0.10 <0.10 <0.10   ENDOCRINE CBG (last 3)   Recent Labs  10/20/15 2344 10/21/15 0353 10/21/15 0746  GLUCAP 122* 110* 96   IMAGING x48h Ct Head Wo Contrast  10/19/2015  CLINICAL DATA:  60 year old female with intracranial hemorrhage status post external ventricular drain placement. Ventriculomegaly. Initial encounter. EXAM: CT HEAD WITHOUT CONTRAST TECHNIQUE:  Contiguous axial images were obtained from the base of the skull through the vertex without intravenous contrast. COMPARISON:  10/13/2015 and earlier. FINDINGS: Stable visualized osseous structures. Paranasal sinuses and mastoids are stable and well pneumatized. No acute orbit or scalp soft tissue finding. Right frontal approach external ventricular drain re- demonstrated. The intracranial catheter tip abuts the septum pellucidum and appears to communicate with the right lateral ventricle as seen on series 207, image 28. Significant generalized ventricular enlargement since 10/13/2015, relatively sparing the fourth ventricle. Expected evolution of the 3-4 cm hyperdense central brain hemorrhage centered at the right cerebral peduncle. Hyperdense hemorrhage there has decreased to 35 x 28 mm versus 39 x 33 mm previously. Previously-seen small volume lateral intraventricular hemorrhage appears resolved. No intraventricular or extra-axial hemorrhage identified. No new intracranial hemorrhage. Hypodense edema surrounding the resolving parenchymal hematoma has not significantly changed. Stable gray-white matter differentiation elsewhere. No cortically based acute infarct identified. IMPRESSION: 1. Interval ventriculomegaly relatively sparing  the fourth ventricle, while the right frontal approach EVD positioning appears stable. Query EVD malfunction. 2. Expected evolution of the hyperdense hemorrhage centered at the right cerebral peduncle. Small volume of intraventricular hemorrhage has resolved. Electronically Signed   By: Genevie Ann M.D.   On: 10/19/2015 11:52   Dg Chest Port 1 View  10/21/2015  CLINICAL DATA:  Intubated patient, acute respiratory failure, pneumonia, intracranial hemorrhage with cerebral edema EXAM: PORTABLE CHEST 1 VIEW COMPARISON:  Portable chest x-ray of October 20, 2015 FINDINGS: The lungs are slightly less well inflated today. Bibasilar interstitial density is present. There is no significant pleural  effusion. The heart is top-normal in size. The pulmonary vascularity is normal. The endotracheal tube tip lies 5.2 cm above the carina. The feeding tube tip projects over the distal stomach or proximal duodenum. IMPRESSION: Bibasilar subsegmental atelectasis greater on the left. No pleural effusion or pulmonary edema. The support tubes are in stable position. Electronically Signed   By: David  Martinique M.D.   On: 10/21/2015 07:25   Dg Chest Port 1 View  10/20/2015  CLINICAL DATA:  Ischemic cardiomyopathy, suspect acute MI, known cardiac steps, respiratory failure and pneumonia, acute intracranial hemorrhage EXAM: PORTABLE CHEST 1 VIEW COMPARISON:  Portable chest x-ray of October 18, 2015 FINDINGS: The lungs are well-expanded. Infiltrates at both lung bases are less conspicuous. There is no significant pleural effusion and no pneumothorax. The heart is top-normal in size. The pulmonary vascularity is normal. There is calcification in the wall of the aortic arch. The endotracheal tube tip lies 4.7 cm above the carina. The feeding tube tip projects below the inferior margin of the image with multiple overlapping coils in the stomach. IMPRESSION: Slight interval improvement in the appearance of the lungs with decreased atelectasis or infiltrate at the bases. No CHF. Aortic atherosclerosis. Electronically Signed   By: David  Martinique M.D.   On: 10/20/2015 07:08   DISCUSSION: 60 year old female past medical history of coronary artery disease, ischemic cardiomyopathy, and polysubstance abuse being admitted with right thalamic intracranial hemorrhage. She is intubated for airway protection and was transferred to [facility] for ICU admission. Will maintain on ventilator with tight blood pressure control with Cardene infusion. Neurology and neurosurgery consulted.  IVC drain placed early am 6/16.  Poor prognosis.   ASSESSMENT / PLAN:  PULMONARY A: Acute Hypoxemic resp failure 2/2 Unable to Protect Airway - Secondary to  [place]. CXR 6/21 Possible LLL infiltrate, possible HCAP.   P:   Plan trach 7/7 at 10 AM. PS wean as able, anticipate will progress to TC quickly. Wean O2 for sats > 90%.  CARDIOVASCULAR A:  Chronic Systolic CHF - EF 123456 w/ grade 1 diastolic dysfunction. RV normal. Hypertension - s/p cleviprex ending 10/16/15 H/O CAD - S/P PCI in 2015 H/O Hyperlipidemia  P:  Tight BP control with SBP goal < 166mmHg Amlodipine 5mg  daily 6/29 changed on 10/16/15 to 10mg  qhs, will continue. Continue carvedilol  RENAL A:   Metabolic Acidosis - resolved Hypernatremia. Resolved 7/5  P:   Monitor renal function and UOP. Strict I&O. Correct electrolytes as indicated. Free water 250 ml q6 hours.  GASTROINTESTINAL A:   H/O GERD On TF and ppi RN reports diarrhea x 1 week on 78/2/17  P:   TF on hold for trach, restart post. Changed ppi to h2 blockade  HEMATOLOGIC A:   No acute issues. P:  Follow CBC SCDs No chemical DVT ppx given ICH  INFECTIOUS A:   H/O HSV Infection  7/4/17P - Persistent fever despite normal PCT, dc zosyn x48h and c diff negative. But WBC normal. Likely central fever  P:   Monitor off abx C.diff negative. NS to send sample of CSF for HSV.  ENDOCRINE CBG (last 3)   Recent Labs  10/20/15 2344 10/21/15 0353 10/21/15 0746  GLUCAP 122* 110* 96   A:   No acute issues P:   CBG's Q 4 hours SSI Follow glucose on BMP  NEUROLOGIC A:   Right Thalamic Intracranial Hemorrhage s/p IVC 6/16 Cocaine Abuse  H/O EtOH Abuse H/O Depression  P:  Sedation off  RASS goal: 0 but not achivevable prognostically Neurosurgery assistance appreciated, drain management as per their recs.  Blood pressure control as above. Poor prognosis   FAMILY  - Updates: Family updated bedside.  Proceed with trach today.  GLOBAL - needs trach, family reportedly agreed. Poor functional prognosis.  The patient is critically ill with multiple organ systems failure and requires high  complexity decision making for assessment and support, frequent evaluation and titration of therapies, application of advanced monitoring technologies and extensive interpretation of multiple databases.   Critical Care Time devoted to patient care services described in this note is  35  Minutes. This time reflects time of care of this signee Dr Jennet Maduro. This critical care time does not reflect procedure time, or teaching time or supervisory time of PA/NP/Med student/Med Resident etc but could involve care discussion time.  [name], M.D. [facility] Pulmonary/Critical Care Medicine. Pager: [phone]. After hours pager: [phone].

## 2015-10-21 NOTE — Procedures (Signed)
Bronchoscopy  for Percutaneous  Tracheostomy  Name: Joanna Farmer MRN: [id number] DOB: Sep 29, 1955 Procedure: Bronchoscopy for Percutaneous Tracheostomy Indications: Diagnostic evaluation of the airways In conjunction with: Dr. Nelda Farmer  Procedure Details Consent: Unable to obtain consent because of altered level of consciousness. Time Out: Verified patient identification, verified procedure, site/side was marked, verified correct patient position, special equipment/implants available, medications/allergies/relevent history reviewed, required imaging and test results available.  Performed  In preparation for procedure, patient was given 100% FiO2 and bronchoscope lubricated. Sedation: Benzodiazepines, Muscle relaxants and Etomidate  Airway entered and the following bronchi were examined: Trachea    Procedures performed: Endotracheal Tube retracted in 2 cm increments. Cannulation of airway observed. Dilation observed. Placement of trachel tube  observed . No overt complications. Bronchoscope removed.    Evaluation Hemodynamic Status: Transient hypertension requiring treatment; O2 sats: stable throughout Patient's Current Condition: stable Specimens:  None Complications: No apparent complications Patient did tolerate procedure well.   Joanna Farmer Minor ACNP Joanna Farmer PCCM Pager [phone] till 3 pm If no answer page [phone] 10/21/2015, 10:15 AM  Joanna Farmer, M.D. [facility] Pulmonary/Critical Care Medicine. Pager: [phone]. After hours pager: [phone].

## 2015-10-21 NOTE — Progress Notes (Signed)
[facility] ADULT ICU REPLACEMENT PROTOCOL FOR AM LAB REPLACEMENT ONLY  The patient does apply for the [facility] Adult ICU Electrolyte Replacment Protocol based on the criteria listed below:   1. Is GFR >/= 40 ml/min? Yes.    Patient's GFR today is >60 2. Is urine output >/= 0.5 ml/kg/hr for the last 6 hours? Yes.   Patient's UOP is 1.01 ml/kg/hr 3. Is BUN < 60 mg/dL? Yes.    Patient's BUN today is 17 4. Abnormal electrolyte(s)Potassium 3.5 5. Ordered repletion with: Potassium per protocol 6. If a panic level lab has been reported, has the CCM MD in charge been notified? No..   Physician:    Adam Phenix 10/21/2015 6:28 AM

## 2015-10-21 NOTE — Progress Notes (Signed)
Patient ID: Joanna Farmer, female   DOB: [birth year], 60 y.o.   MRN: [id number] BP 127/56 mmHg  Pulse 94  Temp(Src) 99.5 F (37.5 C) (Axillary)  Resp 19  Ht 5\' 3"  (1.6 m)  Wt 47.5 kg (104 lb 11.5 oz)  BMI 18.55 kg/m2  SpO2 100% Will open eyes to painful stimuli. Does not track, does not follow commands Gaze is conjugate +corneals, minimal pupillary response Spontaneous movement right lower extremity, extends left upper extremity with noxious stimuli, slight flexion on right side. No neurologic change, ventricular catheter draining well, icp normal range 8-10

## 2015-10-21 NOTE — Procedures (Signed)
Bedside Tracheostomy Insertion Procedure Note   Patient Details:   Name: AMELIYA FRATE DOB: 27-Oct-1955 MRN: [id number]  Procedure: Tracheostomy  Pre Procedure Assessment: ET Tube Size:7.5 ET Tube secured at lip (cm):21 Bite block in place: No Breath Sounds: Diminished  Post Procedure Assessment: BP 101/58 mmHg  Pulse 71  Temp(Src) 98.3 F (36.8 C) (Oral)  Resp 20  Ht 5\' 3"  (1.6 m)  Wt 104 lb 11.5 oz (47.5 kg)  BMI 18.55 kg/m2  SpO2 97% O2 sats: stable throughout Complications: No apparent complications Patient did tolerate procedure well Tracheostomy Brand:Shiley Tracheostomy Style:Cuffed Tracheostomy Size: 6.0 cuffed Tracheostomy Secured [id number], velcro Tracheostomy Placement Confirmation:Trach cuff visualized and in place, cxr taken for placement    Readling, Lulu Riding 10/21/2015, 11:11 AM  [name], M.D. [facility] Pulmonary/Critical Care Medicine. Pager: [phone]. After hours pager: [phone].

## 2015-10-21 NOTE — Procedures (Signed)
Percutaneous Tracheostomy Placement  Consent from family.  Patient sedated, paralyzed and position.  Placed on 100% FiO2 and RR matched.  Area cleaned and draped.  Lidocaine/epi injected.  Skin incision done followed by blunt dissection.  Trachea palpated then punctured, catheter passed and visualized bronchoscopically.  Wire placed and visualized.  Catheter removed.  Airway then entered and dilated.  Size 6 cuffed shiley trach placed and visualized bronchoscopically well above carina.  Good volume returns.  Patient tolerated the procedure well without complications.  Minimal blood loss.  CXR ordered and pending.  Wesam G. Yacoub, M.D. [place] Pulmonary/Critical Care Medicine. Pager: [phone]. After hours pager: [phone].  

## 2015-10-21 NOTE — Progress Notes (Signed)
   10/21/15 0915  Clinical Encounter Type  Visited With Patient  Visit Type Initial  Referral From Nurse  On morning rounds, chaplain consulted with charge nurse who referred patient to the chaplain.  Patient unconscious. Chaplain prayed for patient.

## 2015-10-22 LAB — CBC
HEMATOCRIT: 24.9 % — AB (ref 36.0–46.0)
HEMOGLOBIN: 8 g/dL — AB (ref 12.0–15.0)
MCH: 31.5 pg (ref 26.0–34.0)
MCHC: 32.1 g/dL (ref 30.0–36.0)
MCV: 98 fL (ref 78.0–100.0)
Platelets: 268 10*3/uL (ref 150–400)
RBC: 2.54 MIL/uL — AB (ref 3.87–5.11)
RDW: 13.5 % (ref 11.5–15.5)
WBC: 9 10*3/uL (ref 4.0–10.5)

## 2015-10-22 LAB — BASIC METABOLIC PANEL
ANION GAP: 8 (ref 5–15)
BUN: 22 mg/dL — ABNORMAL HIGH (ref 6–20)
CALCIUM: 9.6 mg/dL (ref 8.9–10.3)
CO2: 23 mmol/L (ref 22–32)
Chloride: 106 mmol/L (ref 101–111)
Creatinine, Ser: 0.57 mg/dL (ref 0.44–1.00)
GFR calc non Af Amer: >60 mL/min (ref >60)
GLUCOSE: 140 mg/dL — AB (ref 65–99)
POTASSIUM: 4.3 mmol/L (ref 3.5–5.1)
Sodium: 137 mmol/L (ref 135–145)

## 2015-10-22 LAB — GLUCOSE, CAPILLARY
Glucose-Capillary: 130 mg/dL — ABNORMAL HIGH (ref 65–99)
Glucose-Capillary: 133 mg/dL — ABNORMAL HIGH (ref 65–99)
Glucose-Capillary: 135 mg/dL — ABNORMAL HIGH (ref 65–99)
Glucose-Capillary: 126 mg/dL — ABNORMAL HIGH (ref 65–99)
Glucose-Capillary: 114 mg/dL — ABNORMAL HIGH (ref 65–99)
Glucose-Capillary: 115 mg/dL — ABNORMAL HIGH (ref 65–99)

## 2015-10-22 LAB — MAGNESIUM: MAGNESIUM: 2.2 mg/dL (ref 1.7–2.4)

## 2015-10-22 LAB — PHOSPHORUS: PHOSPHORUS: 3.7 mg/dL (ref 2.5–4.6)

## 2015-10-22 MED ORDER — SODIUM CHLORIDE 0.9 % IV SOLN
500.0000 mg | Freq: Two times a day (BID) | INTRAVENOUS | Status: DC
  Administered 2015-10-22 – 2015-11-10 (×40): 500 mg via INTRAVENOUS
  Filled 2015-10-22 (×43): qty 5

## 2015-10-22 NOTE — Progress Notes (Signed)
[facility] Progress Note Patient Name: DEBRAANN DILLER DOB: [birth year] MRN: [id number]   Date of Service  10/22/2015  HPI/Events of Note  R shoulder, neck and face twitching - seizure vs myoclonus. Now resolved.   eICU Interventions  Will start Keppra 500 mg IV Q 12 hours.      Intervention Category Major Interventions: Seizures - evaluation and management  Sommer,Steven Eugene 10/22/2015, 12:55 AM

## 2015-10-22 NOTE — Progress Notes (Signed)
PULMONARY / CRITICAL CARE MEDICINE   Name: Joanna Farmer MRN: [id number] DOB: 08-05-1955    ADMISSION DATE:  09/29/2015 CONSULTATION DATE:  09/29/2015  REFERRING MD:  Ezequiel Essex, M.D. / [facility]  CHIEF COMPLAINT:  Altered mental status, thalamic bleed.  BRIEF   60 year old female past medical history as below, which is significant for hypertension, alcohol abuse, cocaine abuse, HSV, coronary artery disease status post stents to RCA and LAD in 2015, and ischemic cardiomyopathy with LV EF 35-40%. She presented to [facility] emergency department 6/15 with altered mental status. Reportedly the night before she suffered a fall during the night, and since that time has been sleeping all day. She was brought to the emergency department with this complaint. In emergency department she was unresponsive but reportedly breathing adequately. She had no spontaneous movement and left gaze deviation, but did have a positive gag reflex. CT scan of the head was obtained and demonstrated acute intracranial hemorrhage with intraventricular extension. Cervical spine was without radiographic evidence of injury. She was intubated for airway protection with anticipation of transfer to [facility]. PCCM accepting.  MICROBIOLOGY: MRSA PCR 6/15 >> (-) Blood Ctx x2 6/15 >> (-) Blood 6/20 > (-) Sputum 6/20 > (-) Urine 6/616- Citrobacter Urine 6/20 > (-)  ANTIBIOTICS: 6/15 Rocephin >>6/18 6/16 Acef (prophylactic abx) >> 6/18 Zosyn 6/20 > 7/2 (ONGOING FEVERS AND ? DRUG FEVER) Vanc 6/20 > 6/22  STUDIES:    LINES/TUBES: OETT 7.5 6/15 >> 6/24 replaced 6/24>> Foley 6/15 >> OGT 6/15 >> 6/24 PIV X3 IVC Drain 6/16 >>   SIGNIFICANT EVENTS: 6/15 - Admit to ICU on ventilator for ICH after transfer from [facility] ED 6/15 CT Head and neck: Acute intracranial hemorrhage with intraventricular extension and possible developing hydrocephalus Although no fractures identified in the cervical spine, the evaluation of the  cervical spine is limited. 6/19 CT head > no significant change since compared to prior scan. 6/20 CT Head   >>Degenerating RIGHT thalamus hematoma with local mass effect and edema. Similar mild hydrocephalus, stable RIGHT frontal ventriculostomy catheter. Degenerating intraventricular blood products.   6/25 Extubated successfully - re intubated   6/28: no sig change overnight.  CT stable.  Unable to wean cleviprex for HTN.   6/29: ongoing fevers withotu change in WBC. Moving right side. Doing SBt. Left side only flicker to pain occ per RN. Still on cleviprex - rn says new bp goal per NSGY is sbp < 140. Doing PSVT  10/15/15- per neurosurgery patient was brought per neurosurgery patient withdraws to stimulation intermittently and follows commands. X-ray ventricular drain appears to be functioning well. The plan is to possibly remove the drain on 10/17/2015. Ongoing fvers but biomarkers normal  10/17/15 - Patient following some commands this AM but clearly encephalopathic.  SUBJECTIVE/OVERNIGHT/INTERVAL HX No events overnight, remains unresponsive, on TC but requiring 10/5.  VITAL SIGNS: BP 113/51 mmHg  Pulse 83  Temp(Src) 99.3 F (37.4 C) (Axillary)  Resp 18  Ht 5\' 3"  (1.6 m)  Wt 47.7 kg (105 lb 2.6 oz)  BMI 18.63 kg/m2  SpO2 100%  HEMODYNAMICS:    VENTILATOR SETTINGS: Vent Mode:  [-] PRVC FiO2 (%):  [30 %] 30 % Set Rate:  [16 bmp] 16 bmp Vt Set:  [420 mL] 420 mL PEEP:  [5 cmH20] 5 cmH20 Pressure Support:  [10 cmH20] 10 cmH20 Plateau Pressure:  [11 cmH20-16 cmH20] 12 cmH20  INTAKE / OUTPUT: I/O last 3 completed shifts: In: 2724 [I.V.:30; Other:80; [id number]; IV Piggyback:100]  Out: 2087 [Urine:1635; Drains:327; Stool:125]  PHYSICAL EXAMINATION: General:  Ill appearing female, NAD with IVC drain. Integument:  Warm & dry. No rash on exposed skin.  HEENT:  Drain in place. No scleral injection or icterus. no significant oral secretions. Pupils 14mm, NR Cardiovascular:  RRR,  no M/R/G and SEM at LUSB. Pulmonary:  Resps even non labored on PS 5/5, Coarse bilaterally.   Abdomen: Soft. Normal bowel sounds. Nondistended. TF at goal Musculoskeletal:  Normal bulk. No joint deformity or effusion appreciated. Neurological: NO sedation, . RASS-3 equivalent, unresponsive and not withdrawing to pain.  LABS: PULMONARY  Recent Labs Lab 10/18/15 0231 10/21/15 0342  PHART 7.441 7.460*  PCO2ART 34.0* 35.3  PO2ART 123.0* 147.0*  HCO3 23.1 24.9*  TCO2 24 26  O2SAT 99.0 99.0   CBC  Recent Labs Lab 10/20/15 0440 10/21/15 0338 10/22/15 0231  HGB 8.3* 7.9* 8.0*  HCT 25.8* 24.0* 24.9*  WBC 7.0 6.1 9.0  PLT 265 258 268   COAGULATION  Recent Labs Lab 10/20/15 0833  INR 1.07    CARDIAC  No results for input(s): TROPONINI in the last 168 hours. No results for input(s): PROBNP in the last 168 hours.  CHEMISTRY  Recent Labs Lab 10/17/15 0337 10/18/15 0344 10/20/15 0440 10/21/15 0338 10/22/15 0231  NA 150* 144 140 136 137  K 3.7 3.7 3.8 3.5 4.3  CL 107 113* 107 105 106  CO2 23 23 24 23 23   GLUCOSE 129* 128* 121* 113* 140*  BUN 21* 23* 16 17 22*  CREATININE 0.68 0.65 0.50 0.53 0.57  CALCIUM 11.2* 9.9 9.6 9.1 9.6  MG 2.5* 2.5* 2.2 2.2 2.2  PHOS 3.7 4.4 3.8 3.9 3.7   Estimated Creatinine Clearance: 56.3 mL/min (by C-G formula based on Cr of 0.57).  LIVER  Recent Labs Lab 10/20/15 0833  INR 1.07   INFECTIOUS  Recent Labs Lab 10/16/15 0356  PROCALCITON <0.10   ENDOCRINE CBG (last 3)   Recent Labs  10/21/15 2323 10/22/15 0318 10/22/15 0741  GLUCAP 116* 130* 133*   IMAGING x48h Dg Chest Port 1 View  10/21/2015  CLINICAL DATA:  Respiratory failure, new tracheostomy tube EXAM: PORTABLE CHEST 1 VIEW COMPARISON:  10/21/2015 FINDINGS: Cardiac shadow is within normal limits. The lungs are well aerated. A feeding catheter is again identified. New tracheostomy tube is noted in satisfactory position. Patchy changes in the right base are noted  slightly increased from the prior exam. IMPRESSION: Slight increase in degree of right basilar atelectasis. New tracheostomy tube. Electronically Signed   By: Inez Catalina M.D.   On: 10/21/2015 11:05   Dg Chest Port 1 View  10/21/2015  CLINICAL DATA:  Intubated patient, acute respiratory failure, pneumonia, intracranial hemorrhage with cerebral edema EXAM: PORTABLE CHEST 1 VIEW COMPARISON:  Portable chest x-ray of October 20, 2015 FINDINGS: The lungs are slightly less well inflated today. Bibasilar interstitial density is present. There is no significant pleural effusion. The heart is top-normal in size. The pulmonary vascularity is normal. The endotracheal tube tip lies 5.2 cm above the carina. The feeding tube tip projects over the distal stomach or proximal duodenum. IMPRESSION: Bibasilar subsegmental atelectasis greater on the left. No pleural effusion or pulmonary edema. The support tubes are in stable position. Electronically Signed   By: David  Martinique M.D.   On: 10/21/2015 07:25   Dg Abd Portable 1v  10/21/2015  CLINICAL DATA:  Check feeding catheter placement EXAM: PORTABLE ABDOMEN - 1 VIEW COMPARISON:  10/08/2015 FINDINGS: Scattered large and  small bowel gas is noted. A feeding catheter is noted coiled within the stomach with the tip directed towards the pyloric channel. This is similar in appearance to that seen on the prior exam. IMPRESSION: Feeding catheter in the distal stomach as described. Electronically Signed   By: Inez Catalina M.D.   On: 10/21/2015 11:04   DISCUSSION: 60 year old female past medical history of coronary artery disease, ischemic cardiomyopathy, and polysubstance abuse being admitted with right thalamic intracranial hemorrhage. She is intubated for airway protection and was transferred to [facility] for ICU admission. Will maintain on ventilator with tight blood pressure control with Cardene infusion. Neurology and neurosurgery consulted.  IVC drain placed early am 6/16.  Poor  prognosis.   ASSESSMENT / PLAN:  PULMONARY A: Acute Hypoxemic resp failure 2/2 Unable to Protect Airway - Secondary to [place]. CXR 6/21 Possible LLL infiltrate, possible HCAP.   P:   PS as able, hope to get down to 5/5 today, currently needing 10/5, full vent support at night. Wean O2 for sats > 90%. Hope is by Monday she would be on TC, will need a PEG tube early next week.  CARDIOVASCULAR A:  Chronic Systolic CHF - EF 123456 w/ grade 1 diastolic dysfunction. RV normal. Hypertension - s/p cleviprex ending 10/16/15 H/O CAD - S/P PCI in 2015 H/O Hyperlipidemia  P:  Tight BP control with SBP goal < 160mmHg Amlodipine 5mg  daily 6/29 changed on 10/16/15 to 10mg  qhs, will continue. Continue carvedilol  RENAL A:   Metabolic Acidosis - resolved Hypernatremia. Resolved 7/5  P:   Monitor renal function and UOP. Strict I&O. Correct electrolytes as indicated. Free water 250 ml q6 hours. Maintain fluid even at this point.  GASTROINTESTINAL A:   H/O GERD On TF and ppi RN reports diarrhea x 1 week on 78/2/17  P:   Resume TF. Changed ppi to h2 blockade, continue  HEMATOLOGIC A:   No acute issues. P:  Follow CBC SCDs No chemical DVT ppx given ICH  INFECTIOUS A:   H/O HSV Infection. Fever normalized.  P:   Monitor off abx C.diff negative. NS to send sample of CSF for HSV, communicated with Dr. Ronnald Ramp, will do 7/9. Will not add acyclovir as patient is no longer febrile and WBC has normalized, Dr. Ronnald Ramp does not feel that HSV is very likely.  ENDOCRINE CBG (last 3)   Recent Labs  10/21/15 2323 10/22/15 0318 10/22/15 0741  GLUCAP 116* 130* 133*   A:   No acute issues P:   CBG's Q 4 hours SSI Follow glucose on BMP  NEUROLOGIC A:   Right Thalamic Intracranial Hemorrhage s/p IVC 6/16 Cocaine Abuse  H/O EtOH Abuse H/O Depression  P:  Sedation off  RASS goal: 0 but not achivevable prognostically Neurosurgery assistance appreciated, drain management as per  their recs.  Blood pressure control as above. Poor prognosis   FAMILY  - Updates: No family bedside.  Proceed with weaning, given neuro status will need a PEG next week.  GLOBAL - needs trach, family reportedly agreed. Poor functional prognosis.  The patient is critically ill with multiple organ systems failure and requires high complexity decision making for assessment and support, frequent evaluation and titration of therapies, application of advanced monitoring technologies and extensive interpretation of multiple databases.   Critical Care Time devoted to patient care services described in this note is  35  Minutes. This time reflects time of care of this signee Dr Jennet Maduro. This critical care time does  not reflect procedure time, or teaching time or supervisory time of PA/NP/Med student/Med Resident etc but could involve care discussion time.  [name], M.D. [facility] Pulmonary/Critical Care Medicine. Pager: [phone]. After hours pager: [phone].

## 2015-10-22 NOTE — Progress Notes (Signed)
Patient ID: Joanna Farmer, female   DOB: Jul 26, 1955, 60 y.o.   MRN: [id number] No change in neurologic exam. Ventriculostomy remains patent at 10 cm with good drainage.

## 2015-10-23 LAB — GLUCOSE, CAPILLARY
GLUCOSE-CAPILLARY: 125 mg/dL — AB (ref 65–99)
GLUCOSE-CAPILLARY: 118 mg/dL — AB (ref 65–99)
Glucose-Capillary: 119 mg/dL — ABNORMAL HIGH (ref 65–99)
Glucose-Capillary: 131 mg/dL — ABNORMAL HIGH (ref 65–99)
Glucose-Capillary: 113 mg/dL — ABNORMAL HIGH (ref 65–99)

## 2015-10-23 LAB — BASIC METABOLIC PANEL
Anion gap: 7 (ref 5–15)
BUN: 16 mg/dL (ref 6–20)
CALCIUM: 9.7 mg/dL (ref 8.9–10.3)
CHLORIDE: 104 mmol/L (ref 101–111)
CO2: 23 mmol/L (ref 22–32)
CREATININE: 0.47 mg/dL (ref 0.44–1.00)
GFR calc Af Amer: >60 mL/min (ref >60)
Glucose, Bld: 122 mg/dL — ABNORMAL HIGH (ref 65–99)
Potassium: 4.1 mmol/L (ref 3.5–5.1)
SODIUM: 134 mmol/L — AB (ref 135–145)

## 2015-10-23 LAB — CBC
HCT: 25.5 % — ABNORMAL LOW (ref 36.0–46.0)
Hemoglobin: 8.5 g/dL — ABNORMAL LOW (ref 12.0–15.0)
MCH: 32.8 pg (ref 26.0–34.0)
MCHC: 33.3 g/dL (ref 30.0–36.0)
MCV: 98.5 fL (ref 78.0–100.0)
PLATELETS: 239 10*3/uL (ref 150–400)
RBC: 2.59 MIL/uL — ABNORMAL LOW (ref 3.87–5.11)
RDW: 13.4 % (ref 11.5–15.5)
WBC: 8.3 10*3/uL (ref 4.0–10.5)

## 2015-10-23 LAB — CSF CELL COUNT WITH DIFFERENTIAL
RBC COUNT CSF: 126 /mm3 — AB
WBC, CSF: 2 /mm3 (ref 0–5)

## 2015-10-23 LAB — PROTEIN AND GLUCOSE, CSF
Glucose, CSF: 69 mg/dL (ref 40–70)
Total  Protein, CSF: 30 mg/dL (ref 15–45)

## 2015-10-23 LAB — PHOSPHORUS: Phosphorus: 4.3 mg/dL (ref 2.5–4.6)

## 2015-10-23 LAB — MAGNESIUM: Magnesium: 2 mg/dL (ref 1.7–2.4)

## 2015-10-23 MED ORDER — MIDAZOLAM HCL 2 MG/2ML IJ SOLN
INTRAMUSCULAR | Status: AC
  Administered 2015-10-23: 2 mg
  Filled 2015-10-23: qty 2

## 2015-10-23 MED ORDER — MIDAZOLAM HCL 2 MG/2ML IJ SOLN: 2.0000 mg | INTRAMUSCULAR | Status: DC | PRN

## 2015-10-23 NOTE — Progress Notes (Signed)
PULMONARY / CRITICAL CARE MEDICINE   Name: Joanna Farmer MRN: [id number] DOB: [birth year]    ADMISSION DATE:  09/29/2015 CONSULTATION DATE:  09/29/2015  REFERRING MD:  Ezequiel Essex, M.D. / [facility]  CHIEF COMPLAINT:  Altered mental status, thalamic bleed.  BRIEF   60 year old female past medical history as below, which is significant for hypertension, alcohol abuse, cocaine abuse, HSV, coronary artery disease status post stents to RCA and LAD in 2015, and ischemic cardiomyopathy with LV EF 35-40%. She presented to [facility] emergency department 6/15 with altered mental status. Reportedly the night before she suffered a fall during the night, and since that time has been sleeping all day. She was brought to the emergency department with this complaint. In emergency department she was unresponsive but reportedly breathing adequately. She had no spontaneous movement and left gaze deviation, but did have a positive gag reflex. CT scan of the head was obtained and demonstrated acute intracranial hemorrhage with intraventricular extension. Cervical spine was without radiographic evidence of injury. She was intubated for airway protection with anticipation of transfer to [facility]. PCCM accepting.  MICROBIOLOGY: MRSA PCR 6/15 >> (-) Blood Ctx x2 6/15 >> (-) Blood 6/20 > (-) Sputum 6/20 > (-) Urine 6/616- Citrobacter Urine 6/20 > (-)  ANTIBIOTICS: 6/15 Rocephin >>6/18 6/16 Acef (prophylactic abx) >> 6/18 Zosyn 6/20 > 7/2 (ONGOING FEVERS AND ? DRUG FEVER) Vanc 6/20 > 6/22  STUDIES:    LINES/TUBES: OETT 7.5 6/15 >> 6/24 replaced 6/24>> Foley 6/15 >> OGT 6/15 >> 6/24 PIV X3 IVC Drain 6/16 >>   SIGNIFICANT EVENTS: 6/15 - Admit to ICU on ventilator for ICH after transfer from [facility] ED 6/15 CT Head and neck: Acute intracranial hemorrhage with intraventricular extension and possible developing hydrocephalus Although no fractures identified in the cervical spine, the evaluation of the  cervical spine is limited. 6/19 CT head > no significant change since compared to prior scan. 6/20 CT Head   >>Degenerating RIGHT thalamus hematoma with local mass effect and edema. Similar mild hydrocephalus, stable RIGHT frontal ventriculostomy catheter. Degenerating intraventricular blood products.   6/25 Extubated successfully - re intubated   6/28: no sig change overnight.  CT stable.  Unable to wean cleviprex for HTN.   6/29: ongoing fevers withotu change in WBC. Moving right side. Doing SBt. Left side only flicker to pain occ per RN. Still on cleviprex - rn says new bp goal per NSGY is sbp < 140. Doing PSVT  10/15/15- per neurosurgery patient was brought per neurosurgery patient withdraws to stimulation intermittently and follows commands. X-ray ventricular drain appears to be functioning well. The plan is to possibly remove the drain on 10/17/2015. Ongoing fvers but biomarkers normal  10/17/15 - Patient following some commands this AM but clearly encephalopathic.  SUBJECTIVE/OVERNIGHT/INTERVAL HX No events overnight, weaning well.  VITAL SIGNS: BP 131/62 mmHg  Pulse 88  Temp(Src) 99.8 F (37.7 C) (Axillary)  Resp 21  Ht 5\' 3"  (1.6 m)  Wt 47.1 kg (103 lb 13.4 oz)  BMI 18.40 kg/m2  SpO2 99%  HEMODYNAMICS:    VENTILATOR SETTINGS: Vent Mode:  [-] PSV;CPAP FiO2 (%):  [30 %] 30 % Set Rate:  [16 bmp] 16 bmp Vt Set:  [420 mL] 420 mL PEEP:  [5 cmH20] 5 cmH20 Pressure Support:  [5 cmH20] 5 cmH20 Plateau Pressure:  [12 cmH20-15 cmH20] 14 cmH20  INTAKE / OUTPUT: I/O last 3 completed shifts: In: 58 [Other:80; [id number]; IV Piggyback:310] Out: 2570 [Urine:2240; Drains:330]  PHYSICAL  EXAMINATION: General:  Ill appearing female, NAD with IVC drain, does not withdraws to pain. Integument:  Warm & dry. No rash on exposed skin.  HEENT:  Drain in place. No scleral injection or icterus. no significant oral secretions. Pupils 71mm, NR. Cardiovascular:  RRR, no M/R/G and SEM at  LUSB. Pulmonary:  Resps even non labored on PS 5/5, Coarse bilaterally.   Abdomen: Soft. Normal bowel sounds. Nondistended. TF at goal Musculoskeletal:  Normal bulk. No joint deformity or effusion appreciated. Neurological: NO sedation. RASS-3 equivalent, unresponsive and not withdrawing to pain.  LABS: PULMONARY  Recent Labs Lab 10/18/15 0231 10/21/15 0342  PHART 7.441 7.460*  PCO2ART 34.0* 35.3  PO2ART 123.0* 147.0*  HCO3 23.1 24.9*  TCO2 24 26  O2SAT 99.0 99.0   CBC  Recent Labs Lab 10/21/15 0338 10/22/15 0231 10/23/15 0418  HGB 7.9* 8.0* 8.5*  HCT 24.0* 24.9* 25.5*  WBC 6.1 9.0 8.3  PLT 258 268 239   COAGULATION  Recent Labs Lab 10/20/15 0833  INR 1.07   CARDIAC  No results for input(s): TROPONINI in the last 168 hours. No results for input(s): PROBNP in the last 168 hours.  CHEMISTRY  Recent Labs Lab 10/18/15 0344 10/20/15 0440 10/21/15 0338 10/22/15 0231 10/23/15 0418  NA 144 140 136 137 134*  K 3.7 3.8 3.5 4.3 4.1  CL 113* 107 105 106 104  CO2 23 24 23 23 23   GLUCOSE 128* 121* 113* 140* 122*  BUN 23* 16 17 22* 16  CREATININE 0.65 0.50 0.53 0.57 0.47  CALCIUM 9.9 9.6 9.1 9.6 9.7  MG 2.5* 2.2 2.2 2.2 2.0  PHOS 4.4 3.8 3.9 3.7 4.3   Estimated Creatinine Clearance: 55.6 mL/min (by C-G formula based on Cr of 0.47).  LIVER  Recent Labs Lab 10/20/15 0833  INR 1.07   INFECTIOUS No results for input(s): LATICACIDVEN, PROCALCITON in the last 168 hours. ENDOCRINE CBG (last 3)   Recent Labs  10/22/15 2309 10/23/15 0311 10/23/15 0745  GLUCAP 115* 125* 119*   IMAGING x48h No results found.   DISCUSSION: 60 year old female past medical history of coronary artery disease, ischemic cardiomyopathy, and polysubstance abuse being admitted with right thalamic intracranial hemorrhage. She is intubated for airway protection and was transferred to [facility] for ICU admission. Will maintain on ventilator with tight blood pressure control with  Cardene infusion. Neurology and neurosurgery consulted.  IVC drain placed early am 6/16.  Poor prognosis.   ASSESSMENT / PLAN:  PULMONARY A: Acute Hypoxemic resp failure 2/2 Unable to Protect Airway - Secondary to [place]. CXR 6/21 Possible LLL infiltrate, possible HCAP.   P:   Proceed with TC if able today. Wean O2 for sats > 90%. F/U CXR as needed.  CARDIOVASCULAR A:  Chronic Systolic CHF - EF 123456 w/ grade 1 diastolic dysfunction. RV normal. Hypertension - s/p cleviprex ending 10/16/15 H/O CAD - S/P PCI in 2015 H/O Hyperlipidemia  P:  BP control with SBP goal < 160 mmHg. Amlodipine 10 mg daily. Carvedilol 12.5 mg BID. PRN hydralazine.  RENAL A:   Metabolic Acidosis - resolved Hypernatremia. Resolved 7/5  P:   Monitor renal function and UOP. Strict I&O. Correct electrolytes as indicated. D/C free water. Maintain fluid even at this point.  GASTROINTESTINAL A:   H/O GERD On TF and ppi RN reports diarrhea x 1 week on 78/2/17  P:   Continue TF. Changed ppi to h2 blockade, continue.  HEMATOLOGIC A:   No acute issues. P:  Follow CBC.  SCDs. No chemical DVT ppx given ICH.  INFECTIOUS A:   H/O HSV Infection. Fever normalized.  P:   Monitor off abx. C.diff negative. NS to send sample of CSF for HSV, communicated with Dr. Ronnald Ramp, will do 7/9. Will not add acyclovir as patient is no longer febrile and WBC has normalized, Dr. Ronnald Ramp does not feel that HSV is very likely.  ENDOCRINE CBG (last 3)   Recent Labs  10/22/15 2309 10/23/15 0311 10/23/15 0745  GLUCAP 115* 125* 119*   A:   No acute issues P:   CBG's Q 4 hours SSI Follow glucose on BMP  NEUROLOGIC A:   Right Thalamic Intracranial Hemorrhage s/p IVC 6/16 Cocaine Abuse  H/O EtOH Abuse H/O Depression  P:  Sedation off  RASS goal: 0 but not achivevable prognostically Neurosurgery assistance appreciated, drain management as per their recs.  Blood pressure control as above. Poor prognosis    FAMILY  - Updates: No family bedside.  Proceed with weaning, given neuro status will need a PEG next week.  GLOBAL - Trach in place.  Weaning, advance to TC as able.  Hopefully overnight, but will need PEG regardless due to neuro status.  The patient is critically ill with multiple organ systems failure and requires high complexity decision making for assessment and support, frequent evaluation and titration of therapies, application of advanced monitoring technologies and extensive interpretation of multiple databases.   Critical Care Time devoted to patient care services described in this note is  35  Minutes. This time reflects time of care of this signee Dr Jennet Maduro. This critical care time does not reflect procedure time, or teaching time or supervisory time of PA/NP/Med student/Med Resident etc but could involve care discussion time.  [name], M.D. [facility] Pulmonary/Critical Care Medicine. Pager: [phone]. After hours pager: [phone].

## 2015-10-23 NOTE — Progress Notes (Signed)
eLink Physician-Brief Progress Note Patient Name: Joanna Farmer DOB: 24-Jul-1955 MRN: [id number]   Date of Service  10/23/2015  HPI/Events of Note  Myoclonus vs seizures. Already on Keppra.   eICU Interventions  Will order: 1. Versed 2 mg IV Q 1 hour PRN. 2. Notify Neurosurgery.      Intervention Category Major Interventions: Seizures - evaluation and management  Sommer,Steven Eugene 10/23/2015, 6:16 AM

## 2015-10-23 NOTE — Progress Notes (Signed)
Patient ID: Joanna Farmer, female   DOB: October 03, 1955, 60 y.o.   MRN: [id number] No change in exam. Ventriculostomy patent with clear CSF. 10 mL of CSF sent for studies

## 2015-10-24 LAB — BASIC METABOLIC PANEL
Anion gap: 7 (ref 5–15)
BUN: 13 mg/dL (ref 6–20)
CO2: 26 mmol/L (ref 22–32)
CREATININE: 0.46 mg/dL (ref 0.44–1.00)
Calcium: 10.1 mg/dL (ref 8.9–10.3)
Chloride: 106 mmol/L (ref 101–111)
GFR calc Af Amer: >60 mL/min (ref >60)
GLUCOSE: 140 mg/dL — AB (ref 65–99)
Potassium: 4 mmol/L (ref 3.5–5.1)
SODIUM: 139 mmol/L (ref 135–145)

## 2015-10-24 LAB — GLUCOSE, CAPILLARY
GLUCOSE-CAPILLARY: 130 mg/dL — AB (ref 65–99)
GLUCOSE-CAPILLARY: 156 mg/dL — AB (ref 65–99)
GLUCOSE-CAPILLARY: 126 mg/dL — AB (ref 65–99)
Glucose-Capillary: 121 mg/dL — ABNORMAL HIGH (ref 65–99)
Glucose-Capillary: 125 mg/dL — ABNORMAL HIGH (ref 65–99)
Glucose-Capillary: 129 mg/dL — ABNORMAL HIGH (ref 65–99)
Glucose-Capillary: 140 mg/dL — ABNORMAL HIGH (ref 65–99)

## 2015-10-24 LAB — CBC
HCT: 27.4 % — ABNORMAL LOW (ref 36.0–46.0)
Hemoglobin: 8.7 g/dL — ABNORMAL LOW (ref 12.0–15.0)
MCH: 31.1 pg (ref 26.0–34.0)
MCHC: 31.8 g/dL (ref 30.0–36.0)
MCV: 97.9 fL (ref 78.0–100.0)
PLATELETS: 259 10*3/uL (ref 150–400)
RBC: 2.8 MIL/uL — ABNORMAL LOW (ref 3.87–5.11)
RDW: 13.4 % (ref 11.5–15.5)
WBC: 6.8 10*3/uL (ref 4.0–10.5)

## 2015-10-24 LAB — MAGNESIUM: MAGNESIUM: 2.1 mg/dL (ref 1.7–2.4)

## 2015-10-24 LAB — PHOSPHORUS: Phosphorus: 4.3 mg/dL (ref 2.5–4.6)

## 2015-10-24 MED ORDER — PRO-STAT SUGAR FREE PO LIQD
30.0000 mL | Freq: Every day | ORAL | Status: DC
  Administered 2015-10-24 – 2015-11-10 (×18): 30 mL
  Filled 2015-10-24 (×18): qty 30

## 2015-10-24 MED ORDER — OSMOLITE 1.2 CAL PO LIQD
1000.0000 mL | ORAL | Status: DC
  Administered 2015-10-24 – 2015-11-10 (×18): 1000 mL
  Filled 2015-10-24 (×24): qty 1000

## 2015-10-24 MED ORDER — FREE WATER
100.0000 mL | Freq: Three times a day (TID) | Status: DC
  Administered 2015-10-24 – 2015-11-02 (×24): 100 mL

## 2015-10-24 NOTE — Progress Notes (Signed)
No acute events CSF studies reassuring Grimaces to pain Localizes right, trace withdrawal on left EVD to monitor Will shunt at end of week if can't remove EVD

## 2015-10-24 NOTE — Progress Notes (Signed)
PULMONARY / CRITICAL CARE MEDICINE   Name: Joanna Farmer MRN: [id number] DOB: June 19, 1955    ADMISSION DATE:  09/29/2015 CONSULTATION DATE:  09/29/2015  REFERRING MD:  Ezequiel Essex, M.D. / [facility]  CHIEF COMPLAINT:  Altered mental status, thalamic bleed.  BRIEF   60 year old female past medical history as below, which is significant for hypertension, alcohol abuse, cocaine abuse, HSV, coronary artery disease status post stents to RCA and LAD in 2015, and ischemic cardiomyopathy with LV EF 35-40%. She presented to [facility] emergency department 6/15 with altered mental status. Reportedly the night before she suffered a fall during the night, and since that time has been sleeping all day. She was brought to the emergency department with this complaint. In emergency department she was unresponsive but reportedly breathing adequately. She had no spontaneous movement and left gaze deviation, but did have a positive gag reflex. CT scan of the head was obtained and demonstrated acute intracranial hemorrhage with intraventricular extension. Cervical spine was without radiographic evidence of injury. She was intubated for airway protection with anticipation of transfer to [facility]. PCCM accepting.  MICROBIOLOGY: MRSA PCR 6/15 >> negative Blood Ctx x2 6/15 >> negative  Blood 6/20 > negative  Sputum 6/20 > negative Urine 6/16- Citrobacter Urine 6/20 > negative C diff 7/2 >> negative  CSF HSV 7/9 >>  CSF cx 7/9 >>   ANTIBIOTICS: 6/15 Rocephin >>6/18 6/16 Acef (prophylactic abx) >> 6/18 Zosyn 6/20 > 7/2 (ONGOING FEVERS AND ? DRUG FEVER) Vanc 6/20 > 6/22  STUDIES:    LINES/TUBES: OETT 7.5 6/15 >> 6/24; replaced 6/24>> 7/7 Foley 6/15 >> OGT 6/15 >> 6/24 PIV X3 IVC Drain 6/16 >>  Trach [employer]) 7/7 >>   SIGNIFICANT EVENTS: 6/15 - Admit to ICU on ventilator for ICH after transfer from [facility] ED 6/15 CT Head and neck: Acute intracranial hemorrhage with intraventricular extension and  possible developing hydrocephalus Although no fractures identified in the cervical spine, the evaluation of the cervical spine is limited. 6/19 CT head > no significant change since compared to prior scan. 6/20 CT Head   >>Degenerating RIGHT thalamus hematoma with local mass effect and edema. Similar mild hydrocephalus, stable RIGHT frontal ventriculostomy catheter. Degenerating intraventricular blood products.   6/25 Extubated successfully - re intubated   6/28: no sig change overnight.  CT stable.  Unable to wean cleviprex for HTN.   6/29: ongoing fevers withotu change in WBC. Moving right side. Doing SBt. Left side only flicker to pain occ per RN. Still on cleviprex - rn says new bp goal per NSGY is sbp < 140. Doing PSVT  10/15/15- per neurosurgery patient was brought per neurosurgery patient withdraws to stimulation intermittently and follows commands. X-ray ventricular drain appears to be functioning well. The plan is to possibly remove the drain on 10/17/2015. Ongoing fvers but biomarkers normal  10/17/15 - Patient following some commands this AM but clearly encephalopathic.  SUBJECTIVE/OVERNIGHT/INTERVAL HX No events overnight, tolerating ATC  VITAL SIGNS: BP 136/59 mmHg  Pulse 83  Temp(Src) 98.6 F (37 C) (Axillary)  Resp 7  Ht 5\' 3"  (1.6 m)  Wt 47 kg (103 lb 9.9 oz)  BMI 18.36 kg/m2  SpO2 100%  HEMODYNAMICS:    VENTILATOR SETTINGS: Vent Mode:  [-]  FiO2 (%):  [40 %] 40 %  INTAKE / OUTPUT: I/O last 3 completed shifts: In: 2685 [NG/GT:2370; IV Piggyback:315] Out: 2787 [Urine:2505; Drains:282]  PHYSICAL EXAMINATION: General:  Ill appearing female, NAD with IVC drain, does not  withdraws to pain. Integument:  Warm & dry. No rash on exposed skin.  HEENT:  Drain in place. No scleral injection or icterus. no significant oral secretions. Pupils 46mm, NR. Cardiovascular:  RRR, no M/R/G and SEM at LUSB. Pulmonary:  Resps even non labored on PS 5/5, Coarse bilaterally.    Abdomen: Soft. Normal bowel sounds. Nondistended. TF at goal Musculoskeletal:  Normal bulk. No joint deformity or effusion appreciated. Neurological: NO sedation. RASS-3 equivalent, unresponsive and not withdrawing to pain.  LABS: PULMONARY  Recent Labs Lab 10/18/15 0231 10/21/15 0342  PHART 7.441 7.460*  PCO2ART 34.0* 35.3  PO2ART 123.0* 147.0*  HCO3 23.1 24.9*  TCO2 24 26  O2SAT 99.0 99.0   CBC  Recent Labs Lab 10/22/15 0231 10/23/15 0418 10/24/15 0344  HGB 8.0* 8.5* 8.7*  HCT 24.9* 25.5* 27.4*  WBC 9.0 8.3 6.8  PLT 268 239 259   COAGULATION  Recent Labs Lab 10/20/15 0833  INR 1.07   CARDIAC  No results for input(s): TROPONINI in the last 168 hours. No results for input(s): PROBNP in the last 168 hours.  CHEMISTRY  Recent Labs Lab 10/20/15 0440 10/21/15 0338 10/22/15 0231 10/23/15 0418 10/24/15 0344  NA 140 136 137 134* 139  K 3.8 3.5 4.3 4.1 4.0  CL 107 105 106 104 106  CO2 24 23 23 23 26   GLUCOSE 121* 113* 140* 122* 140*  BUN 16 17 22* 16 13  CREATININE 0.50 0.53 0.57 0.47 0.46  CALCIUM 9.6 9.1 9.6 9.7 10.1  MG 2.2 2.2 2.2 2.0 2.1  PHOS 3.8 3.9 3.7 4.3 4.3   Estimated Creatinine Clearance: 55.5 mL/min (by C-G formula based on Cr of 0.46).  LIVER  Recent Labs Lab 10/20/15 0833  INR 1.07   INFECTIOUS No results for input(s): LATICACIDVEN, PROCALCITON in the last 168 hours. ENDOCRINE CBG (last 3)   Recent Labs  10/23/15 2352 10/24/15 0321 10/24/15 0758  GLUCAP 125* 129* 130*   IMAGING x48h No results found.   DISCUSSION: 60 year old female past medical history of coronary artery disease, ischemic cardiomyopathy, and polysubstance abuse being admitted with right thalamic intracranial hemorrhage.  Intubated for airway protection. Has failed extubation x 1, now with trach in place. Working on [employer]. ventric drain remains in place.    ASSESSMENT / PLAN:  PULMONARY A: Acute Hypoxemic resp failure 2/2 Unable to Protect Airway -  Secondary to [place]. Suspected HCAP, treated   P:   Continue ATC ad lib Wean O2 for sats > 90%. F/U CXR as needed.  CARDIOVASCULAR A:  Chronic Systolic CHF - EF 123456 w/ grade 1 diastolic dysfunction. RV normal. Hypertension - s/p cleviprex ending 10/16/15 H/O CAD - S/P PCI in 2015 H/O Hyperlipidemia  P:  BP control with SBP goal < 160 mmHg. Amlodipine 10 mg daily. Carvedilol 12.5 mg BID. PRN hydralazine.  RENAL A:   Metabolic Acidosis - resolved Hypernatremia. Resolved  P:   Monitor renal function and UOP. Strict I&O. Correct electrolytes as indicated. Restart free water 7/10 Maintain fluid even at this point.  GASTROINTESTINAL A:   H/O GERD On TF and ppi Diarrhea   P:   Continue TF. Changed ppi to h2 blockade, continue.  HEMATOLOGIC A:   No acute issues. P:  Follow intermittent CBC. SCDs. No chemical DVT ppx given ICH.  INFECTIOUS A:   H/O HSV Infection. Fever normalized.  P:   Monitor off abx. C.diff negative. Follow CSF for HSV, sent 7/9 Will not add acyclovir as patient is no  longer febrile and WBC has normalized, NSGY does not feel that HSV is very likely.  ENDOCRINE CBG (last 3)   Recent Labs  10/23/15 2352 10/24/15 0321 10/24/15 0758  GLUCAP 125* 129* 130*   A:   No acute issues P:   CBG's Q 4 hours SSI Follow glucose on BMP  NEUROLOGIC A:   Right Thalamic Intracranial Hemorrhage s/p IVC 6/16 Cocaine Abuse  H/O EtOH Abuse H/O Depression  P:  Sedation off  RASS goal: 0 but not achivevable prognostically Neurosurgery assistance appreciated, drain management as per their recs. ? When we may be able to d/c.  Blood pressure control as above. Poor prognosis   FAMILY  - Updates: No family bedside.  Proceed with weaning, given neuro status will need a PEG next week.   Independent CC time 35 minutes  Baltazar Apo, MD, PhD 10/24/2015, 9:47 AM [facility] [phone] or if no answer [phone]

## 2015-10-24 NOTE — Progress Notes (Signed)
Nutrition Follow-up  INTERVENTION:   D/C Vital AF 1.2 Osmolite 1.2 @ 50 ml/hr 30 ml Prostat daily Provides: 1540 kcal, 81 grams protein, and 984 ml H2O. Total free water: 1284 ml   NUTRITION DIAGNOSIS:   Inadequate oral intake related to inability to eat as evidenced by NPO status. Ongoing.   GOAL:   Patient will meet greater than or equal to 90% of their needs Met  MONITOR:   TF tolerance, Vent status, Labs  ASSESSMENT:   Pt with past medical history of coronary artery disease, ischemic cardiomyopathy, and polysubstance abuse being admitted with right thalamic intracranial hemorrhage, right frontal external ventricular drain placed 6/16.   7/7 trach, on trach collar since 7/9 and tolerating well per RN 6/24 Cortrak, coiled in stomach with Vital AF 1.2 @ 45 ml/hr = 1296 kcal, 81 grams protein  Pt discussed during ICU rounds and with RN.  Plan for PEG later this week and consideration for shunt if unable to remove IVD.   Medications reviewed and include: MVI Labs reviewed CBG's: 118-130 Free water: 100 ml every 8 hours = 300 ml  Diet Order:  Diet NPO time specified  Skin:  Reviewed, no issues  Last BM:  7/6 loose  Height:   Ht Readings from Last 1 Encounters:  09/29/15 '5\' 3"'$  (1.6 m)    Weight:   Wt Readings from Last 1 Encounters:  10/24/15 103 lb 9.9 oz (47 kg)    Ideal Body Weight:  52.2 kg  BMI:  Body mass index is 18.36 kg/(m^2).  Estimated Nutritional Needs:   Kcal:  4932-4199  Protein:  70-85 grams  Fluid:  > 1.5 L/day  EDUCATION NEEDS:   No education needs identified at this time  [place], [place], [place] Pager [phone] After Hours Pager

## 2015-10-25 LAB — CBC
HCT: 27.9 % — ABNORMAL LOW (ref 36.0–46.0)
HEMOGLOBIN: 9.2 g/dL — AB (ref 12.0–15.0)
MCH: 32.2 pg (ref 26.0–34.0)
MCHC: 33 g/dL (ref 30.0–36.0)
MCV: 97.6 fL (ref 78.0–100.0)
PLATELETS: 282 10*3/uL (ref 150–400)
RBC: 2.86 MIL/uL — AB (ref 3.87–5.11)
RDW: 13.3 % (ref 11.5–15.5)
WBC: 7.6 10*3/uL (ref 4.0–10.5)

## 2015-10-25 LAB — BASIC METABOLIC PANEL
ANION GAP: 8 (ref 5–15)
BUN: 15 mg/dL (ref 6–20)
CALCIUM: 10.2 mg/dL (ref 8.9–10.3)
CO2: 27 mmol/L (ref 22–32)
CREATININE: 0.49 mg/dL (ref 0.44–1.00)
Chloride: 104 mmol/L (ref 101–111)
Glucose, Bld: 146 mg/dL — ABNORMAL HIGH (ref 65–99)
Potassium: 4.1 mmol/L (ref 3.5–5.1)
SODIUM: 139 mmol/L (ref 135–145)

## 2015-10-25 LAB — GLUCOSE, CAPILLARY
GLUCOSE-CAPILLARY: 134 mg/dL — AB (ref 65–99)
GLUCOSE-CAPILLARY: 140 mg/dL — AB (ref 65–99)
GLUCOSE-CAPILLARY: 132 mg/dL — AB (ref 65–99)
Glucose-Capillary: 145 mg/dL — ABNORMAL HIGH (ref 65–99)
Glucose-Capillary: 167 mg/dL — ABNORMAL HIGH (ref 65–99)

## 2015-10-25 MED ORDER — CHLORHEXIDINE GLUCONATE 0.12 % MT SOLN
15.0000 mL | Freq: Two times a day (BID) | OROMUCOSAL | Status: DC
  Administered 2015-10-25 – 2015-11-10 (×33): 15 mL via OROMUCOSAL
  Filled 2015-10-25 (×22): qty 15

## 2015-10-25 MED ORDER — CETYLPYRIDINIUM CHLORIDE 0.05 % MT LIQD
7.0000 mL | Freq: Two times a day (BID) | OROMUCOSAL | Status: DC
  Administered 2015-10-25 – 2015-11-09 (×31): 7 mL via OROMUCOSAL

## 2015-10-25 NOTE — Progress Notes (Signed)
No acute events Unchanged exam Drain patent Talked with son about possible shunt Friday.  He is considering if he wants to proceed with that.

## 2015-10-25 NOTE — Progress Notes (Signed)
PULMONARY / CRITICAL CARE MEDICINE   Name: Joanna Farmer MRN: [id number] DOB: 04-Jul-1955    ADMISSION DATE:  09/29/2015 CONSULTATION DATE:  09/29/2015  REFERRING MD:  Ezequiel Essex, M.D. / [facility]  CHIEF COMPLAINT:  Altered mental status, thalamic bleed.  BRIEF   60 year old female past medical history as below, which is significant for hypertension, alcohol abuse, cocaine abuse, HSV, coronary artery disease status post stents to RCA and LAD in 2015, and ischemic cardiomyopathy with LV EF 35-40%. She presented to [facility] emergency department 6/15 with altered mental status. Reportedly the night before she suffered a fall during the night, and since that time has been sleeping all day. She was brought to the emergency department with this complaint. In emergency department she was unresponsive but reportedly breathing adequately. She had no spontaneous movement and left gaze deviation, but did have a positive gag reflex. CT scan of the head was obtained and demonstrated acute intracranial hemorrhage with intraventricular extension. Cervical spine was without radiographic evidence of injury. She was intubated for airway protection with anticipation of transfer to [facility]. PCCM accepting.  MICROBIOLOGY: MRSA PCR 6/15 >> negative Blood Ctx x2 6/15 >> negative  Blood 6/20 > negative  Sputum 6/20 > negative Urine 6/16- Citrobacter Urine 6/20 > negative C diff 7/2 >> negative  CSF HSV 7/9 >>  CSF cx 7/9 >>   ANTIBIOTICS: 6/15 Rocephin >>6/18 6/16 Acef (prophylactic abx) >> 6/18 Zosyn 6/20 > 7/2 (ONGOING FEVERS AND ? DRUG FEVER) Vanc 6/20 > 6/22  STUDIES:    LINES/TUBES: OETT 7.5 6/15 >> 6/24; replaced 6/24>> 7/7 Foley 6/15 >> OGT 6/15 >> 6/24 PIV X3 IVC Drain 6/16 >>  Trach Hyman Bible) 7/7 >>   SIGNIFICANT EVENTS: 6/15 - Admit to ICU on ventilator for ICH after transfer from [facility] ED 6/15 CT Head and neck: Acute intracranial hemorrhage with intraventricular extension and  possible developing hydrocephalus Although no fractures identified in the cervical spine, the evaluation of the cervical spine is limited. 6/19 CT head > no significant change since compared to prior scan. 6/20 CT Head   >>Degenerating RIGHT thalamus hematoma with local mass effect and edema. Similar mild hydrocephalus, stable RIGHT frontal ventriculostomy catheter. Degenerating intraventricular blood products.   6/25 Extubated successfully - re intubated   6/28: no sig change overnight.  CT stable.  Unable to wean cleviprex for HTN.   6/29: ongoing fevers withotu change in WBC. Moving right side. Doing SBt. Left side only flicker to pain occ per RN. Still on cleviprex - rn says new bp goal per NSGY is sbp < 140. Doing PSVT  10/15/15- per neurosurgery patient was brought per neurosurgery patient withdraws to stimulation intermittently and follows commands. X-ray ventricular drain appears to be functioning well. The plan is to possibly remove the drain on 10/17/2015. Ongoing fvers but biomarkers normal  10/17/15 - Patient following some commands this AM but clearly encephalopathic.  SUBJECTIVE/OVERNIGHT/INTERVAL HX No events overnight, tolerating ATC Unresponsive on my exam  VITAL SIGNS: BP 139/65 mmHg  Pulse 83  Temp(Src) 98 F (36.7 C) (Axillary)  Resp 27  Ht 5\' 3"  (1.6 m)  Wt 46.2 kg (101 lb 13.6 oz)  BMI 18.05 kg/m2  SpO2 100%  HEMODYNAMICS:    VENTILATOR SETTINGS: Vent Mode:  [-]  FiO2 (%):  [28 %] 28 %  INTAKE / OUTPUT: I/O last 3 completed shifts: In: 2305 [I.V.:240; NG/GT:1855; IV [id number] Out: 2015 [Urine:1720; Drains:295]  PHYSICAL EXAMINATION: General:  Ill appearing female, NAD  with IVC drain, does not withdraw to pain. Integument:  Warm & dry. No rash on exposed skin.  HEENT:  Drain in place. No scleral injection or icterus. no significant oral secretions. Pupils 59mm, NR. Cardiovascular:  RRR, no M/R/G and SEM at LUSB. Pulmonary:  Resps even non labored  on PS 5/5, Coarse bilaterally.   Abdomen: Soft. Normal bowel sounds. Nondistended. TF at goal Musculoskeletal:  Normal bulk. No joint deformity or effusion appreciated. Neurological: NO sedation running. RASS-3 equivalent, unresponsive and not withdrawing to pain.  LABS: PULMONARY  Recent Labs Lab 10/21/15 0342  PHART 7.460*  PCO2ART 35.3  PO2ART 147.0*  HCO3 24.9*  TCO2 26  O2SAT 99.0   CBC  Recent Labs Lab 10/23/15 0418 10/24/15 0344 10/25/15 0248  HGB 8.5* 8.7* 9.2*  HCT 25.5* 27.4* 27.9*  WBC 8.3 6.8 7.6  PLT 239 259 282   COAGULATION  Recent Labs Lab 10/20/15 0833  INR 1.07   CARDIAC  No results for input(s): TROPONINI in the last 168 hours. No results for input(s): PROBNP in the last 168 hours.  CHEMISTRY  Recent Labs Lab 10/20/15 0440 10/21/15 0338 10/22/15 0231 10/23/15 0418 10/24/15 0344 10/25/15 0248  NA 140 136 137 134* 139 139  K 3.8 3.5 4.3 4.1 4.0 4.1  CL 107 105 106 104 106 104  CO2 24 23 23 23 26 27   GLUCOSE 121* 113* 140* 122* 140* 146*  BUN 16 17 22* 16 13 15   CREATININE 0.50 0.53 0.57 0.47 0.46 0.49  CALCIUM 9.6 9.1 9.6 9.7 10.1 10.2  MG 2.2 2.2 2.2 2.0 2.1  --   PHOS 3.8 3.9 3.7 4.3 4.3  --    Estimated Creatinine Clearance: 54.5 mL/min (by C-G formula based on Cr of 0.49).  LIVER  Recent Labs Lab 10/20/15 0833  INR 1.07   INFECTIOUS No results for input(s): LATICACIDVEN, PROCALCITON in the last 168 hours. ENDOCRINE CBG (last 3)   Recent Labs  10/24/15 2315 10/25/15 0354 10/25/15 0803  GLUCAP 140* 145* 167*   IMAGING x48h No results found.   DISCUSSION: 60 year old female past medical history of coronary artery disease, ischemic cardiomyopathy, and polysubstance abuse being admitted with right thalamic intracranial hemorrhage.  Intubated for airway protection. Has failed extubation x 1, now with trach in place. Working on [employer]. ventric drain remains in place.    ASSESSMENT / PLAN:  PULMONARY A: Acute  Hypoxemic resp failure 2/2 Unable to Protect Airway - Secondary to [place]. Suspected HCAP, treated   P:   Continue ATC ad lib Wean O2 for sats > 90%. F/U CXR as needed.  CARDIOVASCULAR A:  Chronic Systolic CHF - EF 123456 w/ grade 1 diastolic dysfunction. RV normal. Hypertension - s/p cleviprex ending 10/16/15 H/O CAD - S/P PCI in 2015 H/O Hyperlipidemia  P:  BP control with SBP goal < 160 mmHg. Amlodipine 10 mg daily. Carvedilol 12.5 mg BID. PRN hydralazine.  RENAL A:   Metabolic Acidosis - resolved Hypernatremia. Resolved  P:   Monitor renal function and UOP. Strict I&O. Correct electrolytes as indicated. Restarted free water 7/10 Maintain fluid even at this point.  GASTROINTESTINAL A:   H/O GERD On TF and ppi Diarrhea   P:   Continue TF. Changed ppi to h2 blockade, continue.  HEMATOLOGIC A:   No acute issues. P:  Follow intermittent CBC. SCDs. No chemical DVT ppx given ICH.  INFECTIOUS A:   H/O HSV Infection. Fever normalized.  P:   Monitor off abx. C.diff negative.  Follow CSF for HSV, sent 7/9 Will not add acyclovir as patient is no longer febrile and WBC has normalized, NSGY does not feel that HSV is very likely.  ENDOCRINE CBG (last 3)   Recent Labs  10/24/15 2315 10/25/15 0354 10/25/15 0803  GLUCAP 140* 145* 167*   A:   No acute issues P:   CBG's Q 4 hours SSI Follow glucose on BMP  NEUROLOGIC A:   Right Thalamic Intracranial Hemorrhage s/p IVC 6/16 Cocaine Abuse  H/O EtOH Abuse H/O Depression  P:  Sedation off, will d/c intermittent versed and fentanyl from MAR RASS goal: 0 but not achivevable prognostically, currently -3 Neurosurgery assistance appreciated, drain management as per their recs. Discussing possible shunt placement this week - family is considering this.  Blood pressure control as above. Poor prognosis   FAMILY  - Updates: I spoke with the patient's son Linna Hoff and daughter Lenna Sciara by phone today. Explained that  if we are going to pursue continued care then she would need PEG tube placement. They understand and agree with this. I also explained that once they decide whether to pursue the ventric shunt, we could commit to the PEG and probably do them at the same time. They are going to let us know, give consent for both procedures if they decide to pursue the shunt.    Independent CC time 35 minutes  Baltazar Apo, MD, PhD 10/25/2015, 1:04 PM [facility] [phone] or if no answer [phone]

## 2015-10-26 LAB — CSF CULTURE

## 2015-10-26 LAB — GLUCOSE, CAPILLARY
GLUCOSE-CAPILLARY: 117 mg/dL — AB (ref 65–99)
GLUCOSE-CAPILLARY: 132 mg/dL — AB (ref 65–99)
Glucose-Capillary: 113 mg/dL — ABNORMAL HIGH (ref 65–99)
Glucose-Capillary: 114 mg/dL — ABNORMAL HIGH (ref 65–99)
Glucose-Capillary: 136 mg/dL — ABNORMAL HIGH (ref 65–99)
Glucose-Capillary: 139 mg/dL — ABNORMAL HIGH (ref 65–99)
Glucose-Capillary: 150 mg/dL — ABNORMAL HIGH (ref 65–99)

## 2015-10-26 LAB — CSF CULTURE W GRAM STAIN: Culture: NO GROWTH

## 2015-10-26 LAB — HSV(HERPES SMPLX VRS)ABS-I+II(IGG)-CSF: HSV TYPE I/II AB, IGG CSF: 1.85 IV — AB (ref <=0.89)

## 2015-10-26 MED ORDER — BISACODYL 10 MG RE SUPP
10.0000 mg | Freq: Every day | RECTAL | Status: DC | PRN
  Administered 2015-10-27: 10 mg via RECTAL
  Filled 2015-10-26: qty 1

## 2015-10-26 MED ORDER — DOCUSATE SODIUM 50 MG/5ML PO LIQD
100.0000 mg | Freq: Two times a day (BID) | ORAL | Status: DC
  Administered 2015-10-26 – 2015-11-10 (×28): 100 mg via ORAL
  Filled 2015-10-26 (×29): qty 10

## 2015-10-26 MED ORDER — SENNOSIDES 8.8 MG/5ML PO SYRP
5.0000 mL | ORAL_SOLUTION | Freq: Every day | ORAL | Status: DC
  Administered 2015-10-26 – 2015-11-10 (×15): 5 mL via ORAL
  Filled 2015-10-26 (×15): qty 5

## 2015-10-26 NOTE — Plan of Care (Signed)
Problem: Respiratory: Goal: Ability to maintain a clear airway and adequate ventilation will improve Outcome: Progressing Tolerating tracheostomy collar at 28%

## 2015-10-26 NOTE — Progress Notes (Signed)
PULMONARY / CRITICAL CARE MEDICINE   Name: Joanna Farmer MRN: [id number] DOB: 1956-01-20    ADMISSION DATE:  09/29/2015 CONSULTATION DATE:  09/29/2015  REFERRING MD:  Ezequiel Essex, M.D. / [facility] ED[facility]  CHIEF COMPLAINT:  Altered mental status, thalamic bleed.  BRIEF   60 year old female past medical history as below, which is significant for hypertension, alcohol abuse, cocaine abuse, HSV, coronary artery disease status post stents to RCA and LAD in 2015, and ischemic cardiomyopathy with LV EF 35-40%. She presented to [facility] emergency department 6/15 with altered mental status. Reportedly the night before she suffered a fall during the night, and since that time has been sleeping all day. She was brought to the emergency department with this complaint. In emergency department she was unresponsive but reportedly breathing adequately. She had no spontaneous movement and left gaze deviation, but did have a positive gag reflex. CT scan of the head was obtained and demonstrated acute intracranial hemorrhage with intraventricular extension. Cervical spine was without radiographic evidence of injury. She was intubated for airway protection with anticipation of transfer to [facility]. PCCM accepting.  MICROBIOLOGY: MRSA PCR 6/15 >> negative Blood Ctx x2 6/15 >> negative  Blood 6/20 > negative  Sputum 6/20 > negative Urine 6/16- Citrobacter Urine 6/20 > negative C diff 7/2 >> negative  CSF HSV 7/9 >>  CSF cx 7/9 >>   ANTIBIOTICS: 6/15 Rocephin >>6/18 6/16 Acef (prophylactic abx) >> 6/18 Zosyn 6/20 > 7/2 (ONGOING FEVERS AND ? DRUG FEVER) Vanc 6/20 > 6/22  STUDIES:    LINES/TUBES: OETT 7.5 6/15 >> 6/24; replaced 6/24>> 7/7 Foley 6/15 >> OGT 6/15 >> 6/24 PIV X3 IVC Drain 6/16 >>  Trach [employer]) 7/7 >>   SIGNIFICANT EVENTS: 6/15 - Admit to ICU on ventilator for ICH after transfer from [facility] ED 6/15 CT Head and neck: Acute intracranial hemorrhage with intraventricular extension and  possible developing hydrocephalus Although no fractures identified in the cervical spine, the evaluation of the cervical spine is limited. 6/19 CT head > no significant change since compared to prior scan. 6/20 CT Head   >>Degenerating RIGHT thalamus hematoma with local mass effect and edema. Similar mild hydrocephalus, stable RIGHT frontal ventriculostomy catheter. Degenerating intraventricular blood products.   6/25 Extubated successfully - re intubated   6/28: no sig change overnight.  CT stable.  Unable to wean cleviprex for HTN.   6/29: ongoing fevers withotu change in WBC. Moving right side. Doing SBt. Left side only flicker to pain occ per RN. Still on cleviprex - rn says new bp goal per NSGY is sbp < 140. Doing PSVT  10/15/15- per neurosurgery patient was brought per neurosurgery patient withdraws to stimulation intermittently and follows commands. X-ray ventricular drain appears to be functioning well. The plan is to possibly remove the drain on 10/17/2015. Ongoing fvers but biomarkers normal  10/17/15 - Patient following some commands this AM but clearly encephalopathic.  SUBJECTIVE/OVERNIGHT/INTERVAL HX No events overnight, tolerating ATC Unresponsive on my exam  VITAL SIGNS: BP 126/61 mmHg  Pulse 72  Temp(Src) 98.3 F (36.8 C) (Oral)  Resp 15  Ht 5\' 3"  (1.6 m)  Wt 46.4 kg (102 lb 4.7 oz)  BMI 18.13 kg/m2  SpO2 98%  HEMODYNAMICS:    VENTILATOR SETTINGS: Vent Mode:  [-]  FiO2 (%):  [28 %] 28 %  INTAKE / OUTPUT: I/O last 3 completed shifts: In: 2280 [I.V.:265; NG/GT:1910; IV Piggyback:105] Out: 2198.5 [id number]; Drains:303.5]  PHYSICAL EXAMINATION: General:  Ill appearing female, NAD  with IVC drain, does not withdraw to pain. Integument:  Warm & dry. No rash on exposed skin.  HEENT:  Drain in place. No scleral injection or icterus. no significant oral secretions. Pupils 51mm, NR. Cardiovascular:  RRR, no M/R/G and SEM at LUSB. Pulmonary:  Resps even non labored  on PS 5/5, Coarse bilaterally.   Abdomen: Soft. Normal bowel sounds. Nondistended. TF at goal Musculoskeletal:  Normal bulk. No joint deformity or effusion appreciated. Neurological: NO sedation running. RASS-3 equivalent, unresponsive and not withdrawing to pain.  LABS: PULMONARY  Recent Labs Lab 10/21/15 0342  PHART 7.460*  PCO2ART 35.3  PO2ART 147.0*  HCO3 24.9*  TCO2 26  O2SAT 99.0   CBC  Recent Labs Lab 10/23/15 0418 10/24/15 0344 10/25/15 0248  HGB 8.5* 8.7* 9.2*  HCT 25.5* 27.4* 27.9*  WBC 8.3 6.8 7.6  PLT 239 259 282   COAGULATION  Recent Labs Lab 10/20/15 0833  INR 1.07   CARDIAC  No results for input(s): TROPONINI in the last 168 hours. No results for input(s): PROBNP in the last 168 hours.  CHEMISTRY  Recent Labs Lab 10/20/15 0440 10/21/15 0338 10/22/15 0231 10/23/15 0418 10/24/15 0344 10/25/15 0248  NA 140 136 137 134* 139 139  K 3.8 3.5 4.3 4.1 4.0 4.1  CL 107 105 106 104 106 104  CO2 24 23 23 23 26 27   GLUCOSE 121* 113* 140* 122* 140* 146*  BUN 16 17 22* 16 13 15   CREATININE 0.50 0.53 0.57 0.47 0.46 0.49  CALCIUM 9.6 9.1 9.6 9.7 10.1 10.2  MG 2.2 2.2 2.2 2.0 2.1  --   PHOS 3.8 3.9 3.7 4.3 4.3  --    Estimated Creatinine Clearance: 54.8 mL/min (by C-G formula based on Cr of 0.49).  LIVER  Recent Labs Lab 10/20/15 0833  INR 1.07   INFECTIOUS No results for input(s): LATICACIDVEN, PROCALCITON in the last 168 hours. ENDOCRINE CBG (last 3)   Recent Labs  10/25/15 2354 10/26/15 0337 10/26/15 0745  GLUCAP 117* 113* 114*   IMAGING x48h No results found.   DISCUSSION: 60 year old female past medical history of coronary artery disease, ischemic cardiomyopathy, and polysubstance abuse being admitted with right thalamic intracranial hemorrhage.  Intubated for airway protection. Has failed extubation x 1, now with trach in place. Working on [employer]. ventric drain remains in place.    ASSESSMENT / PLAN:  PULMONARY A: Acute  Hypoxemic resp failure 2/2 Unable to Protect Airway - Secondary to [place]. Suspected HCAP, treated   P:   Continue ATC ad lib Wean O2 for sats > 90%. F/U CXR as needed.  CARDIOVASCULAR A:  Chronic Systolic CHF - EF 123456 w/ grade 1 diastolic dysfunction. RV normal. Hypertension - s/p cleviprex ending 10/16/15 H/O CAD - S/P PCI in 2015 H/O Hyperlipidemia  P:  BP control with SBP goal < 160 mmHg. Amlodipine 10 mg daily. Carvedilol 12.5 mg BID. PRN hydralazine.  RENAL A:   Metabolic Acidosis - resolved Hypernatremia. Resolved  P:   Monitor renal function and UOP. Strict I&O. Correct electrolytes as indicated. Restarted free water 7/10 Maintain fluid even at this point.  GASTROINTESTINAL A:   H/O GERD On TF and ppi Diarrhea   P:   Continue TF. Changed ppi to h2 blockade, continue.  HEMATOLOGIC A:   No acute issues. P:  Follow intermittent CBC. SCDs. No chemical DVT ppx given ICH.  INFECTIOUS A:   H/O HSV Infection. Fever normalized.  P:   Monitor off abx. C.diff negative.  CSF HSV IgG is positive, no PCR or cx data seen Will defer possible addition of acyclovir to NSGY; unclear whether this reflects exposure or active infxn. Clinical hx not consistent w active infxn  ENDOCRINE CBG (last 3)   Recent Labs  10/25/15 2354 10/26/15 0337 10/26/15 0745  GLUCAP 117* 113* 114*   A:   No acute issues P:   CBG's Q 4 hours SSI Follow glucose on BMP  NEUROLOGIC A:   Right Thalamic Intracranial Hemorrhage s/p IVC 6/16 Cocaine Abuse  H/O EtOH Abuse H/O Depression  P:  Sedation off, will d/c intermittent versed and fentanyl from MAR RASS goal: 0 but not achivevable prognostically, currently -3 Neurosurgery assistance appreciated, drain management as per their recs. Discussing possible shunt placement this week - family is considering this.  Blood pressure control as above. Poor prognosis   FAMILY  - Updates: I spoke with the patient's son Linna Hoff and  daughter Lenna Sciara by phone 7/11. Explained that if we are going to pursue continued care then she would need PEG tube placement. They understand and agree with this. I also explained that once they decide whether to pursue the ventric shunt, we could commit to the PEG and probably do them at the same time. They are going to let us know, give consent for both procedures if they decide to pursue the shunt.    Independent CC time 31 minutes  Baltazar Apo, MD, PhD 10/26/2015, 10:27 AM [facility] [phone] or if no answer [phone]

## 2015-10-26 NOTE — Progress Notes (Signed)
No acute events Exam unchanged Drain patent Would recommend placement of PEG at least three days before VP shunt

## 2015-10-27 ENCOUNTER — Encounter (HOSPITAL_COMMUNITY): Payer: Self-pay | Admitting: General Surgery

## 2015-10-27 ENCOUNTER — Encounter (HOSPITAL_COMMUNITY)
Admission: EM | Disposition: A | Discharge status: Nursing Home (Includes ICF) | Payer: Self-pay | Source: Home / Self Care | Attending: Internal Medicine

## 2015-10-27 HISTORY — PX: ESOPHAGOGASTRODUODENOSCOPY: SHX5428

## 2015-10-27 HISTORY — PX: PEG PLACEMENT: SHX5437

## 2015-10-27 LAB — GLUCOSE, CAPILLARY
Glucose-Capillary: 128 mg/dL — ABNORMAL HIGH (ref 65–99)
Glucose-Capillary: 121 mg/dL — ABNORMAL HIGH (ref 65–99)
Glucose-Capillary: 112 mg/dL — ABNORMAL HIGH (ref 65–99)
Glucose-Capillary: 118 mg/dL — ABNORMAL HIGH (ref 65–99)
Glucose-Capillary: 190 mg/dL — ABNORMAL HIGH (ref 65–99)
Glucose-Capillary: 223 mg/dL — ABNORMAL HIGH (ref 65–99)

## 2015-10-27 LAB — HERPES SIMPLEX VIRUS(HSV) DNA BY PCR
HSV 1 DNA: NEGATIVE
HSV 2 DNA: NEGATIVE

## 2015-10-27 IMAGING — CR DG CHEST 1V PORT
1 series · 1 of 1 positions shown · non-contrast
Comparison: 11/25/2014

CLINICAL DATA: Central chest pain for 2-3 days

EXAM:
PORTABLE CHEST - 1 VIEW

[ap portable]
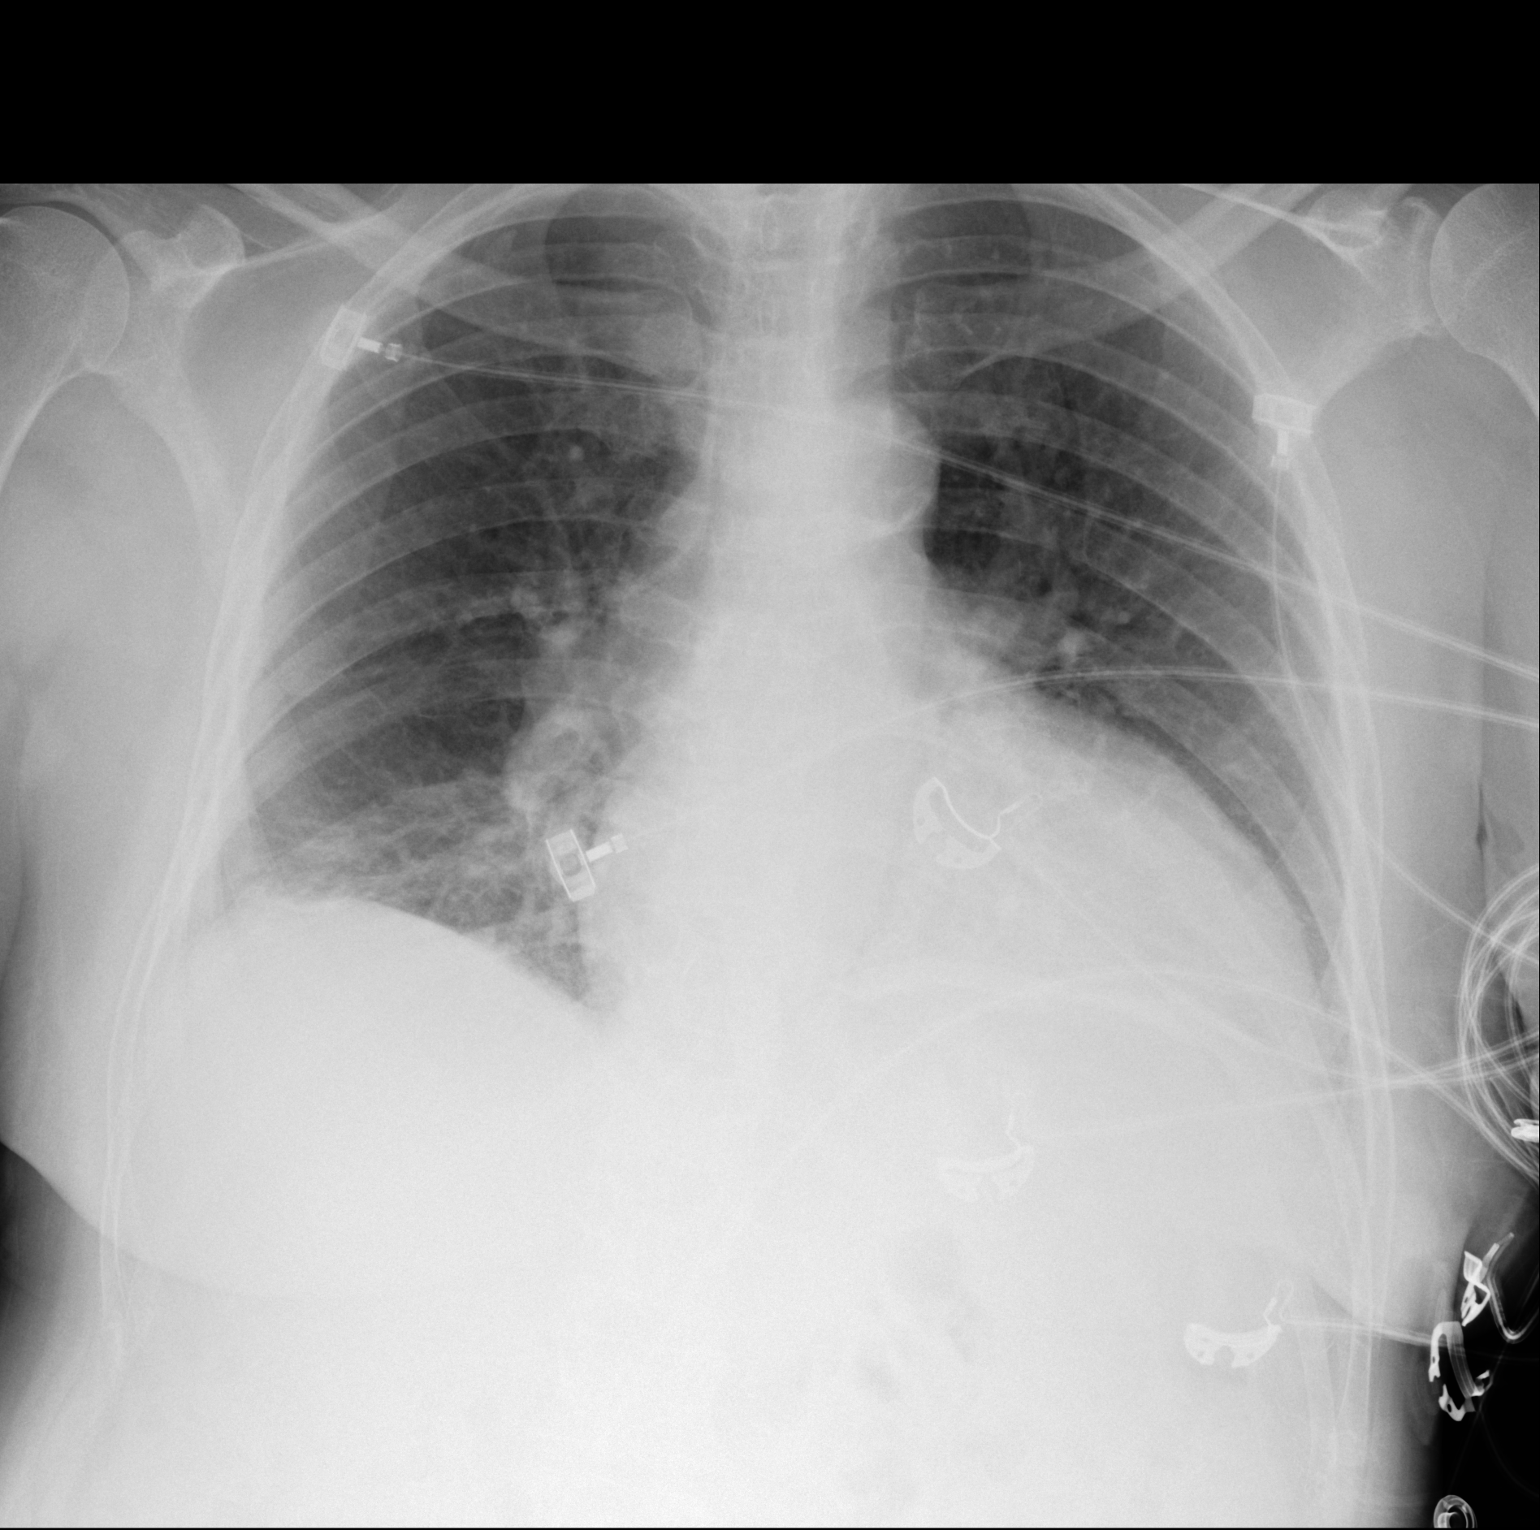

[1 of 1 positions shown; findings below may reference images not displayed]

FINDINGS: Cardiac shadow remains enlarged. Right basilar infiltrate is seen.
No sizable effusion is noted. No bony abnormality is noted.
IMPRESSION: Right basilar infiltrate.

## 2015-10-27 SURGERY — EGD (ESOPHAGOGASTRODUODENOSCOPY)
Anesthesia: Moderate Sedation

## 2015-10-27 MED ORDER — VECURONIUM BROMIDE 10 MG IV SOLR
INTRAVENOUS | Status: AC
  Administered 2015-10-27: 10 mg
  Filled 2015-10-27: qty 10

## 2015-10-27 MED ORDER — VECURONIUM BROMIDE 10 MG IV SOLR: 10.0000 mg | Freq: Once | INTRAVENOUS | Status: AC

## 2015-10-27 MED ORDER — FENTANYL CITRATE (PF) 100 MCG/2ML IJ SOLN
INTRAMUSCULAR | Status: AC
  Administered 2015-10-27: 100 ug via INTRAVENOUS
  Filled 2015-10-27: qty 4

## 2015-10-27 MED ORDER — MIDAZOLAM HCL 2 MG/2ML IJ SOLN
2.0000 mg | Freq: Once | INTRAMUSCULAR | Status: AC
  Administered 2015-10-27: 2 mg via INTRAVENOUS

## 2015-10-27 MED ORDER — FENTANYL CITRATE (PF) 100 MCG/2ML IJ SOLN
100.0000 ug | Freq: Once | INTRAMUSCULAR | Status: AC
  Administered 2015-10-27: 100 ug via INTRAVENOUS

## 2015-10-27 MED ORDER — MIDAZOLAM HCL 2 MG/2ML IJ SOLN
INTRAMUSCULAR | Status: AC
  Administered 2015-10-27: 2 mg via INTRAVENOUS
  Filled 2015-10-27: qty 4

## 2015-10-27 NOTE — Progress Notes (Signed)
Patient seen for trach team follow up.  All needed equipment at the bedside.  Patient currently on full ventilator support due to procedure this morning.  Will be placed back on trach collar once awake.  No education given at this time.  Will continue to monitor.

## 2015-10-27 NOTE — Progress Notes (Signed)
PULMONARY / CRITICAL CARE MEDICINE   Name: Joanna Farmer MRN: [id number] DOB: 07/11/1955    ADMISSION DATE:  09/29/2015 CONSULTATION DATE:  09/29/2015  REFERRING MD:  Ezequiel Essex, M.D. / [facility]  CHIEF COMPLAINT:  Altered mental status, thalamic bleed.  BRIEF   60 year old female past medical history as below, which is significant for hypertension, alcohol abuse, cocaine abuse, HSV, coronary artery disease status post stents to RCA and LAD in 2015, and ischemic cardiomyopathy with LV EF 35-40%. She presented to [facility] emergency department 6/15 with altered mental status. Reportedly the night before she suffered a fall during the night, and since that time has been sleeping all day. She was brought to the emergency department with this complaint. In emergency department she was unresponsive but reportedly breathing adequately. She had no spontaneous movement and left gaze deviation, but did have a positive gag reflex. CT scan of the head was obtained and demonstrated acute intracranial hemorrhage with intraventricular extension. Cervical spine was without radiographic evidence of injury. She was intubated for airway protection with anticipation of transfer to [facility]. PCCM accepting.  MICROBIOLOGY: MRSA PCR 6/15 >> negative Blood Ctx x2 6/15 >> negative  Blood 6/20 > negative  Sputum 6/20 > negative Urine 6/16- Citrobacter Urine 6/20 > negative C diff 7/2 >> negative  CSF HSV IgG 7/9 >> positive  CSF cx 7/9 >>   ANTIBIOTICS: 6/15 Rocephin >>6/18 6/16 Acef (prophylactic abx) >> 6/18 Zosyn 6/20 > 7/2 (ONGOING FEVERS AND ? DRUG FEVER) Vanc 6/20 > 6/22  STUDIES:    LINES/TUBES: OETT 7.5 6/15 >> 6/24; replaced 6/24>> 7/7 Foley 6/15 >> OGT 6/15 >> 6/24 IVC Drain 6/16 >>  Trach Hyman Bible) 7/7 >>   SIGNIFICANT EVENTS: 6/15 - Admit to ICU on ventilator for ICH after transfer from [facility] ED 6/15 CT Head and neck: Acute intracranial hemorrhage with intraventricular extension  and possible developing hydrocephalus Although no fractures identified in the cervical spine, the evaluation of the cervical spine is limited. 6/19 CT head > no significant change since compared to prior scan. 6/20 CT Head   >>Degenerating RIGHT thalamus hematoma with local mass effect and edema. Similar mild hydrocephalus, stable RIGHT frontal ventriculostomy catheter. Degenerating intraventricular blood products.   6/25 Extubated successfully - re intubated   6/28: no sig change overnight.  CT stable.  Unable to wean cleviprex for HTN.   6/29: ongoing fevers withotu change in WBC. Moving right side. Doing SBt. Left side only flicker to pain occ per RN. Still on cleviprex - rn says new bp goal per NSGY is sbp < 140. Doing PSVT  10/15/15- per neurosurgery patient was brought per neurosurgery patient withdraws to stimulation intermittently and follows commands. X-ray ventricular drain appears to be functioning well. The plan is to possibly remove the drain on 10/17/2015. Ongoing fvers but biomarkers normal  10/17/15 - Patient following some commands this AM but clearly encephalopathic.  SUBJECTIVE/OVERNIGHT/INTERVAL HX Neuro status unchanged For PEG today  VITAL SIGNS: BP 113/65 mmHg  Pulse 71  Temp(Src) 97.3 F (36.3 C) (Axillary)  Resp 16  Ht 5\' 3"  (1.6 m)  Wt 46.5 kg (102 lb 8.2 oz)  BMI 18.16 kg/m2  SpO2 100%  HEMODYNAMICS:    VENTILATOR SETTINGS: Vent Mode:  [-] PRVC FiO2 (%):  [28 %-50 %] 50 % Set Rate:  [16 bmp] 16 bmp Vt Set:  [420 mL] 420 mL PEEP:  [5 cmH20] 5 cmH20 Plateau Pressure:  [18 cmH20] 18 cmH20  INTAKE /  OUTPUT: I/O last 3 completed shifts: In: 1775 [I.V.:70; NG/GT:1600; IV Piggyback:105] Out: 2465 [Urine:2170; Drains:295]  PHYSICAL EXAMINATION: General:  Ill appearing female, NAD with IVC drain, does not withdraw to pain. Integument:  Warm & dry. No rash on exposed skin.  HEENT:  Drain in place. No scleral injection or icterus. no significant oral  secretions. Pupils 49mm, NR. Cardiovascular:  RRR, no M/R/G and SEM at LUSB. Pulmonary:  Resps even non labored on PS 5/5, Coarse bilaterally.   Abdomen: Soft. Normal bowel sounds. Nondistended. TF at goal Musculoskeletal:  Normal bulk. No joint deformity or effusion appreciated. Neurological: NO sedation running. RASS-3 equivalent, unresponsive and not withdrawing to pain.  LABS: PULMONARY  Recent Labs Lab 10/21/15 0342  PHART 7.460*  PCO2ART 35.3  PO2ART 147.0*  HCO3 24.9*  TCO2 26  O2SAT 99.0   CBC  Recent Labs Lab 10/23/15 0418 10/24/15 0344 10/25/15 0248  HGB 8.5* 8.7* 9.2*  HCT 25.5* 27.4* 27.9*  WBC 8.3 6.8 7.6  PLT 239 259 282   COAGULATION No results for input(s): INR in the last 168 hours. CARDIAC  No results for input(s): TROPONINI in the last 168 hours. No results for input(s): PROBNP in the last 168 hours.  CHEMISTRY  Recent Labs Lab 10/21/15 0338 10/22/15 0231 10/23/15 0418 10/24/15 0344 10/25/15 0248  NA 136 137 134* 139 139  K 3.5 4.3 4.1 4.0 4.1  CL 105 106 104 106 104  CO2 23 23 23 26 27   GLUCOSE 113* 140* 122* 140* 146*  BUN 17 22* 16 13 15   CREATININE 0.53 0.57 0.47 0.46 0.49  CALCIUM 9.1 9.6 9.7 10.1 10.2  MG 2.2 2.2 2.0 2.1  --   PHOS 3.9 3.7 4.3 4.3  --    Estimated Creatinine Clearance: 54.9 mL/min (by C-G formula based on Cr of 0.49).  LIVER No results for input(s): AST, ALT, ALKPHOS, BILITOT, PROT, ALBUMIN, INR in the last 168 hours. INFECTIOUS No results for input(s): LATICACIDVEN, PROCALCITON in the last 168 hours. ENDOCRINE CBG (last 3)   Recent Labs  10/26/15 2321 10/27/15 0316 10/27/15 0752  GLUCAP 132* 128* 121*   IMAGING x48h No results found.   DISCUSSION: 60 year old female past medical history of coronary artery disease, ischemic cardiomyopathy, and polysubstance abuse being admitted with right thalamic intracranial hemorrhage.  Intubated for airway protection. Has failed extubation x 1, now with trach in  place. Working on [employer]. ventric drain remains in place.    ASSESSMENT / PLAN:  PULMONARY A: Acute Hypoxemic resp failure 2/2 Unable to Protect Airway - Secondary to [place]. Suspected HCAP, treated   P:   Continue ATC ad lib Wean O2 for sats > 90%. F/U CXR as needed.  CARDIOVASCULAR A:  Chronic Systolic CHF - EF 123456 w/ grade 1 diastolic dysfunction. RV normal. Hypertension - s/p cleviprex ending 10/16/15 H/O CAD - S/P PCI in 2015 H/O Hyperlipidemia  P:  BP control with SBP goal < 160 mmHg. Amlodipine 10 mg daily. Carvedilol 12.5 mg BID. PRN hydralazine.  RENAL A:   Metabolic Acidosis - resolved Hypernatremia. Resolved  P:   Monitor renal function and UOP. Strict I&O. Correct electrolytes as indicated. Restarted free water 7/10 Maintain fluid even at this point.  GASTROINTESTINAL A:   H/O GERD On TF and ppi Diarrhea   P:   Continue TF. Changed ppi to h2 blockade, continue. Planning for PEG placement 7/13  HEMATOLOGIC A:   No acute issues. P:  Follow intermittent CBC. SCDs. No chemical DVT  ppx given ICH.  INFECTIOUS A:   H/O HSV Infection. Fever normalized.  P:   Monitor off abx. C.diff negative. CSF HSV IgG is positive, no PCR or cx data seen Will defer possible addition of acyclovir to NSGY; unclear whether this reflects exposure or active infxn. Clinical hx not consistent w active infxn  ENDOCRINE CBG (last 3)   Recent Labs  10/26/15 2321 10/27/15 0316 10/27/15 0752  GLUCAP 132* 128* 121*   A:   No acute issues P:   CBG's Q 4 hours SSI Follow glucose on BMP  NEUROLOGIC A:   Right Thalamic Intracranial Hemorrhage s/p IVC 6/16 Cocaine Abuse  H/O EtOH Abuse H/O Depression  P:  Sedation d/c'd from med list RASS goal: 0 but not achivevable prognostically, currently -3 Neurosurgery assistance appreciated, drain management as per their recs. Plan is for shunt placement next Monday  Blood pressure control as above. Poor prognosis    FAMILY  - Updates: I spoke with the patient's son Linna Hoff and daughter Lenna Sciara by phone 7/11.    Baltazar Apo, MD, PhD 10/27/2015, 11:21 AM [place] [facility] [phone] or if no answer [phone]

## 2015-10-27 NOTE — Op Note (Signed)
[facility] Patient Name: Joanna Farmer Procedure Date : 10/27/2015 MRN: [id number] Attending MD: Rikki Spearing, MD Date of Birth: Nov 16, 1955 CSN: [id number] Age: 60 Admit Type: Inpatient Procedure:                Upper GI endoscopy Indications:              Place PEG because patient is unable to eat, Place                            PEG due to neurological disorder causing impaired                            swallowing Providers:                Rikki Spearing, MD, Alfonso Patten, Technician Referring MD:              Medicines:                Fentanyl 100 micrograms IV, Midazolam 2 mg IV, Deep                            sedation was administered Complications:            No immediate complications. Estimated Blood Loss:     Estimated blood loss was minimal. Estimated blood                            loss was minimal. Procedure:                Pre-Anesthesia Assessment:                           - Prior to the procedure, a History and Physical                            was performed, and patient medications, allergies                            and sensitivities were reviewed. The patient's                            tolerance of previous anesthesia was reviewed.                           - The risks and benefits of the procedure and the                            sedation options and risks were discussed with the                            patient. All questions were answered and informed                            consent was obtained.                           -  The risks and benefits of the procedure and the                            sedation options and risks were discussed with the                            patient. All questions were answered and informed                            consent was obtained.                           - Pre-procedure physical examination revealed no                            contraindications to sedation.                           -  ASA Grade Assessment: III - A patient with severe                            systemic disease.                           After obtaining informed consent, the endoscope was                            passed under direct vision. Throughout the                            procedure, the patient's blood pressure, pulse, and                            oxygen saturations were monitored continuously. The                            [id number]) scope was introduced through the                            mouth, and advanced to the second part of duodenum.                            The upper GI endoscopy was accomplished without                            difficulty. The patient tolerated the procedure                            well. Scope In: Scope Out: Findings:      The upper third of the esophagus was normal.      The anterior wall of the gastric body was normal.      The first portion of the duodenum and second portion of the duodenum       were normal.      The upper third of  the esophagus was normal. Impression:               - Normal upper third of esophagus.                           - Normal anterior wall of the gastric body.                           - Normal first portion of the duodenum and second                            portion of the duodenum.                           - No specimens collected.                           - Normal second portion of the duodenum. [All                            Maneuvers]. Recommendation:           - Please follow the post-PEG recommendations                            including: start using PEG today. Procedure Code(s):        --- Professional ---                           [id number], Esophagogastroduodenoscopy, flexible,                            transoral; diagnostic, including collection of                            specimen(s) by brushing or washing, when performed                            (separate procedure) Diagnosis Code(s):         --- Professional ---                           R63.3, Feeding difficulties                           R13.10, Dysphagia, unspecified                           R29.818, Other symptoms and signs involving the                            nervous system                           Z43.1, Encounter for attention to gastrostomy CPT copyright 2016 [employer]. All rights reserved. The codes documented in this report are preliminary and upon coder review may  be revised to meet current compliance requirements. Judeth Horn III,  MD Judeth Horn III, MD 10/27/2015 1:52:34 PM This report has been signed electronically. Number of Addenda: 0

## 2015-10-27 NOTE — Progress Notes (Signed)
Nutrition Follow-up  INTERVENTION:   Continue enteral nutrition therapy: Osmolite 1.2 @ 50 ml/hr (128m) 30 ml Prostat daily Provides: 1540 kcal, 81 grams protein, and 984 ml H2O.  Total free water: 1284 ml   NUTRITION DIAGNOSIS:   Inadequate oral intake related to inability to eat as evidenced by NPO status. Ongoing.   GOAL:   Patient will meet greater than or equal to 90% of their needs Met.   MONITOR:   TF tolerance, I & O's, Labs  ASSESSMENT:   Pt with past medical history of coronary artery disease, ischemic cardiomyopathy, and polysubstance abuse being admitted with right thalamic intracranial hemorrhage, right frontal external ventricular drain placed 6/16.   7/7 trach, on trach collar since 7/9 and tolerating well per RN 7/13 PEG placed 10:30, TF resumed 1600 at 150 ml/hr  Pt discussed during ICU rounds and with RN.  Medications reviewed and include: colace, MVI, senokot Labs reviewed: CBG's: 112-132 Free water: 100 ml every 8 hours = 300 ml Weight appears stable  Diet Order:  Diet NPO time specified  Skin:  Reviewed, no issues  Last BM:  7/6  Height:   Ht Readings from Last 1 Encounters:  09/29/15 '5\' 3"'$  (1.6 m)    Weight:   Wt Readings from Last 1 Encounters:  10/27/15 102 lb 8.2 oz (46.5 kg)    Ideal Body Weight:  52.2 kg  BMI:  Body mass index is 18.16 kg/(m^2).  Estimated Nutritional Needs:   Kcal:  [address] Protein:  70-85 grams  Fluid:  > 1.5 L/day  EDUCATION NEEDS:   No education needs identified at this time  HMcSwain LFishers Land[place] CTabernashPager [phone] Hours Pager

## 2015-10-27 NOTE — Progress Notes (Signed)
No acute events ICPs controlled Exam unchanged Plan shunt for Monday

## 2015-10-28 LAB — GLUCOSE, CAPILLARY
GLUCOSE-CAPILLARY: 182 mg/dL — AB (ref 65–99)
GLUCOSE-CAPILLARY: 179 mg/dL — AB (ref 65–99)
GLUCOSE-CAPILLARY: 194 mg/dL — AB (ref 65–99)
GLUCOSE-CAPILLARY: 163 mg/dL — AB (ref 65–99)
Glucose-Capillary: 205 mg/dL — ABNORMAL HIGH (ref 65–99)
Glucose-Capillary: 205 mg/dL — ABNORMAL HIGH (ref 65–99)

## 2015-10-28 NOTE — Progress Notes (Signed)
Patient tolerating her tube feedings well.  No tenderness.  Shunt planned for next week.  I am available to assist on Monday or Tuesday morning.  Kathryne Eriksson. Dahlia Bailiff, MD, [place] [phone] Trauma Surgeon

## 2015-10-28 NOTE — Progress Notes (Signed)
No acute events Exam poor and unchanged Drain patent Continue current care Shunt next week

## 2015-10-28 NOTE — Progress Notes (Signed)
Inpatient Diabetes Program Recommendations  AACE/ADA: New Consensus Statement on Inpatient Glycemic Control (2015)  Target Ranges:  Prepandial:   less than 140 mg/dL      Peak postprandial:   less than 180 mg/dL (1-2 hours)      Critically ill patients:  140 - 180 mg/dL  Results for CHRYSTEL, DEGEUS (MRN [id number]) as of 10/28/2015 09:32  Ref. Range 10/27/2015 03:16 10/27/2015 07:52 10/27/2015 12:08 10/27/2015 16:08 10/27/2015 19:44 10/27/2015 23:28 10/28/2015 03:19 10/28/2015 08:33  Glucose-Capillary Latest Ref Range: 65-99 mg/dL 128 (H) 121 (H) 112 (H) 118 (H) 190 (H) 223 (H) 205 (H) 182 (H)    Review of Glycemic Control  Diabetes history: No Outpatient Diabetes medications: None Current orders for Inpatient glycemic control: CBGs Q4H  Inpatient Diabetes Program Recommendations: Correction (SSI): Glucose has ranged from 182-223 mg/dl over the past 8 hours. Please consider ordering Novolog sensitive correction scale Q4H.  Thanks, Barnie Alderman, RN, MSN, CDE Diabetes Coordinator Inpatient Diabetes Program [phone] (Team Pager from [place] to [place]) [phone] ([facility] office) [phone] [facility] office) [phone] [facility] office)

## 2015-10-28 NOTE — Progress Notes (Signed)
PULMONARY / CRITICAL CARE MEDICINE   Name: Joanna Farmer MRN: [id number] DOB: 1955-12-31    ADMISSION DATE:  09/29/2015 CONSULTATION DATE:  09/29/2015  REFERRING MD:  Ezequiel Essex, M.D. / [facility] EDP  CHIEF COMPLAINT:  Altered mental status, thalamic bleed.  BRIEF   60 year old female past medical history as below, which is significant for hypertension, alcohol abuse, cocaine abuse, HSV, coronary artery disease status post stents to RCA and LAD in 2015, and ischemic cardiomyopathy with LV EF 35-40%. She presented to [facility] emergency department 6/15 with altered mental status. Reportedly the night before she suffered a fall during the night, and since that time has been sleeping all day. She was brought to the emergency department with this complaint. In emergency department she was unresponsive but reportedly breathing adequately. She had no spontaneous movement and left gaze deviation, but did have a positive gag reflex. CT scan of the head was obtained and demonstrated acute intracranial hemorrhage with intraventricular extension. Cervical spine was without radiographic evidence of injury. She was intubated for airway protection with anticipation of transfer to [facility]. PCCM accepting.  MICROBIOLOGY: MRSA PCR 6/15 >> negative Blood Ctx x2 6/15 >> negative  Blood 6/20 > negative  Sputum 6/20 > negative Urine 6/16- Citrobacter Urine 6/20 > negative C diff 7/2 >> negative  CSF HSV IgG 7/9 >> positive  CSF cx 7/9 >>   ANTIBIOTICS: 6/15 Rocephin >>6/18 6/16 Acef (prophylactic abx) >> 6/18 Zosyn 6/20 > 7/2 (ONGOING FEVERS AND ? DRUG FEVER) Vanc 6/20 > 6/22  STUDIES:    LINES/TUBES: OETT 7.5 6/15 >> 6/24; replaced 6/24>> 7/7 Foley 6/15 >> OGT 6/15 >> 6/24 IVC Drain 6/16 >>  Trach Hyman Bible) 7/7 >>  Peg 7/12>>  SIGNIFICANT EVENTS: 6/15 - Admit to ICU on ventilator for ICH after transfer from [facility] ED 6/15 CT Head and neck: Acute intracranial hemorrhage with intraventricular  extension and possible developing hydrocephalus Although no fractures identified in the cervical spine, the evaluation of the cervical spine is limited. 6/19 CT head > no significant change since compared to prior scan. 6/20 CT Head   >>Degenerating RIGHT thalamus hematoma with local mass effect and edema. Similar mild hydrocephalus, stable RIGHT frontal ventriculostomy catheter. Degenerating intraventricular blood products.   6/25 Extubated successfully - re intubated   6/28: no sig change overnight.  CT stable.  Unable to wean cleviprex for HTN.   6/29: ongoing fevers withotu change in WBC. Moving right side. Doing SBt. Left side only flicker to pain occ per RN. Still on cleviprex - rn says new bp goal per NSGY is sbp < 140. Doing PSVT  10/15/15- per neurosurgery patient was brought per neurosurgery patient withdraws to stimulation intermittently and follows commands. X-ray ventricular drain appears to be functioning well. The plan is to possibly remove the drain on 10/17/2015. Ongoing fvers but biomarkers normal  10/17/15 - Patient following some commands this AM but clearly encephalopathic.  SUBJECTIVE/OVERNIGHT/INTERVAL HX Neuro status unchanged Peg in place  VITAL SIGNS: BP 143/62 mmHg  Pulse 110  Temp(Src) 98.5 F (36.9 C) (Axillary)  Resp 30  Ht 5\' 3"  (1.6 m)  Wt 102 lb 15.3 oz (46.7 kg)  BMI 18.24 kg/m2  SpO2 95%  HEMODYNAMICS:    VENTILATOR SETTINGS: Vent Mode:  [-]  FiO2 (%):  [28 %-50 %] 28 %  INTAKE / OUTPUT: I/O last 3 completed shifts: In: 2350 [Other:90; NG/GT:2050; IV Piggyback:210] Out: 2329 [Urine:1930; Drains:399]  PHYSICAL EXAMINATION: General:  Ill appearing female, NAD  with IVC drain, does not withdraw to pain. Integument:  Warm & dry. No rash on exposed skin.  HEENT:  Drain in place. No scleral injection or icterus. no significant oral secretions. Pupils 62mm, NR. Cardiovascular:  RRR, no M/R/G and SEM at LUSB. Pulmonary:  Resps even non labored on  PS 5/5, Coarse bilaterally.   Abdomen: Soft. Normal bowel sounds. Nondistended. TF at goal via PEG Musculoskeletal:  Normal bulk. No joint deformity or effusion appreciated. Neurological: NO sedation running. RASS-3 equivalent, unresponsive and not withdrawing to pain.  LABS: PULMONARY No results for input(s): PHART, PCO2ART, PO2ART, HCO3, TCO2, O2SAT in the last 168 hours.  Invalid input(s): PCO2, PO2 CBC  Recent Labs Lab 10/23/15 0418 10/24/15 0344 10/25/15 0248  HGB 8.5* 8.7* 9.2*  HCT 25.5* 27.4* 27.9*  WBC 8.3 6.8 7.6  PLT 239 259 282   COAGULATION No results for input(s): INR in the last 168 hours. CARDIAC  No results for input(s): TROPONINI in the last 168 hours. No results for input(s): PROBNP in the last 168 hours.  CHEMISTRY  Recent Labs Lab 10/22/15 0231 10/23/15 0418 10/24/15 0344 10/25/15 0248  NA 137 134* 139 139  K 4.3 4.1 4.0 4.1  CL 106 104 106 104  CO2 23 23 26 27   GLUCOSE 140* 122* 140* 146*  BUN 22* 16 13 15   CREATININE 0.57 0.47 0.46 0.49  CALCIUM 9.6 9.7 10.1 10.2  MG 2.2 2.0 2.1  --   PHOS 3.7 4.3 4.3  --    Estimated Creatinine Clearance: 55.1 mL/min (by C-G formula based on Cr of 0.49).  LIVER No results for input(s): AST, ALT, ALKPHOS, BILITOT, PROT, ALBUMIN, INR in the last 168 hours. INFECTIOUS No results for input(s): LATICACIDVEN, PROCALCITON in the last 168 hours. ENDOCRINE CBG (last 3)   Recent Labs  10/27/15 2328 10/28/15 0319 10/28/15 0833  GLUCAP 223* 205* 182*   IMAGING x48h No results found.   DISCUSSION: 60 year old female past medical history of coronary artery disease, ischemic cardiomyopathy, and polysubstance abuse being admitted with right thalamic intracranial hemorrhage.  Intubated for airway protection. Has failed extubation x 1, now with trach in place. 24x7 ATC. ventric drain remains in place.    ASSESSMENT / PLAN:  PULMONARY A: Acute Hypoxemic resp failure 2/2 Unable to Protect Airway - Secondary  to [place]. Suspected HCAP, treated   P:   Continue ATC ad lib Wean O2 for sats > 90%. F/U CXR as needed.  CARDIOVASCULAR A:  Chronic Systolic CHF - EF 123456 w/ grade 1 diastolic dysfunction. RV normal. Hypertension - s/p cleviprex ending 10/16/15 H/O CAD - S/P PCI in 2015 H/O Hyperlipidemia  P:  BP control with SBP goal < 160 mmHg. Amlodipine 10 mg daily. Carvedilol 12.5 mg BID. PRN hydralazine.  RENAL A:   Metabolic Acidosis - resolved Hypernatremia. Resolved  P:   Monitor renal function and UOP. Strict I&O. Correct electrolytes as indicated. Restarted free water 7/10 Maintain fluid even at this point.  GASTROINTESTINAL A:   H/O GERD On TF and ppi Diarrhea   P:   Continue TF. Changed ppi to h2 blockade, continue. PEG placed7/13  HEMATOLOGIC A:   No acute issues. P:  Follow intermittent CBC. SCDs. No chemical DVT ppx given ICH.  INFECTIOUS A:   H/O HSV Infection. Fever normalized.  P:   Monitor off abx. C.diff negative. CSF HSV IgG is positive, no PCR or cx data seen Deferred possible addition of acyclovir to NSGY; unclear whether this reflects exposure  or active infxn. Clinical hx not consistent w active infxn. Do not believe we need to treat  ENDOCRINE CBG (last 3)   Recent Labs  10/27/15 2328 10/28/15 0319 10/28/15 0833  GLUCAP 223* 205* 182*   A:   No acute issues P:   CBG's Q 4 hours SSI Follow glucose on BMP  NEUROLOGIC A:   Right Thalamic Intracranial Hemorrhage s/p IVC 6/16 Cocaine Abuse  H/O EtOH Abuse H/O Depression  P:  Sedation d/c'd from med list RASS goal: 0 but not achivevable prognostically, currently -3 Neurosurgery assistance appreciated, drain management as per their recs. Plan is for shunt placement next week Blood pressure control as above. Poor prognosis for any meaningful recovery, have discussed this with her family  FAMILY  - Updates: Dr Lamonte Sakai spoke with the patient's son Linna Hoff and daughter Lenna Sciara by phone  7/11. Suspect dispo to either [facility] or [facility] would be best given proximity to children   [name] Minor ACNP Maryanna Shape PCCM Pager [phone] till 3 pm If no answer page [phone] 10/28/2015, 10:23 AM  Attending Note:  I have examined patient, reviewed labs, studies and notes. I have discussed the case with S Minor, and I agree with the data and plans as amended above. No neurological changes noted. Drain still working. Goal convert to a shunt next week. PEG was placed - appreciate Dr Richarda Blade assistance. No new interventions today. Will check BMP 7/15 to assess Na and renal fxn.   Baltazar Apo, MD, PhD 10/28/2015, 11:46 AM [facility] [phone] or if no answer [phone]

## 2015-10-28 NOTE — Progress Notes (Signed)
Trach sutures were removed without any complications.

## 2015-10-29 LAB — CSF CELL COUNT WITH DIFFERENTIAL
RBC Count, CSF: 180 /mm3 — ABNORMAL HIGH
WBC CSF: 0 /mm3 (ref 0–5)

## 2015-10-29 LAB — BASIC METABOLIC PANEL
Anion gap: 9 (ref 5–15)
BUN: 22 mg/dL — AB (ref 6–20)
CALCIUM: 10 mg/dL (ref 8.9–10.3)
CO2: 27 mmol/L (ref 22–32)
CREATININE: 0.7 mg/dL (ref 0.44–1.00)
Chloride: 102 mmol/L (ref 101–111)
GFR calc Af Amer: >60 mL/min (ref >60)
Glucose, Bld: 181 mg/dL — ABNORMAL HIGH (ref 65–99)
POTASSIUM: 4.1 mmol/L (ref 3.5–5.1)
SODIUM: 138 mmol/L (ref 135–145)

## 2015-10-29 LAB — PROTEIN AND GLUCOSE, CSF
Glucose, CSF: 97 mg/dL — ABNORMAL HIGH (ref 40–70)
Total  Protein, CSF: 8 mg/dL — ABNORMAL LOW (ref 15–45)

## 2015-10-29 LAB — GLUCOSE, CAPILLARY
GLUCOSE-CAPILLARY: 192 mg/dL — AB (ref 65–99)
GLUCOSE-CAPILLARY: 156 mg/dL — AB (ref 65–99)
GLUCOSE-CAPILLARY: 216 mg/dL — AB (ref 65–99)
GLUCOSE-CAPILLARY: 151 mg/dL — AB (ref 65–99)
Glucose-Capillary: 157 mg/dL — ABNORMAL HIGH (ref 65–99)

## 2015-10-29 MED ORDER — VANCOMYCIN HCL IN DEXTROSE 1-5 GM/200ML-% IV SOLN
1000.0000 mg | INTRAVENOUS | Status: DC
  Administered 2015-10-29 – 2015-11-02 (×5): 1000 mg via INTRAVENOUS
  Filled 2015-10-29 (×5): qty 200

## 2015-10-29 MED ORDER — DEXTROSE 5 % IV SOLN
2.0000 g | INTRAVENOUS | Status: DC
  Administered 2015-10-29 – 2015-11-02 (×5): 2 g via INTRAVENOUS
  Filled 2015-10-29 (×5): qty 2

## 2015-10-29 NOTE — Progress Notes (Addendum)
PULMONARY / CRITICAL CARE MEDICINE   Name: Joanna Farmer MRN: [id number] DOB: June 10, 1955    ADMISSION DATE:  09/29/2015 CONSULTATION DATE:  09/29/2015  REFERRING MD:  Ezequiel Essex, M.D. / [facility] EDP  CHIEF COMPLAINT:  Altered mental status, thalamic bleed.  BRIEF   60 year old female past medical history as below, which is significant for hypertension, alcohol abuse, cocaine abuse, HSV, coronary artery disease status post stents to RCA and LAD in 2015, and ischemic cardiomyopathy with LV EF 35-40%. She presented to [facility] emergency department 6/15 with altered mental status. Reportedly the night before she suffered a fall during the night, and since that time has been sleeping all day. She was brought to the emergency department with this complaint. In emergency department she was unresponsive but reportedly breathing adequately. She had no spontaneous movement and left gaze deviation, but did have a positive gag reflex. CT scan of the head was obtained and demonstrated acute intracranial hemorrhage with intraventricular extension. Cervical spine was without radiographic evidence of injury. She was intubated for airway protection with anticipation of transfer to [facility]. PCCM accepting.  MICROBIOLOGY: MRSA PCR 6/15 >> negative Blood Ctx x2 6/15 >> negative  Blood 6/20 > negative  Sputum 6/20 > negative Urine 6/16- Citrobacter Urine 6/20 > negative C diff 7/2 >> negative  CSF HSV IgG 7/9 >> positive  CSF cx 7/9 >> NEG CSF 7/15>>>  ANTIBIOTICS: 6/15 Rocephin >>6/18 6/16 Acef (prophylactic abx) >> 6/18 Zosyn 6/20 > 7/2 (ONGOING FEVERS AND ? DRUG FEVER) Vanc 6/20 > 6/22  STUDIES:    LINES/TUBES: OETT 7.5 6/15 >> 6/24; replaced 6/24>> 7/7 Foley 6/15 >>out, 7/15>>> OGT 6/15 >> 6/24 IVC Drain 6/16 >>  Trach Hyman Bible) 7/7 >>  Peg 7/12>>  SIGNIFICANT EVENTS: 6/15 - Admit to ICU on ventilator for ICH after transfer from [facility] ED 6/15 CT Head and neck: Acute intracranial  hemorrhage with intraventricular extension and possible developing hydrocephalus Although no fractures identified in the cervical spine, the evaluation of the cervical spine is limited. 6/19 CT head > no significant change since compared to prior scan. 6/20 CT Head   >>Degenerating RIGHT thalamus hematoma with local mass effect and edema. Similar mild hydrocephalus, stable RIGHT frontal ventriculostomy catheter. Degenerating intraventricular blood products.   6/25 Extubated successfully - re intubated   6/28: no sig change overnight.  CT stable.  Unable to wean cleviprex for HTN.   6/29: ongoing fevers withotu change in WBC. Moving right side. Doing SBt. Left side only flicker to pain occ per RN. Still on cleviprex - rn says new bp goal per NSGY is sbp < 140. Doing PSVT  10/15/15- per neurosurgery patient was brought per neurosurgery patient withdraws to stimulation intermittently and follows commands. X-ray ventricular drain appears to be functioning well. The plan is to possibly remove the drain on 10/17/2015. Ongoing fvers but biomarkers normal  10/17/15 - Patient following some commands this AM but clearly encephalopathic.  SUBJECTIVE/OVERNIGHT/INTERVAL HX Neuro status unchanged CSF pulled from ventric by Dr. Cyndy Freeze - to lab for culture  For shunt this week.  Urinary retention - frequently requiring i/o cath   VITAL SIGNS: BP 122/58 mmHg  Pulse 89  Temp(Src) 99.9 F (37.7 C) (Axillary)  Resp 18  Ht 5\' 3"  (1.6 m)  Wt 47.6 kg (104 lb 15 oz)  BMI 18.59 kg/m2  SpO2 100%  HEMODYNAMICS:    VENTILATOR SETTINGS: Vent Mode:  [-]  FiO2 (%):  [28 %] 28 %  INTAKE /  OUTPUT: I/O last 3 completed shifts: In: 3015 [NG/GT:2700; IV Piggyback:315] Out: [id number] [Urine:1315; Drains:238]  PHYSICAL EXAMINATION: General:  Ill appearing female, NAD with IVC drain, does not withdraw to pain. Integument:  Warm & dry. No rash on exposed skin.  HEENT:  Drain in place. No scleral injection or  icterus. no significant oral secretions. Pupils 32mm, NR. Cardiovascular:  RRR, no M/R/G and SEM at LUSB. Pulmonary:  Resps even non labored on ATC, Coarse bilaterally.   Abdomen: Soft. Normal bowel sounds. Nondistended. TF at goal via PEG Musculoskeletal:  Normal bulk. No joint deformity or effusion appreciated. Neurological: NO sedation running. RASS-3 equivalent, unresponsive and not withdrawing to pain.  LABS: PULMONARY No results for input(s): PHART, PCO2ART, PO2ART, HCO3, TCO2, O2SAT in the last 168 hours.  Invalid input(s): PCO2, PO2 CBC  Recent Labs Lab 10/23/15 0418 10/24/15 0344 10/25/15 0248  HGB 8.5* 8.7* 9.2*  HCT 25.5* 27.4* 27.9*  WBC 8.3 6.8 7.6  PLT 239 259 282   COAGULATION No results for input(s): INR in the last 168 hours. CARDIAC  No results for input(s): TROPONINI in the last 168 hours. No results for input(s): PROBNP in the last 168 hours.  CHEMISTRY  Recent Labs Lab 10/23/15 0418 10/24/15 0344 10/25/15 0248 10/29/15 0314  NA 134* 139 139 138  K 4.1 4.0 4.1 4.1  CL 104 106 104 102  CO2 23 26 27 27   GLUCOSE 122* 140* 146* 181*  BUN 16 13 15  22*  CREATININE 0.47 0.46 0.49 0.70  CALCIUM 9.7 10.1 10.2 10.0  MG 2.0 2.1  --   --   PHOS 4.3 4.3  --   --    Estimated Creatinine Clearance: 56.2 mL/min (by C-G formula based on Cr of 0.7).  LIVER No results for input(s): AST, ALT, ALKPHOS, BILITOT, PROT, ALBUMIN, INR in the last 168 hours. INFECTIOUS No results for input(s): LATICACIDVEN, PROCALCITON in the last 168 hours. ENDOCRINE CBG (last 3)   Recent Labs  10/28/15 2319 10/29/15 0326 10/29/15 0734  GLUCAP 163* 192* 156*   IMAGING x48h No results found.   DISCUSSION: 60 year old female past medical history of coronary artery disease, ischemic cardiomyopathy, and polysubstance abuse being admitted with right thalamic intracranial hemorrhage.  Intubated for airway protection. Has failed extubation x 1, now with trach in place,  tolerating 24x7 ATC. ventric drain remains in place with plans for shunt 7/17.    ASSESSMENT / PLAN:  PULMONARY A: Acute Hypoxemic resp failure 2/2 Unable to Protect Airway - Secondary to [place]. Suspected HCAP, treated  P:   Continue ATC ad lib Wean O2 for sats > 90%. F/U CXR as needed.  CARDIOVASCULAR A:  Chronic Systolic CHF - EF 123456 w/ grade 1 diastolic dysfunction. RV normal. Hypertension - s/p cleviprex ending 10/16/15 H/O CAD - S/P PCI in 2015 H/O Hyperlipidemia P:  BP control with SBP goal < 160 mmHg. Amlodipine 10 mg daily. Carvedilol 12.5 mg BID. PRN hydralazine.  RENAL A:   Metabolic Acidosis - resolved Hypernatremia. Resolved  P:   Monitor renal function and UOP. Strict I&O. Correct electrolytes as indicated. Restarted free water 7/10 Maintain fluid even at this point.  GASTROINTESTINAL A:   H/O GERD On TF and ppi Diarrhea  P:   Continue TF. pepcid PEG placed7/13  HEMATOLOGIC A:   No acute issues. P:  Follow intermittent CBC. SCDs. No chemical DVT ppx given ICH.  INFECTIOUS A:   H/O HSV Infection. Fever normalized. P:   Monitor off abx. C.diff  negative. CSF HSV IgG is positive, no PCR or cx data seen Deferred possible addition of acyclovir to NSGY; unclear whether this reflects exposure or active infxn. Clinical hx not consistent w active infxn. Do not believe we need to treat  ENDOCRINE CBG (last 3)  A:   No acute issues P:   CBG's Q 4 hours SSI Follow glucose on BMP  NEUROLOGIC A:   Right Thalamic Intracranial Hemorrhage s/p IVC 6/16 Cocaine Abuse  H/O EtOH Abuse H/O Depression P:  Sedation d/c'd from med list RASS goal: 0 but not achivevable prognostically, currently -3 Neurosurgery assistance appreciated, drain management as per their recs. Plan is for shunt placement next week Blood pressure control as above. Poor prognosis for any meaningful recovery, have discussed this with her family  FAMILY  - Updates: Dr Lamonte Sakai  spoke with the patient's son Linna Hoff and daughter Lenna Sciara by phone 7/11. Suspect dispo to either [facility] or [facility] would be best given proximity to children No family available 7/15.     Nickolas Madrid, NP 10/29/2015  9:16 AM Pager: [phone] or [phone]  Attending note: Has some respiratory secretions.  Does not follow commands.  Trach site with clear secretions.  HR regular.  Abd soft.  Na 138, Creatinine 0.7.  Assessment/plan: Acute respiratory failure after ICH now s/p trach. - bronchial hygiene - trach collar as tolerated   Chronic combined CHF. Hx of CAD, HLD. - continue meds  Updated pt's family at bedside.  Chesley Mires, MD [facility] Pulmonary/Critical Care 10/29/2015, 11:45 AM Pager:  [phone] After 3pm call: [phone]

## 2015-10-29 NOTE — Progress Notes (Signed)
No acute events Febrile Exam unchanged Drain patent Sent CSF for routine studies Started antibiotics since drain has been in so long and will be in a few more days

## 2015-10-29 NOTE — Progress Notes (Signed)
Pharmacy Antibiotic Note  Joanna Farmer is a 60 y.o. female admitted on 09/29/2015 with ICH.  Pharmacy has been consulted for vancomycin dosing due to prolonged EVD. Tmax is 101.3. SCr is WNL.   Plan: - Vancomycin 1gm IV Q24H - F/u renal fxn, C&S, clinical status and trough at SS  Height: 5\' 3"  (160 cm) Weight: 104 lb 15 oz (47.6 kg) IBW/kg (Calculated) : 52.4  Temp (24hrs), Avg:100.1 F (37.8 C), Min:98.6 F (37 C), Max:101.3 F (38.5 C)   Recent Labs Lab 10/23/15 0418 10/24/15 0344 10/25/15 0248 10/29/15 0314  WBC 8.3 6.8 7.6  --   CREATININE 0.47 0.46 0.49 0.70    Estimated Creatinine Clearance: 56.2 mL/min (by C-G formula based on Cr of 0.7).    No Known Allergies  Antimicrobials this admission: Rocephin 6/15 >> 6/17;7/15>> Vanc 6/20 >>6/22; 7/15>> Zosyn 6/20 >> 7/  Dose adjustments this admission: N/A  Microbiology results: 6/16 Urine cx: 30k of citrobacter (R - cefazolin) 6/20 Bcx: NGF 6/20 CSF: NGF 6/20 TA: ngF 7/2 Cdiff neg  Thank you for allowing pharmacy to be a part of this patient's care.  Dain Laseter, Rande Lawman 10/29/2015 8:57 AM

## 2015-10-30 LAB — GLUCOSE, CAPILLARY
GLUCOSE-CAPILLARY: 177 mg/dL — AB (ref 65–99)
GLUCOSE-CAPILLARY: 187 mg/dL — AB (ref 65–99)
Glucose-Capillary: 163 mg/dL — ABNORMAL HIGH (ref 65–99)
Glucose-Capillary: 166 mg/dL — ABNORMAL HIGH (ref 65–99)
Glucose-Capillary: 173 mg/dL — ABNORMAL HIGH (ref 65–99)
Glucose-Capillary: 184 mg/dL — ABNORMAL HIGH (ref 65–99)
Glucose-Capillary: 209 mg/dL — ABNORMAL HIGH (ref 65–99)

## 2015-10-30 NOTE — Progress Notes (Signed)
No acute events Exam unchanged CSF looks good Monitor EVD, open if pressure > 25 cm H2O for 5-10 minutes

## 2015-10-30 NOTE — Progress Notes (Signed)
PULMONARY / CRITICAL CARE MEDICINE   Name: Joanna Farmer MRN: [id number] DOB: 1955/07/25    ADMISSION DATE:  09/29/2015 CONSULTATION DATE:  09/29/2015  REFERRING MD:  Ezequiel Essex, M.D. / [facility]  CHIEF COMPLAINT:  Altered mental status, thalamic bleed.  BRIEF   60 year old female past medical history as below, which is significant for hypertension, alcohol abuse, cocaine abuse, HSV, coronary artery disease status post stents to RCA and LAD in 2015, and ischemic cardiomyopathy with LV EF 35-40%. She presented to [facility] emergency department 6/15 with altered mental status. Reportedly the night before she suffered a fall during the night, and since that time has been sleeping all day. She was brought to the emergency department with this complaint. In emergency department she was unresponsive but reportedly breathing adequately. She had no spontaneous movement and left gaze deviation, but did have a positive gag reflex. CT scan of the head was obtained and demonstrated acute intracranial hemorrhage with intraventricular extension. Cervical spine was without radiographic evidence of injury. She was intubated for airway protection with anticipation of transfer to [facility]. PCCM accepting.  MICROBIOLOGY: MRSA PCR 6/15 >> negative Blood Ctx x2 6/15 >> negative  Blood 6/20 > negative  Sputum 6/20 > negative Urine 6/16- Citrobacter Urine 6/20 > negative C diff 7/2 >> negative  CSF HSV IgG 7/9 >> positive  CSF cx 7/9 >> NEG CSF 7/15>>>  ANTIBIOTICS: 6/15 Rocephin >>6/18, 7/16>>> 6/16 Acef (prophylactic abx) >> 6/18 Zosyn 6/20 > 7/2 (ONGOING FEVERS AND ? DRUG FEVER) Vanc 6/20 > 6/22, 7/16>>>  STUDIES:    LINES/TUBES: OETT 7.5 6/15 >> 6/24; replaced 6/24>> 7/7 Foley 6/15 >>out, 7/15>>> OGT 6/15 >> 6/24 IVC Drain 6/16 >>  Trach [employer]) 7/7 >>  Peg 7/12>>  SIGNIFICANT EVENTS: 6/15 - Admit to ICU on ventilator for ICH after transfer from [facility] 6/15 CT Head and neck: Acute  intracranial hemorrhage with intraventricular extension and possible developing hydrocephalus Although no fractures identified in the cervical spine, the evaluation of the cervical spine is limited. 6/19 CT head > no significant change since compared to prior scan. 6/20 CT Head   >>Degenerating RIGHT thalamus hematoma with local mass effect and edema. Similar mild hydrocephalus, stable RIGHT frontal ventriculostomy catheter. Degenerating intraventricular blood products.   6/25 Extubated successfully - re intubated   6/28: no sig change overnight.  CT stable.  Unable to wean cleviprex for HTN.   6/29: ongoing fevers withotu change in WBC. Moving right side. Doing SBt. Left side only flicker to pain occ per RN. Still on cleviprex - rn says new bp goal per NSGY is sbp < 140. Doing PSVT  10/15/15- per neurosurgery patient was brought per neurosurgery patient withdraws to stimulation intermittently and follows commands. X-ray ventricular drain appears to be functioning well. The plan is to possibly remove the drain on 10/17/2015. Ongoing fvers but biomarkers normal  10/17/15 - Patient following some commands this AM but clearly encephalopathic.  SUBJECTIVE/OVERNIGHT/INTERVAL HX Neuro status unchanged.  For shunt this week.   VITAL SIGNS: BP 122/58 mmHg  Pulse 79  Temp(Src) 98.8 F (37.1 C) (Axillary)  Resp 23  Ht 5\' 3"  (1.6 m)  Wt 48.5 kg (106 lb 14.8 oz)  BMI 18.95 kg/m2  SpO2 100%  HEMODYNAMICS:    VENTILATOR SETTINGS: Vent Mode:  [-]  FiO2 (%):  [28 %] 28 %  INTAKE / OUTPUT: I/O last 3 completed shifts: In: 2865 [NG/GT:2300; IV Piggyback:565] Out: [id number]; Drains:190]  PHYSICAL EXAMINATION:  General:  Ill appearing female, NAD with IVC drain, does not withdraw to pain. Integument:  Warm & dry. No rash on exposed skin.  HEENT:  Drain in place. Dressing c/d, No scleral injection or icterus. no significant oral secretions. Pupils 30mm, NR. Cardiovascular:  RRR, no M/R/G  and SEM at LUSB. Pulmonary:  Resps even non labored on ATC, Coarse bilaterally, clear secretions  Abdomen: Soft. Normal bowel sounds. Nondistended. TF at goal via PEG Musculoskeletal:  Normal bulk. No joint deformity or effusion appreciated. Neurological: NO sedation running. RASS-3 equivalent, unresponsive and not withdrawing to pain, coughs   LABS: PULMONARY No results for input(s): PHART, PCO2ART, PO2ART, HCO3, TCO2, O2SAT in the last 168 hours.  Invalid input(s): PCO2, PO2 CBC  Recent Labs Lab 10/24/15 0344 10/25/15 0248  HGB 8.7* 9.2*  HCT 27.4* 27.9*  WBC 6.8 7.6  PLT 259 282   COAGULATION No results for input(s): INR in the last 168 hours. CARDIAC  No results for input(s): TROPONINI in the last 168 hours. No results for input(s): PROBNP in the last 168 hours.  CHEMISTRY  Recent Labs Lab 10/24/15 0344 10/25/15 0248 10/29/15 0314  NA 139 139 138  K 4.0 4.1 4.1  CL 106 104 102  CO2 26 27 27   GLUCOSE 140* 146* 181*  BUN 13 15 22*  CREATININE 0.46 0.49 0.70  CALCIUM 10.1 10.2 10.0  MG 2.1  --   --   PHOS 4.3  --   --    Estimated Creatinine Clearance: 57.3 mL/min (by C-G formula based on Cr of 0.7).  LIVER No results for input(s): AST, ALT, ALKPHOS, BILITOT, PROT, ALBUMIN, INR in the last 168 hours. INFECTIOUS No results for input(s): LATICACIDVEN, PROCALCITON in the last 168 hours. ENDOCRINE CBG (last 3)   Recent Labs  10/29/15 2353 10/30/15 0320 10/30/15 0812  GLUCAP 173* 166* 163*   IMAGING x48h No results found.   DISCUSSION: 60 year old female past medical history of coronary artery disease, ischemic cardiomyopathy, and polysubstance abuse being admitted with right thalamic intracranial hemorrhage.  Intubated for airway protection. Has failed extubation x 1, now with trach in place, tolerating 24x7 ATC. ventric drain remains in place with plans for shunt 7/17.    ASSESSMENT / PLAN:  PULMONARY A: Acute Hypoxemic resp failure 2/2 Unable to  Protect Airway - Secondary to [place]. Suspected HCAP, treated  P:   Continue ATC ad lib Wean O2 for sats > 90%. F/U CXR as needed.  CARDIOVASCULAR A:  Chronic Systolic CHF - EF 123456 w/ grade 1 diastolic dysfunction. RV normal. Hypertension - s/p cleviprex ending 10/16/15 H/O CAD - S/P PCI in 2015 H/O Hyperlipidemia P:  BP control with SBP goal < 160 mmHg. Amlodipine 10 mg daily. Carvedilol 12.5 mg BID. PRN hydralazine.  RENAL A:   Metabolic Acidosis - resolved Hypernatremia. Resolved  P:   Monitor renal function and UOP. Strict I&O. Correct electrolytes as indicated. Restarted free water 7/10 Maintain fluid even at this point.  GASTROINTESTINAL A:   H/O GERD On TF and ppi Diarrhea  P:   Continue TF. pepcid PEG placed7/13  HEMATOLOGIC A:   No acute issues. P:  Follow intermittent CBC. SCDs. No chemical DVT ppx given ICH.  INFECTIOUS A:   H/O HSV Infection. Fever normalized. P:   Monitor off abx. C.diff negative. CSF HSV IgG is positive, no PCR or cx data seen Vanc, rocephin added by nsgy 7/15 r/t indwelling drain   ENDOCRINE CBG (last 3)  A:  No acute issues P:   CBG's Q 4 hours SSI Follow glucose on BMP  NEUROLOGIC A:   Right Thalamic Intracranial Hemorrhage s/p IVC 6/16 Cocaine Abuse  H/O EtOH Abuse H/O Depression P:  RASS goal: 0 but not achivevable prognostically, currently -3 Neurosurgery assistance appreciated, drain management as per their recs. Plan is for shunt placement this week Blood pressure control as above. Poor prognosis for any meaningful recovery, have discussed this with her family  FAMILY  - Updates: Dr Lamonte Sakai spoke with the patient's son Linna Hoff and daughter Lenna Sciara by phone 7/11. Suspect dispo to either [place] or [place] would be best given proximity to children No family available 7/16.     Nickolas Madrid, NP 10/30/2015  9:24 AM Pager: [phone] or [phone]   Attending note: Secretions  better.  Does not follow commands. Trach site with clear secretions. HR regular. Abd soft.  Assessment/plan: Acute respiratory failure after ICH now s/p trach. - bronchial hygiene - trach collar as tolerated   Chronic combined CHF. Hx of CAD, HLD. - continue meds  Chesley Mires, MD [facility] Pulmonary/Critical Care 10/30/2015, 2:33 PM Pager:  [phone] After 3pm call: [phone]

## 2015-10-31 DIAGNOSIS — I612 Nontraumatic intracerebral hemorrhage in hemisphere, unspecified: Secondary | ICD-10-CM

## 2015-10-31 LAB — GLUCOSE, CAPILLARY
GLUCOSE-CAPILLARY: 204 mg/dL — AB (ref 65–99)
GLUCOSE-CAPILLARY: 179 mg/dL — AB (ref 65–99)
GLUCOSE-CAPILLARY: 175 mg/dL — AB (ref 65–99)
GLUCOSE-CAPILLARY: 144 mg/dL — AB (ref 65–99)
Glucose-Capillary: 157 mg/dL — ABNORMAL HIGH (ref 65–99)

## 2015-10-31 LAB — BASIC METABOLIC PANEL
ANION GAP: 8 (ref 5–15)
BUN: 16 mg/dL (ref 6–20)
CALCIUM: 9.7 mg/dL (ref 8.9–10.3)
CO2: 25 mmol/L (ref 22–32)
Chloride: 101 mmol/L (ref 101–111)
Creatinine, Ser: 0.49 mg/dL (ref 0.44–1.00)
GFR calc non Af Amer: >60 mL/min (ref >60)
GLUCOSE: 190 mg/dL — AB (ref 65–99)
Potassium: 4.1 mmol/L (ref 3.5–5.1)
SODIUM: 134 mmol/L — AB (ref 135–145)

## 2015-10-31 LAB — CBC
HCT: 26 % — ABNORMAL LOW (ref 36.0–46.0)
Hemoglobin: 8.4 g/dL — ABNORMAL LOW (ref 12.0–15.0)
MCH: 30.9 pg (ref 26.0–34.0)
MCHC: 32.3 g/dL (ref 30.0–36.0)
MCV: 95.6 fL (ref 78.0–100.0)
PLATELETS: 277 10*3/uL (ref 150–400)
RBC: 2.72 MIL/uL — ABNORMAL LOW (ref 3.87–5.11)
RDW: 13.5 % (ref 11.5–15.5)
WBC: 7 10*3/uL (ref 4.0–10.5)

## 2015-10-31 MED ORDER — INSULIN ASPART 100 UNIT/ML ~~LOC~~ SOLN
0.0000 [IU] | SUBCUTANEOUS | Status: DC
  Administered 2015-10-31: 2 [IU] via SUBCUTANEOUS
  Administered 2015-10-31: 1 [IU] via SUBCUTANEOUS
  Administered 2015-11-01 (×2): 2 [IU] via SUBCUTANEOUS
  Administered 2015-11-01: 1 [IU] via SUBCUTANEOUS
  Administered 2015-11-02: 5 [IU] via SUBCUTANEOUS
  Administered 2015-11-02: 2 [IU] via SUBCUTANEOUS
  Administered 2015-11-02: 1 [IU] via SUBCUTANEOUS
  Administered 2015-11-02: 3 [IU] via SUBCUTANEOUS
  Administered 2015-11-02: 1 [IU] via SUBCUTANEOUS
  Administered 2015-11-03: 2 [IU] via SUBCUTANEOUS
  Administered 2015-11-03 (×3): 1 [IU] via SUBCUTANEOUS
  Administered 2015-11-04: 2 [IU] via SUBCUTANEOUS
  Administered 2015-11-04: 1 [IU] via SUBCUTANEOUS
  Administered 2015-11-04: 2 [IU] via SUBCUTANEOUS
  Administered 2015-11-04: 1 [IU] via SUBCUTANEOUS
  Administered 2015-11-05 (×2): 2 [IU] via SUBCUTANEOUS
  Administered 2015-11-05 – 2015-11-06 (×4): 1 [IU] via SUBCUTANEOUS
  Administered 2015-11-06: 2 [IU] via SUBCUTANEOUS
  Administered 2015-11-06 – 2015-11-08 (×11): 1 [IU] via SUBCUTANEOUS
  Administered 2015-11-08: 2 [IU] via SUBCUTANEOUS
  Administered 2015-11-08: 1 [IU] via SUBCUTANEOUS
  Administered 2015-11-08: 2 [IU] via SUBCUTANEOUS
  Administered 2015-11-08 – 2015-11-09 (×5): 1 [IU] via SUBCUTANEOUS
  Administered 2015-11-10: 2 [IU] via SUBCUTANEOUS

## 2015-10-31 NOTE — Progress Notes (Signed)
PULMONARY / CRITICAL CARE MEDICINE   Name: Joanna Farmer MRN: [id number] DOB: November 19, 1955    ADMISSION DATE:  09/29/2015 CONSULTATION DATE:  09/29/2015  REFERRING MD:  Ezequiel Essex, M.D. / [facility]  CHIEF COMPLAINT:  Altered mental status, thalamic bleed.  BRIEF   60 year old female past medical history as below, which is significant for hypertension, alcohol abuse, cocaine abuse, HSV, coronary artery disease status post stents to RCA and LAD in 2015, and ischemic cardiomyopathy with LV EF 35-40%. She presented to [facility] emergency department 6/15 with altered mental status. Reportedly the night before she suffered a fall during the night, and since that time has been sleeping all day. She was brought to the emergency department with this complaint. In emergency department she was unresponsive but reportedly breathing adequately. She had no spontaneous movement and left gaze deviation, but did have a positive gag reflex. CT scan of the head was obtained and demonstrated acute intracranial hemorrhage with intraventricular extension. Cervical spine was without radiographic evidence of injury. She was intubated for airway protection with anticipation of transfer to [facility]. PCCM accepting.   SUBJECTIVE/OVERNIGHT/INTERVAL HX Neuro status unchanged.  Secretions better.  For shunt tomorrow.   REVIEW OF SYSTEMS:  Unable to obtain given intubation.  VITAL SIGNS: BP 141/65 mmHg  Pulse 73  Temp(Src) 98.2 F (36.8 C) (Axillary)  Resp 25  Ht 5\' 3"  (1.6 m)  Wt 48 kg (105 lb 13.1 oz)  BMI 18.75 kg/m2  SpO2 95%  HEMODYNAMICS:    VENTILATOR SETTINGS: Vent Mode:  [-]  FiO2 (%):  [28 %] 28 %  INTAKE / OUTPUT: I/O last 3 completed shifts: In: 2115 [NG/GT:1550; IV Piggyback:565] Out: 2241 [Urine:2045; Drains:196]  PHYSICAL EXAMINATION: General:  Ill appearing female, NAD with IVC drain, does not withdraw to pain. Integument:  Warm & dry. No rash on exposed skin.  HEENT:   Drain in place. Dressing c/d, No scleral injection or icterus. no significant oral secretions. Cardiovascular:  RRR, no M/R/G and SEM at LUSB. Pulmonary:  Resps even non labored on ATC, Coarse bilaterally, fewer secretions  Abdomen: Soft. Normal bowel sounds. Nondistended. TF at goal via PEG Musculoskeletal:  Normal bulk. No joint deformity or effusion appreciated. Neurological: NO sedation running. RASS-3 equivalent, unresponsive and not withdrawing to pain  LABS: PULMONARY No results for input(s): PHART, PCO2ART, PO2ART, HCO3, TCO2, O2SAT in the last 168 hours.  Invalid input(s): PCO2, PO2 CBC  Recent Labs Lab 10/25/15 0248 10/31/15 0333  HGB 9.2* 8.4*  HCT 27.9* 26.0*  WBC 7.6 7.0  PLT 282 277   COAGULATION No results for input(s): INR in the last 168 hours. CARDIAC  No results for input(s): TROPONINI in the last 168 hours. No results for input(s): PROBNP in the last 168 hours.  CHEMISTRY  Recent Labs Lab 10/25/15 0248 10/29/15 0314 10/31/15 0333  NA 139 138 134*  K 4.1 4.1 4.1  CL 104 102 101  CO2 27 27 25   GLUCOSE 146* 181* 190*  BUN 15 22* 16  CREATININE 0.49 0.70 0.49  CALCIUM 10.2 10.0 9.7   Estimated Creatinine Clearance: 56.7 mL/min (by C-G formula based on Cr of 0.49).  LIVER No results for input(s): AST, ALT, ALKPHOS, BILITOT, PROT, ALBUMIN, INR in the last 168 hours. INFECTIOUS No results for input(s): LATICACIDVEN, PROCALCITON in the last 168 hours. ENDOCRINE CBG (last 3)   Recent Labs  10/30/15 2331 10/31/15 0320 10/31/15 0759  GLUCAP 187* 204* 179*   IMAGING x48h No results  found.   SIGNIFICANT EVENTS: 6/15 - Admit to ICU on ventilator for ICH after transfer from [facility] ED 6/24 - Extubated  6/25 - Reintubated   STUDIES:  CT Head & C-spine 6/15: Acute intracranial hemorrhage with intraventricular extension and possible developing hydrocephalus Although no fractures identified in the cervical spine, the evaluation of the cervical spine  is limited. CT Head 6/19: no significant change since compared to prior scan. CT Head 6/20: Degenerating RIGHT thalamus hematoma with local mass effect and edema. Similar mild hydrocephalus, stable RIGHT frontal ventriculostomy catheter. Degenerating intraventricular blood products.  CT Head 7/5:  Interval ventriculomegaly sparing fourth ventricle with frontal approach EVD positioning appears stable. Expected evolution of hyperdense hemorrhage at center of right cerebral peduncle. Small volume of intraventricular hemorrhage has resolved.  MICROBIOLOGY: Blood Ctx x2 6/15:  Negative  MRSA PCR 6/16:  Negative  HIV 6/17:  Nonreactive RPR 6/17:  Nonreactive  Urine Ctx 6/16:  Citrobacter youngae  Blood Ctx x2 6/20:  Negative Tracheal Asp Ctx 6/20:  Oral [place] CSF Ctx 6/20:  Negative  C diff Stool 7/2:  Negative CSF Ctx 7/9:  Negative  CSF HSV Ab 1.85 (high) CSF HSV 1/2 7/12:  Negative  CSF Ctx 7/15>>>  ANTIBIOTICS: Ancef 6/16 (prophylactic abx) Zosyn 6/20 - 7/2 Rocephin 6/15 - 6/17, 7/15>>> Vancomycin 6/20 - 6/22, 7/15>>>  LINES/TUBES: OETT 7.5 6/15 - 6/24; 6/25 - 7/7 OGT 6/15 - 6/24 IVC Drain 6/16 >>  Trach 6.0 (JY) 7/7 >>  PEG 7/12>> Foley 7/15>> PIV x3  DISCUSSION: 60 year old female past medical history of coronary artery disease, ischemic cardiomyopathy, and polysubstance abuse being admitted with right thalamic intracranial hemorrhage.  Intubated for airway protection. Has failed extubation x 1, now with trach in place, tolerating 24x7 ATC. ventric drain remains in place with plans for shunt 7/18.    ASSESSMENT / PLAN:  NEUROLOGIC A:   Right Thalamic Intracranial Hemorrhage - S/P IVC 6/16 Cocaine Abuse  H/O EtOH Abuse H/O Depression  P:  RASS goal: 0 but not achivevable  Shunt placement planned 7/18 Goal SBP <160 Poor prognosis for any meaningful recover & previously discussed with family  PULMONARY A: Acute Hypoxic Respiratory Failure - Unable to protect  airway secondary to [place]. Suspected HCAP - S/P Tx.  P:   Continue ATC ad lib Wean O2 for sats > 90%. F/U CXR as needed.  CARDIOVASCULAR A:  Chronic Systolic CHF - EF 123456 w/ grade 1 diastolic dysfunction. RV normal. Hypertension - S/P cleviprex ending 10/16/15 H/O CAD - S/P PCI in 2015 H/O Hyperlipidemia  P:  Vitals per unit protocol Continue Telemetry Monitoring BP control with SBP goal < 160 mmHg. Amlodipine 10 mg daily. Carvedilol 12.5 mg BID. Hydralazine IV prn.  RENAL A:   Hyponatremia - Mild. Metabolic Acidosis - Resolved. Hypernatremia - Resolved.  P:   Monitoring UOP with Foley Trending electrolytes & renal function daily Replacing electrolyte as indicated Goal Even I/O Free Water 100cc VT q8hr  GASTROINTESTINAL A:   H/O GERD Diarrhea - C diff neg. Resolved.  P:   Continuing Tube Feedings Pepcid VT daily Senna VT daily Colace VT bid  HEMATOLOGIC A:   Thalamic ICH Anemia - No signs of active bleeding currently.  P:  Intermittent CBC to trend cell counts SCDs Heparin [place] q12hr  INFECTIOUS A:   H/O HSV Infection - CSF PCR negative & Ab positive. H/O HCAP - S/P Tx.  P:   Empiric Vancomycin & Rocephin Day #3 added by Neurosurgery secondary to  indwelling drain Plan to re-culture for fever Awaiting finalization of last CSF Ctx  ENDOCRINE A:   Hyperglycemia - No h/o DM.  P:   Accu-Checks q4hr SSI per Sensitive Algorithm  FAMILY  - Updates: Dr Lamonte Sakai spoke with the patient's son Joanna Farmer and daughter Joanna Farmer by phone 7/11. Suspect dispo to either [place] or [place] would be best given proximity to children No family available 7/17.   Nickolas Madrid, NP 10/31/2015  9:33 AM Pager: [phone] or [phone]  PCCM Attending Note: Patient seen and examined with nurse practitioner. Please refer to her progress note which I have reviewed in detail. 60 year old female admitted with ICH and cocaine abuse. Prognosis for recovery is poor. Patient  planned for shunt placement tomorrow. Continuing tracheostomy collar as tolerated. Ultimately patient will need disposition to LTAC in either [place] or [place]. Continuing empiric and Valtrex per neurosurgery.  I spent a total of 41 minutes of critical care time today caring for the patient and reviewing the patient's electronic medical records.  Sonia Baller Ashok Cordia, M.D. [facility] Pulmonary & Critical Care Pager:  [phone] After 3pm or if no response, call [phone] 3:40 PM 10/31/2015

## 2015-10-31 NOTE — Progress Notes (Signed)
Drain re-opened Exam unchanged May shunt tomorrow if general surgery available

## 2015-10-31 NOTE — Progress Notes (Signed)
Inpatient Diabetes Program Recommendations  AACE/ADA: New Consensus Statement on Inpatient Glycemic Control (2015)  Target Ranges:  Prepandial:   less than 140 mg/dL      Peak postprandial:   less than 180 mg/dL (1-2 hours)      Critically ill patients:  140 - 180 mg/dL   Results for NANDI, TEAHAN (MRN [id number]) as of 10/31/2015 07:47  Ref. Range 10/29/2015 23:53 10/30/2015 03:20 10/30/2015 08:12 10/30/2015 11:40 10/30/2015 16:03 10/30/2015 19:38  Glucose-Capillary Latest Ref Range: 65-99 mg/dL 173 (H) 166 (H) 163 (H) 177 (H) 209 (H) 184 (H)   Results for WYOMIA, MALLOCH (MRN [id number]) as of 10/31/2015 07:47  Ref. Range 10/30/2015 23:31 10/31/2015 03:20  Glucose-Capillary Latest Ref Range: 65-99 mg/dL 187 (H) 204 (H)    Review of Glycemic Control  Diabetes history: No Outpatient Diabetes medications: None Current orders for Inpatient glycemic control: CBGs Q4H  Inpatient Diabetes Program Recommendations: Correction (SSI): Glucose has ranged from 160s-200s mg/dl over the past 24 hours. Please consider ordering Novolog sensitive correction scale (0-9 units) Q4H.    --Will follow patient during hospitalization--  Wyn Quaker RN, MSN, CDE Diabetes Coordinator Inpatient Glycemic Control Team Team Pager: [phone] (8a-5p)

## 2015-11-01 ENCOUNTER — Inpatient Hospital Stay (HOSPITAL_COMMUNITY): Payer: Medicaid Other | Admitting: Anesthesiology

## 2015-11-01 ENCOUNTER — Encounter (HOSPITAL_COMMUNITY)
Admission: EM | Disposition: A | Discharge status: Nursing Home (Includes ICF) | Payer: Self-pay | Source: Home / Self Care | Attending: Internal Medicine

## 2015-11-01 ENCOUNTER — Inpatient Hospital Stay (HOSPITAL_COMMUNITY): Payer: Medicaid Other

## 2015-11-01 ENCOUNTER — Encounter (HOSPITAL_COMMUNITY): Payer: Self-pay | Admitting: Anesthesiology

## 2015-11-01 HISTORY — PX: LAPAROSCOPIC REVISION VENTRICULAR-PERITONEAL (V-P) SHUNT: SHX5924

## 2015-11-01 HISTORY — PX: VENTRICULOPERITONEAL SHUNT: SHX204

## 2015-11-01 LAB — GLUCOSE, CAPILLARY
GLUCOSE-CAPILLARY: 115 mg/dL — AB (ref 65–99)
GLUCOSE-CAPILLARY: 122 mg/dL — AB (ref 65–99)
GLUCOSE-CAPILLARY: 107 mg/dL — AB (ref 65–99)
Glucose-Capillary: 117 mg/dL — ABNORMAL HIGH (ref 65–99)
Glucose-Capillary: 160 mg/dL — ABNORMAL HIGH (ref 65–99)
Glucose-Capillary: 178 mg/dL — ABNORMAL HIGH (ref 65–99)
Glucose-Capillary: 275 mg/dL — ABNORMAL HIGH (ref 65–99)

## 2015-11-01 LAB — CSF CULTURE W GRAM STAIN

## 2015-11-01 LAB — CSF CULTURE: CULTURE: NO GROWTH

## 2015-11-01 SURGERY — SHUNT INSERTION VENTRICULAR-PERITONEAL
Anesthesia: General | Laterality: Right

## 2015-11-01 MED ORDER — SODIUM CHLORIDE 0.9 % IV SOLN
10.0000 mg | INTRAVENOUS | Status: DC | PRN
  Administered 2015-11-01: 50 ug/min via INTRAVENOUS

## 2015-11-01 MED ORDER — VANCOMYCIN HCL 1000 MG IV SOLR
INTRAVENOUS | Status: AC
  Filled 2015-11-01: qty 1000

## 2015-11-01 MED ORDER — MIDAZOLAM HCL 2 MG/2ML IJ SOLN
INTRAMUSCULAR | Status: AC
  Filled 2015-11-01: qty 2

## 2015-11-01 MED ORDER — SODIUM CHLORIDE 0.9 % IR SOLN
Status: DC | PRN
  Administered 2015-11-01: 13:00:00

## 2015-11-01 MED ORDER — EPHEDRINE 5 MG/ML INJ
INTRAVENOUS | Status: AC
  Filled 2015-11-01: qty 10

## 2015-11-01 MED ORDER — FENTANYL CITRATE (PF) 250 MCG/5ML IJ SOLN
INTRAMUSCULAR | Status: AC
  Filled 2015-11-01: qty 5

## 2015-11-01 MED ORDER — SUGAMMADEX SODIUM 200 MG/2ML IV SOLN
INTRAVENOUS | Status: AC
  Filled 2015-11-01: qty 2

## 2015-11-01 MED ORDER — BUPIVACAINE-EPINEPHRINE 0.25% -1:200000 IJ SOLN
INTRAMUSCULAR | Status: DC | PRN
  Administered 2015-11-01: 3 mL

## 2015-11-01 MED ORDER — 0.9 % SODIUM CHLORIDE (POUR BTL) OPTIME
TOPICAL | Status: DC | PRN
  Administered 2015-11-01: 1000 mL

## 2015-11-01 MED ORDER — ROCURONIUM BROMIDE 100 MG/10ML IV SOLN
INTRAVENOUS | Status: DC | PRN
  Administered 2015-11-01: 10 mg via INTRAVENOUS
  Administered 2015-11-01: 40 mg via INTRAVENOUS

## 2015-11-01 MED ORDER — ROCURONIUM BROMIDE 50 MG/5ML IV SOLN
INTRAVENOUS | Status: AC
  Filled 2015-11-01: qty 1

## 2015-11-01 MED ORDER — SODIUM CHLORIDE 0.9 % IV SOLN
INTRAVENOUS | Status: DC | PRN
  Administered 2015-11-01: 13:00:00 via INTRAVENOUS

## 2015-11-01 MED ORDER — BUPIVACAINE-EPINEPHRINE (PF) 0.25% -1:200000 IJ SOLN
INTRAMUSCULAR | Status: AC
  Filled 2015-11-01: qty 30

## 2015-11-01 MED ORDER — LIDOCAINE 2% (20 MG/ML) 5 ML SYRINGE
INTRAMUSCULAR | Status: AC
  Filled 2015-11-01: qty 5

## 2015-11-01 MED ORDER — SUGAMMADEX SODIUM 200 MG/2ML IV SOLN
INTRAVENOUS | Status: DC | PRN
  Administered 2015-11-01: 200 mg via INTRAVENOUS

## 2015-11-01 MED ORDER — VANCOMYCIN HCL 1000 MG IV SOLR
INTRAVENOUS | Status: DC | PRN
  Administered 2015-11-01 (×2): 1000 mg

## 2015-11-01 MED ORDER — ONDANSETRON HCL 4 MG/2ML IJ SOLN
INTRAMUSCULAR | Status: AC
  Filled 2015-11-01: qty 2

## 2015-11-01 MED ORDER — EPHEDRINE SULFATE 50 MG/ML IJ SOLN
INTRAMUSCULAR | Status: DC | PRN
  Administered 2015-11-01: 10 mg via INTRAVENOUS

## 2015-11-01 MED ORDER — ONDANSETRON HCL 4 MG/2ML IJ SOLN
INTRAMUSCULAR | Status: DC | PRN
  Administered 2015-11-01: 4 mg via INTRAVENOUS

## 2015-11-01 MED ORDER — PROPOFOL 10 MG/ML IV BOLUS
INTRAVENOUS | Status: AC
  Filled 2015-11-01: qty 20

## 2015-11-01 MED ORDER — FENTANYL CITRATE (PF) 100 MCG/2ML IJ SOLN
INTRAMUSCULAR | Status: DC | PRN
  Administered 2015-11-01: 100 ug via INTRAVENOUS

## 2015-11-01 SURGICAL SUPPLY — 93 items
ADH SKN CLS APL DERMABOND .7 (GAUZE/BANDAGES/DRESSINGS) ×2
BLADE CLIPPER SURG (BLADE) ×8 IMPLANT
BLADE SURG 10 STRL SS (BLADE) ×4 IMPLANT
BLADE SURG 11 STRL SS (BLADE) ×4 IMPLANT
BLADE SURG 15 STRL LF DISP TIS (BLADE) ×2 IMPLANT
BLADE SURG 15 STRL SS (BLADE) ×2
BOOT SUTURE AID YELLOW STND (SUTURE) IMPLANT
BRUSH SCRUB EZ 1% IODOPHOR (MISCELLANEOUS) ×4 IMPLANT
BUR ACORN 6.0 PRECISION (BURR) ×3 IMPLANT
BUR ACORN 6.0MM PRECISION (BURR) ×1
CANISTER SUCT 3000ML PPV (MISCELLANEOUS) ×4 IMPLANT
CANISTER SUCTION 2500CC (MISCELLANEOUS) IMPLANT
CATH SNAP PUDENZ 6CM (CATHETERS) ×4 IMPLANT
CHLORAPREP W/TINT 26ML (MISCELLANEOUS) ×4 IMPLANT
CLIP RANEY DISP (INSTRUMENTS) IMPLANT
CLOSURE WOUND 1/2 X4 (GAUZE/BANDAGES/DRESSINGS) ×1
CLOSURE WOUND 1/4X4 (GAUZE/BANDAGES/DRESSINGS)
CORDS BIPOLAR (ELECTRODE) ×4 IMPLANT
COVER MAYO STAND STRL (DRAPES) ×4 IMPLANT
DECANTER SPIKE VIAL GLASS SM (MISCELLANEOUS) ×8 IMPLANT
DERMABOND ADVANCED (GAUZE/BANDAGES/DRESSINGS) ×4
DERMABOND ADVANCED .7 DNX12 (GAUZE/BANDAGES/DRESSINGS) ×4 IMPLANT
DRAPE INCISE IOBAN 66X45 STRL (DRAPES) ×4 IMPLANT
DRAPE ORTHO SPLIT 77X108 STRL (DRAPES) ×2
DRAPE POUCH INSTRU U-SHP 10X18 (DRAPES) ×4 IMPLANT
DRAPE PROXIMA HALF (DRAPES) ×4 IMPLANT
DRAPE SURG ORHT 6 SPLT 77X108 (DRAPES) ×2 IMPLANT
DRSG TELFA 3X8 NADH (GAUZE/BANDAGES/DRESSINGS) ×4 IMPLANT
DURAPREP 26ML APPLICATOR (WOUND CARE) ×8 IMPLANT
ELECT CAUTERY BLADE 6.4 (BLADE) ×4 IMPLANT
ELECT REM PT RETURN 9FT ADLT (ELECTROSURGICAL) ×4
ELECTRODE REM PT RTRN 9FT ADLT (ELECTROSURGICAL) ×2 IMPLANT
GAUZE SPONGE 4X4 16PLY XRAY LF (GAUZE/BANDAGES/DRESSINGS) ×8 IMPLANT
GLOVE BIOGEL PI IND STRL 8 (GLOVE) ×2 IMPLANT
GLOVE BIOGEL PI INDICATOR 8 (GLOVE) ×2
GLOVE ECLIPSE 7.0 STRL STRAW (GLOVE) ×4 IMPLANT
GLOVE ECLIPSE 7.5 STRL STRAW (GLOVE) ×4 IMPLANT
GLOVE EXAM NITRILE LRG STRL (GLOVE) IMPLANT
GLOVE EXAM NITRILE MD LF STRL (GLOVE) IMPLANT
GLOVE EXAM NITRILE XL STR (GLOVE) IMPLANT
GLOVE EXAM NITRILE XS STR PU (GLOVE) IMPLANT
GLOVE INDICATOR 7.5 STRL GRN (GLOVE) ×4 IMPLANT
GLOVE INDICATOR 8.0 STRL GRN (GLOVE) ×4 IMPLANT
GLOVE SURG SS PI 7.5 STRL IVOR (GLOVE) ×4 IMPLANT
GOWN STRL NON-REIN LRG LVL3 (GOWN DISPOSABLE) ×8 IMPLANT
GOWN STRL REUS W/ TWL LRG LVL3 (GOWN DISPOSABLE) ×4 IMPLANT
GOWN STRL REUS W/ TWL XL LVL3 (GOWN DISPOSABLE) ×2 IMPLANT
GOWN STRL REUS W/TWL 2XL LVL3 (GOWN DISPOSABLE) IMPLANT
GOWN STRL REUS W/TWL LRG LVL3 (GOWN DISPOSABLE) ×4
GOWN STRL REUS W/TWL XL LVL3 (GOWN DISPOSABLE) ×2
HEMOSTAT SURGICEL 2X14 (HEMOSTASIS) IMPLANT
INSUFFLATION NEEDLE 120MM ×4 IMPLANT
KIT BASIN OR (CUSTOM PROCEDURE TRAY) ×4 IMPLANT
KIT ROOM TURNOVER OR (KITS) ×4 IMPLANT
LIQUID BAND (GAUZE/BANDAGES/DRESSINGS) ×8 IMPLANT
MARKER SKIN DUAL TIP RULER LAB (MISCELLANEOUS) ×4 IMPLANT
NEEDLE HYPO 21X1.5 SAFETY (NEEDLE) ×4 IMPLANT
NEEDLE HYPO 25X1 1.5 SAFETY (NEEDLE) ×8 IMPLANT
NS IRRIG 1000ML POUR BTL (IV SOLUTION) ×4 IMPLANT
PACK EENT II TURBAN DRAPE (CUSTOM PROCEDURE TRAY) ×4 IMPLANT
PAD ARMBOARD 7.5X6 YLW CONV (MISCELLANEOUS) ×12 IMPLANT
PASSER CATH 36 CODMAN DISP (NEUROSURGERY SUPPLIES) ×4 IMPLANT
PASSER CATH 65CM DISP (NEUROSURGERY SUPPLIES) ×4 IMPLANT
PATTIES SURGICAL .5 X.5 (GAUZE/BANDAGES/DRESSINGS) IMPLANT
PENCIL BUTTON HOLSTER BLD 10FT (ELECTRODE) ×4 IMPLANT
SHEATH COOK PEEL AWAY SET 9F (SHEATH) ×4 IMPLANT
SHUNT SNAP ASSEMBLY DELTA 90CM (SHUNT) ×3 IMPLANT
SLEEVE ENDOPATH XCEL 5M (ENDOMECHANICALS) ×4 IMPLANT
SPONGE LAP 4X18 X RAY DECT (DISPOSABLE) ×4 IMPLANT
SPONGE SURGIFOAM ABS GEL 12-7 (HEMOSTASIS) IMPLANT
STAPLER SKIN PROX WIDE 3.9 (STAPLE) IMPLANT
STAPLER VISISTAT 35W (STAPLE) ×4 IMPLANT
STRIP CLOSURE SKIN 1/2X4 (GAUZE/BANDAGES/DRESSINGS) ×3 IMPLANT
STRIP CLOSURE SKIN 1/4X4 (GAUZE/BANDAGES/DRESSINGS) IMPLANT
SUT BONE WAX W31G (SUTURE) ×4 IMPLANT
SUT ETHILON 3 0 PS 1 (SUTURE) ×4 IMPLANT
SUT MNCRL AB 4-0 PS2 18 (SUTURE) ×4 IMPLANT
SUT NURALON 4 0 TR CR/8 (SUTURE) IMPLANT
SUT SILK 0 TIES 10X30 (SUTURE) ×4 IMPLANT
SUT SILK 3 0 SH 30 (SUTURE) IMPLANT
SUT VIC AB 2-0 CT2 18 VCP726D (SUTURE) ×4 IMPLANT
SUT VIC AB 3-0 SH 8-18 (SUTURE) ×4 IMPLANT
SYR 30ML LL (SYRINGE) ×4 IMPLANT
SYR BULB 3OZ (MISCELLANEOUS) ×4 IMPLANT
SYR CONTROL 10ML LL (SYRINGE) ×8 IMPLANT
TOWEL OR 17X24 6PK STRL BLUE (TOWEL DISPOSABLE) ×4 IMPLANT
TOWEL OR 17X26 10 PK STRL BLUE (TOWEL DISPOSABLE) ×4 IMPLANT
TROCAR XCEL BLUNT TIP 100MML (ENDOMECHANICALS) IMPLANT
TROCAR XCEL NON-BLD 5MMX100MML (ENDOMECHANICALS) ×4 IMPLANT
TUBE CONNECTING 12'X1/4 (SUCTIONS) ×1
TUBE CONNECTING 12X1/4 (SUCTIONS) ×3 IMPLANT
UNDERPAD 30X30 INCONTINENT (UNDERPADS AND DIAPERS) ×4 IMPLANT
WATER STERILE IRR 1000ML POUR (IV SOLUTION) ×4 IMPLANT

## 2015-11-01 NOTE — NC FL2 (Signed)
[place] LEVEL OF CARE SCREENING TOOL     IDENTIFICATION  Patient Name: Joanna Farmer Birthdate: 1955-11-09 Sex: female Admission Date (Current Location): 09/29/2015  [facility] and [name] Number:  [employer] and Address:  [facility] [place], [place] [address], [place], [place] [address]      Provider Number: [id number]  Attending Physician Name and Address:  Collene Gobble, MD  Relative Name and Phone Number:       Current Level of Care: Hospital Recommended Level of Care: [place] Prior Approval Number:    Date Approved/Denied:   PASRR Number:    Discharge Plan: SNF    Current Diagnoses: Patient Active Problem List   Diagnosis Date Noted  . HCAP (healthcare-associated pneumonia)   . Cytotoxic brain edema ([place]) 09/30/2015  . Obstructive hydrocephalus 09/30/2015  . Brain herniation ([place]) 09/30/2015  . Acute respiratory failure ([place])   . ICH (intracerebral hemorrhage) ([place]) 09/29/2015  . Pain in the chest 04/07/2015  . Tobacco abuse 04/07/2015  . Hypokalemia 04/07/2015  . CAD S/P RCA (in 2015 then 03/16/2015) 03/17/2015  . Presence of drug coated stent in right coronary artery 03/17/2015  . Hyperlipidemia with target LDL less than 70   . Post PTCA 03/16/2015  . Cardiomyopathy, ischemic    . Angina pectoris ([place])   . CAP (community acquired pneumonia) 02/01/2015  . Chest pain on exertion 02/01/2015  . Alcohol abuse 02/01/2015  . Chest pain 02/01/2015  . Reflux 01/11/2015  . Gas 01/11/2015  . Essential hypertension 01/04/2014  . Orthostatic hypotension 01/04/2014  . Alcohol dependence ([place]) 12/31/2013  . Cocaine abuse 12/31/2013    Orientation RESPIRATION BLADDER Height & Weight      (Unable to assess)  Tracheostomy ([place] 28mm cuffed, 5L O2, FiO2% = 28) Incontinent Weight: 49 kg (108 lb 0.4 oz) Height:  5\' 3"  (160 cm)  BEHAVIORAL SYMPTOMS/MOOD NEUROLOGICAL BOWEL NUTRITION STATUS   (NONE)  (NONE)  Incontinent Feeding tube (Peg tube, Osmolite 1.2 @ 35ml/hr, Prostate 56ml daily)  AMBULATORY STATUS COMMUNICATION OF NEEDS Skin   Total Care Does not communicate Surgical wounds (Surgical wounds of head and abdomen.)                       Personal Care Assistance Level of Assistance  Total care       Total Care Assistance: Maximum assistance   Functional Limitations Info   (Unable to assess)          SPECIAL CARE FACTORS FREQUENCY                       Contractures Contractures Info: Not present    Additional Factors Info  Code Status, Allergies, Insulin Sliding Scale Code Status Info: Full Code Allergies Info: NKDA   Insulin Sliding Scale Info: 6/day       Current Medications (11/01/2015):  This is the current hospital active medication list Current Facility-Administered Medications  Medication Dose Route Frequency Provider Last Rate Last Dose  . 0.9 %  sodium chloride infusion  250 mL Intravenous PRN Javier Glazier, MD 20 mL/hr at 11/01/15 1100 250 mL at 11/01/15 1100  . acetaminophen (TYLENOL) solution 650 mg  650 mg Per Tube Q4H PRN Wilhelmina Mcardle, MD   650 mg at 10/28/15 2357  . amLODipine (NORVASC) tablet 10 mg  10 mg Per Tube QHS Brand Males, MD   10 mg at 10/31/15 2110  .  antiseptic oral rinse (CPC / CETYLPYRIDINIUM CHLORIDE 0.05%) solution 7 mL  7 mL Mouth Rinse q12n4p Collene Gobble, MD   7 mL at 11/01/15 1600  . bisacodyl (DULCOLAX) suppository 10 mg  10 mg Rectal Daily PRN Collene Gobble, MD   10 mg at 10/27/15 1810  . carvedilol (COREG) tablet 12.5 mg  12.5 mg Oral BID WC Collene Gobble, MD   12.5 mg at 11/01/15 0813  . cefTRIAXone (ROCEPHIN) 2 g in dextrose 5 % 50 mL IVPB  2 g Intravenous Q24H Kevan Ny Ditty, MD   2 g at 11/01/15 [phone]  . chlorhexidine (PERIDEX) 0.12 % solution 15 mL  15 mL Mouth Rinse BID Collene Gobble, MD   15 mL at 11/01/15 0955  . docusate (COLACE) 50 MG/5ML liquid 100 mg  100 mg Oral BID Collene Gobble, MD   100  mg at 10/31/15 2110  . famotidine (PEPCID) 40 MG/5ML suspension 20 mg  20 mg Per Tube QHS Brand Males, MD   20 mg at 10/31/15 2110  . feeding supplement (OSMOLITE 1.2 CAL) liquid 1,000 mL  1,000 mL Per Tube Continuous Collene Gobble, MD   Stopped at 11/01/15 0000  . feeding supplement (PRO-STAT SUGAR FREE 64) liquid 30 mL  30 mL Per Tube Daily Collene Gobble, MD   30 mL at 11/01/15 1000  . free water 100 mL  100 mL Per Tube Q8H Collene Gobble, MD   100 mL at 10/31/15 2200  . heparin injection 5,000 Units  5,000 Units Subcutaneous Q12H Rosalin Hawking, MD   5,000 Units at 10/31/15 2110  . hydrALAZINE (APRESOLINE) injection 10 mg  10 mg Intravenous Q4H PRN Collene Gobble, MD   20 mg at 10/21/15 1011  . insulin aspart (novoLOG) injection 0-9 Units  0-9 Units Subcutaneous Q4H Javier Glazier, MD   1 Units at 11/01/15 [phone]  . levETIRAcetam (KEPPRA) 500 mg in sodium chloride 0.9 % 100 mL IVPB  500 mg Intravenous BID Anders Simmonds, MD   500 mg at 11/01/15 0931  . multivitamin liquid 15 mL  15 mL Per Tube Daily Jose Shirl Harris, MD   15 mL at 10/31/15 0929  . pneumococcal 23 valent vaccine (PNU-IMMUNE) injection 0.5 mL  0.5 mL Intramuscular Prior to discharge Brand Males, MD      . sennosides (SENOKOT) 8.8 MG/5ML syrup 5 mL  5 mL Oral Daily Collene Gobble, MD   5 mL at 10/31/15 0929  . vancomycin (VANCOCIN) 1000 MG powder           . vancomycin (VANCOCIN) 1000 MG powder           . vancomycin (VANCOCIN) IVPB 1000 mg/200 mL premix  1,000 mg Intravenous Q24H Valeda Malm Rumbarger, RPH   1,000 mg at 11/01/15 [id number]   Facility-Administered Medications Ordered in Other Encounters  Medication Dose Route Frequency Provider Last Rate Last Dose  . 0.9 %  sodium chloride infusion    Continuous PRN Kyung Rudd, CRNA      . ePHEDrine injection    Anesthesia Intra-op Kyung Rudd, CRNA   10 mg at 11/01/15 1311  . fentaNYL (SUBLIMAZE) injection    Anesthesia Intra-op Kyung Rudd, CRNA   100 mcg at 11/01/15  1306  . ondansetron (ZOFRAN) injection   Intravenous Anesthesia Intra-op Kyung Rudd, CRNA   4 mg at 11/01/15 1405  . phenylephrine (NEO-SYNEPHRINE) 0.04 mg/mL in sodium chloride  0.9 % 250 mL infusion  10 mg  Continuous PRN Kyung Rudd, CRNA   Stopped at 11/01/15 1419  . rocuronium [facility]) injection    Anesthesia Intra-op Kyung Rudd, CRNA   10 mg at 11/01/15 1328  . sugammadex sodium (BRIDION) injection   Intravenous Anesthesia Intra-op Kyung Rudd, CRNA   200 mg at 11/01/15 1408     Discharge Medications: Please see discharge summary for a list of discharge medications.  Relevant Imaging Results:  Relevant Lab Results:   Additional Information SSN: [id number]. Patient has right frontal ventriculoperitoneal shunt. Working on [employer].  Rigoberto Noel, LCSW

## 2015-11-01 NOTE — Clinical Social Work Placement (Signed)
   CLINICAL SOCIAL WORK PLACEMENT  NOTE  Date:  11/01/2015  Patient Details  Name: DHAMAR HYNDS MRN: [id number] Date of Birth: August 07, 1955  Clinical Social Work is seeking post-discharge placement for this patient at the [place] level of care (*CSW will initial, date and re-position this form in  chart as items are completed):  Yes   Patient/family provided with [facility] Work Department's list of facilities offering this level of care within the geographic area requested by the patient (or if unable, by the patient's family).  Yes   Patient/family informed of their freedom to choose among providers that offer the needed level of care, that participate in Medicare, Medicaid or managed care program needed by the patient, have an available bed and are willing to accept the patient.  Yes   Patient/family informed of [facility]'s ownership interest in [facility] and [facility], as well as of the fact that they are under no obligation to receive care at these facilities.  PASRR submitted to EDS on 11/01/15     PASRR number received on       Existing PASRR number confirmed on       FL2 transmitted to all facilities in geographic area requested by pt/family on 11/01/15     FL2 transmitted to all facilities within larger geographic area on       Patient informed that his/her managed care company has contracts with or will negotiate with certain facilities, including the following:            Patient/family informed of bed offers received.  Patient chooses bed at       Physician recommends and patient chooses bed at      Patient to be transferred to   on  .  Patient to be transferred to facility by       Patient family notified on   of transfer.  Name of family member notified:        PHYSICIAN Please prepare prescriptions, Please sign FL2, Please prepare priority discharge summary, including medications     Additional Comment:     _______________________________________________ Rigoberto Noel, LCSW 11/01/2015, 4:32 PM

## 2015-11-01 NOTE — Progress Notes (Signed)
Spoke with pt's daughter, Ethlyn Daniels, [phone]), and pt's son, Ola Spurr, [phone]).   They have concerns regarding discharge planning and POA issues.  Pt awaiting VP shunt placement later today.  Pt will need PT/OT consults when able to tolerate therapy to determine LOC needed at discharge.  Pt will likely need SNF, as likely unable to tolerate amount of therapy needed for CIR.  Will consult CSW to follow up with family regarding possible SNF at dc.   Advised siblings that they are not able to obtain POA at this time without a lawyer, as pt is not competent to make decisions.    Reinaldo Raddle, RN, BSN  Trauma/Neuro ICU Case Manager [phone]

## 2015-11-01 NOTE — Progress Notes (Signed)
Pharmacy Antibiotic Note  Joanna Farmer is a 60 y.o. female admitted on 09/29/2015 with ICH.  Pharmacy has been consulted for vancomycin dosing due to prolonged EVD. All blood and csf cultures have remained ngtd, urine cx growing insignificant amount of citrobacter. Will obtain vancomcyin trough tomorrow. Today is day #4 of empiric abx, afeb, uop stable.   Plan: - Vancomycin 1gm IV Q24H - F/u renal fxn, C&S, clinical status and trough at SS - Ceftriaxone 2g/24h   Height: 5\' 3"  (160 cm) Weight: 108 lb 0.4 oz (49 kg) IBW/kg (Calculated) : 52.4  Temp (24hrs), Avg:98.4 F (36.9 C), Min:97.4 F (36.3 C), Max:98.9 F (37.2 C)   Recent Labs Lab 10/29/15 0314 10/31/15 0333  WBC  --  7.0  CREATININE 0.70 0.49    Estimated Creatinine Clearance: 57.8 mL/min (by C-G formula based on Cr of 0.49).    Allergies  Allergen Reactions  . No Known Allergies     Antimicrobials this admission: Rocephin 6/15 >> 6/17; 7/15 >> Vanc 6/20 >>6/22; 7/15 >> Zosyn 6/20 >> 7/  Dose adjustments this admission: N/A  Microbiology results: 6/16 Urine cx: 30k of citrobacter (R - cefazolin) 6/20 Bcx: NGF 6/20 CSF: NGF 6/20 TA: ngF 7/2 Cdiff neg  Thank you for allowing pharmacy to be a part of this patient's care.  Harvel Quale 11/01/2015 10:36 AM

## 2015-11-01 NOTE — Anesthesia Preprocedure Evaluation (Addendum)
Anesthesia Evaluation  Patient identified by MRN, date of birth, ID band Patient unresponsive    Reviewed: Allergy & Precautions, NPO status , Patient's Chart, lab work & pertinent test results, Unable to perform ROS - Chart review only  Airway Mallampati: Trach       Dental   Pulmonary pneumonia, Current Smoker,     + decreased breath sounds      Cardiovascular hypertension, + angina + CAD   Rhythm:Regular Rate:Normal     Neuro/Psych    GI/Hepatic Neg liver ROS, GERD  ,  Endo/Other  negative endocrine ROS  Renal/GU negative Renal ROS     Musculoskeletal   Abdominal   Peds  Hematology   Anesthesia Other Findings   Reproductive/Obstetrics                           Anesthesia Physical Anesthesia Plan  ASA: IV  Anesthesia Plan: General   Post-op Pain Management:    Induction: Inhalational  Airway Management Planned: Tracheostomy  Additional Equipment:   Intra-op Plan:   Post-operative Plan: Post-operative intubation/ventilation  Informed Consent: I have reviewed the patients History and Physical, chart, labs and discussed the procedure including the risks, benefits and alternatives for the proposed anesthesia with the patient or authorized representative who has indicated his/her understanding and acceptance.     Plan Discussed with: Anesthesiologist and CRNA  Anesthesia Plan Comments:        Anesthesia Quick Evaluation

## 2015-11-01 NOTE — Clinical Social Work Note (Signed)
CSW was notified by [facility] that the patient's family is requesting to speak with CSW regarding SNF placement. CSW made multiple phone calls to son and daughter. Messages left requesting call back.   Liz Beach MSW, [place], [place], [phone]

## 2015-11-01 NOTE — Care Management Note (Signed)
Case Management Note  Patient Details  Name: Joanna Farmer MRN: [id number] Date of Birth: 1955-07-04  Subjective/Objective: Pt for VP shunt placement today.                     Action/Plan: Pt will need PT/OT consults when able to tolerate therapy to determine LOC needed at dc.  Pt likely will need SNF at dc, and CSW consulted to facilitate possible dc to SNF when medically stable for dc.    Expected Discharge Date:                  Expected Discharge Plan:  Skilled Nursing Facility  In-House Referral:  Clinical Social Work  Discharge planning Services  CM Consult  Post Acute Care Choice:    Choice offered to:     DME Arranged:    DME Agency:     HH Arranged:    [place] Agency:     Status of Service:  In process, will continue to follow  If discussed at Long Length of Stay Meetings, dates discussed:    Additional Comments:  Reinaldo Raddle, RN, BSN  Trauma/Neuro ICU Case Manager [phone]

## 2015-11-01 NOTE — Op Note (Signed)
11/01/2015  2:07 PM  PATIENT:  Joanna Farmer  60 y.o. female  PRE-OPERATIVE DIAGNOSIS:  Hydrocephalus  POST-OPERATIVE DIAGNOSIS:  same  PROCEDURE:  Procedure(s): SHUNT INSERTION VENTRICULAR-PERITONEAL (Right) LAPAROSCOPIC INSERTION VENTRICULAR-PERITONEAL (V-P) SHUNT  SURGEON:  Surgeon(s) and Role: Panel 1:     * Ralene Ok, MD - Primary* Kevan Ny Ditty, MD - Primary  Panel 2:   * Kevan Ny Ditty, MD - Primary  ANESTHESIA:   local and general  EBL: <5cc  Total I/O In: 30 [I.V.:80; IV [id number] Out: 23 [Urine:880; Drains:38]  BLOOD ADMINISTERED:none  DRAINS: Ventriculostomy Drain in the within the abdomen   LOCAL MEDICATIONS USED:  BUPIVICAINE   SPECIMEN:  No Specimen  DISPOSITION OF SPECIMEN:  N/A  COUNTS:  YES  TOURNIQUET:  * No tourniquets in log *  DICTATION: .Dragon Dictation  After the patient was consented, she was taken back to the OR from the ICU and placed in the supine position with bilateral SCDs in place. Patient was prepped and draped in the standard fashion. There was called all facts verified.  Dr. Cyndy Freeze performed the ventricular portion of the drain placement. That will be dictated under separate cover.  A Veress needle technique was used to insufflate the abdomen to 15 mmHg in the right lower quadrant. Subsequent to his family trocar and camera then placed intra-abdominally. There is no injury to intra-abdominal organs. At this time an area just 3 cm inferior to the xiphoid was chosen for the entry of the CAPD catheter. Prior to insertion of the catheter CSF was easily able to be visualized drain into the catheter. The catheter was tunneled towards the cephalad portion of the tract a Seldinger technique was used to introduce the catheter into the abdominal cavity. There is large portion of the catheter that was placed over the omentum.   at this time the insufflation was evacuated the family trocar was removed. The incision site  in the subxiphoid area was reapproximated using 4-0 Monocryl subcuticular fashion the father trocar site was reapproximated using 4-0 Monocryl in subsequent fashion. Steri-Strips, gauze, tape were used to dress the incision sites. The patient tied the procedure well and the patient was then taken back to ICU in stable condition.   PLAN OF CARE: Admitted to ICU  PATIENT DISPOSITION:  PACU - hemodynamically stable.   Delay start of Pharmacological VTE agent (>24hrs) due to surgical blood loss or risk of bleeding: not applicable

## 2015-11-01 NOTE — Progress Notes (Signed)
No issues post-op Stable CT in AM

## 2015-11-01 NOTE — Transfer of Care (Signed)
Immediate Anesthesia Transfer of Care Note  Patient: Joanna Farmer  Procedure(s) Performed: Procedure(s): SHUNT INSERTION VENTRICULAR-PERITONEAL ,laparoscopic insertion (Right) LAPAROSCOPIC INSERTION VENTRICULAR-PERITONEAL (V-P) SHUNT  Patient Location: NICU  Anesthesia Type:General  Level of Consciousness: awake  Airway & Oxygen Therapy: Patient Spontanous Breathing and Patient connected to T-piece oxygen  Post-op Assessment: Report given to RN and Post -op Vital signs reviewed and stable  Post vital signs: Reviewed and stable  Last Vitals:  Filed Vitals:   11/01/15 1115 11/01/15 1200  BP:  124/60  Pulse: 76 71  Temp:  37.1 C  Resp: 21 21    Last Pain: There were no vitals filed for this visit.       Complications: No apparent anesthesia complications

## 2015-11-01 NOTE — Clinical Social Work Note (Signed)
Clinical Social Work Assessment  Patient Details  Name: Joanna Farmer MRN: [id number] Date of Birth: 01/20/1956  Date of referral:  11/01/15               Reason for consult:  Facility Placement, Discharge Planning                Permission sought to share information with:  Facility Medical sales representativeContact Representative, Family Supports Permission granted to share information::  Yes, Verbal Permission Granted  Name::     Joanna Farmer  Agency::  SNFs  Relationship::  Son and MicrobiologistDaughter  Contact Information:     Housing/Transportation Living arrangements for the past 2 months:  Single Family Home Source of Information:  Adult Children Patient Interpreter Needed:  None Criminal Activity/Legal Involvement Pertinent to Current Situation/Hospitalization:  No - Comment as needed Significant Relationships:  Adult Children, Siblings Lives with:  Self Do you feel safe going back to the place where you live?  No Need for family participation in patient care:  Yes (Comment)  Care giving concerns:  The patient's family is very concerned about the patient's discharge plan as they feel the patient is going to likely need long term placement in SNF.   Social Worker assessment / plan:  CSW spoke with patient's son Joanna OkaDan by phone to complete assessment. Joanna OkaDan states that the family have been thinking about what plans they need to make for the patient at time of discharge. Patient's son had several questions regarding the SNF search/placement process. CSW thoroughly explained the SNF search/placement process and answered all of the son's questions. CSW explained that we will need to find a facility that is willing to accept the patient with Medicaid, that can manage her medical needs, and that has a long term bed available. Joanna OkaDan states that he lives in [place] and the patient's daughter lives in [place]. Joanna OkaDan states that the family would prefer to have the patient placed in the [place] or [place] areas as the  majority of the patient's close family members live near [place]. Placement in this area would allow the family to visit more often. CSW discussed need to have Medicaid switched to LTC Medicaid and the need to have the patient's SSDI signed over to the facility she discharges to. CSW will make referrals and follow up with available bed offers.   Employment status:  Disabled (Comment on whether or not currently receiving Disability) Insurance information:  [place] Recommend[place]:  Not assessed at this time Information / Referral to community resources:  Skilled Nursing Facility (Familiy requesting SNF placement. )  Patient/Family's Response to care:  The patient's family is appreciative of the care that has [facility] the [facility]. Dan verbalizes appreciation for CSW's clarification of discharge planning/placement process.   Patient/Family's Understanding of and Emotional Response to Diagnosis, Current Treatment, and Prognosis:  Joanna OkaDan appears to have a good understanding of the patient's diagnosis and prognosis. He mentions several times that the family is prepared for the patient to make know progress but they remain hopefull that she will. Joanna OkaDan mentioned that quality of life is important and that he would not want to continue interventions if the patient makes no progress, but he feels the patient needs to be given a chance. CSW explained that if the family is not ready to make decisions like this at this time, then the patient can also be seen by [facility].   Emotional Assessment Appearance:  Appears stated age Attitude/Demeanor/Rapport:  Unable to Assess Affect (typically observed):  Unable to Assess Orientation:   (Unable to assess.) Alcohol / Substance use:    Psych involvement (Current and /or in the community):  No (Comment)  Discharge Needs  Concerns to be addressed:  Discharge Planning Concerns, Care Coordination Readmission within  the last 30 days:  No Current discharge risk:  Chronically ill, Cognitively Impaired, Physical Impairment, Lives alone, Dependent with Mobility Barriers to Discharge:  Continued Medical Work up   Rigoberto Noel, LCSW 11/01/2015, 3:45 PM

## 2015-11-01 NOTE — Progress Notes (Signed)
PULMONARY / CRITICAL CARE MEDICINE   Name: Joanna Farmer MRN: [id number] DOB: [birth year]    ADMISSION DATE:  09/29/2015 CONSULTATION DATE:  09/29/2015  REFERRING MD:  Ezequiel Essex, M.D. / [facility] EDP  CHIEF COMPLAINT:  Altered mental status, thalamic bleed.  BRIEF   60 year old female past medical history as below, which is significant for hypertension, alcohol abuse, cocaine abuse, HSV, coronary artery disease status post stents to RCA and LAD in 2015, and ischemic cardiomyopathy with LV EF 35-40%. She presented to [facility] emergency department 6/15 with altered mental status. Reportedly the night before she suffered a fall during the night, and since that time has been sleeping all day. She was brought to the emergency department with this complaint. In emergency department she was unresponsive but reportedly breathing adequately. She had no spontaneous movement and left gaze deviation, but did have a positive gag reflex. CT scan of the head was obtained and demonstrated acute intracranial hemorrhage with intraventricular extension. Cervical spine was without radiographic evidence of injury. She was intubated for airway protection with anticipation of transfer to [facility]. PCCM accepting.   SUBJECTIVE/OVERNIGHT/INTERVAL HX Neuro status unchanged.  Scheduled for shunt in OR at noon today. No major events overnight  REVIEW OF SYSTEMS:  Unable to obtain given altered mental status.   VITAL SIGNS: BP 122/65 mmHg  Pulse 82  Temp(Src) 98.9 F (37.2 C) (Axillary)  Resp 23  Ht 5\' 3"  (1.6 m)  Wt 108 lb 0.4 oz (49 kg)  BMI 19.14 kg/m2  SpO2 96%  HEMODYNAMICS:    VENTILATOR SETTINGS: Vent Mode:  [-]  FiO2 (%):  [28 %] 28 %  INTAKE / OUTPUT: I/O last 3 completed shifts: In: 4415.7 [I.V.:2350.7; NG/GT:1750; IV Piggyback:315] Out: 2790 [Urine:2580; Drains:210]  PHYSICAL EXAMINATION: General:  No distress.  Neuro: Comatose. Minimal responsiveness to pain. IVD in  place HEENT:  Moist mucus membranes, No thyromegaly, JVD Cardiovascular:  RRR, no MRG Pulmonary:  Clear, No wheeze or crackles Abdomen: Soft. Normal bowel sounds. Nondistended. PEG in place Musculoskeletal:  Normal tone and bulk. No joint deformity or effusion appreciated. Skin: Intact  LABS: PULMONARY No results for input(s): PHART, PCO2ART, PO2ART, HCO3, TCO2, O2SAT in the last 168 hours.  Invalid input(s): PCO2, PO2 CBC  Recent Labs Lab 10/31/15 0333  HGB 8.4*  HCT 26.0*  WBC 7.0  PLT 277   COAGULATION No results for input(s): INR in the last 168 hours. CARDIAC  No results for input(s): TROPONINI in the last 168 hours. No results for input(s): PROBNP in the last 168 hours.  CHEMISTRY  Recent Labs Lab 10/29/15 0314 10/31/15 0333  NA 138 134*  K 4.1 4.1  CL 102 101  CO2 27 25  GLUCOSE 181* 190*  BUN 22* 16  CREATININE 0.70 0.49  CALCIUM 10.0 9.7   Estimated Creatinine Clearance: 57.8 mL/min (by C-G formula based on Cr of 0.49).  LIVER No results for input(s): AST, ALT, ALKPHOS, BILITOT, PROT, ALBUMIN, INR in the last 168 hours. INFECTIOUS No results for input(s): LATICACIDVEN, PROCALCITON in the last 168 hours. ENDOCRINE CBG (last 3)   Recent Labs  10/31/15 2330 11/01/15 0331 11/01/15 0800  GLUCAP 160* 115* 122*   IMAGING x48h No results found.   SIGNIFICANT EVENTS: 6/15 - Admit to ICU on ventilator for ICH after transfer from [place] ED 6/24 - Extubated  6/25 - Reintubated   STUDIES:  CT Head & C-spine 6/15: Acute intracranial hemorrhage with intraventricular extension and possible developing hydrocephalus Although  no fractures identified in the cervical spine, the evaluation of the cervical spine is limited. CT Head 6/19: no significant change since compared to prior scan. CT Head 6/20: Degenerating RIGHT thalamus hematoma with local mass effect and edema. Similar mild hydrocephalus, stable RIGHT frontal ventriculostomy catheter. Degenerating  intraventricular blood products.  CT Head 7/5:  Interval ventriculomegaly sparing fourth ventricle with frontal approach EVD positioning appears stable. Expected evolution of hyperdense hemorrhage at center of right cerebral peduncle. Small volume of intraventricular hemorrhage has resolved.  MICROBIOLOGY: Blood Ctx x2 6/15:  Negative  MRSA PCR 6/16:  Negative  HIV 6/17:  Nonreactive RPR 6/17:  Nonreactive  Urine Ctx 6/16:  Citrobacter youngae  Blood Ctx x2 6/20:  Negative Tracheal Asp Ctx 6/20:  Oral Flora CSF Ctx 6/20:  Negative  C diff Stool 7/2:  Negative CSF Ctx 7/9:  Negative  CSF HSV Ab 1.85 (high) CSF HSV 1/2 7/12:  Negative  CSF Ctx 7/15>>>  ANTIBIOTICS: Ancef 6/16 (prophylactic abx) Zosyn 6/20 - 7/2 Rocephin 6/15 - 6/17, 7/15>>> Vancomycin 6/20 - 6/22, 7/15>>>  LINES/TUBES: OETT 7.5 6/15 - 6/24; 6/25 - 7/7 OGT 6/15 - 6/24 IVC Drain 6/16 >>  Trach 6.0 (JY) 7/7 >>  PEG 7/12>> Foley 7/15>> PIV x3  DISCUSSION: 60 year old female past medical history of coronary artery disease, ischemic cardiomyopathy, and polysubstance abuse being admitted with right thalamic intracranial hemorrhage.  Intubated for airway protection. Has failed extubation x 1, now with trach in place, tolerating 24x7 ATC. ventric drain remains in place with plans for shunt 7/18.    ASSESSMENT / PLAN:  NEUROLOGIC A:   Right Thalamic Intracranial Hemorrhage - S/P IVC 6/16 Cocaine Abuse  H/O EtOH Abuse H/O Depression  P:  RASS goal: 0 but not achivevable  Shunt placement planned 7/18 Goal SBP <160 Poor prognosis for any meaningful recover & previously discussed with family  PULMONARY A: Acute Hypoxic Respiratory Failure - Unable to protect airway secondary to [place]. Suspected HCAP - S/P Tx.  P:   Continue trach collar Wean O2 for sats > 90%. Intermittent CXR.  CARDIOVASCULAR A:  Chronic Systolic CHF - EF 123456 w/ grade 1 diastolic dysfunction. RV normal. Hypertension - S/P cleviprex  ending 10/16/15 H/O CAD - S/P PCI in 2015 H/O Hyperlipidemia  P:  Vitals per unit protocol Continue Telemetry Monitoring BP control with SBP goal < 160 mmHg. Amlodipine 10 mg daily. Carvedilol 12.5 mg BID. Hydralazine IV prn.  RENAL A:   Hyponatremia - Mild. Metabolic Acidosis - Resolved. Hypernatremia - Resolved.  P:   Monitoring UOP with Foley Trending electrolytes & renal function daily Replacing electrolyte as indicated Goal Even I/O Free Water 100cc VT q8hr  GASTROINTESTINAL A:   H/O GERD Diarrhea - C diff neg. Resolved.  P:   Continuing Tube Feedings Pepcid VT daily Senna VT daily Colace VT bid  HEMATOLOGIC A:   Thalamic ICH Anemia - No signs of active bleeding currently.  P:  Intermittent CBC to trend cell counts SCDs Heparin [place] q12hr  INFECTIOUS A:   H/O HSV Infection - CSF PCR negative & Ab positive. H/O HCAP - S/P Tx.  P:   Empiric Vancomycin & Rocephin Day #4 as per Neurosurgery secondary to indwelling drain Re-culture for fever  ENDOCRINE A:   Hyperglycemia - No h/o DM.  P:   Accu-Checks q4hr SSI per Sensitive Algorithm  FAMILY  - Updates: Dr Lamonte Sakai spoke with the patient's son Linna Hoff and daughter Lenna Sciara by phone 7/11. Suspect dispo to either [facility]  or [facility] would be best given proximity to children No family available 7/17.   Critical care time- 35 mins.  Marshell Garfinkel MD [facility] Pager [phone] If no answer or after 3pm call: [phone] 11/01/2015, 8:30 AM

## 2015-11-01 NOTE — Anesthesia Procedure Notes (Signed)
Date/Time: 11/01/2015 1:02 PM Performed by: Kyung Rudd Pre-anesthesia Checklist: Patient identified, Emergency Drugs available, Suction available, Patient being monitored and Timeout performed Patient Re-evaluated:Patient Re-evaluated prior to inductionOxygen Delivery Method: Circle system utilized Preoxygenation: Pre-oxygenation with 100% oxygen Intubation Type: Tracheostomy and Inhalational induction Airway Equipment and Method: Tracheostomy Placement Confirmation: positive ETCO2 and breath sounds checked- equal and bilateral

## 2015-11-01 NOTE — Op Note (Signed)
09/29/2015 - 11/01/2015  2:13 PM  PATIENT:  Joanna Farmer  60 y.o. female  PRE-OPERATIVE DIAGNOSIS:  Noncommunicating hydrocephalus, right thalamic hematoma with intraventricular hemorrhage  POST-OPERATIVE DIAGNOSIS:  Same  PROCEDURE:  Placement of right frontal ventriculoperitoneal shunt with laparoscopic assistance  SURGEON:  Aldean Ast, MD  CO-SURGEON: Ralene Ok, MD  ANESTHESIA:   General  DRAINS: None   SPECIMEN:  None  INDICATION FOR PROCEDURE: Patient with thalamic hematoma and obstructive hydrocephalus despite multiple attempts to wean external ventricular drain.  Family understood the risks, benefits, and alternatives and potential outcomes and wished to proceed.  PROCEDURE DETAILS: After smooth induction of general endotracheal anesthesia the right frontal-temporo-parietal area was clipped of hair and the entire operative area was wiped down with alcohol. The patient was then prepped and draped in the usual sterile fashion.  The existing external ventricular drain incision was extended. This was then opened with a self-retaining retractor. The existing burr hole was cleaned with curettes. A hemostat was applied to the existing catheter and it was then cut distal to this. The existing catheter was then removed.  A stab incision was made approximately two finger breadths below the xiphoid process. The shunt tunneler with a plastic sheath was then passed from there to the retroauricular area and the plastic sheath was left in place. This was then repeated from the frontal incision to the retroauricular incision. The valve-distal catheter assembly was then passed from the frontal area to the abdomen. Please see Dr. Johney Frame note for the details of his procedure.  Attention returned to the cranial incision.  The remaining proximal catheter was then removed and a 6 cm ventricular catheter was soft passed with brisk flow of spinal fluid. This catheter was then  connected to the rickham reservoir of the snap on shunt assembly. The wounds were irrigated and vancomycin powder was placed in the wound. The frontal incision was closed with interrupted vicryl vertical mattress sutures and staples. The retroauricular incision was closed with a running nylon suture. Sterile occlusive dressings were applied.  The patient was returned to the ICU on the ventilator   PATIENT DISPOSITION:  ICU - intubated and hemodynamically stable.   Delay start of Pharmacological VTE agent (>24hrs) due to surgical blood loss or risk of bleeding:  yes

## 2015-11-01 NOTE — Progress Notes (Signed)
No acute events AVSS No change in exam EVD patent Shunt today

## 2015-11-02 ENCOUNTER — Inpatient Hospital Stay (HOSPITAL_COMMUNITY): Payer: Medicaid Other

## 2015-11-02 LAB — CBC
HEMATOCRIT: 25.4 % — AB (ref 36.0–46.0)
HEMOGLOBIN: 8.4 g/dL — AB (ref 12.0–15.0)
MCH: 31.1 pg (ref 26.0–34.0)
MCHC: 33.1 g/dL (ref 30.0–36.0)
MCV: 94.1 fL (ref 78.0–100.0)
Platelets: 276 10*3/uL (ref 150–400)
RBC: 2.7 MIL/uL — AB (ref 3.87–5.11)
RDW: 13.7 % (ref 11.5–15.5)
WBC: 7 10*3/uL (ref 4.0–10.5)

## 2015-11-02 LAB — GLUCOSE, CAPILLARY
GLUCOSE-CAPILLARY: 225 mg/dL — AB (ref 65–99)
GLUCOSE-CAPILLARY: 166 mg/dL — AB (ref 65–99)
GLUCOSE-CAPILLARY: 127 mg/dL — AB (ref 65–99)
Glucose-Capillary: 132 mg/dL — ABNORMAL HIGH (ref 65–99)
Glucose-Capillary: 108 mg/dL — ABNORMAL HIGH (ref 65–99)
Glucose-Capillary: 119 mg/dL — ABNORMAL HIGH (ref 65–99)

## 2015-11-02 LAB — BASIC METABOLIC PANEL
ANION GAP: 9 (ref 5–15)
BUN: 14 mg/dL (ref 6–20)
CHLORIDE: 102 mmol/L (ref 101–111)
CO2: 25 mmol/L (ref 22–32)
Calcium: 9.4 mg/dL (ref 8.9–10.3)
Creatinine, Ser: 0.57 mg/dL (ref 0.44–1.00)
GFR calc non Af Amer: >60 mL/min (ref >60)
Glucose, Bld: 186 mg/dL — ABNORMAL HIGH (ref 65–99)
Potassium: 4.5 mmol/L (ref 3.5–5.1)
SODIUM: 136 mmol/L (ref 135–145)

## 2015-11-02 LAB — VANCOMYCIN, TROUGH: VANCOMYCIN TR: 7 ug/mL — AB (ref 15–20)

## 2015-11-02 MED ORDER — FREE WATER
100.0000 mL | Freq: Two times a day (BID) | Status: DC
  Administered 2015-11-02 – 2015-11-10 (×16): 100 mL

## 2015-11-02 MED ORDER — VANCOMYCIN HCL IN DEXTROSE 750-5 MG/150ML-% IV SOLN
750.0000 mg | Freq: Two times a day (BID) | INTRAVENOUS | Status: DC
  Filled 2015-11-02 (×2): qty 150

## 2015-11-02 NOTE — Progress Notes (Addendum)
11/02/2015 patient transfer form 16m to 2c at 1618. Patient respond to pain, not aware of person, time,place and situation. Patient have no wound noted. Have clear dressing on right side of head and back some bloody drain noted, dermaband to abdomen. Feet is dry, sacrum no broken area. Patient had a foley cath per RN on 12m patient had some urinary retention. Foam dressing was place on sacrum. Patient was cleanse with CHG bath. She have a trach and was on trach collar at 28% and satting in the 90's and respiratory was made aware patient was on unit. [facility] RN.

## 2015-11-02 NOTE — Progress Notes (Signed)
No acute events Exam unchanged CT looks good No further active neurosurgical issues Will sign off Feel free to call with questions

## 2015-11-02 NOTE — Anesthesia Postprocedure Evaluation (Signed)
Anesthesia Post Note  Patient: Joanna Farmer  Procedure(s) Performed: Procedure(s) (LRB): SHUNT INSERTION VENTRICULAR-PERITONEAL ,laparoscopic insertion (Right) LAPAROSCOPIC INSERTION VENTRICULAR-PERITONEAL (V-P) SHUNT  Patient location during evaluation: ICU Anesthesia Type: General Level of consciousness: sedated Respiratory status: patient connected to tracheostomy mask oxygen Cardiovascular status: stable Anesthetic complications: no    Last Vitals:  Filed Vitals:   11/02/15 0730 11/02/15 0821  BP:  131/66  Pulse:  82  Temp: 37.2 C   Resp:  22    Last Pain: There were no vitals filed for this visit.               EDWARDS,Farren Landa

## 2015-11-02 NOTE — Progress Notes (Signed)
PULMONARY / CRITICAL CARE MEDICINE   Name: Joanna Farmer MRN: [id number] DOB: [birth year]    ADMISSION DATE:  09/29/2015 CONSULTATION DATE:  09/29/2015  REFERRING MD:  Ezequiel Essex, M.D. / [facility]  CHIEF COMPLAINT:  Altered mental status, thalamic bleed.  BRIEF   60 year old female past medical history as below, which is significant for hypertension, alcohol abuse, cocaine abuse, HSV, coronary artery disease status post stents to RCA and LAD in 2015, and ischemic cardiomyopathy with LV EF 35-40%. She presented to [facility] emergency department 6/15 with altered mental status. Reportedly the night before she suffered a fall during the night, and since that time has been sleeping all day. She was brought to the emergency department with this complaint. In emergency department she was unresponsive but reportedly breathing adequately. She had no spontaneous movement and left gaze deviation, but did have a positive gag reflex. CT scan of the head was obtained and demonstrated acute intracranial hemorrhage with intraventricular extension. Cervical spine was without radiographic evidence of injury. She was intubated for airway protection with anticipation of transfer to [facility]. PCCM accepting.   SUBJECTIVE/OVERNIGHT/INTERVAL HX Neuro status unchanged.  S/p shunt placement 7/18.  No change in mental status.  Remains on ATC.    REVIEW OF SYSTEMS:  Unable to obtain given mental status.   VITAL SIGNS: BP 131/69 mmHg  Pulse 87  Temp(Src) 98.3 F (36.8 C) (Axillary)  Resp 20  Ht 5\' 3"  (1.6 m)  Wt 47.7 kg (105 lb 2.6 oz)  BMI 18.63 kg/m2  SpO2 96%  HEMODYNAMICS:    VENTILATOR SETTINGS: Vent Mode:  [-]  FiO2 (%):  [28 %-35 %] 28 %  INTAKE / OUTPUT: I/O last 3 completed shifts: In: 3975 [I.V.:1020; Other:110; MO:[id number]; IV Piggyback:920] Out: [id number]; Drains:119; Blood:25]  PHYSICAL EXAMINATION: General:  No distress.  Neuro: Comatose. Minimal responsiveness to  pain. IVD removed.  HEENT:  Moist mucus membranes, No thyromegaly, JVD, gown and leftward gaze chronic Cardiovascular:  RRR, no MRG Pulmonary:  Resps even non labored on ATC, Clear, No wheeze or crackles Abdomen: Soft. Normal bowel sounds. Nondistended. PEG in place Musculoskeletal:  Normal tone and bulk. No joint deformity or effusion appreciated. Skin: Intact  LABS: PULMONARY No results for input(s): PHART, PCO2ART, PO2ART, HCO3, TCO2, O2SAT in the last 168 hours.  Invalid input(s): PCO2, PO2 CBC  Recent Labs Lab 10/31/15 0333 11/02/15 0427  HGB 8.4* 8.4*  HCT 26.0* 25.4*  WBC 7.0 7.0  PLT 277 276   COAGULATION No results for input(s): INR in the last 168 hours. CARDIAC  No results for input(s): TROPONINI in the last 168 hours. No results for input(s): PROBNP in the last 168 hours.  CHEMISTRY  Recent Labs Lab 10/29/15 0314 10/31/15 0333 11/02/15 0427  NA 138 134* 136  K 4.1 4.1 4.5  CL 102 101 102  CO2 27 25 25   GLUCOSE 181* 190* 186*  BUN 22* 16 14  CREATININE 0.70 0.49 0.57  CALCIUM 10.0 9.7 9.4   Estimated Creatinine Clearance: 56.3 mL/min (by C-G formula based on Cr of 0.57).  LIVER No results for input(s): AST, ALT, ALKPHOS, BILITOT, PROT, ALBUMIN, INR in the last 168 hours. INFECTIOUS No results for input(s): LATICACIDVEN, PROCALCITON in the last 168 hours. ENDOCRINE CBG (last 3)   Recent Labs  11/01/15 2332 11/02/15 0344 11/02/15 0756  GLUCAP 275* 225* 166*   IMAGING x48h Ct Head Wo Contrast  11/02/2015  CLINICAL DATA:  Follow-up exam status post EXAM:  CT HEAD WITHOUT CONTRAST TECHNIQUE: Contiguous axial images were obtained from the base of the skull through the vertex without intravenous contrast. COMPARISON:  Prior CT from 11/01/2015. FINDINGS: Postoperative changes from interval VP shunt internalization are present. Skin staples at the right frontal scalp. Soft tissue emphysema about the reservoir along the shunt catheter within the right  scalp and neck. Scalp soft tissues otherwise within normal limits. No acute abnormality about the globes and orbits. Visualized paranasal sinuses are largely clear. Small left mastoid effusion, stable. Right mastoid air cells clear. Right frontal approach shunt catheter in place with tip at the foramen of Monro. Hypodensity along the catheter tract within the right frontal lobe is similar. Overall, lateral and third ventricular dilatation appears slightly improved from previous. Persistent hypodensity about the ventricles likely reflects persistent transependymal flow of CSF. Degenerating hemorrhage centered at the right thalamus again seen, stable. Remote lacunar infarct at the right basal ganglia also noted. No new intracranial hemorrhage. No extra-axial fluid collection. No acute infarct. IMPRESSION: 1. Sequela of interval VP shunt internalization. Right frontal approach shunt catheter tip terminates near the foramen of Monro. Lateral and third ventricular dilatation slightly improved relative to 11/01/2015. 2. Continued interval evolution of right thalamic hemorrhage. Electronically Signed   By: Jeannine Boga M.D.   On: 11/02/2015 06:35   Ct Head Wo Contrast  11/01/2015  CLINICAL DATA:  Followup intracranial hemorrhage and intraventricular external drainage catheter. EXAM: CT HEAD WITHOUT CONTRAST TECHNIQUE: Contiguous axial images were obtained from the base of the skull through the vertex without intravenous contrast. COMPARISON:  10/19/2015 FINDINGS: Previously seen right thalamic hemorrhage continues to become less dense. No evidence of additional acute bleeding. Ventriculostomy placed from a right frontal approach enters the frontal horn of the right lateral ventricle in has its tip in apposition to the anterior inferior septum pellucidum. There continues to be hydrocephalus of the lateral and third ventricles an this is probably slightly worsened. There is more low-density around the ventricles  likely due to transependymal resorption. No extra-axial collection.  No primary bone finding. IMPRESSION: Possible ventricular drain malfunction. Slight further increase in size of the lateral and third ventricles, probably with transependymal resorption of CSF. Expected evolutionary changes of the right thalamic hemorrhage, becoming less dense. Electronically Signed   By: Nelson Chimes M.D.   On: 11/01/2015 11:04     SIGNIFICANT EVENTS: 6/15 - Admit to ICU on ventilator for ICH after transfer from [facility] 6/24 - Extubated  6/25 - Reintubated  7/18 - Shunt placed & IVD removed  STUDIES:  CT Head & C-spine 6/15: Acute intracranial hemorrhage with intraventricular extension and possible developing hydrocephalus Although no fractures identified in the cervical spine, the evaluation of the cervical spine is limited. CT Head 6/19: no significant change since compared to prior scan. CT Head 6/20: Degenerating RIGHT thalamus hematoma with local mass effect and edema. Similar mild hydrocephalus, stable RIGHT frontal ventriculostomy catheter. Degenerating intraventricular blood products.  CT Head 7/5:  Interval ventriculomegaly sparing fourth ventricle with frontal approach EVD positioning appears stable. Expected evolution of hyperdense hemorrhage at center of right cerebral peduncle. Small volume of intraventricular hemorrhage has resolved. CT Head 7/19: Interval VP shunt internalization. Lateral and third ventricular dilation slightly improved relative to 7/18. Interval evolution of right thalamic hemorrhage.  MICROBIOLOGY: Blood Ctx x2 6/15:  Negative  MRSA PCR 6/16:  Negative  HIV 6/17:  Nonreactive RPR 6/17:  Nonreactive  Urine Ctx 6/16:  Citrobacter youngae  Blood Ctx x2 6/20:  Negative Tracheal Asp Ctx 6/20:  Oral Flora CSF Ctx 6/20:  Negative  C diff Stool 7/2:  Negative CSF Ctx 7/9:  Negative  CSF HSV Ab 1.85 (high) CSF HSV 1/2 7/12:  Negative  CSF Ctx 7/15:  Negative    ANTIBIOTICS: Ancef 6/16 (prophylactic abx) Zosyn 6/20 - 7/2 Rocephin 6/15 - 6/17, 7/15>>> Vancomycin 6/20 - 6/22, 7/15>>>  LINES/TUBES: OETT 7.5 6/15 - 6/24; 6/25 - 7/7 OGT 6/15 - 6/24 IVC Drain 6/16 - 7/18 (s/p shunt placement)  Trach 6.0 (JY) 7/7 >>  PEG 7/12>> Foley 7/15>> PIV x3  DISCUSSION: 59 year old female past medical history of coronary artery disease, ischemic cardiomyopathy, and polysubstance abuse being admitted with right thalamic intracranial hemorrhage.  Intubated for airway protection. Has failed extubation x 1, now with trach in place, tolerating 24x7 ATC. S/P ventric shunt placement 7/18.    ASSESSMENT / PLAN:  NEUROLOGIC A:   Right Thalamic Intracranial Hemorrhage - S/P IVC 6/16 Cocaine Abuse  H/O EtOH Abuse H/O Depression  P:  RASS goal: 0 but not achivevable  S/P Shunt placement 7/18 Goal SBP <160 Poor prognosis for any meaningful recovery & previously discussed with family  PULMONARY A: Acute Hypoxic Respiratory Failure - Unable to protect airway secondary to Allen. Suspected HCAP - S/P Tx.  P:   Continue trach collar as tol  Wean O2 for sats > 90%. Intermittent CXR.  CARDIOVASCULAR A:  Chronic Systolic CHF - EF [id number] w/ grade 1 diastolic dysfunction. RV normal. Hypertension - S/P cleviprex ending 10/16/15 H/O CAD - S/P PCI in 2015 H/O Hyperlipidemia  P:  Vitals per unit protocol Continue Telemetry Monitoring BP control with SBP goal < 160 mmHg. Amlodipine 10 mg daily. Carvedilol 12.5 mg BID. Hydralazine IV prn.  RENAL A:   Hyponatremia - Mild. Metabolic Acidosis - Resolved. Hypernatremia - Resolved.  P:   Monitoring UOP with Foley Trending electrolytes & renal function daily Replacing electrolyte as indicated Goal Even I/O Decrease Free Water to 100cc q12h  GASTROINTESTINAL A:   H/O GERD Diarrhea - C diff neg. Resolved.  P:   Continuing Tube Feedings Pepcid VT daily Senna VT daily Colace VT bid  HEMATOLOGIC A:    Thalamic ICH Anemia - No signs of active bleeding currently.  P:  Intermittent CBC to trend cell counts SCDs Heparin Dawson q12hr  INFECTIOUS A:   H/O HSV Infection - CSF PCR negative & Ab positive. H/O HCAP - S/P Tx.  P:   Empiric Vancomycin & Rocephin Day #5 as per Neurosurgery secondary to indwelling drain -- ?when to d/c now that drain out.  Re-culture for fever  ENDOCRINE A:   Hyperglycemia - No h/o DM.  P:   Accu-Checks q4hr SSI per Sensitive Algorithm  FAMILY  - Updates: Dr Lamonte Sakai spoke with the patient's son Linna Hoff and daughter Lenna Sciara by phone 7/11. Suspect dispo to either [place] or [facility] would be best given proximity to children No family available 7/17.   SW consult for SNF placement -- may need to search for placement in [place] or [place] where her children are.   Tx to SDU   Will ask [facility] to assume care 7/20 - PCCM will cont to follow intermittently for trach care.   Nickolas Madrid, NP 11/02/2015  9:45 AM Pager: [phone] or [phone]  PCCM Attending Note: Patient seen and examined with nurse practitioner. Please refer to her progress note which I have reviewed in detail. Status post internalization of intraventricular drain yesterday. No acute events.  Review of systems: Unable to obtain given altered mental status.  BP 131/69 mmHg  Pulse 87  Temp(Src) 98.3 F (36.8 C) (Axillary)  Resp 20  Ht 5\' 3"  (1.6 m)  Wt 105 lb 2.6 oz (47.7 kg)  BMI 18.63 kg/m2  SpO2 96% Gen.: Laying in bed. No distress. Eyes closed. Integument: Warm and dry. No rash on exposed skin. Surgical incision over scalp clean, dry, and intact. HEENT: Moist mucous membranes. Tracheostomy in place. Eyes with down and leftward gaze. Pulmonary: Coarse breath sounds bilaterally on tracheostomy collar. Symmetric chest rise. Normal work of breathing.  CBC Latest Ref Rng 11/02/2015 10/31/2015 10/25/2015  WBC 4.0 - 10.5 K/uL 7.0 7.0 7.6  Hemoglobin 12.0 - 15.0 g/dL 8.4(L) 8.4(L)  9.2(L)  Hematocrit 36.0 - 46.0 % 25.4(L) 26.0(L) 27.9(L)  Platelets 150 - 400 K/uL 276 277 282    BMP Latest Ref Rng 11/02/2015 10/31/2015 10/29/2015  Glucose 65 - 99 mg/dL 186(H) 190(H) 181(H)  BUN 6 - 20 mg/dL 14 16 22(H)  Creatinine 0.44 - 1.00 mg/dL 0.57 0.49 0.70  Sodium 135 - 145 mmol/L 136 134(L) 138  Potassium 3.5 - 5.1 mmol/L 4.5 4.1 4.1  Chloride 101 - 111 mmol/L 102 101 102  CO2 22 - 32 mmol/L 25 25 27   Calcium 8.9 - 10.3 mg/dL 9.4 9.7 10.0    CBG (last 3)   Recent Labs  11/01/15 2332 11/02/15 0344 11/02/15 0756  GLUCAP 275* 225* 166*    A/P: 60 year old female status post right thalamic intracranial hemorrhage. Now status post intraventricular drain placement/internalization. Tolerating tracheostomy collar. Status post PEG tube placement. Patient stable for transition to stepdown unit.  1. Right thalamic intracranial hemorrhage: Status post intraventricular drain placement/internalization. Continuing on Keppra. Empiric Rocephin & vancomycin per neurosurgery. 2. Acute hypoxic respiratory failure: Status post tracheostomy placement. Patient can never be decannulated unless mental status improved significantly.  Transfer to SDU. [facility] to assume care as primary & PCCM to follow weekly for tracheostomy issues.  Sonia Baller Ashok Cordia, M.D. [facility] Pulmonary & Critical Care Pager:  [phone] After 3pm or if no response, call [phone] 11:12 AM 11/02/2015

## 2015-11-02 NOTE — Progress Notes (Signed)
PCCM Attending Note: Patient discussed with neurosurgery. Ok to stop antibiotics. Rocephin & Vancomycin stopped now.  Sonia Baller Ashok Cordia, M.D. [facility] Pulmonary & Critical Care Pager:  [phone] After 3pm or if no response, call [phone] 3:46 PM 11/02/2015

## 2015-11-02 NOTE — Clinical Social Work Note (Signed)
Clinical Social Worker continuing to follow patient and family for support and discharge planning needs.  CSW spoke with patient son over the phone briefly, as he was in a training for work, however it has been expressed to expand the search to [facility].  CSW initiated referral to [facility] and will communicate with patient family more regarding potential transportation costs.  Patient has transferred to SDU and CSW provided handoff to unit CSW.   Barbette Or, [place]

## 2015-11-02 NOTE — Progress Notes (Signed)
Pharmacy Antibiotic Note  Joanna Farmer is a 60 y.o. female admitted on 09/29/2015 with ICH.  Pharmacy has been consulted for vancomycin dosing due to prolonged EVD. All blood and csf cultures have remained ngtd, urine cx growing insignificant amount of citrobacter. Vancomycin trough today was 7. Today is day #5 of empiric abx, afeb, uop stable.   Plan: - Change vancomycin to 750mg  Q12H - F/u renal fxn, C&S, clinical status and trough at SS - Ceftriaxone 2g/24h  Height: 5\' 3"  (160 cm) Weight: 105 lb 2.6 oz (47.7 kg) IBW/kg (Calculated) : 52.4  Temp (24hrs), Avg:98.2 F (36.8 C), Min:97.2 F (36.2 C), Max:99 F (37.2 C)   Recent Labs Lab 10/29/15 0314 10/31/15 0333 11/02/15 0427 11/02/15 0818  WBC  --  7.0 7.0  --   CREATININE 0.70 0.49 0.57  --   VANCOTROUGH  --   --   --  7*    Estimated Creatinine Clearance: 56.3 mL/min (by C-G formula based on Cr of 0.57).    Allergies  Allergen Reactions  . No Known Allergies     Antimicrobials this admission: Rocephin 6/15 >> 6/17; 7/15 >> Vanc 6/20 >>6/22; 7/15 >> Zosyn 6/20 >> 7/  Dose adjustments this admission: 7/19: Vancomycin 1g Q24H changed to 750mg  Q12H  Microbiology results: 6/16 Urine cx: 30k of citrobacter (R - cefazolin) 6/20 Bcx: NGF 6/20 CSF: NGF 6/20 TA: ngF 7/2 Cdiff neg 7/9 CSF - neg 7/15 CSF - No growth x2  Thank you for allowing pharmacy to be a part of this patient's care.  Myer Peer Grayland Ormond), PharmD  PGY1 Pharmacy Resident Pager: [phone] 11/02/2015 9:58 AM

## 2015-11-02 NOTE — Progress Notes (Signed)
1 Day Post-Op  Subjective: Baseline unresponsive  Objective: Vital signs in last 24 hours: Temp:  [97.2 F (36.2 C)-99 F (37.2 C)] 99 F (37.2 C) (07/19 0730) Pulse Rate:  [68-85] 82 (07/19 0821) Resp:  [13-22] 22 (07/19 0821) BP: (115-148)/(58-74) 131/66 mmHg (07/19 0821) SpO2:  [94 %-99 %] 96 % (07/19 0821) FiO2 (%):  [28 %-35 %] 28 % (07/19 0821) Weight:  [47.7 kg (105 lb 2.6 oz)] 47.7 kg (105 lb 2.6 oz) (07/19 0500) Last BM Date: 10/27/15 (last documented)  Intake/Output from previous day: 07/18 0701 - 07/19 0700 In: 3175 [I.V.:780; TH:[id number]; IV Piggyback:710] Out: 2343 [Urine:2280; Drains:38; Blood:25] Intake/Output this shift: Total I/O In: 68 [Other:60] Out: 150 [Urine:150]  GI: incisions CDI, PEG in place  Lab Results:   Recent Labs  10/31/15 0333 11/02/15 0427  WBC 7.0 7.0  HGB 8.4* 8.4*  HCT 26.0* 25.4*  PLT 277 276   BMET  Recent Labs  10/31/15 0333 11/02/15 0427  NA 134* 136  K 4.1 4.5  CL 101 102  CO2 25 25  GLUCOSE 190* 186*  BUN 16 14  CREATININE 0.49 0.57  CALCIUM 9.7 9.4   PT/INR No results for input(s): LABPROT, INR in the last 72 hours. ABG No results for input(s): PHART, HCO3 in the last 72 hours.  Invalid input(s): PCO2, PO2  Studies/Results: Ct Head Wo Contrast  11/02/2015  CLINICAL DATA:  Follow-up exam status post EXAM: CT HEAD WITHOUT CONTRAST TECHNIQUE: Contiguous axial images were obtained from the base of the skull through the vertex without intravenous contrast. COMPARISON:  Prior CT from 11/01/2015. FINDINGS: Postoperative changes from interval VP shunt internalization are present. Skin staples at the right frontal scalp. Soft tissue emphysema about the reservoir along the shunt catheter within the right scalp and neck. Scalp soft tissues otherwise within normal limits. No acute abnormality about the globes and orbits. Visualized paranasal sinuses are largely clear. Small left mastoid effusion, stable. Right mastoid air  cells clear. Right frontal approach shunt catheter in place with tip at the foramen of Monro. Hypodensity along the catheter tract within the right frontal lobe is similar. Overall, lateral and third ventricular dilatation appears slightly improved from previous. Persistent hypodensity about the ventricles likely reflects persistent transependymal flow of CSF. Degenerating hemorrhage centered at the right thalamus again seen, stable. Remote lacunar infarct at the right basal ganglia also noted. No new intracranial hemorrhage. No extra-axial fluid collection. No acute infarct. IMPRESSION: 1. Sequela of interval VP shunt internalization. Right frontal approach shunt catheter tip terminates near the foramen of Monro. Lateral and third ventricular dilatation slightly improved relative to 11/01/2015. 2. Continued interval evolution of right thalamic hemorrhage. Electronically Signed   By: Jeannine Boga M.D.   On: 11/02/2015 06:35   Ct Head Wo Contrast  11/01/2015  CLINICAL DATA:  Followup intracranial hemorrhage and intraventricular external drainage catheter. EXAM: CT HEAD WITHOUT CONTRAST TECHNIQUE: Contiguous axial images were obtained from the base of the skull through the vertex without intravenous contrast. COMPARISON:  10/19/2015 FINDINGS: Previously seen right thalamic hemorrhage continues to become less dense. No evidence of additional acute bleeding. Ventriculostomy placed from a right frontal approach enters the frontal horn of the right lateral ventricle in has its tip in apposition to the anterior inferior septum pellucidum. There continues to be hydrocephalus of the lateral and third ventricles an this is probably slightly worsened. There is more low-density around the ventricles likely due to transependymal resorption. No extra-axial collection.  No primary bone  finding. IMPRESSION: Possible ventricular drain malfunction. Slight further increase in size of the lateral and third ventricles,  probably with transependymal resorption of CSF. Expected evolutionary changes of the right thalamic hemorrhage, becoming less dense. Electronically Signed   By: Nelson Chimes M.D.   On: 11/01/2015 11:04    Anti-infectives: Anti-infectives    Start     Dose/Rate Route Frequency Ordered Stop   11/01/15 1340  vancomycin (VANCOCIN) powder  Status:  Discontinued       As needed 11/01/15 1450 11/01/15 1451   11/01/15 1310  bacitracin 50,000 Units in sodium chloride irrigation 0.9 % 500 mL irrigation  Status:  Discontinued       As needed 11/01/15 1410 11/01/15 1437   11/01/15 1207  vancomycin (VANCOCIN) 1000 MG powder    Comments:  [place]cliff, Esther   : cabinet override      11/01/15 1207 11/02/15 0014   11/01/15 1204  vancomycin (VANCOCIN) 1000 MG powder    Comments:  Bryna Colander   : cabinet override      11/01/15 1204 11/02/15 0014   10/29/15 0900  cefTRIAXone (ROCEPHIN) 2 g in dextrose 5 % 50 mL IVPB     2 g 100 mL/hr over 30 Minutes Intravenous Every 24 hours 10/29/15 0824     10/29/15 0900  vancomycin (VANCOCIN) IVPB 1000 mg/200 mL premix     1,000 mg 200 mL/hr over 60 Minutes Intravenous Every 24 hours 10/29/15 0834     10/16/15 1400  metroNIDAZOLE (FLAGYL) 50 mg/ml oral suspension 500 mg  Status:  Discontinued     500 mg Per Tube Every 8 hours 10/16/15 1356 10/16/15 1726   10/04/15 1700  vancomycin (VANCOCIN) IVPB 1000 mg/200 mL premix  Status:  Discontinued     1,000 mg 200 mL/hr over 60 Minutes Intravenous Every 24 hours 10/04/15 1617 10/06/15 1149   10/04/15 1700  piperacillin-tazobactam (ZOSYN) IVPB 3.375 g  Status:  Discontinued     3.375 g 12.5 mL/hr over 240 Minutes Intravenous Every 8 hours 10/04/15 1617 10/16/15 1320   09/30/15 2100  cefTRIAXone (ROCEPHIN) 1 g in dextrose 5 % 50 mL IVPB     1 g 100 mL/hr over 30 Minutes Intravenous Every 24 hours 09/30/15 0914 10/01/15 2049   09/30/15 0130  ceFAZolin (ANCEF) IVPB 1 g/50 mL premix     1 g 100 mL/hr over 30 Minutes  Intravenous  Once 09/30/15 0117 09/30/15 0205   09/29/15 2045  cefTRIAXone (ROCEPHIN) 1 g in dextrose 5 % 50 mL IVPB     1 g 100 mL/hr over 30 Minutes Intravenous  Once 09/29/15 2042 09/29/15 2124      Assessment/Plan: s/p Procedure(s): SHUNT INSERTION VENTRICULAR-PERITONEAL ,laparoscopic insertion (Right) LAPAROSCOPIC INSERTION VENTRICULAR-PERITONEAL (V-P) SHUNT POD#1 Stable from GS standpoint Continue abdominal binder to protect PEG   LOS: 34 days    Lauralee Waters E 11/02/2015

## 2015-11-03 ENCOUNTER — Encounter (HOSPITAL_COMMUNITY): Payer: Self-pay | Admitting: Neurological Surgery

## 2015-11-03 LAB — BASIC METABOLIC PANEL
Anion gap: 8 (ref 5–15)
BUN: 18 mg/dL (ref 6–20)
CALCIUM: 9.7 mg/dL (ref 8.9–10.3)
CO2: 22 mmol/L (ref 22–32)
CREATININE: 0.49 mg/dL (ref 0.44–1.00)
Chloride: 106 mmol/L (ref 101–111)
Glucose, Bld: 139 mg/dL — ABNORMAL HIGH (ref 65–99)
Potassium: 4.6 mmol/L (ref 3.5–5.1)
SODIUM: 136 mmol/L (ref 135–145)

## 2015-11-03 LAB — GLUCOSE, CAPILLARY
GLUCOSE-CAPILLARY: 124 mg/dL — AB (ref 65–99)
GLUCOSE-CAPILLARY: 137 mg/dL — AB (ref 65–99)
Glucose-Capillary: 126 mg/dL — ABNORMAL HIGH (ref 65–99)
Glucose-Capillary: 160 mg/dL — ABNORMAL HIGH (ref 65–99)
Glucose-Capillary: 113 mg/dL — ABNORMAL HIGH (ref 65–99)
Glucose-Capillary: 138 mg/dL — ABNORMAL HIGH (ref 65–99)
Glucose-Capillary: 108 mg/dL — ABNORMAL HIGH (ref 65–99)

## 2015-11-03 LAB — CBC
HCT: 26.1 % — ABNORMAL LOW (ref 36.0–46.0)
Hemoglobin: 8.6 g/dL — ABNORMAL LOW (ref 12.0–15.0)
MCH: 31.2 pg (ref 26.0–34.0)
MCHC: 33 g/dL (ref 30.0–36.0)
MCV: 94.6 fL (ref 78.0–100.0)
Platelets: 297 10*3/uL (ref 150–400)
RBC: 2.76 MIL/uL — ABNORMAL LOW (ref 3.87–5.11)
RDW: 13.9 % (ref 11.5–15.5)
WBC: 6.9 10*3/uL (ref 4.0–10.5)

## 2015-11-03 NOTE — Progress Notes (Signed)
CSW spoke with pt son regarding current offers for SNF- explained that facilities over 50 miles they would need to pay for PTAR bill upfront- son states this would prohibit long distance transfers and is now agreeable to more local options.  Son inquiring about how to keep track of Medicaid and make sure it is up to date- informed they would need to call [place] where pt applied for insurance and inquire about how to proceed.  Pt currently has one bed offer with [facility]- no other offers at this time.  CSW will continue to follow  Domenica Reamer, [place] Social Worker [phone]

## 2015-11-03 NOTE — Progress Notes (Addendum)
Nutrition Follow-up  INTERVENTION:    Continue Osmolite 1.2 formula at goal rate of 50 ml/hr with Prostat liquid protein 30 ml daily  Total TF regimen to provide 1540 kcal, 81 grams protein, and 984 ml of free water  NUTRITION DIAGNOSIS:   Inadequate oral intake related to inability to eat as evidenced by NPO status, ongoing  GOAL:   Patient will meet greater than or equal to 90% of their needs, met  MONITOR:   TF tolerance, Labs, Weight trends, Skin, I & O's  ASSESSMENT:   Pt with past medical history of coronary artery disease, ischemic cardiomyopathy, and polysubstance abuse being admitted with right thalamic intracranial hemorrhage, right frontal external ventricular drain placed 6/16.   Patient s/p procedure 7/18: SHUNT INSERTION VENTRICULAR-PERITONEAL (Right) LAPAROSCOPIC INSERTION VENTRICULAR-PERITONEAL (V-P) SHUNT  Patient transferred from [facility] 7/19. Currently on ATC. Current TF regimen: Osmolite 1.2 formula at 50 ml/hr with Prostat liquid protein daily via PEG tube providing 1540 kcal, 81 grams protein, and 984 ml of free water. Free water flushes at 100 ml every 12 hours. Labs reviewed. CBG's [id number].  Diet Order:  Diet NPO time specified  Skin:  Reviewed, no issues  Last BM:  7/19  Height:   Ht Readings from Last 1 Encounters:  09/29/15 '5\' 3"'$  (1.6 m)    Weight: >>> stable  Wt Readings from Last 1 Encounters:  11/03/15 111 lb 8 oz (50.576 kg)    7/19  113 lb 7/19  105 lb 7/18  108 lb 7/17  105 lb 7/16  106 lb 7/15  104 lb 7/14  102 lb 7/13  102 lb 7/12  102 lb 7/11  101 lb  Ideal Body Weight:  52.2 kg  BMI:  Body mass index is 19.76 kg/(m^2).  Estimated Nutritional Needs:   Kcal:  0737-1062  Protein:  70-85 grams  Fluid:  > 1.5 L/day  EDUCATION NEEDS:   No education needs identified at this time  Arthur Holms, RD, LDN Pager #: [phone] After-Hours Pager #: [phone]

## 2015-11-03 NOTE — Progress Notes (Addendum)
Patient ID: Joanna Farmer, female   DOB: Jun 13, 1955, 60 y.o.   MRN: [id number]     PROGRESS NOTE    Joanna Farmer  [name]: [birth year] DOA: 09/29/2015  PCP: Rosita Fire, MD   Brief Narrative:  61 year old female past medical history of coronary artery disease, ischemic cardiomyopathy, and polysubstance abuse being admitted with right thalamic intracranial hemorrhage. Intubated for airway protection. Has failed extubation x 1, now with trach in place, tolerating 24x7 ATC. S/P ventric shunt placement 7/18.   Assessment & Plan: Right Thalamic Intracranial Hemorrhage in pt with known alcohol and cocaine abuse  - S/P IVC 6/16, shunt placement 7/18 - RASS goal  - pt still lethargic, difficult to arouse this AM  - PCCM d/w family poor prognosis for any meaningful recovery  - work in progress to d/c pt to SNF closer to family   Acute Hypoxic Respiratory Failure  - Unable to protect airway secondary to [place] - Suspected HCAP - S/P Tx - Continue trach collar as tol  - Intermittent CXR as needed  Fever overnight 7/19 - Tmax 100.8 F, ABX have been already discontinued per PCCM and neurosurgery  - will need to monitor closely as I am worried for developing another infection - HR has been up to 100.7 overnight and HR > 22 which by criteria pt actually has SIRS - etiology at this time is unclear and suspect related to underlying ICH - will monitor is SDU   Chronic Systolic CHF - EF [id number] w/ grade 1 diastolic dysfunction. RV normal - no signs of volume overload - weight stable at 50 kg - monitor  Essential HTN - reasonable inpatient control  - SBP goal < 160  - continue Norvasc 10 mg QD, Carvedilol 12.5 mg BID, Hydralazine PRN  H/O CAD - S/P PCI in 2015  Hyponatremia and hypernatremia since admission  - resolved   Metabolic Acidosis - resolved   Diarrhea - C. Diff negative - resolved   Anemia of acute illness, thalamic ICH - No signs of active bleeding  currently. 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 H/O HCAP - S/P Tx. - Empiric Vancomycin & Rocephin completed for 5 days as per Neurosurgery secondary to indwelling drain - if persistent fevers, needs repeat blood cultures and drain cultures   DVT prophylaxis: Heparin SQ Code Status: Full  Family Communication: no family at bedside  Disposition Plan: SNF when bed available   Consultants:   Neurosurgery   Procedures:   Shunt 7/18  Antimicrobials:   Completed ABX therapy    Subjective: Tmax overnight 1008 F, no other events.   Objective: Filed Vitals:   11/03/15 1117 11/03/15 1200 11/03/15 1236 11/03/15 1300  BP:   148/72 136/65  Pulse: 100 97 92 100  Temp:   100.6 F (38.1 C)   TempSrc:   Axillary   Resp: 26 25  26   Height:      Weight:      SpO2: 96% 95%  97%    Intake/Output Summary (Last 24 hours) at 11/03/15 1410 Last data filed at 11/03/15 1200  Gross per 24 hour  Intake   2245 ml  Output    950 ml  Net   1295 ml   Filed Weights   11/02/15 0500 11/02/15 1618 11/03/15 0500  Weight: 47.7 kg (105 lb 2.6 oz) 51.529 kg (113 lb 9.6 oz) 50.576 kg (111 lb 8 oz)    Examination:  General exam: Appears clethargic Respiratory system: Respiratory effort normal. Diminished breath sounds at bases  Cardiovascular system: S1 & S2 heard, RRR. No JVD, rubs, gallops or clicks. No pedal edema. Gastrointestinal system: Abdomen is nondistended, soft and nontender. No organomegaly or masses felt.   Data Reviewed: I have personally reviewed following labs and imaging studies  CBC:  Recent Labs Lab 10/31/15 0333 11/02/15 0427 11/03/15 0232  WBC 7.0 7.0 6.9  HGB 8.4* 8.4* 8.6*  HCT 26.0* 25.4* 26.1*  MCV 95.6 94.1 94.6  PLT 277 276 [id number]   Basic Metabolic Panel:  Recent Labs Lab 10/29/15 0314 10/31/15 0333 11/02/15 0427 11/03/15 0232  NA 138 134* 136 136  K 4.1 4.1 4.5 4.6  CL 102 101 102 106  CO2 27  25 25 22   GLUCOSE 181* 190* 186* 139*  BUN 22* 16 14 18   CREATININE 0.70 0.49 0.57 0.49  CALCIUM 10.0 9.7 9.4 9.7   CBG:  Recent Labs Lab 11/02/15 2113 11/03/15 0107 11/03/15 0508 11/03/15 0813 11/03/15 1347  GLUCAP 119* 124* 126* 160* 113*   Urine analysis:    Component Value Date/Time   COLORURINE YELLOW 10/04/2015 [place] 10/04/2015 1704   LABSPEC 1.016 10/04/2015 1704   PHURINE 7.0 10/04/2015 1704   GLUCOSEU NEGATIVE 10/04/2015 1704   HGBUR NEGATIVE 10/04/2015 1704   BILIRUBINUR NEGATIVE 10/04/2015 1704   KETONESUR NEGATIVE 10/04/2015 1704   PROTEINUR NEGATIVE 10/04/2015 1704   UROBILINOGEN 0.2 12/30/2013 1338   NITRITE NEGATIVE 10/04/2015 1704   LEUKOCYTESUR NEGATIVE 10/04/2015 1704    Recent Results (from the past 240 hour(s))  CSF culture with Stat gram stain     Status: None   Collection Time: 10/29/15  8:43 AM  Result Value Ref Range Status   Specimen Description CSF  Final   Special Requests FROM VENTRICULAR SHUNT  Final   Gram Stain   Final    WBC PRESENT, PREDOMINANTLY MONONUCLEAR NO ORGANISMS SEEN CYTOSPIN SMEAR    Culture NO GROWTH 3 DAYS  Final   Report Status 11/01/2015 FINAL  Final      Radiology Studies: Ct Head Wo Contrast  11/02/2015  CLINICAL DATA:  Follow-up exam status post EXAM: CT HEAD WITHOUT CONTRAST TECHNIQUE: Contiguous axial images were obtained from the base of the skull through the vertex without intravenous contrast. COMPARISON:  Prior CT from 11/01/2015. FINDINGS: Postoperative changes from interval VP shunt internalization are present. Skin staples at the right frontal scalp. Soft tissue emphysema about the reservoir along the shunt catheter within the right scalp and neck. Scalp soft tissues otherwise within normal limits. No acute abnormality about the globes and orbits. Visualized paranasal sinuses are largely clear. Small left mastoid effusion, stable. Right mastoid air cells clear. Right frontal approach  shunt catheter in place with tip at the foramen of Monro. Hypodensity along the catheter tract within the right frontal lobe is similar. Overall, lateral and third ventricular dilatation appears slightly improved from previous. Persistent hypodensity about the ventricles likely reflects persistent transependymal flow of CSF. Degenerating hemorrhage centered at the right thalamus again seen, stable. Remote lacunar infarct at the right basal ganglia also noted. No new intracranial hemorrhage. No extra-axial fluid collection. No acute infarct. IMPRESSION: 1. Sequela of interval VP shunt internalization. Right frontal approach shunt catheter tip terminates near the foramen of Monro. Lateral and third ventricular dilatation slightly improved relative to 11/01/2015. 2. Continued interval evolution of right thalamic hemorrhage. Electronically Signed   By: Pincus Badder.D.  On: 11/02/2015 06:35      Scheduled Meds: . amLODipine  10 mg Per Tube QHS  . antiseptic oral rinse  7 mL Mouth Rinse q12n4p  . carvedilol  12.5 mg Oral BID WC  . chlorhexidine  15 mL Mouth Rinse BID  . docusate  100 mg Oral BID  . famotidine  20 mg Per Tube QHS  . feeding supplement (PRO-STAT SUGAR FREE 64)  30 mL Per Tube Daily  . free water  100 mL Per Tube Q12H  . heparin subcutaneous  5,000 Units Subcutaneous Q12H  . insulin aspart  0-9 Units Subcutaneous Q4H  . levETIRAcetam  500 mg Intravenous BID  . multivitamin  15 mL Per Tube Daily  . sennosides  5 mL Oral Daily   Continuous Infusions: . feeding supplement (OSMOLITE 1.2 CAL) 1,000 mL (11/03/15 1200)     LOS: 35 days    Time spent: 20 minutes    Faye Ramsay, MD [facility] Pager [phone]  If 7PM-7AM, please contact night-coverage [email or web address] Password [email or web address] 11/03/2015, 2:10 PM

## 2015-11-04 DIAGNOSIS — Z930 Tracheostomy status: Secondary | ICD-10-CM | POA: Insufficient documentation

## 2015-11-04 DIAGNOSIS — R2990 Unspecified symptoms and signs involving the nervous system: Secondary | ICD-10-CM

## 2015-11-04 LAB — BASIC METABOLIC PANEL
ANION GAP: 7 (ref 5–15)
BUN: 20 mg/dL (ref 6–20)
CALCIUM: 10 mg/dL (ref 8.9–10.3)
CO2: 24 mmol/L (ref 22–32)
CREATININE: 0.49 mg/dL (ref 0.44–1.00)
Chloride: 104 mmol/L (ref 101–111)
Glucose, Bld: 150 mg/dL — ABNORMAL HIGH (ref 65–99)
Potassium: 4.3 mmol/L (ref 3.5–5.1)
Sodium: 135 mmol/L (ref 135–145)

## 2015-11-04 LAB — CBC
HCT: 28.4 % — ABNORMAL LOW (ref 36.0–46.0)
Hemoglobin: 9.2 g/dL — ABNORMAL LOW (ref 12.0–15.0)
MCH: 30.7 pg (ref 26.0–34.0)
MCHC: 32.4 g/dL (ref 30.0–36.0)
MCV: 94.7 fL (ref 78.0–100.0)
PLATELETS: 362 10*3/uL (ref 150–400)
RBC: 3 MIL/uL — ABNORMAL LOW (ref 3.87–5.11)
RDW: 13.9 % (ref 11.5–15.5)
WBC: 6.5 10*3/uL (ref 4.0–10.5)

## 2015-11-04 LAB — GLUCOSE, CAPILLARY
GLUCOSE-CAPILLARY: 165 mg/dL — AB (ref 65–99)
GLUCOSE-CAPILLARY: 109 mg/dL — AB (ref 65–99)
Glucose-Capillary: 126 mg/dL — ABNORMAL HIGH (ref 65–99)
Glucose-Capillary: 160 mg/dL — ABNORMAL HIGH (ref 65–99)
Glucose-Capillary: 119 mg/dL — ABNORMAL HIGH (ref 65–99)

## 2015-11-04 MED ORDER — BISACODYL 10 MG RE SUPP: 10.0000 mg | Freq: Every day | RECTAL | Status: AC | PRN

## 2015-11-04 MED ORDER — AMLODIPINE BESYLATE 10 MG PO TABS: 10.0000 mg | ORAL_TABLET | Freq: Every day | ORAL | Status: AC

## 2015-11-04 MED ORDER — FAMOTIDINE 40 MG/5ML PO SUSR: 20.0000 mg | Freq: Every day | ORAL | Status: AC

## 2015-11-04 MED ORDER — LEVETIRACETAM 100 MG/ML PO SOLN: 500.0000 mg | Freq: Two times a day (BID) | ORAL | Status: AC

## 2015-11-04 MED ORDER — DOCUSATE SODIUM 50 MG/5ML PO LIQD: 100.0000 mg | Freq: Two times a day (BID) | ORAL | Status: AC

## 2015-11-04 MED ORDER — ACETAMINOPHEN 160 MG/5ML PO SOLN: 650.0000 mg | ORAL | Status: AC | PRN

## 2015-11-04 NOTE — Progress Notes (Signed)
CSW working on potential DC for today to [employer] review requesting additional clinicals- clinicals were sent and patient was made Level II review which requires in person evaluation- this will NOT happen over the weekend- pt can not DC to SNF until PASAR received- MD updated  CSW will continue to follow  Domenica Reamer, [place] Worker [phone]

## 2015-11-04 NOTE — Progress Notes (Signed)
Per Salvadore Dom, ACNP-BC- patient tracheostomy tested after change and EZ-CAP positive color change for CO2 presence.  Milford Cage, RN

## 2015-11-04 NOTE — Progress Notes (Signed)
CSW working on placement with family- would have a bed somewhere today if ready  Pt needs trach changed to cuffless prior to DC to SNF- CSW paged MD to inform.  CSW will continue to follow  Domenica Reamer, [place] Social Worker [phone]

## 2015-11-04 NOTE — Procedures (Signed)
Tracheostomy tube change: Informed verbal consent was obtained after explaining the risks (including bleeding and infection), benefits and alternatives of the procedure. Verbal timeout was performed prior to the procedure. The old  #6 cuffed trach was carefully removed. the tracheostomy site appeared: well healed w/ dried secretions around stoma. A new #  6 Cuffless trach was easily placed in the tracheostomy stoma and secured with velcro trach ties. The tracheostomy was patent, good color change observed via EZ-CAP, and the patient was easily able to voice with finger occlusion and tolerated the procedure well with no immediate complications.    Erick Colace ACNP-BC [place] Pager # [phone] OR # [phone] if no answer

## 2015-11-04 NOTE — Discharge Summary (Addendum)
Physician Discharge Summary  Joanna Farmer [name]: [birth year] DOA: 09/29/2015  PCP: Rosita Fire, MD  Admit date: 09/29/2015 Discharge date: 11/04/2015  Recommendations for Outpatient Follow-up:  1. Pt will need to follow up with PCP in 1-2 eeks post discharge 2. Please obtain BMP to evaluate electrolytes and kidney function 3. Please also check CBC to evaluate Hg and Hct levels   Please note that pt was not discharged on 11/04/2015, no bed available.   Discharge Diagnoses:  Active Problems:   ICH (intracerebral hemorrhage) (HCC)   Acute respiratory failure (HCC)   Cytotoxic brain edema (HCC)   Obstructive hydrocephalus   Brain herniation ([place])   HCAP (healthcare-associated pneumonia)   Discharge Condition: Stable  Diet recommendation: as tolerated   History of present illness:   Brief Narrative:  59 year old female past medical history of coronary artery disease, ischemic cardiomyopathy, and polysubstance abuse being admitted with right thalamic intracranial hemorrhage. Intubated for airway protection. Has failed extubation x 1, now with trach in place, tolerating 24x7 ATC. S/P ventric shunt placement 7/18.   Assessment & Plan: Right Thalamic Intracranial Hemorrhage in pt with known alcohol and cocaine abuse  - S/P IVC 6/16, shunt placement 7/18 - RASS goal  - PCCM d/w family poor prognosis for any meaningful recovery  - work plan to d/c to SNF  Acute Hypoxic Respiratory Failure  - Unable to protect airway secondary to [place] - Suspected HCAP - S/P Tx - Continue trach collar as tol   Fever overnight 7/19 - Tmax 100.8 F, ABX have been already discontinued per PCCM and neurosurgery  - will need to monitor closely as I am worried for developing another infection - no fevers since, WBC stable  - no clear infectious etiology at this time   Chronic Systolic CHF - EF [id number] w/ grade 1 diastolic dysfunction. RV normal - no signs of volume overload - weight  stable at 50 kg - monitor  Essential HTN - reasonable inpatient control  - SBP goal < 160   H/O CAD - S/P PCI in 2015  Hyponatremia and hypernatremia since admission  - resolved   Metabolic Acidosis - resolved   Diarrhea - C. Diff negative - resolved   Anemia of acute illness, thalamic ICH - No signs of active bleeding currently.  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 H/O HCAP - S/P Tx. - Empiric Vancomycin & Rocephin completed for 5 days as per Neurosurgery secondary to indwelling drain  Code Status: Full  Family Communication: called son over the phone  Disposition Plan: SNF  Consultants:   Neurosurgery  Procedures:   Shunt 7/18  Antimicrobials:   Completed ABX therapy  Procedures/Studies: Ct Head Wo Contrast  11/02/2015  CLINICAL DATA:  Follow-up exam status post EXAM: CT HEAD WITHOUT CONTRAST TECHNIQUE: Contiguous axial images were obtained from the base of the skull through the vertex without intravenous contrast. COMPARISON:  Prior CT from 11/01/2015. FINDINGS: Postoperative changes from interval VP shunt internalization are present. Skin staples at the right frontal scalp. Soft tissue emphysema about the reservoir along the shunt catheter within the right scalp and neck. Scalp soft tissues otherwise within normal limits. No acute abnormality about the globes and orbits. Visualized paranasal sinuses are largely clear. Small left mastoid effusion, stable. Right mastoid air cells clear. Right frontal approach shunt catheter in place with tip at the foramen of Monro. Hypodensity along the catheter tract within the right frontal lobe is similar. Overall, lateral and third ventricular dilatation  appears slightly improved from previous. Persistent hypodensity about the ventricles likely reflects persistent transependymal flow of CSF. Degenerating hemorrhage centered at the right thalamus again seen, stable. Remote lacunar infarct at the right basal  ganglia also noted. No new intracranial hemorrhage. No extra-axial fluid collection. No acute infarct. IMPRESSION: 1. Sequela of interval VP shunt internalization. Right frontal approach shunt catheter tip terminates near the foramen of Monro. Lateral and third ventricular dilatation slightly improved relative to 11/01/2015. 2. Continued interval evolution of right thalamic hemorrhage. Electronically Signed   By: Jeannine Boga M.D.   On: 11/02/2015 06:35   Ct Head Wo Contrast  11/01/2015  CLINICAL DATA:  Followup intracranial hemorrhage and intraventricular external drainage catheter. EXAM: CT HEAD WITHOUT CONTRAST TECHNIQUE: Contiguous axial images were obtained from the base of the skull through the vertex without intravenous contrast. COMPARISON:  10/19/2015 FINDINGS: Previously seen right thalamic hemorrhage continues to become less dense. No evidence of additional acute bleeding. Ventriculostomy placed from a right frontal approach enters the frontal horn of the right lateral ventricle in has its tip in apposition to the anterior inferior septum pellucidum. There continues to be hydrocephalus of the lateral and third ventricles an this is probably slightly worsened. There is more low-density around the ventricles likely due to transependymal resorption. No extra-axial collection.  No primary bone finding. IMPRESSION: Possible ventricular drain malfunction. Slight further increase in size of the lateral and third ventricles, probably with transependymal resorption of CSF. Expected evolutionary changes of the right thalamic hemorrhage, becoming less dense. Electronically Signed   By: Nelson Chimes M.D.   On: 11/01/2015 11:04   Ct Head Wo Contrast  10/19/2015  CLINICAL DATA:  60 year old female with intracranial hemorrhage status post external ventricular drain placement. Ventriculomegaly. Initial encounter. EXAM: CT HEAD WITHOUT CONTRAST TECHNIQUE: Contiguous axial images were obtained from the base of  the skull through the vertex without intravenous contrast. COMPARISON:  10/13/2015 and earlier. FINDINGS: Stable visualized osseous structures. Paranasal sinuses and mastoids are stable and well pneumatized. No acute orbit or scalp soft tissue finding. Right frontal approach external ventricular drain re- demonstrated. The intracranial catheter tip abuts the septum pellucidum and appears to communicate with the right lateral ventricle as seen on series 207, image 28. Significant generalized ventricular enlargement since 10/13/2015, relatively sparing the fourth ventricle. Expected evolution of the 3-4 cm hyperdense central brain hemorrhage centered at the right cerebral peduncle. Hyperdense hemorrhage there has decreased to 35 x 28 mm versus 39 x 33 mm previously. Previously-seen small volume lateral intraventricular hemorrhage appears resolved. No intraventricular or extra-axial hemorrhage identified. No new intracranial hemorrhage. Hypodense edema surrounding the resolving parenchymal hematoma has not significantly changed. Stable gray-white matter differentiation elsewhere. No cortically based acute infarct identified. IMPRESSION: 1. Interval ventriculomegaly relatively sparing the fourth ventricle, while the right frontal approach EVD positioning appears stable. Query EVD malfunction. 2. Expected evolution of the hyperdense hemorrhage centered at the right cerebral peduncle. Small volume of intraventricular hemorrhage has resolved. Electronically Signed   By: Genevie Ann M.D.   On: 10/19/2015 11:52   Ct Head Wo Contrast  10/13/2015  CLINICAL DATA:  Intracranial hemorrhage follow-up EXAM: CT HEAD WITHOUT CONTRAST TECHNIQUE: Contiguous axial images were obtained from the base of the skull through the vertex without intravenous contrast. COMPARISON:  10/10/2015 FINDINGS: Hemorrhage in the right thalamus and basal ganglia unchanged measuring 33 x 39 mm. Hypodensity edema surrounds the hematoma. Hematoma displaces the  midline to the left unchanged. Right frontal ventricular catheter tip in the  right frontal horn unchanged. Decreased intraventricular hemorrhage. Decreased ventricular size since the prior study. No acute infarct.  No evidence of new hemorrhage since prior study. IMPRESSION: Right thalamic hematoma unchanged. Decreased intraventricular hemorrhage and decreased ventricular size since the prior CT. Electronically Signed   By: Franchot Gallo M.D.   On: 10/13/2015 07:05   Ct Head Wo Contrast  10/10/2015  CLINICAL DATA:  Follow-up evaluation.  Thalamus hemorrhage. EXAM: CT HEAD WITHOUT CONTRAST TECHNIQUE: Contiguous axial images were obtained from the base of the skull through the vertex without intravenous contrast. COMPARISON:  CT HEAD October 09, 2015 FINDINGS: INTRACRANIAL CONTENTS: RIGHT thalamus hematoma was 3.7 x 2.8 cm, now 4 x 3.3 cm with worsening mass-effect, surrounding low-density vasogenic edema. Again noted is intraventricular extension of hemorrhage. RIGHT frontal ventriculostomy catheter in place with distal tip at the foramen of Monro. Scratch Mild hydrocephalus, improved from prior CT without residual ventricular entrapment. Patchy white matter hypodensities exclusive of the aforementioned abnormality most compatible with chronic small vessel ischemic disease. Mildly effaced basal cistern. No acute large vascular territory infarct. ORBITS: The included ocular globes and orbital contents are normal. SINUSES: The mastoid aircells and included paranasal sinuses are well-aerated. SKULL/SOFT TISSUES: No skull fracture. No significant soft tissue swelling. RIGHT nasogastric tube. IMPRESSION: Enlarging RIGHT thalamus hematoma with increasing local mass effect. Resolving LEFT ventricular entrapment with RIGHT frontal ventriculostomy catheter in place. Mild residual hydrocephalus. These results will be called to the ordering clinician or representative by the Radiologist Assistant, and communication documented  in the PACS or zVision Dashboard. Electronically Signed   By: Elon Alas M.D.   On: 10/10/2015 05:21   Ct Head Wo Contrast  10/09/2015  CLINICAL DATA:  Altered mental status 09/29/2015, fall on 09/28/2015, hypertension, abnormal neurologic exam today at 1400 hours, history coronary disease post MI and coronary PTCA, smoker EXAM: CT HEAD WITHOUT CONTRAST TECHNIQUE: Contiguous axial images were obtained from the base of the skull through the vertex without intravenous contrast. COMPARISON:  None. FINDINGS: Intraventricular shunt via RIGHT frontal approach with tip at the LEFT foramina [place]. Decompressed RIGHT lateral ventricle with increase in dilatation of the LEFT lateral ventricle since the previous exam. Again identified RIGHT thalamic hemorrhage measuring 3.7 x 2.8 x 2.5 cm, previously 2.8 x 2.4 x 1.6 cm with slightly greater mass effect on the midline. Increased intraventricular hemorrhage since previous exam within lateral and third ventricles. No new areas of hemorrhage, infarction, or mass. No extra-axial fluid collection. Bones and sinuses unremarkable. IMPRESSION: Increase in size of RIGHT thalamic hemorrhage since previous study with slightly increased intraventricular hemorrhage and mass effect upon the midline at the level of the thalamus. Findings called to [facility] on [place] on 10/09/2015 at 1543 hours. Electronically Signed   By: Lavonia Dana M.D.   On: 10/09/2015 15:41   Ct Head Wo Contrast  10/06/2015  CLINICAL DATA:  Follow-up examination for intracranial hemorrhage. EXAM: CT HEAD WITHOUT CONTRAST TECHNIQUE: Contiguous axial images were obtained from the base of the skull through the vertex without intravenous contrast. COMPARISON:  Prior CT from 10/04/2015. FINDINGS: Continued interval evolution of parenchymal hemorrhage centered at the right thalamus, measuring 2.8 x 2.4 x 1.4 cm on today's study, not significantly changed. Hematoma itself appears to be slightly less dense.  Similar localized mass effect on the adjacent third ventricle. Intraventricular extension with degenerating blood products within the occipital horns of both lateral ventricles. Small amount of blood present within the third ventricle as well. Right frontal approach  ventricular catheter in stable position with tip at the foramen of Monro. Similar edema along the catheter tract. Ventricle size is stable without hydrocephalus. No new intracranial hemorrhage. No other acute large vessel territory infarct. No extra-axial fluid collection. Skin staples remain at the right frontal scalp. Scalp soft tissues otherwise unremarkable. No acute abnormality about the globes and orbits. Mild mucosal thickening within the left sphenoid sinus. Visualized paranasal sinuses are otherwise clear. No mastoid effusion. Calvarium unchanged. IMPRESSION: 1. Continued interval evolution of right thalamic parenchymal hematoma with similar localized mass effect and edema. 2. Stable position of right frontal approach ventricular catheter. Stable ventricular size with small volume degenerating intraventricular hemorrhage. Electronically Signed   By: Jeannine Boga M.D.   On: 10/06/2015 05:26   Dg Chest Port 1 View  10/21/2015  CLINICAL DATA:  Respiratory failure, new tracheostomy tube EXAM: PORTABLE CHEST 1 VIEW COMPARISON:  10/21/2015 FINDINGS: Cardiac shadow is within normal limits. The lungs are well aerated. A feeding catheter is again identified. New tracheostomy tube is noted in satisfactory position. Patchy changes in the right base are noted slightly increased from the prior exam. IMPRESSION: Slight increase in degree of right basilar atelectasis. New tracheostomy tube. Electronically Signed   By: Inez Catalina M.D.   On: 10/21/2015 11:05   Dg Chest Port 1 View  10/21/2015  CLINICAL DATA:  Intubated patient, acute respiratory failure, pneumonia, intracranial hemorrhage with cerebral edema EXAM: PORTABLE CHEST 1 VIEW COMPARISON:   Portable chest x-ray of October 20, 2015 FINDINGS: The lungs are slightly less well inflated today. Bibasilar interstitial density is present. There is no significant pleural effusion. The heart is top-normal in size. The pulmonary vascularity is normal. The endotracheal tube tip lies 5.2 cm above the carina. The feeding tube tip projects over the distal stomach or proximal duodenum. IMPRESSION: Bibasilar subsegmental atelectasis greater on the left. No pleural effusion or pulmonary edema. The support tubes are in stable position. Electronically Signed   By: David  Martinique M.D.   On: 10/21/2015 07:25   Dg Chest Port 1 View  10/20/2015  CLINICAL DATA:  Ischemic cardiomyopathy, suspect acute MI, known cardiac steps, respiratory failure and pneumonia, acute intracranial hemorrhage EXAM: PORTABLE CHEST 1 VIEW COMPARISON:  Portable chest x-ray of October 18, 2015 FINDINGS: The lungs are well-expanded. Infiltrates at both lung bases are less conspicuous. There is no significant pleural effusion and no pneumothorax. The heart is top-normal in size. The pulmonary vascularity is normal. There is calcification in the wall of the aortic arch. The endotracheal tube tip lies 4.7 cm above the carina. The feeding tube tip projects below the inferior margin of the image with multiple overlapping coils in the stomach. IMPRESSION: Slight interval improvement in the appearance of the lungs with decreased atelectasis or infiltrate at the bases. No CHF. Aortic atherosclerosis. Electronically Signed   By: David  Martinique M.D.   On: 10/20/2015 07:08   Dg Chest Port 1 View  10/18/2015  CLINICAL DATA:  60 year old female with a history of adjusted endotracheal tube EXAM: PORTABLE CHEST 1 VIEW COMPARISON:  10/18/2015 FINDINGS: Cardiomediastinal silhouette unchanged. Calcifications of the aortic arch. Vascular stent projects adjacent to the left heart border. Enteric feeding tube projects over the mediastinum and terminates at the midline overlying  the spine. Endotracheal tube has been advanced in the interval, suitably position above the carina, approximately 4.4 cm. Mixed interstitial and airspace opacities at the bilateral lung bases with decreased lung volumes compared to the prior. No large pleural effusion or  pneumothorax. IMPRESSION: Interval advanced endotracheal tube, which now terminates suitably above the carina approximately 4.4 cm. Decreased lung volumes with bibasilar opacities, potentially atelectasis. Unchanged position of the feeding tube, which terminates in the abdomen overlying the midline. Aortic atherosclerosis.  Evidence of prior PTCI. Signed, Dulcy Fanny. Earleen Newport, DO [facility] Electronically Signed   By: Corrie Mckusick D.O.   On: 10/18/2015 11:57   Dg Chest Port 1 View  10/18/2015  CLINICAL DATA:  Intubated EXAM: PORTABLE CHEST 1 VIEW COMPARISON:  10/17/2015 FINDINGS: Endotracheal tube has been retracted and is now 8.5 cm above the carina near the thoracic inlet. Heart is normal size. There is bibasilar atelectasis. No effusions. No acute bony abnormality. IMPRESSION: Retraction of the endotracheal tube, now 8.5 cm above the carina. Bibasilar atelectasis. Electronically Signed   By: Rolm Baptise M.D.   On: 10/18/2015 07:59   Dg Chest Port 1 View  10/17/2015  CLINICAL DATA:  Intubated patient EXAM: PORTABLE CHEST 1 VIEW COMPARISON:  None in PACs FINDINGS: The lungs are adequately inflated. There is subtle increased alveolar density just above the right lateral costophrenic angle. The cardiac silhouette is normal in size. The pulmonary vascularity is not engorged. There is calcification in the wall of the aortic arch. The endotracheal tube tip lies 3.8 cm above the carina. The feeding tube tip lies in the region of the pylorus or proximal duodenum. There is coiling of much of the feeding tube in the stomach which may place the patient at risk for knotting of the tube. IMPRESSION:  Right basilar atelectasis or subtle infiltrate. Otherwise no acute cardiopulmonary abnormality. The support tubes are in reasonable position. See the discussion above regarding the coiling of the feeding tube within the stomach. There is aortic atherosclerosis. Electronically Signed   By: David  Martinique M.D.   On: 10/17/2015 07:32   Dg Chest Port 1 View  10/15/2015  CLINICAL DATA:  End of battery life of intrathecal infusion pump. EXAM: PORTABLE CHEST 1 VIEW COMPARISON:  10/11/2015 chest radiograph. FINDINGS: Endotracheal tube tip is 3.9 cm above the carina. Enteric tube enters stomach with the tip not seen on this image. Stable cardiomediastinal silhouette with top-normal heart size. No pneumothorax. No pleural effusion. No pulmonary edema. Mild patchy bibasilar lung opacities appear decreased. IMPRESSION: 1. Support structures as described . 2. Improved lung volumes with decreased mild patchy bibasilar lung opacities, favor atelectasis. Electronically Signed   By: Ilona Sorrel M.D.   On: 10/15/2015 11:32   Dg Chest Port 1 View  10/11/2015  CLINICAL DATA:  Endotracheal tube EXAM: PORTABLE CHEST 1 VIEW COMPARISON:  Yesterday FINDINGS: Feeding tube at least reaches the stomach, with coil seen in the fundus. Low endotracheal tube with tip 17 mm above the carina. At least segmental atelectasis at the right base. Borderline cardiomegaly, stable. No edema, effusion, or pneumothorax. IMPRESSION: 1. Low endotracheal tube, tip 17 mm above the carina. 2. Right basilar atelectasis similar to prior. Electronically Signed   By: Monte Fantasia M.D.   On: 10/11/2015 07:37   Dg Chest Port 1 View  10/10/2015  CLINICAL DATA:  Respiratory failure. EXAM: PORTABLE CHEST 1 VIEW COMPARISON:  10/09/2015 . FINDINGS: Endotracheal tube and feeding tube in stable position. Mediastinum hilar structures normal. Low lung volumes with mild bibasilar atelectasis and/or infiltrates again noted. Tiny bilateral pleural effusions cannot be  excluded. No pneumothorax. IMPRESSION: 1. Lines and tubes in stable position. 2. Mild bibasilar atelectasis and/or infiltrates. Small bilateral pleural effusions .  Electronically Signed   By: Marcello Moores  Register   On: 10/10/2015 07:30   Dg Chest Port 1 View  10/09/2015  CLINICAL DATA:  Intubated EXAM: PORTABLE CHEST 1 VIEW COMPARISON:  10/07/2015 FINDINGS: Cardiomediastinal silhouette is stable. Endotracheal tube with tip 1.2 cm above the carina. No infiltrate or pulmonary edema. There is NG feeding tube coiled within stomach with tip in pyloric region. No pneumothorax IMPRESSION: Endotracheal tube with tip 1.2 cm above the carina. No pneumothorax. NG feeding tube coiled within stomach with tip in distal stomach/pyloric region. Electronically Signed   By: Lahoma Crocker M.D.   On: 10/09/2015 17:33   Dg Chest Port 1 View  10/07/2015  CLINICAL DATA:  60 year old female with intracranial hemorrhage. Intubated. Initial encounter. EXAM: PORTABLE CHEST 1 VIEW COMPARISON:  10/05/2015 and earlier. FINDINGS: Portable AP semi upright view at 0836 hours. Stable endotracheal tube, tip between the level the clavicles and carina. Enteric tube courses to the abdomen, tip not included. Chronic left lung base hypo ventilation over this series of exams since 09/29/2015. Retrocardiac opacity has mildly progressed since 10/04/2015. There could be an associated small left pleural effusion. Minimal streaky opacity at the right base most resembles atelectasis. Mediastinal contours remain normal. Calcified aortic atherosclerosis. No pneumothorax or pulmonary edema. The upper lobes remain clear. IMPRESSION: 1.  Stable lines and tubes. 2. Mild progression of left lung base opacity which could reflect severe atelectasis but is suspicious for left lower lobe pneumonia. Possible associated small left pleural effusion. 3. Mild right lung base atelectasis. 4. Calcified aortic atherosclerosis. Electronically Signed   By: Genevie Ann M.D.   On:  10/07/2015 08:52   Dg Abd Portable 1v  10/21/2015  CLINICAL DATA:  Check feeding catheter placement EXAM: PORTABLE ABDOMEN - 1 VIEW COMPARISON:  10/08/2015 FINDINGS: Scattered large and small bowel gas is noted. A feeding catheter is noted coiled within the stomach with the tip directed towards the pyloric channel. This is similar in appearance to that seen on the prior exam. IMPRESSION: Feeding catheter in the distal stomach as described. Electronically Signed   By: Inez Catalina M.D.   On: 10/21/2015 11:04   Dg Abd Portable 1v  10/08/2015  CLINICAL DATA:  Nasogastric tube placement.  Initial encounter. EXAM: PORTABLE ABDOMEN - 1 VIEW COMPARISON:  None. FINDINGS: The patient's enteric tube is noted coiling within the stomach, ending overlying the pylorus. This will likely progress further with time. The visualized bowel gas pattern is unremarkable. Scattered air and stool filled loops of colon are seen; no abnormal dilatation of small bowel loops is seen to suggest small bowel obstruction. No free intra-abdominal air is identified, though evaluation for free air is limited on a single supine view. The visualized osseous structures are within normal limits; the sacroiliac joints are unremarkable in appearance. Mild left basilar opacity could reflect pneumonia, depending on the patient's symptoms. IMPRESSION: 1. Enteric tube noted coiling within the stomach, ending overlying the pylorus. This will likely progress further with time. 2. Mild left basilar airspace opacity could reflect pneumonia, depending on the patient's symptoms. 3. Unremarkable bowel gas pattern; no free intra-abdominal air seen. Electronically Signed   By: Garald Balding M.D.   On: 10/08/2015 19:16    Discharge Exam: Filed Vitals:   11/04/15 0800 11/04/15 1200  BP: 160/75 153/74  Pulse: 88 88  Temp: 98.3 F (36.8 C) 99.5 F (37.5 C)  Resp: 23 21   Filed Vitals:   11/04/15 0800 11/04/15 0900 11/04/15 1200 11/04/15 1201  BP: 160/75   153/74   Pulse: 88  88   Temp: 98.3 F (36.8 C)  99.5 F (37.5 C)   TempSrc: Oral  Axillary   Resp: 23  21   Height:      Weight:      SpO2: 96% 98% 97% 98%    General: Pt calm, NAD Cardiovascular: Regular rate and rhythm, S1/S2 +, no murmurs, no rubs, no gallops Respiratory: Clear to auscultation bilaterally, no wheezing, no crackles, no rhonchi Abdominal: Soft, non tender, non distended, bowel sounds +, no guarding   Discharge Instructions     Medication List    STOP taking these medications        aspirin 81 MG tablet     atorvastatin 80 MG tablet  Commonly known as:  LIPITOR     clopidogrel 75 MG tablet  Commonly known as:  PLAVIX     hydrochlorothiazide 12.5 MG tablet  Commonly known as:  HYDRODIURIL     losartan 50 MG tablet  Commonly known as:  COZAAR     nitroGLYCERIN 0.4 MG SL tablet  Commonly known as:  NITROSTAT     Potassium Chloride ER 20 MEQ Tbcr      TAKE these medications        acetaminophen 160 MG/5ML solution  Commonly known as:  TYLENOL  Place 20.3 mLs (650 mg total) into feeding tube every 4 (four) hours as needed for fever (Temp > 100.5 F).     amLODipine 10 MG tablet  Commonly known as:  NORVASC  Place 1 tablet (10 mg total) into feeding tube at bedtime.     bisacodyl 10 MG suppository  Commonly known as:  DULCOLAX  Place 1 suppository (10 mg total) rectally daily as needed for moderate constipation.     carvedilol 6.25 MG tablet  Commonly known as:  COREG  Take 1 tablet (6.25 mg total) by mouth 2 (two) times daily.     docusate 50 MG/5ML liquid  Commonly known as:  COLACE  Take 10 mLs (100 mg total) by mouth 2 (two) times daily.     famotidine 40 MG/5ML suspension  Commonly known as:  PEPCID  Place 2.5 mLs (20 mg total) into feeding tube at bedtime.     levETIRAcetam 100 MG/ML solution  Commonly known as:  KEPPRA  Take 5 mLs (500 mg total) by mouth 2 (two) times daily.            Follow-up Information    Follow  up with [facility], [place].   Specialty:  Internal Medicine   Contact information:   [place] [address] [phone]        The results of significant diagnostics from this hospitalization (including imaging, microbiology, ancillary and laboratory) are listed below for reference.     Microbiology: Recent Results (from the past 240 hour(s))  CSF culture with Stat gram stain     Status: None   Collection Time: 10/29/15  8:43 AM  Result Value Ref Range Status   Specimen Description CSF  Final   Special Requests FROM VENTRICULAR SHUNT  Final   Gram Stain   Final    WBC PRESENT, PREDOMINANTLY MONONUCLEAR NO ORGANISMS SEEN CYTOSPIN SMEAR    Culture NO GROWTH 3 DAYS  Final   Report Status 11/01/2015 FINAL  Final     Labs: Basic Metabolic Panel:  Recent Labs Lab 10/29/15 0314 10/31/15 0333 11/02/15 0427 11/03/15 0232 11/04/15 0525  NA 138 134* 136  136 135  K 4.1 4.1 4.5 4.6 4.3  CL 102 101 102 106 104  CO2 27 25 25 22 24   GLUCOSE 181* 190* 186* 139* 150*  BUN 22* 16 14 18 20   CREATININE 0.70 0.49 0.57 0.49 0.49  CALCIUM 10.0 9.7 9.4 9.7 10.0   CBC:  Recent Labs Lab 10/31/15 0333 11/02/15 0427 11/03/15 0232 11/04/15 0525  WBC 7.0 7.0 6.9 6.5  HGB 8.4* 8.4* 8.6* 9.2*  HCT 26.0* 25.4* 26.1* 28.4*  MCV 95.6 94.1 94.6 94.7  PLT 277 276 297 362   BNP (last 3 results)  Recent Labs  11/25/14 2155 04/07/15 0145  BNP 202.0* 100.0   CBG:  Recent Labs Lab 11/03/15 1815 11/03/15 2006 11/03/15 2340 11/04/15 0402 11/04/15 0857  GLUCAP 138* 108* 137* 126* 165*    SIGNED: Time coordinating discharge: 30 minutes  MAGICK-Kemonte Ullman, MD  [facility] 11/04/2015, 12:57 PM Pager [phone]  If 7PM-7AM, please contact night-coverage [email or web address] Password [email or web address]

## 2015-11-04 NOTE — Discharge Instructions (Signed)
Respiratory failure is when your lungs are not working well and your breathing (respiratory) system fails. When respiratory failure occurs, it is difficult for your lungs to get enough oxygen, get rid of carbon dioxide, or both. Respiratory failure can be life threatening.  °Respiratory failure can be acute or chronic. Acute respiratory failure is sudden, severe, and requires emergency medical treatment. Chronic respiratory failure is less severe, happens over time, and requires ongoing treatment.  °WHAT ARE THE CAUSES OF ACUTE RESPIRATORY FAILURE?  °Any problem affecting the heart or lungs can cause acute respiratory failure. Some of these causes include the following: °· Chronic bronchitis and emphysema (COPD).   °· Blood clot going to a lung (pulmonary embolism).   °· Having water in the lungs caused by heart failure, lung injury, or infection (pulmonary edema).   °· Collapsed lung (pneumothorax).   °· Pneumonia.   °· Pulmonary fibrosis.   °· Obesity.   °· Asthma.   °· Heart failure.   °· Any type of trauma to the chest that can make breathing difficult.   °· Nerve or muscle diseases making chest movements difficult. °HOW WILL MY ACUTE RESPIRATORY FAILURE BE TREATED?  °Treatment of acute respiratory failure depends on the cause of the respiratory failure. Usually, you will stay in the intensive care unit so your breathing can be watched closely. Treatment can include the following: °· Oxygen. Oxygen can be delivered through the following: °¨ Nasal cannula. This is small tubing that goes in your nose to give you oxygen. °¨ Face mask. A face mask covers your nose and mouth to give you oxygen. °· Medicine. Different medicines can be given to help with breathing. These can include: °¨ Nebulizers. Nebulizers deliver medicines to open the air passages (bronchodilators). These medicines help to open or relax the airways in the lungs so you can breathe better. They can also help loosen mucus from your  lungs. °¨ Diuretics. Diuretic medicines can help you breathe better by getting rid of extra water in your body. °¨ Steroids. Steroid medicines can help decrease swelling (inflammation) in your lungs. °¨ Antibiotics. °· Chest tube. If you have a collapsed lung (pneumothorax), a chest tube is placed to help reinflate the lung. °· Noninvasive positive pressure ventilation (NPPV). This is a tight-fitting mask that goes over your nose and mouth. The mask has tubing that is attached to a machine. The machine blows air into the tubing, which helps to keep the tiny air sacs (alveoli) in your lungs open. This machine allows you to breathe on your own. °· Ventilator. A ventilator is a breathing machine. When on a ventilator, a breathing tube is put into the lungs. A ventilator is used when you can no longer breathe well enough on your own. You may have low oxygen levels or high carbon dioxide (CO2) levels in your blood. When you are on a ventilator, sedation and pain medicines are given to make you sleep so your lungs can heal. °SEEK IMMEDIATE MEDICAL CARE IF: °· You have shortness of breath (dyspnea) with or without activity. °· You have rapid breathing (tachypnea). °· You are wheezing. °· You are unable to say more than a few words without having to catch your breath. °· You find it very difficult to function normally. °· You have a fast heart rate. °· You have a bluish color to your finger or toe nail beds. °· You have confusion or drowsiness or both. °  °This information is not intended to replace advice given to you by your health care provider. Make sure you discuss   any questions you have with your health care provider. °  °Document Released: 04/07/2013 Document Revised: 12/22/2014 Document Reviewed: 04/07/2013 °Elsevier Interactive Patient Education ©2016 [employer] ° °

## 2015-11-05 LAB — GLUCOSE, CAPILLARY
GLUCOSE-CAPILLARY: 152 mg/dL — AB (ref 65–99)
GLUCOSE-CAPILLARY: 160 mg/dL — AB (ref 65–99)
Glucose-Capillary: 114 mg/dL — ABNORMAL HIGH (ref 65–99)
Glucose-Capillary: 124 mg/dL — ABNORMAL HIGH (ref 65–99)
Glucose-Capillary: 137 mg/dL — ABNORMAL HIGH (ref 65–99)
Glucose-Capillary: 138 mg/dL — ABNORMAL HIGH (ref 65–99)
Glucose-Capillary: 145 mg/dL — ABNORMAL HIGH (ref 65–99)

## 2015-11-05 NOTE — Progress Notes (Signed)
Patient ID: Joanna Farmer, female   DOB: 09-11-1955, 60 y.o.   MRN: [id number]     PROGRESS NOTE    Joanna Farmer  [name]: Nov 01, 1955 DOA: 09/29/2015  PCP: Rosita Fire, MD   Brief Narrative:  60 year old female past medical history of coronary artery disease, ischemic cardiomyopathy, and polysubstance abuse being admitted with right thalamic intracranial hemorrhage. Intubated for airway protection. Has failed extubation x 1, now with trach in place, tolerating 24x7 ATC. S/P ventric shunt placement 7/18.   Assessment & Plan: Right Thalamic Intracranial Hemorrhage in pt with known alcohol and cocaine abuse  - S/P IVC 6/16, shunt placement 7/18 - RASS goal ) - pt is alert but not following any commands and non verbal  - PCCM d/w family poor prognosis for any meaningful recovery  - work in progress to d/c pt to SNF closer to family   Acute Hypoxic Respiratory Failure  - Unable to protect airway secondary to Joanna Farmer - Suspected HCAP - S/P Tx - Continue trach collar as tol  - Intermittent CXR as needed  Fever overnight 7/19 - Tmax 100.8 F, ABX have been already discontinued per PCCM and neurosurgery  - will need to monitor closely as I am worried for developing another infection - no fevers since  - etiology at this time is unclear and suspect related to underlying ICH - OK to transfer out of the SDU   Chronic Systolic CHF - EF 123456 w/ grade 1 diastolic dysfunction. RV normal - no signs of volume overload - weight stable at 50 kg - monitor  Essential HTN - reasonable inpatient control  - SBP goal < 160  - continue Norvasc 10 mg QD, Carvedilol 12.5 mg BID, Hydralazine PRN  H/O CAD - S/P PCI in 2015  Hyponatremia and hypernatremia since admission  - resolved   Metabolic Acidosis - resolved   Diarrhea - C. Diff negative - resolved   Anemia of acute illness, thalamic ICH - No signs of active bleeding currently. 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 H/O HCAP - S/P Tx. - Empiric Vancomycin & Rocephin completed for 5 days as per Neurosurgery secondary to indwelling drain - if persistent fevers, needs repeat blood cultures and drain cultures   DVT prophylaxis: Heparin SQ Code Status: Full  Family Communication: no family at bedside, called son over the phone multiple times, left messages to call me back  Disposition Plan: SNF when bed available   Consultants:   Neurosurgery   Procedures:   Shunt 7/18  Antimicrobials:   Completed ABX therapy    Subjective: Tmax overnight 1008 F, no other events.   Objective: Filed Vitals:   11/05/15 0500 11/05/15 0700 11/05/15 0800 11/05/15 0805  BP: 125/56 127/60 123/57   Pulse: 78 86 81 83  Temp:   98.5 F (36.9 C)   TempSrc:   Axillary   Resp: 20 17 19 13   Height:      Weight:      SpO2: 96% 98% 99% 98%    Intake/Output Summary (Last 24 hours) at 11/05/15 1052 Last data filed at 11/05/15 0600  Gross per 24 hour  Intake   1605 ml  Output   1250 ml  Net    355 ml   Filed Weights   11/02/15 1618 11/03/15 0500 11/05/15 0351  Weight: 51.529 kg (113 lb 9.6 oz) 50.576 kg (111 lb 8 oz) 48.3 kg (106 lb 7.7  oz)    Examination:  General exam: Appears clethargic Respiratory system: Respiratory effort normal. Diminished breath sounds at bases  Cardiovascular system: S1 & S2 heard, RRR. No JVD, rubs, gallops or clicks. No pedal edema. Gastrointestinal system: Abdomen is nondistended, soft and nontender. No organomegaly or masses felt.   Data Reviewed: I have personally reviewed following labs and imaging studies  CBC:  Recent Labs Lab 10/31/15 0333 11/02/15 0427 11/03/15 0232 11/04/15 0525  WBC 7.0 7.0 6.9 6.5  HGB 8.4* 8.4* 8.6* 9.2*  HCT 26.0* 25.4* 26.1* 28.4*  MCV 95.6 94.1 94.6 94.7  PLT 277 276 297 [id number]   Basic Metabolic Panel:  Recent Labs Lab 10/31/15 0333 11/02/15 0427 11/03/15 0232  11/04/15 0525  NA 134* 136 136 135  K 4.1 4.5 4.6 4.3  CL 101 102 106 104  CO2 25 25 22 24   GLUCOSE 190* 186* 139* 150*  BUN 16 14 18 20   CREATININE 0.49 0.57 0.49 0.49  CALCIUM 9.7 9.4 9.7 10.0   CBG:  Recent Labs Lab 11/04/15 1659 11/04/15 2044 11/05/15 0031 11/05/15 0410 11/05/15 0836  GLUCAP 109* 119* 137* 114* 152*   Urine analysis:    Component Value Date/Time   COLORURINE YELLOW 10/04/2015 [place] 10/04/2015 1704   LABSPEC 1.016 10/04/2015 1704   PHURINE 7.0 10/04/2015 1704   GLUCOSEU NEGATIVE 10/04/2015 1704   HGBUR NEGATIVE 10/04/2015 1704   BILIRUBINUR NEGATIVE 10/04/2015 1704   KETONESUR NEGATIVE 10/04/2015 1704   PROTEINUR NEGATIVE 10/04/2015 1704   UROBILINOGEN 0.2 12/30/2013 1338   NITRITE NEGATIVE 10/04/2015 1704   LEUKOCYTESUR NEGATIVE 10/04/2015 1704    Recent Results (from the past 240 hour(s))  CSF culture with Stat gram stain     Status: None   Collection Time: 10/29/15  8:43 AM  Result Value Ref Range Status   Specimen Description CSF  Final   Special Requests FROM VENTRICULAR SHUNT  Final   Gram Stain   Final    WBC PRESENT, PREDOMINANTLY MONONUCLEAR NO ORGANISMS SEEN CYTOSPIN SMEAR    Culture NO GROWTH 3 DAYS  Final   Report Status 11/01/2015 FINAL  Final      Radiology Studies: No results found.    Scheduled Meds: . amLODipine  10 mg Per Tube QHS  . antiseptic oral rinse  7 mL Mouth Rinse q12n4p  . carvedilol  12.5 mg Oral BID WC  . chlorhexidine  15 mL Mouth Rinse BID  . docusate  100 mg Oral BID  . famotidine  20 mg Per Tube QHS  . feeding supplement (PRO-STAT SUGAR FREE 64)  30 mL Per Tube Daily  . free water  100 mL Per Tube Q12H  . heparin subcutaneous  5,000 Units Subcutaneous Q12H  . insulin aspart  0-9 Units Subcutaneous Q4H  . levETIRAcetam  500 mg Intravenous BID  . multivitamin  15 mL Per Tube Daily  . sennosides  5 mL Oral Daily   Continuous Infusions: . feeding supplement (OSMOLITE 1.2  CAL) 1,000 mL (11/04/15 1706)     LOS: 37 days    Time spent: 20 minutes    Faye Ramsay, MD [facility] Pager [phone]  If 7PM-7AM, please contact night-coverage [email or web address] Password [facility] 11/05/2015, 10:52 AM

## 2015-11-05 NOTE — Progress Notes (Signed)
Confirmed with md that telemetry has been discontinued.

## 2015-11-05 NOTE — Progress Notes (Signed)
Pt arrived to unit. Oxygen and suction set up. Telemetry initiated on this unit.

## 2015-11-05 NOTE — Progress Notes (Signed)
Report given to Faith, RN who will take care of the patient on 39M. Patient transferred via bed to 39M. Belongings sent with patient including clothing... Called son and daughter to notify them of patient transfer. CCMD notified patient would not be on telemetry on 39M.   Milford Cage, RN

## 2015-11-06 LAB — GLUCOSE, CAPILLARY
GLUCOSE-CAPILLARY: 146 mg/dL — AB (ref 65–99)
GLUCOSE-CAPILLARY: 125 mg/dL — AB (ref 65–99)
Glucose-Capillary: 154 mg/dL — ABNORMAL HIGH (ref 65–99)
Glucose-Capillary: 133 mg/dL — ABNORMAL HIGH (ref 65–99)
Glucose-Capillary: 129 mg/dL — ABNORMAL HIGH (ref 65–99)

## 2015-11-06 NOTE — Progress Notes (Addendum)
Patient ID: Joanna Farmer, female   DOB: 08/23/1955, 60 y.o.   MRN: [id number]     PROGRESS NOTE    DEIRA SEISS  [name]: 03-Aug-1955 DOA: 09/29/2015  PCP: Rosita Fire, MD   Brief Narrative:  60 year old female past medical history of coronary artery disease, ischemic cardiomyopathy, and polysubstance abuse being admitted with right thalamic intracranial hemorrhage. Intubated for airway protection. Has failed extubation x 1, now with trach in place, tolerating 24x7 ATC. S/P ventric shunt placement 7/18.   Assessment & Plan: Right Thalamic Intracranial Hemorrhage in pt with known alcohol and cocaine abuse  - S/P IVC 6/16, shunt placement 7/18 - RASS goal 0 - pt not following any commands and non verbal  - PCCM d/w family poor prognosis for any meaningful recovery  - work in progress to d/c pt to SNF closer to family   Acute Hypoxic Respiratory Failure  - Unable to protect airway secondary to [place] - Suspected HCAP - S/P Tx - Continue trach collar as tolerated   Fever overnight 7/19 - Tmax 100.8 F, ABX have been already discontinued per PCCM and neurosurgery  - will need to monitor closely as I am worried for developing another infection - no fevers since   Chronic Systolic CHF - EF 123456 w/ grade 1 diastolic dysfunction. RV normal - no signs of volume overload - weight down from 50 --. 48 kg - monitor  Essential HTN - reasonable inpatient control  - SBP goal < 160  - continue Norvasc 10 mg QD, Carvedilol 12.5 mg BID, Hydralazine PRN  H/O CAD - S/P PCI in 2015  Hyponatremia and hypernatremia since admission  - resolved   Metabolic Acidosis - resolved   Diarrhea - C. Diff negative - resolved   Anemia of acute illness, thalamic ICH - No signs of active bleeding currently. 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 H/O HCAP - S/P Tx. - Empiric Vancomycin & Rocephin completed for 5 days as per  Neurosurgery secondary to indwelling drain - if persistent fevers, needs repeat blood cultures and drain cultures   DVT prophylaxis: Heparin SQ Code Status: Full  Family Communication: no family at bedside, called son over the phone multiple times, left messages to call me back  Disposition Plan: SNF when bed available   Consultants:   Neurosurgery   Procedures:   Shunt 7/18  Antimicrobials:   Completed ABX therapy    Subjective: Tmax overnight 1008 F, no other events.   Objective: Vitals:   11/06/15 0410 11/06/15 0618 11/06/15 0830 11/06/15 1100  BP:  (!) 142/43  (!) 113/48  Pulse: 70 84 76 77  Resp: 18 19 18 15   Temp:  97.5 F (36.4 C)  98 F (36.7 C)  TempSrc:  Axillary  Axillary  SpO2: 99% 99% 97% 99%  Weight:      Height:        Intake/Output Summary (Last 24 hours) at 11/06/15 1150 Last data filed at 11/06/15 0927  Gross per 24 hour  Intake              270 ml  Output             1625 ml  Net            -1355 ml   Filed Weights   11/02/15 1618 11/03/15 0500 11/05/15 0351  Weight: 51.5 kg (113 lb 9.6 oz) 50.6 kg (111 lb 8 oz)  48.3 kg (106 lb 7.7 oz)    Examination:  General exam: Appears clethargic Respiratory system: Respiratory effort normal. Diminished breath sounds at bases  Cardiovascular system: S1 & S2 heard, RRR. No JVD, rubs, gallops or clicks. No pedal edema. Gastrointestinal system: Abdomen is nondistended, soft and nontender. No organomegaly or masses felt.   Data Reviewed: I have personally reviewed following labs and imaging studies  CBC:  Recent Labs Lab 10/31/15 0333 11/02/15 0427 11/03/15 0232 11/04/15 0525  WBC 7.0 7.0 6.9 6.5  HGB 8.4* 8.4* 8.6* 9.2*  HCT 26.0* 25.4* 26.1* 28.4*  MCV 95.6 94.1 94.6 94.7  PLT 277 276 297 [id number]   Basic Metabolic Panel:  Recent Labs Lab 10/31/15 0333 11/02/15 0427 11/03/15 0232 11/04/15 0525  NA 134* 136 136 135  K 4.1 4.5 4.6 4.3  CL 101 102 106 104  CO2 25 25 22 24   GLUCOSE  190* 186* 139* 150*  BUN 16 14 18 20   CREATININE 0.49 0.57 0.49 0.49  CALCIUM 9.7 9.4 9.7 10.0   CBG:  Recent Labs Lab 11/05/15 1707 11/05/15 2003 11/05/15 2353 11/06/15 0356 11/06/15 0803  GLUCAP 124* 138* 145* 154* 146*   Urine analysis:    Component Value Date/Time   COLORURINE YELLOW 10/04/2015 [place] 10/04/2015 1704   LABSPEC 1.016 10/04/2015 1704   PHURINE 7.0 10/04/2015 1704   GLUCOSEU NEGATIVE 10/04/2015 1704   HGBUR NEGATIVE 10/04/2015 1704   BILIRUBINUR NEGATIVE 10/04/2015 1704   KETONESUR NEGATIVE 10/04/2015 1704   PROTEINUR NEGATIVE 10/04/2015 1704   UROBILINOGEN 0.2 12/30/2013 1338   NITRITE NEGATIVE 10/04/2015 1704   LEUKOCYTESUR NEGATIVE 10/04/2015 1704    Recent Results (from the past 240 hour(s))  CSF culture with Stat gram stain     Status: None   Collection Time: 10/29/15  8:43 AM  Result Value Ref Range Status   Specimen Description CSF  Final   Special Requests FROM VENTRICULAR SHUNT  Final   Gram Stain   Final    WBC PRESENT, PREDOMINANTLY MONONUCLEAR NO ORGANISMS SEEN CYTOSPIN SMEAR    Culture NO GROWTH 3 DAYS  Final   Report Status 11/01/2015 FINAL  Final      Radiology Studies: No results found.    Scheduled Meds: . amLODipine  10 mg Per Tube QHS  . antiseptic oral rinse  7 mL Mouth Rinse q12n4p  . carvedilol  12.5 mg Oral BID WC  . chlorhexidine  15 mL Mouth Rinse BID  . docusate  100 mg Oral BID  . famotidine  20 mg Per Tube QHS  . feeding supplement (PRO-STAT SUGAR FREE 64)  30 mL Per Tube Daily  . free water  100 mL Per Tube Q12H  . heparin subcutaneous  5,000 Units Subcutaneous Q12H  . insulin aspart  0-9 Units Subcutaneous Q4H  . levETIRAcetam  500 mg Intravenous BID  . multivitamin  15 mL Per Tube Daily  . sennosides  5 mL Oral Daily   Continuous Infusions: . feeding supplement (OSMOLITE 1.2 CAL) 1,000 mL (11/06/15 1012)     LOS: 38 days    Time spent: 20 minutes    Faye Ramsay,  MD [facility] Pager [phone]  If 7PM-7AM, please contact night-coverage [email or web address] Password [facility] 11/06/2015, 11:50 AM

## 2015-11-06 NOTE — Progress Notes (Signed)
Orthopedic Tech Progress Note Patient Details:  Joanna Farmer 1955/06/27 [id number]  Patient ID: [name], female   DOB: 03/13/1956, 60 y.o.   MRN: [id number]   Hildred Priest 11/06/2015, 12:23 PM One ortho visit to be deleted

## 2015-11-06 NOTE — Progress Notes (Signed)
Orthopedic Tech Progress Note Patient Details:  Joanna Farmer October 20, 1955 [id number]  Ortho Devices Type of Ortho Device: Abdominal binder Ortho Device/Splint Location: abdomen Ortho Device/Splint Interventions: Loanne Drilling, Angelyse Heslin 11/06/2015, 12:16 PM

## 2015-11-07 DIAGNOSIS — Z930 Tracheostomy status: Secondary | ICD-10-CM

## 2015-11-07 DIAGNOSIS — Z430 Encounter for attention to tracheostomy: Secondary | ICD-10-CM

## 2015-11-07 LAB — GLUCOSE, CAPILLARY
GLUCOSE-CAPILLARY: 125 mg/dL — AB (ref 65–99)
GLUCOSE-CAPILLARY: 130 mg/dL — AB (ref 65–99)
GLUCOSE-CAPILLARY: 142 mg/dL — AB (ref 65–99)
Glucose-Capillary: 133 mg/dL — ABNORMAL HIGH (ref 65–99)
Glucose-Capillary: 130 mg/dL — ABNORMAL HIGH (ref 65–99)

## 2015-11-07 LAB — BASIC METABOLIC PANEL
Anion gap: 8 (ref 5–15)
BUN: 20 mg/dL (ref 6–20)
CALCIUM: 9.8 mg/dL (ref 8.9–10.3)
CHLORIDE: 102 mmol/L (ref 101–111)
CO2: 25 mmol/L (ref 22–32)
CREATININE: 0.48 mg/dL (ref 0.44–1.00)
GFR calc Af Amer: >60 mL/min (ref >60)
GFR calc non Af Amer: >60 mL/min (ref >60)
Glucose, Bld: 134 mg/dL — ABNORMAL HIGH (ref 65–99)
Potassium: 4.3 mmol/L (ref 3.5–5.1)
SODIUM: 135 mmol/L (ref 135–145)

## 2015-11-07 LAB — CBC
HCT: 27.2 % — ABNORMAL LOW (ref 36.0–46.0)
HEMOGLOBIN: 8.6 g/dL — AB (ref 12.0–15.0)
MCH: 30.3 pg (ref 26.0–34.0)
MCHC: 31.6 g/dL (ref 30.0–36.0)
MCV: 95.8 fL (ref 78.0–100.0)
PLATELETS: 409 10*3/uL — AB (ref 150–400)
RBC: 2.84 MIL/uL — AB (ref 3.87–5.11)
RDW: 14.3 % (ref 11.5–15.5)
WBC: 5.7 10*3/uL (ref 4.0–10.5)

## 2015-11-07 MED ORDER — SODIUM CHLORIDE 3 % IN NEBU
4.0000 mL | INHALATION_SOLUTION | Freq: Every day | RESPIRATORY_TRACT | Status: AC
  Administered 2015-11-07 – 2015-11-09 (×3): 4 mL via RESPIRATORY_TRACT
  Filled 2015-11-07 (×3): qty 4

## 2015-11-07 NOTE — Progress Notes (Signed)
Patient ID: Joanna Farmer, female   DOB: 07-25-1955, 60 y.o.   MRN: [id number]     PROGRESS NOTE    Joanna Farmer  [name]: Jun 06, 1955 DOA: 09/29/2015  PCP: Rosita Fire, MD   Brief Narrative:  61 year old female past medical history of coronary artery disease, ischemic cardiomyopathy, and polysubstance abuse being admitted with right thalamic intracranial hemorrhage. Intubated for airway protection. Has failed extubation x 1, now with trach in place, tolerating 24x7 ATC. S/P ventric shunt placement 7/18.   Assessment & Plan: Right Thalamic Intracranial Hemorrhage in pt with known alcohol and cocaine abuse  - S/P IVC 6/16, shunt placement 7/18 - RASS goal 0 - pt not following any commands and non verbal  - PCCM and myself d/w family poor prognosis for any meaningful recovery  - work in progress to d/c pt to SNF closer to family  - s/p PEG tube placement   Acute Hypoxic Respiratory Failure  - Unable to protect airway secondary to [place] - Suspected HCAP - S/P Tx - Continue trach collar as tolerated  - Thick secretions remain - per PCCM, would not downsize until secretions are better controlled - plan to add hypertonic saline nebs to help with this x 3 days - Patient can never be decannulated unless mental status improved significantly.   Fever overnight 7/19 - Tmax 100.8 F, ABX have been already discontinued per PCCM and neurosurgery  - will need to monitor closely as I am worried for developing another infection - no fevers since   Chronic Systolic CHF - EF 123456 w/ grade 1 diastolic dysfunction. RV normal - no signs of volume overload - weight down from 50 --? 48 --> 51 kg - monitor  Essential HTN - reasonable inpatient control  - SBP goal < 160  - continue Norvasc 10 mg QD, Carvedilol 12.5 mg BID, Hydralazine PRN  H/O CAD - S/P PCI in 2015  Hyponatremia and hypernatremia since admission  - resolved   Metabolic Acidosis - resolved   Diarrhea - C. Diff  negative - resolved   Anemia of acute illness, thalamic ICH - No signs of active bleeding currently. 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 H/O HCAP - S/P Tx. - Empiric Vancomycin & Rocephin completed for 5 days as per Neurosurgery secondary to indwelling drain - if persistent fevers, needs repeat blood cultures and drain cultures   DVT prophylaxis: Heparin SQ Code Status: Full  Family Communication: no family at bedside, called son over the phone multiple times, left messages to call me back  Disposition Plan: SNF when bed available   Consultants:   Neurosurgery   Procedures:   Shunt 7/18  Antimicrobials:   Completed ABX therapy    Subjective: Still with thick secretions.   Objective: Vitals:   11/07/15 0230 11/07/15 0427 11/07/15 0850 11/07/15 0901  BP: (!) 113/47   (!) 125/58  Pulse: 81  96 75  Resp: 20  (!) 23 20  Temp: 98.9 F (37.2 C)   99.1 F (37.3 C)  TempSrc: Axillary   Axillary  SpO2: 97%  96% 100%  Weight:  51.2 kg (112 lb 14 oz)    Height:        Intake/Output Summary (Last 24 hours) at 11/07/15 1138 Last data filed at 11/07/15 0905  Gross per 24 hour  Intake                0 ml  Output              450 ml  Net             -450 ml   Filed Weights   11/03/15 0500 11/05/15 0351 11/07/15 0427  Weight: 50.6 kg (111 lb 8 oz) 48.3 kg (106 lb 7.7 oz) 51.2 kg (112 lb 14 oz)    Examination:  General exam: Appears clethargic Respiratory system: Respiratory effort normal. Diminished breath sounds at bases  Cardiovascular system: S1 & S2 heard, RRR. No JVD, rubs, gallops or clicks. No pedal edema. Gastrointestinal system: Abdomen is nondistended, soft and nontender. No organomegaly or masses felt.   Data Reviewed: I have personally reviewed following labs and imaging studies  CBC:  Recent Labs Lab 11/02/15 0427 11/03/15 0232 11/04/15 0525 11/07/15 0258  WBC 7.0 6.9 6.5 5.7  HGB 8.4*  8.6* 9.2* 8.6*  HCT 25.4* 26.1* 28.4* 27.2*  MCV 94.1 94.6 94.7 95.8  PLT 276 297 362 [id number]   Basic Metabolic Panel:  Recent Labs Lab 11/02/15 0427 11/03/15 0232 11/04/15 0525 11/07/15 0258  NA 136 136 135 135  K 4.5 4.6 4.3 4.3  CL 102 106 104 102  CO2 25 22 24 25   GLUCOSE 186* 139* 150* 134*  BUN 14 18 20 20   CREATININE 0.57 0.49 0.49 0.48  CALCIUM 9.4 9.7 10.0 9.8   CBG:  Recent Labs Lab 11/06/15 1659 11/06/15 2017 11/07/15 0002 11/07/15 0353 11/07/15 0747  GLUCAP 129* 125* 125* 133* 130*   Urine analysis:    Component Value Date/Time   COLORURINE YELLOW 10/04/2015 [place] 10/04/2015 1704   LABSPEC 1.016 10/04/2015 1704   PHURINE 7.0 10/04/2015 1704   GLUCOSEU NEGATIVE 10/04/2015 1704   HGBUR NEGATIVE 10/04/2015 1704   BILIRUBINUR NEGATIVE 10/04/2015 1704   KETONESUR NEGATIVE 10/04/2015 1704   PROTEINUR NEGATIVE 10/04/2015 1704   UROBILINOGEN 0.2 12/30/2013 1338   NITRITE NEGATIVE 10/04/2015 1704   LEUKOCYTESUR NEGATIVE 10/04/2015 1704    Recent Results (from the past 240 hour(s))  CSF culture with Stat gram stain     Status: None   Collection Time: 10/29/15  8:43 AM  Result Value Ref Range Status   Specimen Description CSF  Final   Special Requests FROM VENTRICULAR SHUNT  Final   Gram Stain   Final    WBC PRESENT, PREDOMINANTLY MONONUCLEAR NO ORGANISMS SEEN CYTOSPIN SMEAR    Culture NO GROWTH 3 DAYS  Final   Report Status 11/01/2015 FINAL  Final      Radiology Studies: No results found.    Scheduled Meds: . amLODipine  10 mg Per Tube QHS  . antiseptic oral rinse  7 mL Mouth Rinse q12n4p  . carvedilol  12.5 mg Oral BID WC  . chlorhexidine  15 mL Mouth Rinse BID  . docusate  100 mg Oral BID  . famotidine  20 mg Per Tube QHS  . feeding supplement (PRO-STAT SUGAR FREE 64)  30 mL Per Tube Daily  . free water  100 mL Per Tube Q12H  . heparin subcutaneous  5,000 Units Subcutaneous Q12H  . insulin aspart  0-9 Units  Subcutaneous Q4H  . levETIRAcetam  500 mg Intravenous BID  . multivitamin  15 mL Per Tube Daily  . sennosides  5 mL Oral Daily  . sodium chloride HYPERTONIC  4 mL Nebulization Daily   Continuous Infusions: . feeding supplement (OSMOLITE 1.2 CAL) 1,000 mL (11/07/15 0609)     LOS: 39 days  Time spent: 20 minutes    Faye Ramsay, MD [facility] Pager [phone]  If 7PM-7AM, please contact night-coverage [email or web address] Password [facility] 11/07/2015, 11:38 AM

## 2015-11-07 NOTE — Progress Notes (Signed)
PULMONARY / CRITICAL CARE MEDICINE   Name: Joanna Farmer MRN: [id number] DOB: 03-20-1956    ADMISSION DATE:  09/29/2015 CONSULTATION DATE:  09/29/2015  REFERRING MD:  Ezequiel Essex, M.D. / [facility] EDP  CHIEF COMPLAINT:  Altered mental status, thalamic bleed.  BRIEF   60 year old female past medical history as below, which is significant for hypertension, alcohol abuse, cocaine abuse, HSV, coronary artery disease status post stents to RCA and LAD in 2015, and ischemic cardiomyopathy with LV EF 35-40%. She presented to [facility] emergency department 6/15 with altered mental status. Reportedly the night before she suffered a fall during the night, and since that time has been sleeping all day. She was brought to the emergency department with this complaint. In emergency department she was unresponsive but reportedly breathing adequately. She had no spontaneous movement and left gaze deviation, but did have a positive gag reflex. CT scan of the head was obtained and demonstrated acute intracranial hemorrhage with intraventricular extension. Cervical spine was without radiographic evidence of injury. She was intubated for airway protection with anticipation of transfer to [facility]. PCCM accepting.   SUBJECTIVE/OVERNIGHT/INTERVAL HX Tolerating 28% ATC well. Increased secretions and frequent suctioning per RN.   REVIEW OF SYSTEMS:  Unable to obtain given mental status.   VITAL SIGNS: BP (!) 125/58 (BP Location: Right Arm)   Pulse 75   Temp 99.1 F (37.3 C) (Axillary)   Resp 20   Ht 5\' 3"  (1.6 m)   Wt 51.2 kg (112 lb 14 oz)   SpO2 100%   BMI 20.00 kg/m   HEMODYNAMICS:    VENTILATOR SETTINGS: FiO2 (%):  [28 %] 28 %  INTAKE / OUTPUT: I/O last 3 completed shifts: In: 60 [Other:60] Out: 1000 [Urine:1000]  PHYSICAL EXAMINATION: General:  No distress.  Neuro: Non communicative, does not follow commands.  Briefly opens eyes only to tactile stimulation. HEENT:  Moist mucus  membranes, No thyromegaly, JVD, gown and leftward gaze chronic. Trache in place.  Cardiovascular:  RRR, no MRG Pulmonary:  Resps even non labored on ATC, rhonchorous BLF, frequent suctioning.  Abdomen: Soft. Normal bowel sounds. Nondistended. PEG in place Musculoskeletal:  Normal tone and bulk. No joint deformity or effusion appreciated. Skin: Intact  LABS: PULMONARY No results for input(s): PHART, PCO2ART, PO2ART, HCO3, TCO2, O2SAT in the last 168 hours.  Invalid input(s): PCO2, PO2 CBC  Recent Labs Lab 11/03/15 0232 11/04/15 0525 11/07/15 0258  HGB 8.6* 9.2* 8.6*  HCT 26.1* 28.4* 27.2*  WBC 6.9 6.5 5.7  PLT 297 362 409*   COAGULATION No results for input(s): INR in the last 168 hours. CARDIAC  No results for input(s): TROPONINI in the last 168 hours. No results for input(s): PROBNP in the last 168 hours.  CHEMISTRY  Recent Labs Lab 11/02/15 0427 11/03/15 0232 11/04/15 0525 11/07/15 0258  NA 136 136 135 135  K 4.5 4.6 4.3 4.3  CL 102 106 104 102  CO2 25 22 24 25   GLUCOSE 186* 139* 150* 134*  BUN 14 18 20 20   CREATININE 0.57 0.49 0.49 0.48  CALCIUM 9.4 9.7 10.0 9.8   Estimated Creatinine Clearance: 60.4 mL/min (by C-G formula based on SCr of 0.8 mg/dL).  LIVER No results for input(s): AST, ALT, ALKPHOS, BILITOT, PROT, ALBUMIN, INR in the last 168 hours. INFECTIOUS No results for input(s): LATICACIDVEN, PROCALCITON in the last 168 hours. ENDOCRINE CBG (last 3)   Recent Labs  11/07/15 0002 11/07/15 0353 11/07/15 0747  GLUCAP 125* 133* 130*  IMAGING x48h No results found.   SIGNIFICANT EVENTS: 6/15 - Admit to ICU on ventilator for ICH after transfer from [facility] ED 6/24 - Extubated  6/25 - Reintubated  7/18 - Shunt placed & IVD removed 7/21 Trach to #6 cuffless  STUDIES:  CT Head & C-spine 6/15: Acute intracranial hemorrhage with intraventricular extension and possible developing hydrocephalus Although no fractures identified in the cervical spine,  the evaluation of the cervical spine is limited. CT Head 6/19: no significant change since compared to prior scan. CT Head 6/20: Degenerating RIGHT thalamus hematoma with local mass effect and edema. Similar mild hydrocephalus, stable RIGHT frontal ventriculostomy catheter. Degenerating intraventricular blood products.  CT Head 7/5:  Interval ventriculomegaly sparing fourth ventricle with frontal approach EVD positioning appears stable. Expected evolution of hyperdense hemorrhage at center of right cerebral peduncle. Small volume of intraventricular hemorrhage has resolved. CT Head 7/19: Interval VP shunt internalization. Lateral and third ventricular dilation slightly improved relative to 7/18. Interval evolution of right thalamic hemorrhage.  MICROBIOLOGY: Blood Ctx x2 6/15:  Negative  MRSA PCR 6/16:  Negative  HIV 6/17:  Nonreactive RPR 6/17:  Nonreactive  Urine Ctx 6/16:  Citrobacter youngae  Blood Ctx x2 6/20:  Negative Tracheal Asp Ctx 6/20:  Oral [place] CSF Ctx 6/20:  Negative  C diff Stool 7/2:  Negative CSF Ctx 7/9:  Negative  CSF HSV Ab 1.85 (high) CSF HSV 1/2 7/12:  Negative  CSF Ctx 7/15:  Negative   ANTIBIOTICS: Ancef 6/16 (prophylactic abx) Zosyn 6/20 - 7/2 Rocephin 6/15 - 6/17, 7/15>>> Vancomycin 6/20 - 6/22, 7/15>>>  LINES/TUBES: OETT 7.5 6/15 - 6/24; 6/25 - 7/7 OGT 6/15 - 6/24 IVC Drain 6/16 - 7/18 (s/p shunt placement)  Trach 6.0 (JY) 7/7 >> to #6 cuffless 7/21 >>> PEG 7/12>> Foley 7/15>> PIV x3  DISCUSSION:  60 year old female status post right thalamic intracranial hemorrhage. Now status post intraventricular drain placement/internalization. Tolerating tracheostomy collar. Status post PEG tube placement. Trach to #6 cuffless 7/21 tolerating well. Thick secretions remain  Right thalamic intracranial hemorrhage:  - per primary  Acute hypoxic respiratory failure: Status post tracheostomy placement - Would not downsize until secretions are better  controlled - Will add hypertonic saline nebs to help with this x 3 days - Patient can never be decannulated unless mental status improved significantly.   Georgann Housekeeper, AGACNP-BC [place] [facility] Pager [phone] or [phone]  11/07/2015 10:58 AM   ATTENDING NOTE / ATTESTATION NOTE :   I have discussed the case with the resident/APP  Georgann Housekeeper.  I agree with the resident/APP's  history, physical examination, assessment, and plans.    I have edited the above note and modified it according to our agreed history, physical examination, assessment and plan.  Pt with increased trache secretions. Agree with hypertonic saline.  No evidence for infection. Observe for now. If with fever, will need to re-culture and rpt CXR. No decannulation for now unless mental status improves.    Family :Family updated at length today.  I have taken care of this pt in 86[place] in June so I know the family.  D/w family re: plan. Anticipate transfer to [facility]   J. Shirl Harris, MD 11/07/2015, 2:03 PM [place] [facility] Pager [phone] After 3 pm or if no answer, call [phone]

## 2015-11-07 NOTE — Clinical Social Work Note (Signed)
[place] MUST Level II PASARR currently pending. B. Akins assigned and planning to evaluate patient this evening.   Facility Representative of [facility] has evaluated patient at bedside and reported that facility will NOT be able to accommodate patient until trach suctioning is down to every 4 hours. Currently patient is being suctioned every hour and per RN sometimes more.   Information discussed with RNCM. RNCM has informed MD and plans to pursue LTACH at this time.   CSW will continue to follow patient and patient's family for continued support.   Glendon Axe, MSW, LCSWA [phone] 11/07/2015 3:19 PM

## 2015-11-07 NOTE — Care Management Note (Signed)
Case Management Note  Patient Details  Name: Joanna Farmer MRN: [id number] Date of Birth: 1955/05/20  Subjective/Objective:                    Action/Plan: Received information from [facility] that SNF not accepting the patient d/t the frequency of suctioning needed for her trach. CM spoke to Dr Doyle Askew about [facility] and she asked that the order be entered. CM then spoke to the patients daughter that was in the room about LTACH and she was interested in either [facility] or [facility]. CM informed her that the patients Medicaid may be a hindrence. CM also spoke to the patients son via phone and asked about LTACH and he was in agreement that the both facilities be contacted to see if they would accept his mother. CM spoke to [place] with [facility] and she stated they do not accept Medicaid. CM then spoke to [place] at [facility] and she states they take some Medicaid patients and she will look at the referral. CM will continue to follow for discharge disposition.  Expected Discharge Date:                  Expected Discharge Plan:  Skilled Nursing Facility  In-House Referral:  Clinical Social Work  Discharge planning Services  CM Consult  Post Acute Care Choice:    Choice offered to:     DME Arranged:    DME Agency:     HH Arranged:    [place] Agency:     Status of Service:  In process, will continue to follow  If discussed at Long Length of Stay Meetings, dates discussed:    Additional Comments:  Pollie Friar, RN 11/07/2015, 4:31 PM

## 2015-11-07 NOTE — Progress Notes (Signed)
Pt is sleeping comfortably at this time no distress noted. Family member at bedside

## 2015-11-08 LAB — GLUCOSE, CAPILLARY
GLUCOSE-CAPILLARY: 119 mg/dL — AB (ref 65–99)
GLUCOSE-CAPILLARY: 128 mg/dL — AB (ref 65–99)
GLUCOSE-CAPILLARY: 139 mg/dL — AB (ref 65–99)
GLUCOSE-CAPILLARY: 147 mg/dL — AB (ref 65–99)
GLUCOSE-CAPILLARY: 155 mg/dL — AB (ref 65–99)
Glucose-Capillary: 145 mg/dL — ABNORMAL HIGH (ref 65–99)
Glucose-Capillary: 136 mg/dL — ABNORMAL HIGH (ref 65–99)

## 2015-11-08 NOTE — Care Management Note (Signed)
Case Management Note  Patient Details  Name: Joanna Farmer MRN: [id number] Date of Birth: [birth year]  Subjective/Objective:                    Action/Plan: [facility] does not have a Medicaid bed to offer the patient at this time. CM called Pts son and informed him. CSW also informed. CM will continue to monitor for discharge needs.   Expected Discharge Date:                  Expected Discharge Plan:  Skilled Nursing Facility  In-House Referral:  Clinical Social Work  Discharge planning Services  CM Consult  Post Acute Care Choice:    Choice offered to:     DME Arranged:    DME Agency:     HH Arranged:    [place] Agency:     Status of Service:  In process, will continue to follow  If discussed at Long Length of Stay Meetings, dates discussed:    Additional Comments:  Pollie Friar, RN 11/08/2015, 5:55 PM

## 2015-11-08 NOTE — Progress Notes (Signed)
Nutrition Follow-up  INTERVENTION:  Continue Osmolite 1.2 formula at goal rate of 50 ml/hr with Prostat liquid protein 30 ml daily  Total TF regimen to provide 1540 kcal, 81 grams protein, and 984 ml of free water. TF plus FWF (100 ml q 12 hrs) provides a total of 1184 ml of free water daily.   NUTRITION DIAGNOSIS:   Inadequate oral intake related to inability to eat as evidenced by NPO status.  Ongoing  GOAL:   Patient will meet greater than or equal to 90% of their needs  Being met  MONITOR:   TF tolerance, Labs, Weight trends, Skin, I & O's  REASON FOR ASSESSMENT:   Ventilator    ASSESSMENT:   Pt with past medical history of coronary artery disease, ischemic cardiomyopathy, and polysubstance abuse being admitted with right thalamic intracranial hemorrhage, right frontal external ventricular drain placed 6/16.    Patient s/p procedure 7/18: SHUNT INSERTION VENTRICULAR-PERITONEAL (Right) LAPAROSCOPIC INSERTION VENTRICULAR-PERITONEAL (V-P) SHUNT  Pt remains on ATC. Pt continues to receive Osmolite 1.2@ 50 ml/hr with pro-stat once daily. Per RN, pt is tolerating TF well. Pt's weight remains stable from admission.   Labs: low hemoglobin  Diet Order:  Diet NPO time specified Diet - low sodium heart healthy  Skin:  Reviewed, no issues (closed head and closed abdominal incision)  Last BM:  7/23  Height:   Ht Readings from Last 1 Encounters:  09/29/15 '5\' 3"'$  (1.6 m)    Weight:   Wt Readings from Last 1 Encounters:  11/08/15 106 lb 11.2 oz (48.4 kg)    Ideal Body Weight:  52.2 kg  BMI:  Body mass index is 18.9 kg/m.  Estimated Nutritional Needs:   Kcal:  1470-9295  Protein:  70-85 grams  Fluid:  > 1.5 L/day  EDUCATION NEEDS:   No education needs identified at this time  [place], LDN Inpatient Clinical Dietitian Pager: [phone] After Hours Pager: [phone]

## 2015-11-08 NOTE — Clinical Social Work Note (Signed)
[place] MUST has issued Level II PASARR: [id number]   Estelita Iten, MSW, [place] [phone] 11/08/2015 2:08 PM

## 2015-11-08 NOTE — Progress Notes (Signed)
Patient ID: Joanna Farmer, female   DOB: [birth year], 60 y.o.   MRN: [id number]     PROGRESS NOTE    Joanna Farmer  [name]: [birth year] DOA: 09/29/2015  PCP: Joanna Fire, MD   Brief Narrative:  60 year old female past medical history of coronary artery disease, ischemic cardiomyopathy, and polysubstance abuse being admitted with right thalamic intracranial hemorrhage. Intubated for airway protection. Has failed extubation x 1, now with trach in place, tolerating 24x7 ATC. S/P ventric shunt placement 7/18.   Assessment & Plan: Right Thalamic Intracranial Hemorrhage in pt with known alcohol and cocaine abuse  - S/P IVC 6/16, shunt placement 7/18 - RASS goal 0 - pt not following any commands and non verbal  - PCCM and myself d/w family poor prognosis for any meaningful recovery  - s/p PEG tube placement  - consult for ? LTACH placed   Acute Hypoxic Respiratory Failure  - Unable to protect airway secondary to [place] - Suspected HCAP - S/P Tx - Continue trach collar as tolerated  - Thick secretions remain - per PCCM, would not downsize until secretions are better controlled - plan to add hypertonic saline nebs to help with this x 3 days - Patient can never be decannulated unless mental status improved significantly.   Fever overnight 7/19 - Tmax 100.8 F, ABX have been already discontinued per PCCM and neurosurgery  - will need to monitor closely as I am worried for developing another infection - no fevers since   Chronic Systolic CHF - EF 123456 w/ grade 1 diastolic dysfunction. RV normal - no signs of volume overload - weight down from 50 --? 48 --> 51 kg - monitor  Essential HTN - reasonable inpatient control  - SBP goal < 160  - continue Norvasc 10 mg QD, Carvedilol 12.5 mg BID, Hydralazine PRN  H/O CAD - S/P PCI in 2015  Hyponatremia and hypernatremia since admission  - resolved   Metabolic Acidosis - resolved   Diarrhea - C. Diff negative - resolved    Anemia of acute illness, thalamic ICH - No signs of active bleeding currently. 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 H/O HCAP - S/P Tx. - Empiric Vancomycin & Rocephin completed for 5 days as per Neurosurgery secondary to indwelling drain - if persistent fevers, needs repeat blood cultures and drain cultures   DVT prophylaxis: Heparin SQ Code Status: Full  Family Communication: fiance at bedside  Disposition Plan: SNF refused due to complexity of care, LTACH consult placed  Consultants:   Neurosurgery   Procedures:   Shunt 7/18  Antimicrobials:   Completed ABX therapy    Subjective: Still with thick secretions.   Objective: Vitals:   11/08/15 0403 11/08/15 0500 11/08/15 0556 11/08/15 0843  BP:   127/67   Pulse: 99  92 94  Resp: 19  18 18   Temp:   98.7 F (37.1 C)   TempSrc:   Axillary   SpO2: 100%  100% 100%  Weight:  48.4 kg (106 lb 11.2 oz)    Height:        Intake/Output Summary (Last 24 hours) at 11/08/15 1126 Last data filed at 11/08/15 0357  Gross per 24 hour  Intake                0 ml  Output              850 ml  Net             -  850 ml   Filed Weights   11/05/15 0351 11/07/15 0427 11/08/15 0500  Weight: 48.3 kg (106 lb 7.7 oz) 51.2 kg (112 lb 14 oz) 48.4 kg (106 lb 11.2 oz)    Examination:  General exam: Appears clethargic Respiratory system: Respiratory effort normal. Diminished breath sounds at bases  Cardiovascular system: S1 & S2 heard, RRR. No JVD, rubs, gallops or clicks. No pedal edema. Gastrointestinal system: Abdomen is nondistended, soft and nontender. No organomegaly or masses felt.   Data Reviewed: I have personally reviewed following labs and imaging studies  CBC:  Recent Labs Lab 11/02/15 0427 11/03/15 0232 11/04/15 0525 11/07/15 0258  WBC 7.0 6.9 6.5 5.7  HGB 8.4* 8.6* 9.2* 8.6*  HCT 25.4* 26.1* 28.4* 27.2*  MCV 94.1 94.6 94.7 95.8  PLT 276 297 362 409*    Basic Metabolic Panel:  Recent Labs Lab 11/02/15 0427 11/03/15 0232 11/04/15 0525 11/07/15 0258  NA 136 136 135 135  K 4.5 4.6 4.3 4.3  CL 102 106 104 102  CO2 25 22 24 25   GLUCOSE 186* 139* 150* 134*  BUN 14 18 20 20   CREATININE 0.57 0.49 0.49 0.48  CALCIUM 9.4 9.7 10.0 9.8   CBG:  Recent Labs Lab 11/07/15 1718 11/07/15 1931 11/08/15 0008 11/08/15 0320 11/08/15 0804  GLUCAP 130* 142* 155* 147* 128*   Urine analysis:    Component Value Date/Time   COLORURINE YELLOW 10/04/2015 [place] 10/04/2015 1704   LABSPEC 1.016 10/04/2015 1704   PHURINE 7.0 10/04/2015 1704   GLUCOSEU NEGATIVE 10/04/2015 1704   HGBUR NEGATIVE 10/04/2015 1704   BILIRUBINUR NEGATIVE 10/04/2015 1704   KETONESUR NEGATIVE 10/04/2015 1704   PROTEINUR NEGATIVE 10/04/2015 1704   UROBILINOGEN 0.2 12/30/2013 1338   NITRITE NEGATIVE 10/04/2015 1704   LEUKOCYTESUR NEGATIVE 10/04/2015 1704    No results found for this or any previous visit (from the past 240 hour(s)).    Radiology Studies: No results found.    Scheduled Meds: . amLODipine  10 mg Per Tube QHS  . antiseptic oral rinse  7 mL Mouth Rinse q12n4p  . carvedilol  12.5 mg Oral BID WC  . chlorhexidine  15 mL Mouth Rinse BID  . docusate  100 mg Oral BID  . famotidine  20 mg Per Tube QHS  . feeding supplement (PRO-STAT SUGAR FREE 64)  30 mL Per Tube Daily  . free water  100 mL Per Tube Q12H  . heparin subcutaneous  5,000 Units Subcutaneous Q12H  . insulin aspart  0-9 Units Subcutaneous Q4H  . levETIRAcetam  500 mg Intravenous BID  . multivitamin  15 mL Per Tube Daily  . sennosides  5 mL Oral Daily  . sodium chloride HYPERTONIC  4 mL Nebulization Daily   Continuous Infusions: . feeding supplement (OSMOLITE 1.2 CAL) 1,000 mL (11/08/15 0322)     LOS: 40 days    Time spent: 20 minutes    Faye Ramsay, MD [facility] Pager [phone]  If 7PM-7AM, please contact  night-coverage [email or web address] Password [facility] 11/08/2015, 11:26 AM

## 2015-11-09 LAB — GLUCOSE, CAPILLARY
GLUCOSE-CAPILLARY: 110 mg/dL — AB (ref 65–99)
GLUCOSE-CAPILLARY: 117 mg/dL — AB (ref 65–99)
GLUCOSE-CAPILLARY: 131 mg/dL — AB (ref 65–99)
GLUCOSE-CAPILLARY: 139 mg/dL — AB (ref 65–99)
GLUCOSE-CAPILLARY: 140 mg/dL — AB (ref 65–99)
Glucose-Capillary: 122 mg/dL — ABNORMAL HIGH (ref 65–99)
Glucose-Capillary: 148 mg/dL — ABNORMAL HIGH (ref 65–99)

## 2015-11-09 NOTE — Progress Notes (Signed)
Patient ID: Joanna Farmer, female   DOB: 1955/07/12, 60 y.o.   MRN: [id number]     PROGRESS NOTE    Joanna Farmer  [name]: Jun 05, 1955 DOA: 09/29/2015  PCP: Rosita Fire, MD   Brief Narrative:  60 year old female past medical history of coronary artery disease, ischemic cardiomyopathy, and polysubstance abuse being admitted with right thalamic intracranial hemorrhage. Intubated for airway protection. Has failed extubation x 1, now with trach in place, tolerating 24x7 ATC. S/P ventric shunt placement 7/18.   Assessment & Plan: Right Thalamic Intracranial Hemorrhage in pt with known alcohol and cocaine abuse  - S/P IVC 6/16, shunt placement 7/18 - RASS goal 0 - pt not following any commands and non verbal  - PCCM and myself d/w family poor prognosis for any meaningful recovery  - s/p PEG tube placement  - consult for ? LTACH placed, work in progress  - no events overnight   Acute Hypoxic Respiratory Failure  - Unable to protect airway secondary to [place] - Suspected HCAP - S/P Tx - Continue trach collar as tolerated  - Thick secretions remain - per PCCM, would not downsize until secretions are better controlled - plan to add hypertonic saline nebs to help with this x 3 days - Patient can never be decannulated unless mental status improved significantly.   Fever overnight 7/19 - Tmax 100.8 F, ABX have been already discontinued per PCCM and neurosurgery  - will need to monitor closely as I am worried for developing another infection - no fevers since, will continue to monitor   Chronic Systolic CHF - EF [id number] w/ grade 1 diastolic dysfunction. RV normal - no signs of volume overload - weight down from 50 --? 48 --> 51 kg - monitor  Essential HTN - reasonable inpatient control  - SBP goal < 160  - continue Norvasc 10 mg QD, Carvedilol 12.5 mg BID, Hydralazine PRN  H/O CAD - S/P PCI in 2015  Hyponatremia and hypernatremia since admission  - resolved   Metabolic  Acidosis - resolved   Diarrhea - C. Diff negative - resolved   Anemia of acute illness, thalamic ICH - No signs of active bleeding currently. 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 H/O HCAP - S/P Tx. - Empiric Vancomycin & Rocephin completed for 5 days as per Neurosurgery secondary to indwelling drain - if persistent fevers, needs repeat blood cultures and drain cultures   DVT prophylaxis: Heparin SQ Code Status: Full  Family Communication: fiance at bedside  Disposition Plan: SNF refused due to complexity of care, LTACH consult placed  Consultants:   Neurosurgery   Procedures:   Shunt 7/18  Antimicrobials:   Completed ABX therapy    Subjective: Still with thick secretions.   Objective: Vitals:   11/08/15 2340 11/09/15 0140 11/09/15 0428 11/09/15 0508  BP:  (!) 122/51  (!) 112/51  Pulse: 68 75 71 70  Resp: 16 16 16 16   Temp:  97.7 F (36.5 C)  97.7 F (36.5 C)  TempSrc:  Axillary  Axillary  SpO2: 100% 100% 100% 100%  Weight:      Height:        Intake/Output Summary (Last 24 hours) at 11/09/15 0840 Last data filed at 11/09/15 0643  Gross per 24 hour  Intake                0 ml  Output  900 ml  Net             -900 ml   Filed Weights   11/05/15 0351 11/07/15 0427 11/08/15 0500  Weight: 48.3 kg (106 lb 7.7 oz) 51.2 kg (112 lb 14 oz) 48.4 kg (106 lb 11.2 oz)    Examination:  General exam: Appears clethargic Respiratory system: Respiratory effort normal. Diminished breath sounds at bases  Cardiovascular system: S1 & S2 heard, RRR. No JVD, rubs, gallops or clicks. No pedal edema. Gastrointestinal system: Abdomen is nondistended, soft and nontender. No organomegaly or masses felt.   Data Reviewed: I have personally reviewed following labs and imaging studies  CBC:  Recent Labs Lab 11/03/15 0232 11/04/15 0525 11/07/15 0258  WBC 6.9 6.5 5.7  HGB 8.6* 9.2* 8.6*  HCT 26.1* 28.4*  27.2*  MCV 94.6 94.7 95.8  PLT 297 362 [id number]*   Basic Metabolic Panel:  Recent Labs Lab 11/03/15 0232 11/04/15 0525 11/07/15 0258  NA 136 135 135  K 4.6 4.3 4.3  CL 106 104 102  CO2 22 24 25   GLUCOSE 139* 150* 134*  BUN 18 20 20   CREATININE 0.49 0.49 0.48  CALCIUM 9.7 10.0 9.8   CBG:  Recent Labs Lab 11/08/15 1656 11/08/15 2119 11/09/15 0053 11/09/15 0534 11/09/15 0733  GLUCAP 145* 136* 117* 122* 148*   Urine analysis:    Component Value Date/Time   COLORURINE YELLOW 10/04/2015 [place] 10/04/2015 1704   LABSPEC 1.016 10/04/2015 1704   PHURINE 7.0 10/04/2015 1704   GLUCOSEU NEGATIVE 10/04/2015 1704   HGBUR NEGATIVE 10/04/2015 1704   BILIRUBINUR NEGATIVE 10/04/2015 1704   KETONESUR NEGATIVE 10/04/2015 1704   PROTEINUR NEGATIVE 10/04/2015 1704   UROBILINOGEN 0.2 12/30/2013 1338   NITRITE NEGATIVE 10/04/2015 1704   LEUKOCYTESUR NEGATIVE 10/04/2015 1704    No results found for this or any previous visit (from the past 240 hour(s)).    Radiology Studies: No results found.    Scheduled Meds: . amLODipine  10 mg Per Tube QHS  . antiseptic oral rinse  7 mL Mouth Rinse q12n4p  . carvedilol  12.5 mg Oral BID WC  . chlorhexidine  15 mL Mouth Rinse BID  . docusate  100 mg Oral BID  . famotidine  20 mg Per Tube QHS  . feeding supplement (PRO-STAT SUGAR FREE 64)  30 mL Per Tube Daily  . free water  100 mL Per Tube Q12H  . heparin subcutaneous  5,000 Units Subcutaneous Q12H  . insulin aspart  0-9 Units Subcutaneous Q4H  . levETIRAcetam  500 mg Intravenous BID  . multivitamin  15 mL Per Tube Daily  . sennosides  5 mL Oral Daily  . sodium chloride HYPERTONIC  4 mL Nebulization Daily   Continuous Infusions: . feeding supplement (OSMOLITE 1.2 CAL) 1,000 mL (11/08/15 0322)     LOS: 41 days    Time spent: 20 minutes    Faye Ramsay, MD [facility] Pager [phone]  If 7PM-7AM, please contact  night-coverage [email or web address] Password [email or web address] 11/09/2015, 8:40 AM

## 2015-11-09 NOTE — Progress Notes (Signed)
Will wait for RT to come for this afternoon to assist with  inner cannula cleaning.

## 2015-11-10 ENCOUNTER — Other Ambulatory Visit (HOSPITAL_COMMUNITY): Payer: Self-pay

## 2015-11-10 ENCOUNTER — Inpatient Hospital Stay
Admission: AD | Admit: 2015-11-10 | Discharge: 2015-11-25 | Disposition: A | Discharge status: Skilled Nursing Facility | Payer: Self-pay | Source: Other Acute Inpatient Hospital | Attending: Internal Medicine | Admitting: Internal Medicine

## 2015-11-10 DIAGNOSIS — J189 Pneumonia, unspecified organism: Secondary | ICD-10-CM

## 2015-11-10 DIAGNOSIS — J9690 Respiratory failure, unspecified, unspecified whether with hypoxia or hypercapnia: Secondary | ICD-10-CM

## 2015-11-10 LAB — GLUCOSE, CAPILLARY
GLUCOSE-CAPILLARY: 117 mg/dL — AB (ref 65–99)
GLUCOSE-CAPILLARY: 122 mg/dL — AB (ref 65–99)
Glucose-Capillary: 94 mg/dL (ref 65–99)

## 2015-11-10 LAB — BLOOD GAS, ARTERIAL
ACID-BASE EXCESS: 0.9 mmol/L (ref 0.0–2.0)
Bicarbonate: 25.1 mEq/L — ABNORMAL HIGH (ref 20.0–24.0)
FIO2: 0.28
O2 Saturation: 98.7 %
PATIENT TEMPERATURE: 98.6
TCO2: 26.3 mmol/L (ref 0–100)
pCO2 arterial: 40.2 mmHg (ref 35.0–45.0)
pH, Arterial: 7.411 (ref 7.350–7.450)
pO2, Arterial: 131 mmHg — ABNORMAL HIGH (ref 80.0–100.0)

## 2015-11-10 NOTE — Care Management Note (Signed)
Case Management Note  Patient Details  Name: SUZZANE WARRIOR MRN: [id number] Date of Birth: Sep 13, 1955  Subjective/Objective:                    Action/Plan: Pt discharging to [facility] today. No further needs per CM.   Expected Discharge Date:                  Expected Discharge Plan:  Skilled Nursing Facility  In-House Referral:  Clinical Social Work  Discharge planning Services  CM Consult  Post Acute Care Choice:    Choice offered to:     DME Arranged:    DME Agency:     HH Arranged:    [place] Agency:     Status of Service:  In process, will continue to follow  If discussed at Long Length of Stay Meetings, dates discussed:    Additional Comments:  Pollie Friar, RN 11/10/2015, 9:12 AM

## 2015-11-10 NOTE — Care Management Note (Signed)
Case Management Note  Patient Details  Name: Joanna Farmer MRN: [id number] Date of Birth: 1955-07-27  Subjective/Objective:                    Action/Plan: CM spoke with Carinna at [facility] this am and asked her to relook at [facility] [place] as a possible charity. CM spoke with Carinna at [facility] this afternoon and they are able to admit [facility] tomorrow. CM informed Dr Doyle Askew and Ms [place] daughter and son. CM continuing to follow for discharge needs.   Expected Discharge Date:                  Expected Discharge Plan:  Skilled Nursing Facility  In-House Referral:  Clinical Social Work  Discharge planning Services  CM Consult  Post Acute Care Choice:    Choice offered to:     DME Arranged:    DME Agency:     HH Arranged:    [place] Agency:     Status of Service:  In process, will continue to follow  If discussed at Long Length of Stay Meetings, dates discussed:    Additional Comments:  Pollie Friar, RN 11/10/2015, 9:09 AM

## 2015-11-10 NOTE — Progress Notes (Signed)
Patient is being d/c home. D/c instructions given and patient verbalized understanding. 

## 2015-11-10 NOTE — Discharge Summary (Addendum)
Physician Discharge Summary  Joanna Farmer [name]: 10/13/1955 DOA: 09/29/2015  PCP: Rosita Fire, MD  Admit date: 09/29/2015 Discharge date: 11/10/2015  Recommendations for Outpatient Follow-up:  1. Pt will be discharged LTACH for continuation of care   Discharge Diagnoses:  Active Problems:   ICH (intracerebral hemorrhage) (HCC)   Acute respiratory failure (HCC)   Cytotoxic brain edema (HCC)   Obstructive hydrocephalus   Brain herniation ([place])   HCAP (healthcare-associated pneumonia)   Abnormal neurological exam   Tracheostomy status ([place])   Tracheostomy care [facility])   Discharge Condition: Stable  Diet recommendation: feeding via PEG tube  Brief Narrative:  60 year old female past medical history of coronary artery disease, ischemic cardiomyopathy, and polysubstance abuse being admitted with right thalamic intracranial hemorrhage. Intubated for airway protection. Has failed extubation x 1, now with trach in place, tolerating 24x7 ATC. S/P ventric shunt placement 7/18.   Assessment & Plan: Right Thalamic Intracranial Hemorrhage in pt with known alcohol and cocaine abuse  - also with BP in ED 245/110 from known prior uncontrolled HTN and active alcohol and cocaine abuse (positive drug screen on admission) - S/P IVC 6/16, shunt placement 7/18 - RASS goal 0 - pt not following any commands and non verbal  - PCCM and myself d/w family poor prognosis for any meaningful recovery  - s/p PEG tube placement  - d/c to LTACH today  - no events overnight   Acute Hypoxic Respiratory Failure  - Unable to protect airway secondary to [place] - Suspected HCAP - S/P Tx - Continue trach collar as tolerated  - Thick secretions remain - per PCCM, would not downsize until secretions are better controlled - added hypertonic saline nebs to help with this, received for three days, seems like it did help a little bit but was only recommended for three days -Patient can never be  decannulated unless mental status improved significantly.   Fever overnight 7/19 - Tmax 100.8 F, ABX have been already discontinued per PCCM and neurosurgery  - will need to monitor closely as I am worried for developing another infection - no fevers since, will continue to monitor   Chronic Systolic CHF - EF [id number] w/ grade 1 diastolic dysfunction. RV normal - no signs of volume overload - weight down from 50 --? 48 --> 51 kg  HTN-ive emergency and crisis resulting in [place] - initially requiring cardene drip but subsequently improved  - continue Norvasc 10 mg QD, Carvedilol 12.5 mg BID  H/O CAD - S/P PCI in 2015  Hyponatremia and hypernatremia since admission  - resolved   Metabolic Acidosis - resolved   Diarrhea - C. Diff negative - resolved   Anemia of acute illness, thalamic ICH - No signs of active bleeding currently. 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 H/O HCAP - S/P Tx. - Empiric Vancomycin & Rocephin completed for 5 days as per Neurosurgery secondary to indwelling drain - if persistent fevers, needs repeat blood cultures and drain cultures   UTI citrobacter positive cultures  - sensitive to Rocephin which pt has received   DVT prophylaxis: Heparin SQ Code Status: Full  Family Communication: fiance at bedside  Disposition Plan: LTACH  Consultants:   Neurosurgery   Procedures:   Shunt 7/18  Antimicrobials:   Completed ABX therapy   Procedures/Studies: Ct Head Wo Contrast  Result Date: 11/02/2015 CLINICAL DATA:  Follow-up exam status post EXAM: CT HEAD WITHOUT CONTRAST TECHNIQUE: Contiguous axial  images were obtained from the base of the skull through the vertex without intravenous contrast. COMPARISON:  Prior CT from 11/01/2015. FINDINGS: Postoperative changes from interval VP shunt internalization are present. Skin staples at the right frontal scalp. Soft tissue emphysema about the  reservoir along the shunt catheter within the right scalp and neck. Scalp soft tissues otherwise within normal limits. No acute abnormality about the globes and orbits. Visualized paranasal sinuses are largely clear. Small left mastoid effusion, stable. Right mastoid air cells clear. Right frontal approach shunt catheter in place with tip at the foramen of [place]. Hypodensity along the catheter tract within the right frontal lobe is similar. Overall, lateral and third ventricular dilatation appears slightly improved from previous. Persistent hypodensity about the ventricles likely reflects persistent transependymal flow of CSF. Degenerating hemorrhage centered at the right thalamus again seen, stable. Remote lacunar infarct at the right basal ganglia also noted. No new intracranial hemorrhage. No extra-axial fluid collection. No acute infarct. IMPRESSION: 1. Sequela of interval VP shunt internalization. Right frontal approach shunt catheter tip terminates near the foramen of [place]ro. Lateral and third ventricular dilatation slightly improved relative to 11/01/2015. 2. Continued interval evolution of right thalamic hemorrhage. Electronically Signed   By: Jeannine Boga M.D.   On: 11/02/2015 06:35   Ct Head Wo Contrast  Result Date: 11/01/2015 CLINICAL DATA:  Followup intracranial hemorrhage and intraventricular external drainage catheter. EXAM: CT HEAD WITHOUT CONTRAST TECHNIQUE: Contiguous axial images were obtained from the base of the skull through the vertex without intravenous contrast. COMPARISON:  10/19/2015 FINDINGS: Previously seen right thalamic hemorrhage continues to become less dense. No evidence of additional acute bleeding. Ventriculostomy placed from a right frontal approach enters the frontal horn of the right lateral ventricle in has its tip in apposition to the anterior inferior septum pellucidum. There continues to be hydrocephalus of the lateral and third ventricles an this is probably  slightly worsened. There is more low-density around the ventricles likely due to transependymal resorption. No extra-axial collection.  No primary bone finding. IMPRESSION: Possible ventricular drain malfunction. Slight further increase in size of the lateral and third ventricles, probably with transependymal resorption of CSF. Expected evolutionary changes of the right thalamic hemorrhage, becoming less dense. Electronically Signed   By: Nelson Chimes M.D.   On: 11/01/2015 11:04   Ct Head Wo Contrast  Result Date: 10/19/2015 CLINICAL DATA:  60 year old female with intracranial hemorrhage status post external ventricular drain placement. Ventriculomegaly. Initial encounter. EXAM: CT HEAD WITHOUT CONTRAST TECHNIQUE: Contiguous axial images were obtained from the base of the skull through the vertex without intravenous contrast. COMPARISON:  10/13/2015 and earlier. FINDINGS: Stable visualized osseous structures. Paranasal sinuses and mastoids are stable and well pneumatized. No acute orbit or scalp soft tissue finding. Right frontal approach external ventricular drain re- demonstrated. The intracranial catheter tip abuts the septum pellucidum and appears to communicate with the right lateral ventricle as seen on series 207, image 28. Significant generalized ventricular enlargement since 10/13/2015, relatively sparing the fourth ventricle. Expected evolution of the 3-4 cm hyperdense central brain hemorrhage centered at the right cerebral peduncle. Hyperdense hemorrhage there has decreased to 35 x 28 mm versus 39 x 33 mm previously. Previously-seen small volume lateral intraventricular hemorrhage appears resolved. No intraventricular or extra-axial hemorrhage identified. No new intracranial hemorrhage. Hypodense edema surrounding the resolving parenchymal hematoma has not significantly changed. Stable gray-white matter differentiation elsewhere. No cortically based acute infarct identified. IMPRESSION: 1. Interval  ventriculomegaly relatively sparing the fourth ventricle, while the right  frontal approach EVD positioning appears stable. Query EVD malfunction. 2. Expected evolution of the hyperdense hemorrhage centered at the right cerebral peduncle. Small volume of intraventricular hemorrhage has resolved. Electronically Signed   By: Genevie Ann M.D.   On: 10/19/2015 11:52   Ct Head Wo Contrast  Result Date: 10/13/2015 CLINICAL DATA:  Intracranial hemorrhage follow-up EXAM: CT HEAD WITHOUT CONTRAST TECHNIQUE: Contiguous axial images were obtained from the base of the skull through the vertex without intravenous contrast. COMPARISON:  10/10/2015 FINDINGS: Hemorrhage in the right thalamus and basal ganglia unchanged measuring 33 x 39 mm. Hypodensity edema surrounds the hematoma. Hematoma displaces the midline to the left unchanged. Right frontal ventricular catheter tip in the right frontal horn unchanged. Decreased intraventricular hemorrhage. Decreased ventricular size since the prior study. No acute infarct.  No evidence of new hemorrhage since prior study. IMPRESSION: Right thalamic hematoma unchanged. Decreased intraventricular hemorrhage and decreased ventricular size since the prior CT. Electronically Signed   By: Franchot Gallo M.D.   On: 10/13/2015 07:05   Dg Chest Port 1 View  Result Date: 10/21/2015 CLINICAL DATA:  Respiratory failure, new tracheostomy tube EXAM: PORTABLE CHEST 1 VIEW COMPARISON:  10/21/2015 FINDINGS: Cardiac shadow is within normal limits. The lungs are well aerated. A feeding catheter is again identified. New tracheostomy tube is noted in satisfactory position. Patchy changes in the right base are noted slightly increased from the prior exam. IMPRESSION: Slight increase in degree of right basilar atelectasis. New tracheostomy tube. Electronically Signed   By: Inez Catalina M.D.   On: 10/21/2015 11:05   Dg Chest Port 1 View  Result Date: 10/21/2015 CLINICAL DATA:  Intubated patient, acute  respiratory failure, pneumonia, intracranial hemorrhage with cerebral edema EXAM: PORTABLE CHEST 1 VIEW COMPARISON:  Portable chest x-ray of October 20, 2015 FINDINGS: The lungs are slightly less well inflated today. Bibasilar interstitial density is present. There is no significant pleural effusion. The heart is top-normal in size. The pulmonary vascularity is normal. The endotracheal tube tip lies 5.2 cm above the carina. The feeding tube tip projects over the distal stomach or proximal duodenum. IMPRESSION: Bibasilar subsegmental atelectasis greater on the left. No pleural effusion or pulmonary edema. The support tubes are in stable position. Electronically Signed   By: David  Martinique M.D.   On: 10/21/2015 07:25   Dg Chest Port 1 View  Result Date: 10/20/2015 CLINICAL DATA:  Ischemic cardiomyopathy, suspect acute MI, known cardiac steps, respiratory failure and pneumonia, acute intracranial hemorrhage EXAM: PORTABLE CHEST 1 VIEW COMPARISON:  Portable chest x-ray of October 18, 2015 FINDINGS: The lungs are well-expanded. Infiltrates at both lung bases are less conspicuous. There is no significant pleural effusion and no pneumothorax. The heart is top-normal in size. The pulmonary vascularity is normal. There is calcification in the wall of the aortic arch. The endotracheal tube tip lies 4.7 cm above the carina. The feeding tube tip projects below the inferior margin of the image with multiple overlapping coils in the stomach. IMPRESSION: Slight interval improvement in the appearance of the lungs with decreased atelectasis or infiltrate at the bases. No CHF. Aortic atherosclerosis. Electronically Signed   By: David  Martinique M.D.   On: 10/20/2015 07:08   Dg Chest Port 1 View  Result Date: 10/18/2015 CLINICAL DATA:  60 year old female with a history of adjusted endotracheal tube EXAM: PORTABLE CHEST 1 VIEW COMPARISON:  10/18/2015 FINDINGS: Cardiomediastinal silhouette unchanged. Calcifications of the aortic arch.  Vascular stent projects adjacent to the left heart border. Enteric feeding  tube projects over the mediastinum and terminates at the midline overlying the spine. Endotracheal tube has been advanced in the interval, suitably position above the carina, approximately 4.4 cm. Mixed interstitial and airspace opacities at the bilateral lung bases with decreased lung volumes compared to the prior. No large pleural effusion or pneumothorax. IMPRESSION: Interval advanced endotracheal tube, which now terminates suitably above the carina approximately 4.4 cm. Decreased lung volumes with bibasilar opacities, potentially atelectasis. Unchanged position of the feeding tube, which terminates in the abdomen overlying the midline. Aortic atherosclerosis.  Evidence of prior PTCI. Signed, Dulcy Fanny. Earleen Newport, DO [facility] Electronically Signed   By: Corrie Mckusick D.O.   On: 10/18/2015 11:57   Dg Chest Port 1 View  Result Date: 10/18/2015 CLINICAL DATA:  Intubated EXAM: PORTABLE CHEST 1 VIEW COMPARISON:  10/17/2015 FINDINGS: Endotracheal tube has been retracted and is now 8.5 cm above the carina near the thoracic inlet. Heart is normal size. There is bibasilar atelectasis. No effusions. No acute bony abnormality. IMPRESSION: Retraction of the endotracheal tube, now 8.5 cm above the carina. Bibasilar atelectasis. Electronically Signed   By: Rolm Baptise M.D.   On: 10/18/2015 07:59   Dg Chest Port 1 View  Result Date: 10/17/2015 CLINICAL DATA:  Intubated patient EXAM: PORTABLE CHEST 1 VIEW COMPARISON:  None in PACs FINDINGS: The lungs are adequately inflated. There is subtle increased alveolar density just above the right lateral costophrenic angle. The cardiac silhouette is normal in size. The pulmonary vascularity is not engorged. There is calcification in the wall of the aortic arch. The endotracheal tube tip lies 3.8 cm above the carina. The feeding tube tip lies in the  region of the pylorus or proximal duodenum. There is coiling of much of the feeding tube in the stomach which may place the patient at risk for knotting of the tube. IMPRESSION: Right basilar atelectasis or subtle infiltrate. Otherwise no acute cardiopulmonary abnormality. The support tubes are in reasonable position. See the discussion above regarding the coiling of the feeding tube within the stomach. There is aortic atherosclerosis. Electronically Signed   By: David  Martinique M.D.   On: 10/17/2015 07:32   Dg Chest Port 1 View  Result Date: 10/15/2015 CLINICAL DATA:  End of battery life of intrathecal infusion pump. EXAM: PORTABLE CHEST 1 VIEW COMPARISON:  10/11/2015 chest radiograph. FINDINGS: Endotracheal tube tip is 3.9 cm above the carina. Enteric tube enters stomach with the tip not seen on this image. Stable cardiomediastinal silhouette with top-normal heart size. No pneumothorax. No pleural effusion. No pulmonary edema. Mild patchy bibasilar lung opacities appear decreased. IMPRESSION: 1. Support structures as described . 2. Improved lung volumes with decreased mild patchy bibasilar lung opacities, favor atelectasis. Electronically Signed   By: Ilona Sorrel M.D.   On: 10/15/2015 11:32   Dg Abd Portable 1v  Result Date: 10/21/2015 CLINICAL DATA:  Check feeding catheter placement EXAM: PORTABLE ABDOMEN - 1 VIEW COMPARISON:  10/08/2015 FINDINGS: Scattered large and small bowel gas is noted. A feeding catheter is noted coiled within the stomach with the tip directed towards the pyloric channel. This is similar in appearance to that seen on the prior exam. IMPRESSION: Feeding catheter in the distal stomach as described. Electronically Signed   By: Inez Catalina M.D.   On: 10/21/2015 11:04   Discharge Exam: Vitals:   11/10/15 0409 11/10/15 0503  BP:  (!) 134/49  Pulse: 90 86  Resp: (!) 183 20  Temp:  98.5  F (36.9 C)   Vitals:   11/10/15 0110 11/10/15 0409 11/10/15 0418 11/10/15 0503  BP: (!)  155/75   (!) 134/49  Pulse: 75 90  86  Resp: 18 (!) 183  20  Temp: 98.2 F (36.8 C)   98.5 F (36.9 C)  TempSrc: Axillary   Oral  SpO2: 100% 100%  100%  Weight:   49.3 kg (108 lb 9.6 oz) 53.5 kg (118 lb)  Height:        General: Pt is non verbal, opens eyes, not following commands  Cardiovascular: Regular rate and rhythm, S1/S2 +, no rubs, no gallops Respiratory: diminished breath sounds at bases with frequent secretions that need suctioning  Abdominal: Soft, non tender, non distended, bowel sounds +, no guarding   Discharge Instructions  Discharge Instructions    Diet - low sodium heart healthy    Complete by:  As directed   Diet - low sodium heart healthy    Complete by:  As directed   Increase activity slowly    Complete by:  As directed   Increase activity slowly    Complete by:  As directed       Medication List    STOP taking these medications   aspirin 81 MG tablet   atorvastatin 80 MG tablet Commonly known as:  LIPITOR   clopidogrel 75 MG tablet Commonly known as:  PLAVIX   hydrochlorothiazide 12.5 MG tablet Commonly known as:  HYDRODIURIL   losartan 50 MG tablet Commonly known as:  COZAAR   nitroGLYCERIN 0.4 MG SL tablet Commonly known as:  NITROSTAT   Potassium Chloride ER 20 MEQ Tbcr     TAKE these medications   acetaminophen 160 MG/5ML solution Commonly known as:  TYLENOL Place 20.3 mLs (650 mg total) into feeding tube every 4 (four) hours as needed for fever (Temp > 100.5 F).   amLODipine 10 MG tablet Commonly known as:  NORVASC Place 1 tablet (10 mg total) into feeding tube at bedtime.   bisacodyl 10 MG suppository Commonly known as:  DULCOLAX Place 1 suppository (10 mg total) rectally daily as needed for moderate constipation.   carvedilol 6.25 MG tablet Commonly known as:  COREG Take 1 tablet (6.25 mg total) by mouth 2 (two) times daily.   docusate 50 MG/5ML liquid Commonly known as:  COLACE Take 10 mLs (100 mg total) by mouth 2  (two) times daily.   famotidine 40 MG/5ML suspension Commonly known as:  PEPCID Place 2.5 mLs (20 mg total) into feeding tube at bedtime.   levETIRAcetam 100 MG/ML solution Commonly known as:  KEPPRA Take 5 mLs (500 mg total) by mouth 2 (two) times daily.      Follow-up Information    FANTA,TESFAYE, MD.   Specialty:  Internal Medicine Contact information: [address] [place] [address] [phone]            The results of significant diagnostics from this hospitalization (including imaging, microbiology, ancillary and laboratory) are listed below for reference.     Microbiology: No results found for this or any previous visit (from the past 240 hour(s)).   Labs: Basic Metabolic Panel:  Recent Labs Lab 11/04/15 0525 11/07/15 0258  NA 135 135  K 4.3 4.3  CL 104 102  CO2 24 25  GLUCOSE 150* 134*  BUN 20 20  CREATININE 0.49 0.48  CALCIUM 10.0 9.8   CBC:  Recent Labs Lab 11/04/15 0525 11/07/15 0258  WBC 6.5 5.7  HGB 9.2*  8.6*  HCT 28.4* 27.2*  MCV 94.7 95.8  PLT 362 409*    BNP (last 3 results)  Recent Labs  11/25/14 2155 04/07/15 0145  BNP 202.0* 100.0   CBG:  Recent Labs Lab 11/09/15 1543 11/09/15 2040 11/09/15 2342 11/10/15 0336 11/10/15 0827  GLUCAP 140* 131* 110* 117* 122*    SIGNED: Time coordinating discharge: 30 minutes  MAGICK-MYERS, ISKRA, MD  [facility] 11/10/2015, 8:37 AM Pager [phone]  If 7PM-7AM, please contact night-coverage [email or web address] Password [email or web address]

## 2015-11-10 NOTE — Clinical Social Work Note (Signed)
Patient discharging to [facility] today. Patient no longer requiring SNF placement.   Clinical Social Worker will sign off for now as social work intervention is no longer needed. Please consult [place] again if new need arises.  Glendon Axe, MSW, LCSWA [phone] 11/10/2015 10:25 AM

## 2015-11-10 NOTE — Progress Notes (Signed)
NB: Please disregard previous note on patient's d/c. [facility]. Report called to the receiving RN.

## 2015-11-11 LAB — CBC WITH DIFFERENTIAL/PLATELET
Basophils Absolute: 0 10*3/uL (ref 0.0–0.1)
Basophils Relative: 0 %
EOS ABS: 0.1 10*3/uL (ref 0.0–0.7)
EOS PCT: 1 %
HCT: 29.7 % — ABNORMAL LOW (ref 36.0–46.0)
Hemoglobin: 9.5 g/dL — ABNORMAL LOW (ref 12.0–15.0)
LYMPHS ABS: 2.1 10*3/uL (ref 0.7–4.0)
LYMPHS PCT: 34 %
MCH: 30.3 pg (ref 26.0–34.0)
MCHC: 32 g/dL (ref 30.0–36.0)
MCV: 94.6 fL (ref 78.0–100.0)
MONO ABS: 0.5 10*3/uL (ref 0.1–1.0)
Monocytes Relative: 7 %
Neutro Abs: 3.6 10*3/uL (ref 1.7–7.7)
Neutrophils Relative %: 58 %
PLATELETS: 456 10*3/uL — AB (ref 150–400)
RBC: 3.14 MIL/uL — ABNORMAL LOW (ref 3.87–5.11)
RDW: 14.7 % (ref 11.5–15.5)
WBC: 6.2 10*3/uL (ref 4.0–10.5)

## 2015-11-11 LAB — COMPREHENSIVE METABOLIC PANEL
ALBUMIN: 2.7 g/dL — AB (ref 3.5–5.0)
ALK PHOS: 92 U/L (ref 38–126)
ALT: 66 U/L — ABNORMAL HIGH (ref 14–54)
ANION GAP: 9 (ref 5–15)
AST: 35 U/L (ref 15–41)
BILIRUBIN TOTAL: 0.4 mg/dL (ref 0.3–1.2)
BUN: 16 mg/dL (ref 6–20)
CALCIUM: 9.7 mg/dL (ref 8.9–10.3)
CO2: 25 mmol/L (ref 22–32)
Chloride: 101 mmol/L (ref 101–111)
Creatinine, Ser: 0.47 mg/dL (ref 0.44–1.00)
GFR calc Af Amer: >60 mL/min (ref >60)
GLUCOSE: 129 mg/dL — AB (ref 65–99)
POTASSIUM: 4.2 mmol/L (ref 3.5–5.1)
Sodium: 135 mmol/L (ref 135–145)
TOTAL PROTEIN: 7.6 g/dL (ref 6.5–8.1)

## 2015-11-11 LAB — PHOSPHORUS: Phosphorus: 5.2 mg/dL — ABNORMAL HIGH (ref 2.5–4.6)

## 2015-11-11 LAB — MAGNESIUM: MAGNESIUM: 1.9 mg/dL (ref 1.7–2.4)

## 2015-11-11 LAB — PROTIME-INR
INR: 1.02
Prothrombin Time: 13.5 seconds (ref 11.4–15.2)

## 2015-11-11 LAB — PROCALCITONIN: PROCALCITONIN: 0.17 ng/mL

## 2015-11-11 LAB — TSH: TSH: 1.235 u[IU]/mL (ref 0.350–4.500)

## 2015-11-12 LAB — CBC WITH DIFFERENTIAL/PLATELET
BASOS PCT: 1 %
Basophils Absolute: 0 10*3/uL (ref 0.0–0.1)
EOS ABS: 0.1 10*3/uL (ref 0.0–0.7)
EOS PCT: 1 %
HEMATOCRIT: 30.9 % — AB (ref 36.0–46.0)
HEMOGLOBIN: 9.8 g/dL — AB (ref 12.0–15.0)
LYMPHS ABS: 2.6 10*3/uL (ref 0.7–4.0)
Lymphocytes Relative: 39 %
MCH: 30.2 pg (ref 26.0–34.0)
MCHC: 31.7 g/dL (ref 30.0–36.0)
MCV: 95.4 fL (ref 78.0–100.0)
MONOS PCT: 6 %
Monocytes Absolute: 0.4 10*3/uL (ref 0.1–1.0)
Neutro Abs: 3.5 10*3/uL (ref 1.7–7.7)
Neutrophils Relative %: 53 %
Platelets: 375 10*3/uL (ref 150–400)
RBC: 3.24 MIL/uL — ABNORMAL LOW (ref 3.87–5.11)
RDW: 14.8 % (ref 11.5–15.5)
WBC: 6.6 10*3/uL (ref 4.0–10.5)

## 2015-11-12 LAB — BASIC METABOLIC PANEL
ANION GAP: 7 (ref 5–15)
BUN: 15 mg/dL (ref 6–20)
CHLORIDE: 100 mmol/L — AB (ref 101–111)
CO2: 26 mmol/L (ref 22–32)
Calcium: 9.8 mg/dL (ref 8.9–10.3)
Creatinine, Ser: 0.47 mg/dL (ref 0.44–1.00)
GFR calc Af Amer: >60 mL/min (ref >60)
GFR calc non Af Amer: >60 mL/min (ref >60)
GLUCOSE: 124 mg/dL — AB (ref 65–99)
POTASSIUM: 4.4 mmol/L (ref 3.5–5.1)
Sodium: 133 mmol/L — ABNORMAL LOW (ref 135–145)

## 2015-11-12 LAB — MAGNESIUM: Magnesium: 1.9 mg/dL (ref 1.7–2.4)

## 2015-11-12 LAB — PHOSPHORUS: PHOSPHORUS: 5.1 mg/dL — AB (ref 2.5–4.6)

## 2015-11-12 LAB — HEMOGLOBIN A1C
HEMOGLOBIN A1C: 6.1 % — AB (ref 4.8–5.6)
Mean Plasma Glucose: 128 mg/dL

## 2015-11-13 LAB — BASIC METABOLIC PANEL
Anion gap: 7 (ref 5–15)
BUN: 15 mg/dL (ref 6–20)
CHLORIDE: 101 mmol/L (ref 101–111)
CO2: 26 mmol/L (ref 22–32)
Calcium: 9.9 mg/dL (ref 8.9–10.3)
Creatinine, Ser: 0.53 mg/dL (ref 0.44–1.00)
GFR calc Af Amer: >60 mL/min (ref >60)
GFR calc non Af Amer: >60 mL/min (ref >60)
GLUCOSE: 124 mg/dL — AB (ref 65–99)
POTASSIUM: 4.2 mmol/L (ref 3.5–5.1)
Sodium: 134 mmol/L — ABNORMAL LOW (ref 135–145)

## 2015-11-15 LAB — CULTURE, RESPIRATORY W GRAM STAIN

## 2015-11-15 LAB — CULTURE, RESPIRATORY: CULTURE: NORMAL

## 2015-11-16 LAB — BASIC METABOLIC PANEL
ANION GAP: 7 (ref 5–15)
BUN: 17 mg/dL (ref 6–20)
CALCIUM: 10 mg/dL (ref 8.9–10.3)
CO2: 24 mmol/L (ref 22–32)
Chloride: 103 mmol/L (ref 101–111)
Creatinine, Ser: 0.46 mg/dL (ref 0.44–1.00)
GFR calc Af Amer: >60 mL/min (ref >60)
Glucose, Bld: 110 mg/dL — ABNORMAL HIGH (ref 65–99)
POTASSIUM: 4.4 mmol/L (ref 3.5–5.1)
SODIUM: 134 mmol/L — AB (ref 135–145)

## 2015-11-16 LAB — CBC
HEMATOCRIT: 29.6 % — AB (ref 36.0–46.0)
Hemoglobin: 9.3 g/dL — ABNORMAL LOW (ref 12.0–15.0)
MCH: 29.6 pg (ref 26.0–34.0)
MCHC: 31.4 g/dL (ref 30.0–36.0)
MCV: 94.3 fL (ref 78.0–100.0)
Platelets: 340 10*3/uL (ref 150–400)
RBC: 3.14 MIL/uL — ABNORMAL LOW (ref 3.87–5.11)
RDW: 15 % (ref 11.5–15.5)
WBC: 5.3 10*3/uL (ref 4.0–10.5)

## 2015-11-17 LAB — BASIC METABOLIC PANEL
ANION GAP: 8 (ref 5–15)
ANION GAP: 10 (ref 5–15)
BUN: 21 mg/dL — ABNORMAL HIGH (ref 6–20)
BUN: 21 mg/dL — ABNORMAL HIGH (ref 6–20)
CO2: 27 mmol/L (ref 22–32)
CO2: 27 mmol/L (ref 22–32)
Calcium: 10.4 mg/dL — ABNORMAL HIGH (ref 8.9–10.3)
Calcium: 10.7 mg/dL — ABNORMAL HIGH (ref 8.9–10.3)
Chloride: 99 mmol/L — ABNORMAL LOW (ref 101–111)
Chloride: 99 mmol/L — ABNORMAL LOW (ref 101–111)
Creatinine, Ser: 0.45 mg/dL (ref 0.44–1.00)
Creatinine, Ser: 0.49 mg/dL (ref 0.44–1.00)
GFR calc Af Amer: >60 mL/min (ref >60)
GLUCOSE: 139 mg/dL — AB (ref 65–99)
Glucose, Bld: 107 mg/dL — ABNORMAL HIGH (ref 65–99)
POTASSIUM: 4.1 mmol/L (ref 3.5–5.1)
POTASSIUM: 4.6 mmol/L (ref 3.5–5.1)
SODIUM: 134 mmol/L — AB (ref 135–145)
SODIUM: 136 mmol/L (ref 135–145)

## 2015-11-18 LAB — BASIC METABOLIC PANEL
ANION GAP: 10 (ref 5–15)
BUN: 26 mg/dL — ABNORMAL HIGH (ref 6–20)
CHLORIDE: 98 mmol/L — AB (ref 101–111)
CO2: 27 mmol/L (ref 22–32)
CREATININE: 0.52 mg/dL (ref 0.44–1.00)
Calcium: 10.5 mg/dL — ABNORMAL HIGH (ref 8.9–10.3)
GFR calc non Af Amer: >60 mL/min (ref >60)
Glucose, Bld: 132 mg/dL — ABNORMAL HIGH (ref 65–99)
Potassium: 4.3 mmol/L (ref 3.5–5.1)
SODIUM: 135 mmol/L (ref 135–145)

## 2015-11-19 LAB — BASIC METABOLIC PANEL
ANION GAP: 10 (ref 5–15)
BUN: 21 mg/dL — ABNORMAL HIGH (ref 6–20)
CALCIUM: 10.4 mg/dL — AB (ref 8.9–10.3)
CO2: 27 mmol/L (ref 22–32)
Chloride: 97 mmol/L — ABNORMAL LOW (ref 101–111)
Creatinine, Ser: 0.43 mg/dL — ABNORMAL LOW (ref 0.44–1.00)
GFR calc Af Amer: >60 mL/min (ref >60)
Glucose, Bld: 123 mg/dL — ABNORMAL HIGH (ref 65–99)
POTASSIUM: 4.3 mmol/L (ref 3.5–5.1)
SODIUM: 134 mmol/L — AB (ref 135–145)

## 2015-11-20 LAB — CBC WITH DIFFERENTIAL/PLATELET
Basophils Absolute: 0 10*3/uL (ref 0.0–0.1)
Basophils Relative: 1 %
EOS ABS: 0.1 10*3/uL (ref 0.0–0.7)
EOS PCT: 2 %
HCT: 33.4 % — ABNORMAL LOW (ref 36.0–46.0)
Hemoglobin: 10.7 g/dL — ABNORMAL LOW (ref 12.0–15.0)
LYMPHS ABS: 3.1 10*3/uL (ref 0.7–4.0)
LYMPHS PCT: 48 %
MCH: 30.1 pg (ref 26.0–34.0)
MCHC: 32 g/dL (ref 30.0–36.0)
MCV: 94.1 fL (ref 78.0–100.0)
MONO ABS: 0.7 10*3/uL (ref 0.1–1.0)
Monocytes Relative: 10 %
Neutro Abs: 2.4 10*3/uL (ref 1.7–7.7)
Neutrophils Relative %: 39 %
PLATELETS: 351 10*3/uL (ref 150–400)
RBC: 3.55 MIL/uL — AB (ref 3.87–5.11)
RDW: 15.2 % (ref 11.5–15.5)
WBC: 6.3 10*3/uL (ref 4.0–10.5)

## 2015-11-20 LAB — BASIC METABOLIC PANEL
Anion gap: 10 (ref 5–15)
BUN: 18 mg/dL (ref 6–20)
CHLORIDE: 97 mmol/L — AB (ref 101–111)
CO2: 27 mmol/L (ref 22–32)
CREATININE: 0.53 mg/dL (ref 0.44–1.00)
Calcium: 10.7 mg/dL — ABNORMAL HIGH (ref 8.9–10.3)
GFR calc Af Amer: >60 mL/min (ref >60)
GLUCOSE: 96 mg/dL (ref 65–99)
POTASSIUM: 4.6 mmol/L (ref 3.5–5.1)
SODIUM: 134 mmol/L — AB (ref 135–145)

## 2015-11-20 LAB — PHOSPHORUS: Phosphorus: 5 mg/dL — ABNORMAL HIGH (ref 2.5–4.6)

## 2015-11-20 LAB — MAGNESIUM: MAGNESIUM: 2 mg/dL (ref 1.7–2.4)

## 2015-11-21 LAB — BASIC METABOLIC PANEL
ANION GAP: 9 (ref 5–15)
BUN: 27 mg/dL — AB (ref 6–20)
CO2: 28 mmol/L (ref 22–32)
Calcium: 10.7 mg/dL — ABNORMAL HIGH (ref 8.9–10.3)
Chloride: 96 mmol/L — ABNORMAL LOW (ref 101–111)
Creatinine, Ser: 0.59 mg/dL (ref 0.44–1.00)
GFR calc Af Amer: >60 mL/min (ref >60)
GFR calc non Af Amer: >60 mL/min (ref >60)
GLUCOSE: 133 mg/dL — AB (ref 65–99)
POTASSIUM: 4.2 mmol/L (ref 3.5–5.1)
Sodium: 133 mmol/L — ABNORMAL LOW (ref 135–145)

## 2015-11-22 ENCOUNTER — Other Ambulatory Visit (HOSPITAL_COMMUNITY): Payer: Self-pay

## 2015-11-22 LAB — CBC
HCT: 35.6 % — ABNORMAL LOW (ref 36.0–46.0)
Hemoglobin: 11.5 g/dL — ABNORMAL LOW (ref 12.0–15.0)
MCH: 30.3 pg (ref 26.0–34.0)
MCHC: 32.3 g/dL (ref 30.0–36.0)
MCV: 93.9 fL (ref 78.0–100.0)
PLATELETS: 342 10*3/uL (ref 150–400)
RBC: 3.79 MIL/uL — ABNORMAL LOW (ref 3.87–5.11)
RDW: 15.2 % (ref 11.5–15.5)
WBC: 6.2 10*3/uL (ref 4.0–10.5)

## 2015-11-22 LAB — BASIC METABOLIC PANEL
Anion gap: 12 (ref 5–15)
BUN: 30 mg/dL — AB (ref 6–20)
CO2: 29 mmol/L (ref 22–32)
CREATININE: 0.53 mg/dL (ref 0.44–1.00)
Calcium: 11 mg/dL — ABNORMAL HIGH (ref 8.9–10.3)
Chloride: 95 mmol/L — ABNORMAL LOW (ref 101–111)
GFR calc Af Amer: >60 mL/min (ref >60)
GFR calc non Af Amer: >60 mL/min (ref >60)
GLUCOSE: 123 mg/dL — AB (ref 65–99)
Potassium: 4.1 mmol/L (ref 3.5–5.1)
Sodium: 136 mmol/L (ref 135–145)

## 2015-11-24 LAB — BASIC METABOLIC PANEL
ANION GAP: 11 (ref 5–15)
BUN: 33 mg/dL — AB (ref 6–20)
CALCIUM: 10.4 mg/dL — AB (ref 8.9–10.3)
CO2: 31 mmol/L (ref 22–32)
Chloride: 92 mmol/L — ABNORMAL LOW (ref 101–111)
Creatinine, Ser: 0.57 mg/dL (ref 0.44–1.00)
GFR calc Af Amer: >60 mL/min (ref >60)
GLUCOSE: 126 mg/dL — AB (ref 65–99)
Potassium: 4.2 mmol/L (ref 3.5–5.1)
Sodium: 134 mmol/L — ABNORMAL LOW (ref 135–145)

## 2015-11-24 LAB — CBC
HCT: 35.1 % — ABNORMAL LOW (ref 36.0–46.0)
Hemoglobin: 11.2 g/dL — ABNORMAL LOW (ref 12.0–15.0)
MCH: 29.9 pg (ref 26.0–34.0)
MCHC: 31.9 g/dL (ref 30.0–36.0)
MCV: 93.9 fL (ref 78.0–100.0)
PLATELETS: 327 10*3/uL (ref 150–400)
RBC: 3.74 MIL/uL — ABNORMAL LOW (ref 3.87–5.11)
RDW: 15.4 % (ref 11.5–15.5)
WBC: 5.9 10*3/uL (ref 4.0–10.5)

## 2015-11-24 LAB — PHOSPHORUS: Phosphorus: 5.7 mg/dL — ABNORMAL HIGH (ref 2.5–4.6)

## 2015-11-24 LAB — MAGNESIUM: Magnesium: 2.1 mg/dL (ref 1.7–2.4)

## 2016-04-16 DEATH — deceased

## 2016-07-27 IMAGING — CT CT HEAD W/O CM
4 series · 15 of 47 positions shown, 17 images · non-contrast
Comparison: Prior CT from 11/01/2015.

CLINICAL DATA: Follow-up exam status post

EXAM:
CT HEAD WITHOUT CONTRAST
TECHNIQUE: Contiguous axial images were obtained from the base of the skull
through the vertex without intravenous contrast.

[Series 2: head without · axial · non-contrast · 0.46mm/px · z∈[-100,+20]mm · 7 of 33 slices shown, 9 images]
[im 5/33  brain]
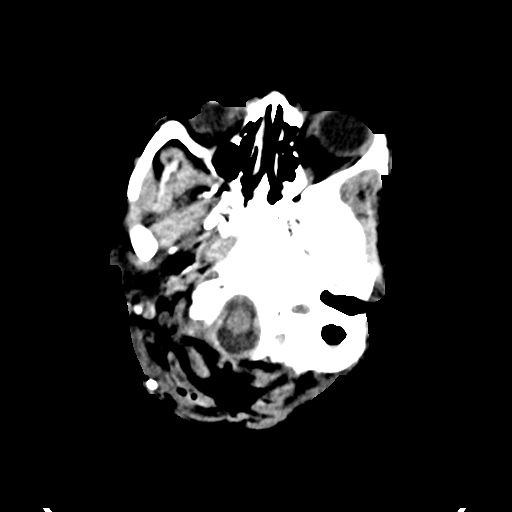
[im 5/33  bone]
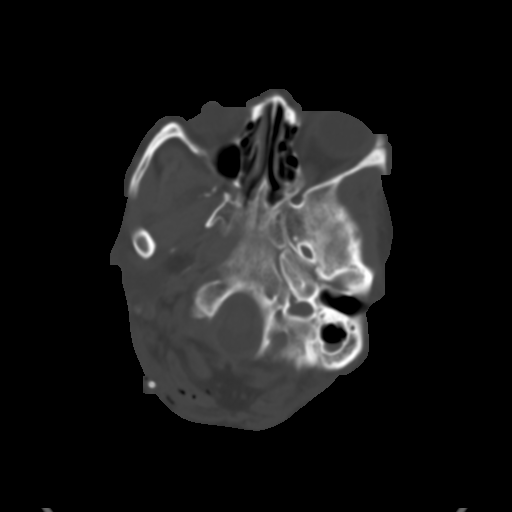
[im 9/33  brain]
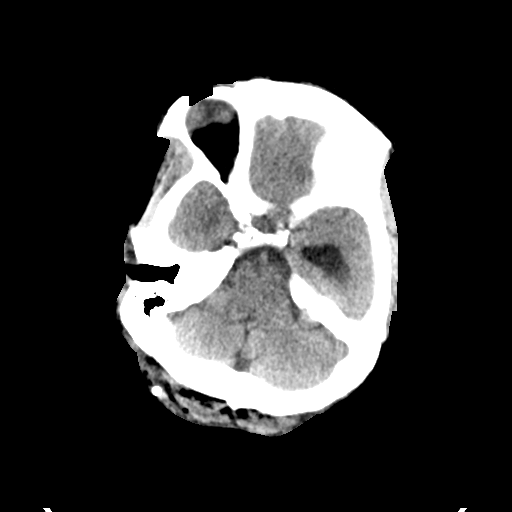
[im 13/33  brain]
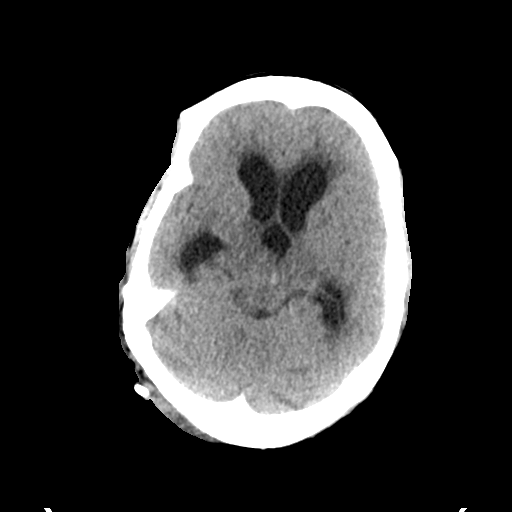
[im 17/33  brain]
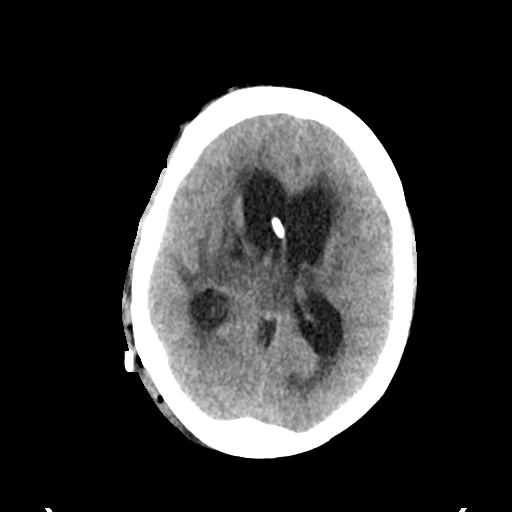
[im 21/33  brain]
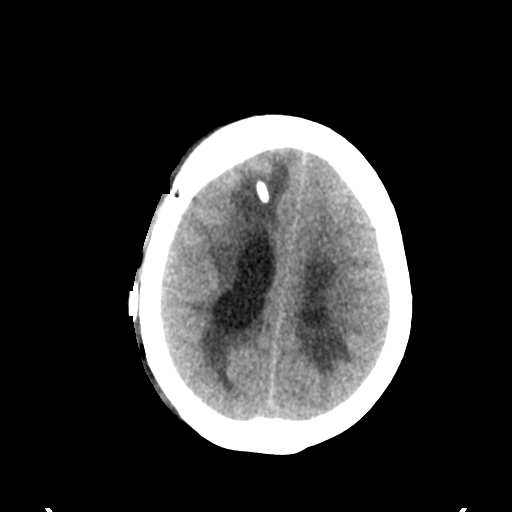
[im 21/33  bone]
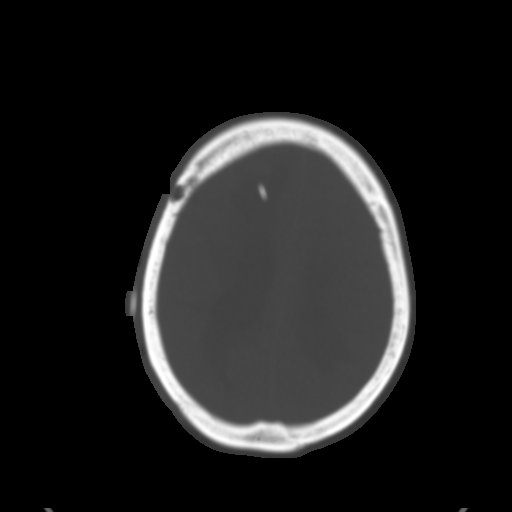
[im 25/33  brain]
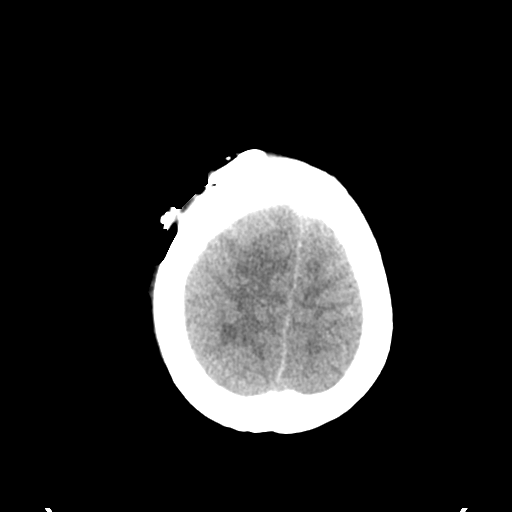
[im 29/33  brain]
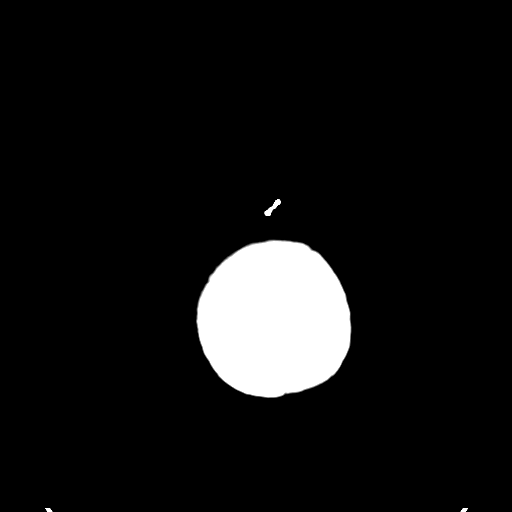

[Series 3: head bone · axial · 0.46mm/px · z∈[-104,-88]mm · 2 of 81 slices shown]
[im 9/81  bone]
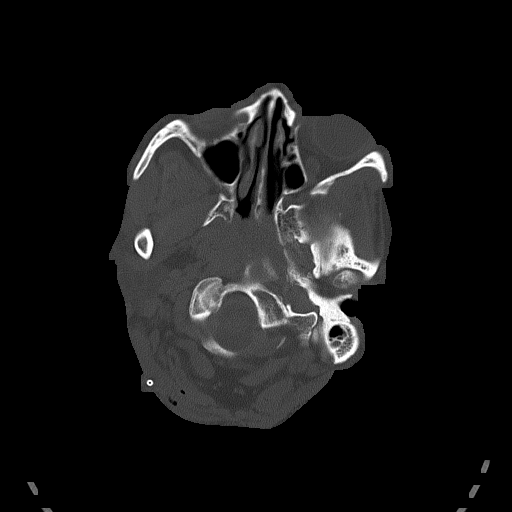
[im 17/81  bone]
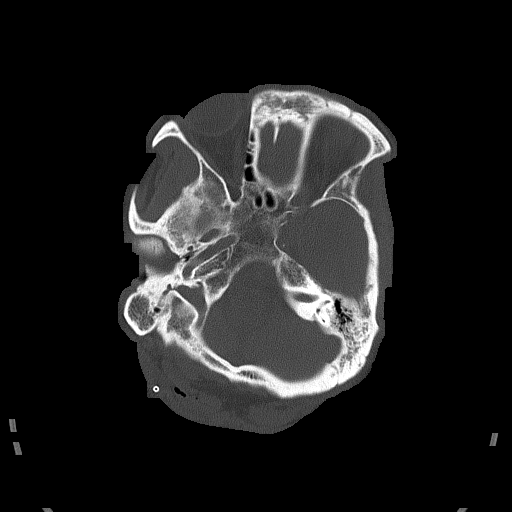

[Series 4: head without cor · coronal · non-contrast · 0.31mm/px · 3 of 67 slices shown]
[im 23/67  brain]
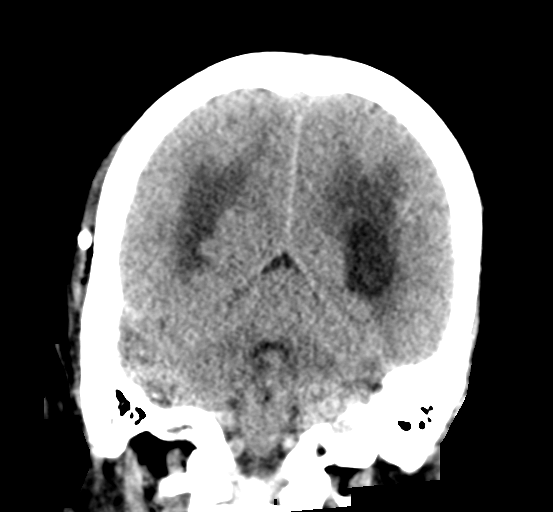
[im 30/67  brain]
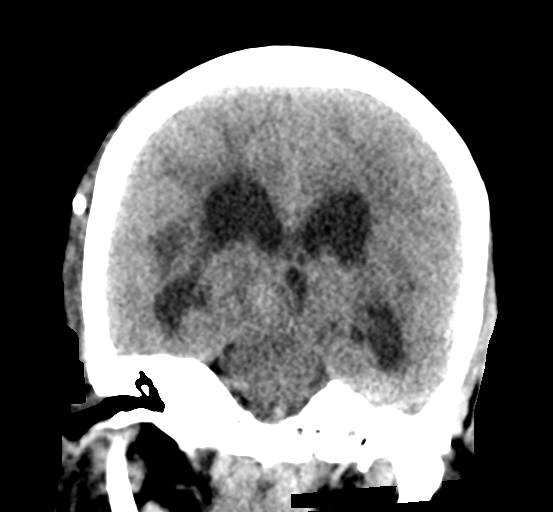
[im 37/67  brain]
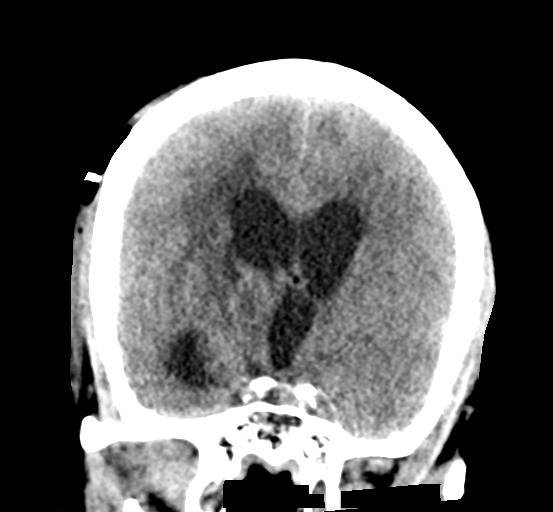

[Series 5: head without sag · sagittal · non-contrast · 0.31mm/px · 3 of 51 slices shown]
[im 18/51  brain]
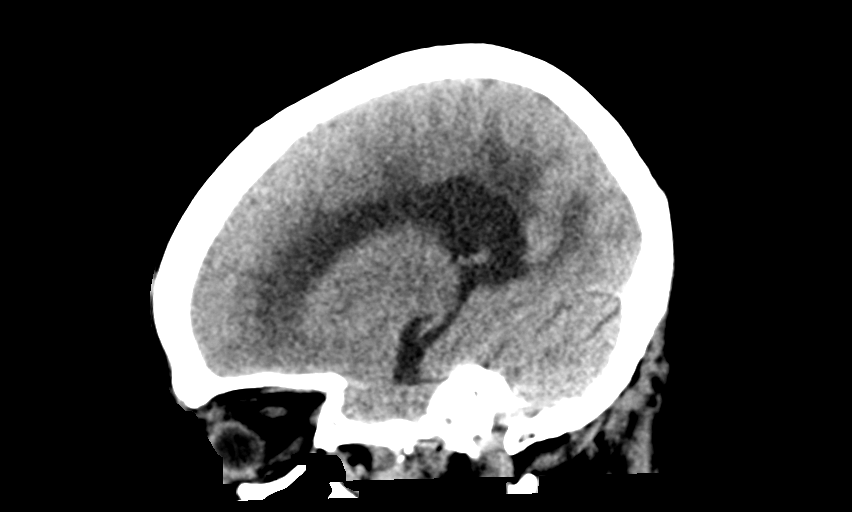
[im 26/51  brain]
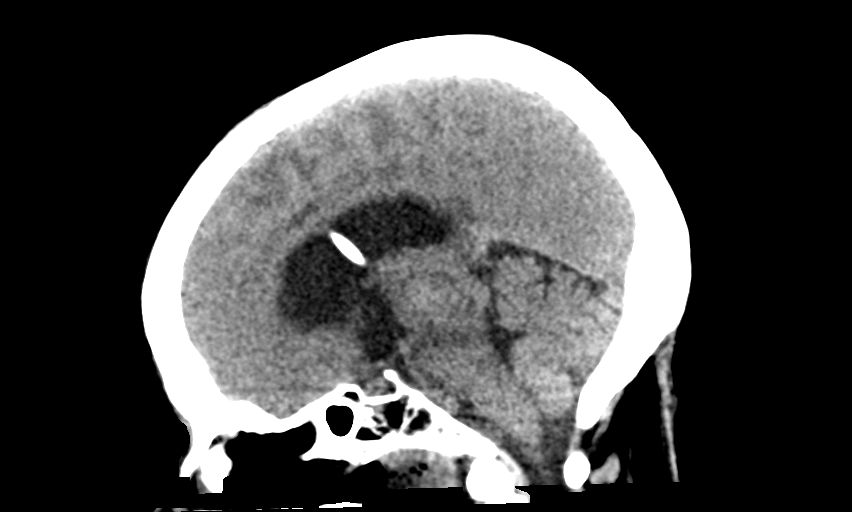
[im 34/51  brain]
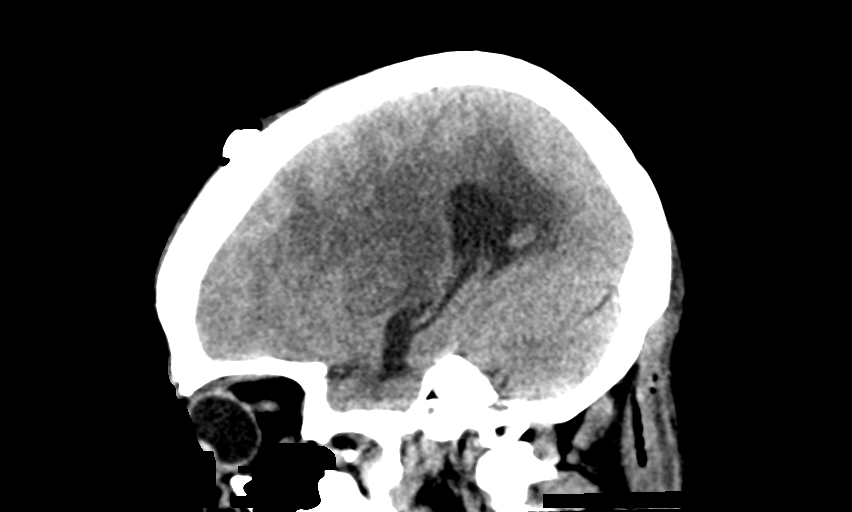

[15 of 47 positions shown; findings below may reference images not displayed]

FINDINGS: Postoperative changes from interval VP shunt internalization are
present. Skin staples at the right frontal scalp. Soft tissue
emphysema about the reservoir along the shunt catheter within the
right scalp and neck. Scalp soft tissues otherwise within normal
limits.

No acute abnormality about the globes and orbits.

Visualized paranasal sinuses are largely clear. Small left mastoid
effusion, stable. Right mastoid air cells clear.

Right frontal approach shunt catheter in place with tip at the
foramen of [place]. Hypodensity along the catheter tract within the
right frontal lobe is similar. Overall, lateral and third
ventricular dilatation appears slightly improved from previous.
Persistent hypodensity about the ventricles likely reflects
persistent transependymal flow of CSF.

Degenerating hemorrhage centered at the right thalamus again seen,
stable. Remote lacunar infarct at the right basal ganglia also
noted.

No new intracranial hemorrhage. No extra-axial fluid collection. No
acute infarct.
IMPRESSION: 1. Sequela of interval VP shunt internalization. Right frontal
approach shunt catheter tip terminates near the foramen of [place].
Lateral and third ventricular dilatation slightly improved relative
to 11/01/2015.
2. Continued interval evolution of right thalamic hemorrhage.

## 2016-08-04 IMAGING — CR DG CHEST 1V PORT
1 series · 1 of 1 positions shown · non-contrast
Comparison: 10/21/2015

CLINICAL DATA: Respiratory failure.

EXAM:
PORTABLE CHEST 1 VIEW

[AP]
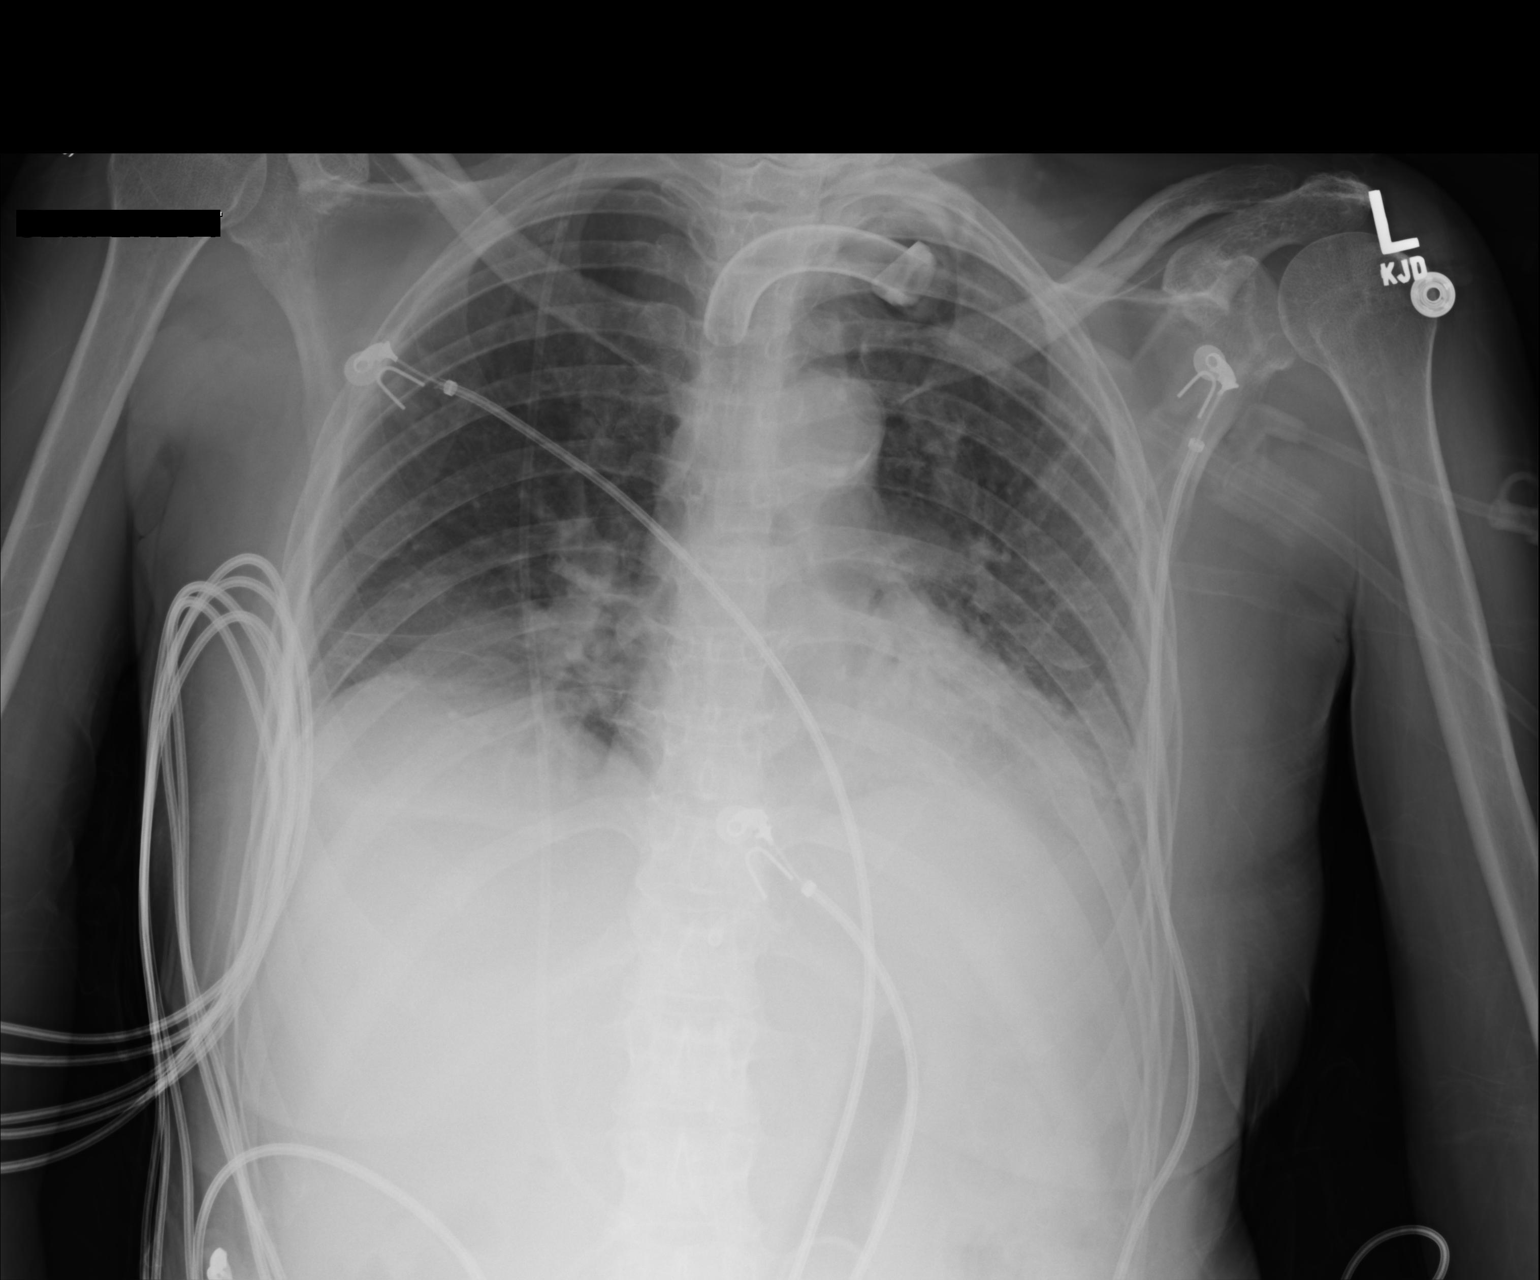

[1 of 1 positions shown; findings below may reference images not displayed]

FINDINGS: Opacity at the lung bases has mildly increased from the prior study.
This may be due to a combination pleural fluid and atelectasis.
Superimposed pneumonia should be considered if there are consistent
clinical symptoms.

No evidence of pulmonary edema.  No pneumothorax.

Cardiac silhouette is normal in size. No mediastinal or hilar
masses.

Tracheostomy tube is stable and well positioned.

An apparent ventriculoperitoneal shunt crosses the right anterior
chest wall, new since the prior exam.
IMPRESSION: 1. Increased lung base opacity since the prior study which may
reflect an increase in atelectasis and associated small effusions.
Pneumonia should be considered if there are consistent clinical
symptoms. No evidence of pulmonary edema. No pneumothorax.

## 2016-08-16 IMAGING — DX DG CHEST 1V PORT
1 series · 1 of 1 positions shown · non-contrast
Comparison: 11/10/2015.

CLINICAL DATA: Pneumonia.

EXAM:
PORTABLE CHEST 1 VIEW

[chest ap]
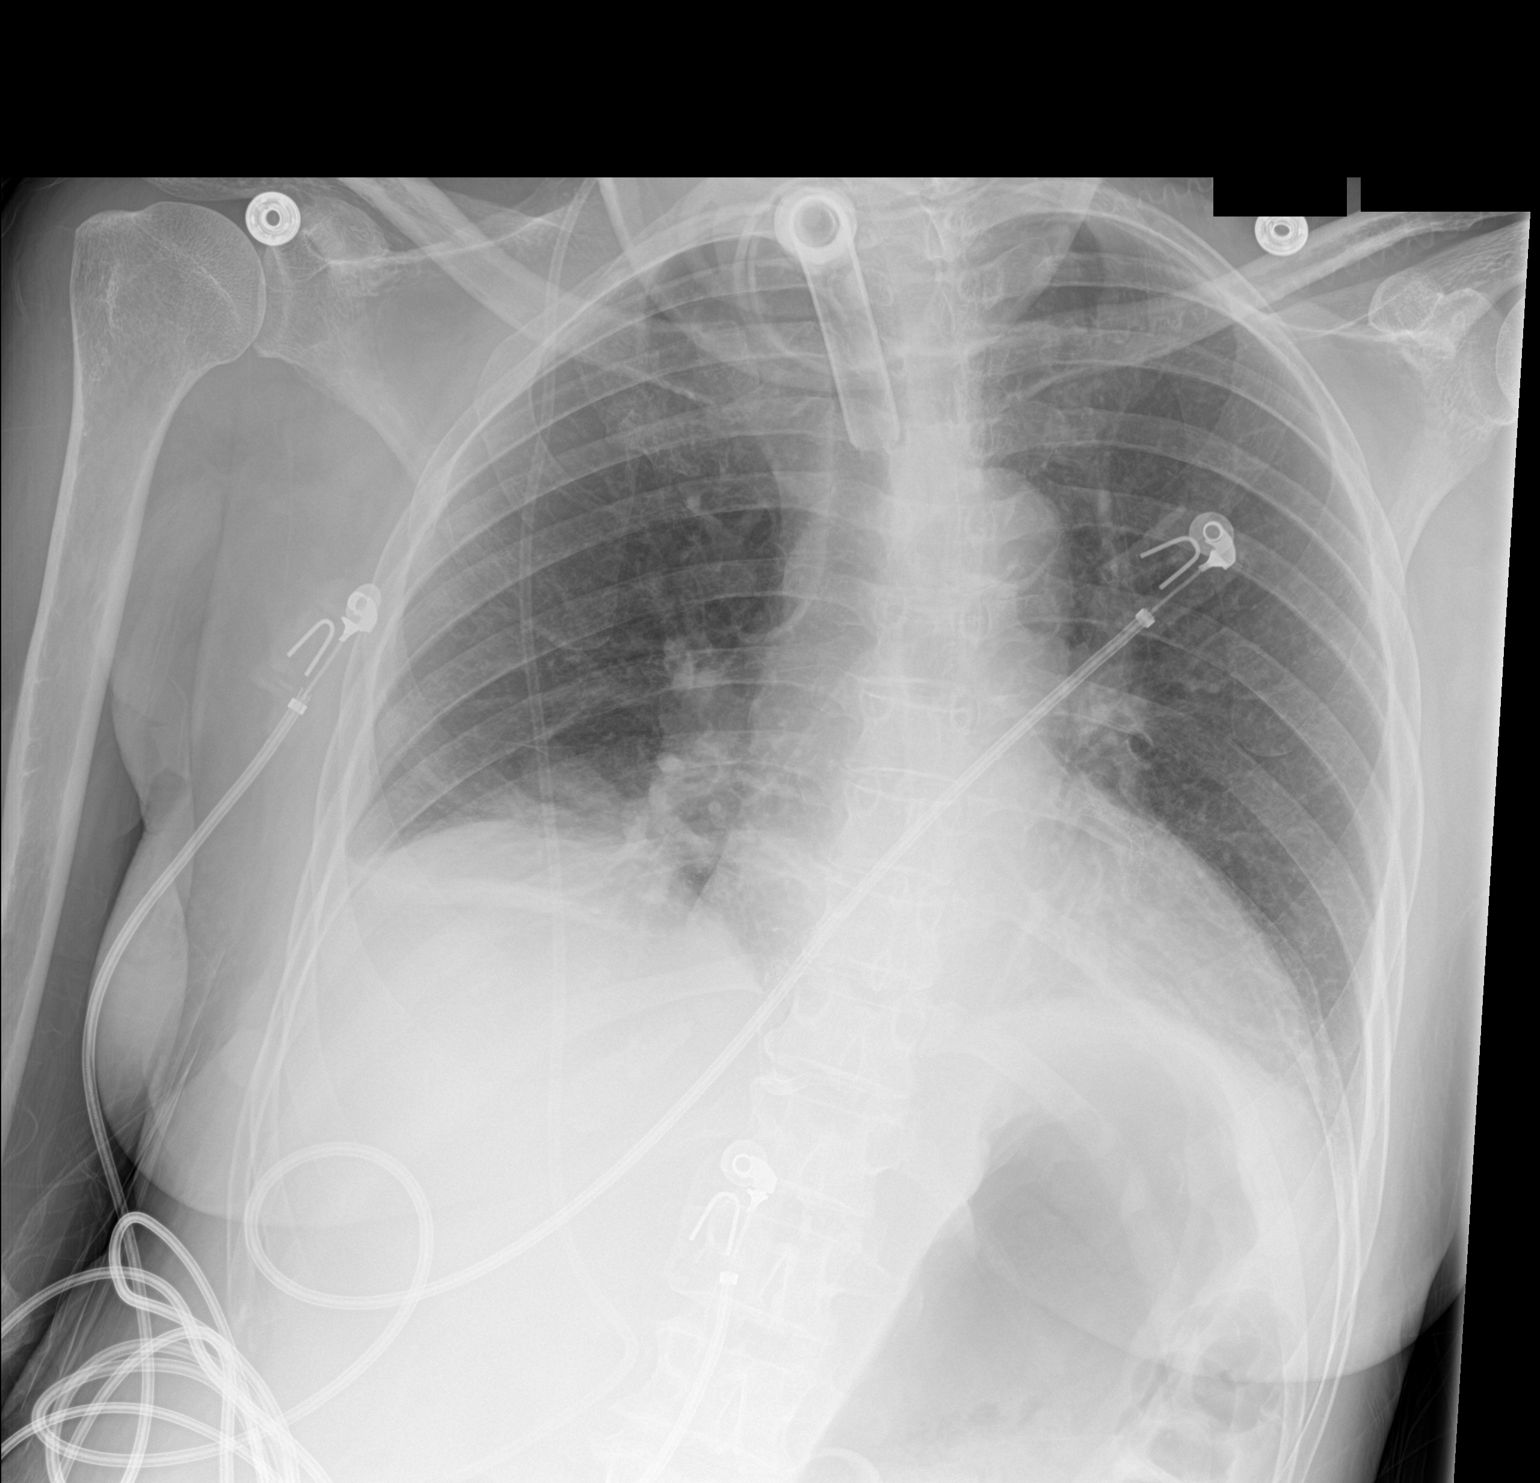

[1 of 1 positions shown; findings below may reference images not displayed]

FINDINGS: Tracheostomy tube in stable position. Heart size stable. Persistent
bibasilar atelectasis and/or infiltrates noted. Slight interim
improvement. No prominent pleural effusion or pneumothorax .
IMPRESSION: 1. Tracheostomy tube in stable position.

2.Slight interim improvement of bibasilar atelectasis and/or
infiltrates .

## 2020-06-09 ENCOUNTER — Encounter (INDEPENDENT_AMBULATORY_CARE_PROVIDER_SITE_OTHER): Payer: Self-pay | Admitting: *Deleted
